# Patient Record
Sex: Female | Born: 1945
Health system: Southern US, Community
[De-identification: ages and names within clinical notes are randomized; demographics above are authoritative.]

## PROBLEM LIST (undated history)

## (undated) DIAGNOSIS — D099 Carcinoma in situ, unspecified: Secondary | ICD-10-CM

## (undated) DIAGNOSIS — R569 Unspecified convulsions: Secondary | ICD-10-CM

## (undated) DIAGNOSIS — D6800 Von Willebrand disease, unspecified: Secondary | ICD-10-CM

## (undated) DIAGNOSIS — R413 Other amnesia: Secondary | ICD-10-CM

## (undated) DIAGNOSIS — F419 Anxiety disorder, unspecified: Secondary | ICD-10-CM

## (undated) DIAGNOSIS — K219 Gastro-esophageal reflux disease without esophagitis: Secondary | ICD-10-CM

## (undated) DIAGNOSIS — M1990 Unspecified osteoarthritis, unspecified site: Secondary | ICD-10-CM

## (undated) DIAGNOSIS — M7541 Impingement syndrome of right shoulder: Secondary | ICD-10-CM

## (undated) DIAGNOSIS — E039 Hypothyroidism, unspecified: Secondary | ICD-10-CM

## (undated) DIAGNOSIS — E0590 Thyrotoxicosis, unspecified without thyrotoxic crisis or storm: Secondary | ICD-10-CM

## (undated) DIAGNOSIS — E785 Hyperlipidemia, unspecified: Secondary | ICD-10-CM

## (undated) DIAGNOSIS — R159 Full incontinence of feces: Secondary | ICD-10-CM

## (undated) DIAGNOSIS — M75101 Unspecified rotator cuff tear or rupture of right shoulder, not specified as traumatic: Secondary | ICD-10-CM

## (undated) DIAGNOSIS — F0390 Unspecified dementia without behavioral disturbance: Secondary | ICD-10-CM

## (undated) DIAGNOSIS — Z85828 Personal history of other malignant neoplasm of skin: Secondary | ICD-10-CM

## (undated) DIAGNOSIS — C4491 Basal cell carcinoma of skin, unspecified: Secondary | ICD-10-CM

## (undated) DIAGNOSIS — M75100 Unspecified rotator cuff tear or rupture of unspecified shoulder, not specified as traumatic: Secondary | ICD-10-CM

## (undated) DIAGNOSIS — I10 Essential (primary) hypertension: Secondary | ICD-10-CM

## (undated) DIAGNOSIS — R51 Headache: Secondary | ICD-10-CM

## (undated) DIAGNOSIS — Z98811 Dental restoration status: Secondary | ICD-10-CM

## (undated) DIAGNOSIS — R42 Dizziness and giddiness: Secondary | ICD-10-CM

## (undated) DIAGNOSIS — M549 Dorsalgia, unspecified: Secondary | ICD-10-CM

## (undated) DIAGNOSIS — M24119 Other articular cartilage disorders, unspecified shoulder: Secondary | ICD-10-CM

## (undated) HISTORY — DX: Hyperlipidemia, unspecified: E78.5

## (undated) HISTORY — DX: Essential (primary) hypertension: I10

## (undated) HISTORY — DX: Dorsalgia, unspecified: M54.9

## (undated) HISTORY — PX: ORIF WRIST FRACTURE: SHX2133

## (undated) HISTORY — PX: BREAST SURGERY: SHX581

## (undated) HISTORY — DX: Hypothyroidism, unspecified: E03.9

## (undated) HISTORY — DX: Carcinoma in situ, unspecified: D09.9

## (undated) HISTORY — DX: Basal cell carcinoma of skin, unspecified: C44.91

## (undated) HISTORY — PX: APPENDECTOMY: SHX54

## (undated) HISTORY — DX: Von Willebrand disease, unspecified: D68.00

## (undated) HISTORY — PX: COLONOSCOPY: SHX174

## (undated) HISTORY — DX: Dizziness and giddiness: R42

## (undated) HISTORY — DX: Unspecified dementia, unspecified severity, without behavioral disturbance, psychotic disturbance, mood disturbance, and anxiety: F03.90

## (undated) HISTORY — DX: Gastro-esophageal reflux disease without esophagitis: K21.9

## (undated) HISTORY — PX: TUBAL LIGATION: SHX77

## (undated) HISTORY — DX: Full incontinence of feces: R15.9

## (undated) HISTORY — DX: Unspecified osteoarthritis, unspecified site: M19.90

## (undated) HISTORY — DX: Unspecified convulsions: R56.9

## (undated) HISTORY — PX: FRACTURE SURGERY: SHX138

## (undated) HISTORY — DX: Von Willebrand's disease: D68.0

---

## 1997-12-14 ENCOUNTER — Emergency Department (HOSPITAL_COMMUNITY): Admission: EM | Admit: 1997-12-14 | Discharge: 1997-12-14 | Payer: Self-pay | Admitting: Emergency Medicine

## 1998-06-14 ENCOUNTER — Other Ambulatory Visit: Admission: RE | Admit: 1998-06-14 | Discharge: 1998-06-14 | Payer: Self-pay | Admitting: Obstetrics and Gynecology

## 1998-07-17 ENCOUNTER — Ambulatory Visit (HOSPITAL_BASED_OUTPATIENT_CLINIC_OR_DEPARTMENT_OTHER): Admission: RE | Admit: 1998-07-17 | Discharge: 1998-07-17 | Payer: Self-pay | Admitting: Surgery

## 1998-12-04 ENCOUNTER — Ambulatory Visit (HOSPITAL_COMMUNITY): Admission: RE | Admit: 1998-12-04 | Discharge: 1998-12-04 | Payer: Self-pay

## 1999-06-20 ENCOUNTER — Other Ambulatory Visit: Admission: RE | Admit: 1999-06-20 | Discharge: 1999-06-20 | Payer: Self-pay | Admitting: Obstetrics and Gynecology

## 1999-07-24 ENCOUNTER — Encounter: Admission: RE | Admit: 1999-07-24 | Discharge: 1999-07-24 | Payer: Self-pay | Admitting: Obstetrics and Gynecology

## 1999-07-24 ENCOUNTER — Encounter: Payer: Self-pay | Admitting: Obstetrics and Gynecology

## 2000-07-29 ENCOUNTER — Other Ambulatory Visit: Admission: RE | Admit: 2000-07-29 | Discharge: 2000-07-29 | Payer: Self-pay | Admitting: Obstetrics and Gynecology

## 2000-08-12 ENCOUNTER — Encounter: Payer: Self-pay | Admitting: Obstetrics and Gynecology

## 2000-08-12 ENCOUNTER — Encounter: Admission: RE | Admit: 2000-08-12 | Discharge: 2000-08-12 | Payer: Self-pay | Admitting: Obstetrics and Gynecology

## 2001-03-06 ENCOUNTER — Observation Stay (HOSPITAL_COMMUNITY): Admission: EM | Admit: 2001-03-06 | Discharge: 2001-03-06 | Payer: Self-pay

## 2001-03-06 ENCOUNTER — Encounter: Payer: Self-pay | Admitting: Cardiology

## 2001-03-06 HISTORY — PX: CARDIAC CATHETERIZATION: SHX172

## 2001-08-13 ENCOUNTER — Encounter: Payer: Self-pay | Admitting: Obstetrics and Gynecology

## 2001-08-13 ENCOUNTER — Encounter: Admission: RE | Admit: 2001-08-13 | Discharge: 2001-08-13 | Payer: Self-pay | Admitting: Obstetrics and Gynecology

## 2001-08-20 ENCOUNTER — Other Ambulatory Visit: Admission: RE | Admit: 2001-08-20 | Discharge: 2001-08-20 | Payer: Self-pay | Admitting: Obstetrics and Gynecology

## 2002-12-17 ENCOUNTER — Encounter: Payer: Self-pay | Admitting: Obstetrics and Gynecology

## 2002-12-17 ENCOUNTER — Encounter: Admission: RE | Admit: 2002-12-17 | Discharge: 2002-12-17 | Payer: Self-pay | Admitting: Obstetrics and Gynecology

## 2004-01-27 ENCOUNTER — Ambulatory Visit (HOSPITAL_COMMUNITY): Admission: RE | Admit: 2004-01-27 | Discharge: 2004-01-27 | Payer: Self-pay | Admitting: Obstetrics and Gynecology

## 2004-08-17 ENCOUNTER — Ambulatory Visit (HOSPITAL_COMMUNITY): Admission: RE | Admit: 2004-08-17 | Discharge: 2004-08-17 | Payer: Self-pay

## 2004-11-29 ENCOUNTER — Ambulatory Visit (HOSPITAL_COMMUNITY): Admission: RE | Admit: 2004-11-29 | Discharge: 2004-11-29 | Payer: Self-pay

## 2005-02-13 ENCOUNTER — Ambulatory Visit (HOSPITAL_COMMUNITY): Admission: RE | Admit: 2005-02-13 | Discharge: 2005-02-13 | Payer: Self-pay | Admitting: Obstetrics and Gynecology

## 2005-07-15 ENCOUNTER — Emergency Department (HOSPITAL_COMMUNITY): Admission: EM | Admit: 2005-07-15 | Discharge: 2005-07-15 | Payer: Self-pay | Admitting: Emergency Medicine

## 2005-12-20 ENCOUNTER — Ambulatory Visit: Payer: Self-pay | Admitting: Internal Medicine

## 2006-01-10 ENCOUNTER — Encounter (INDEPENDENT_AMBULATORY_CARE_PROVIDER_SITE_OTHER): Payer: Self-pay | Admitting: *Deleted

## 2006-01-10 ENCOUNTER — Ambulatory Visit: Payer: Self-pay | Admitting: Internal Medicine

## 2006-01-13 ENCOUNTER — Ambulatory Visit: Payer: Self-pay | Admitting: Internal Medicine

## 2006-04-15 ENCOUNTER — Ambulatory Visit (HOSPITAL_COMMUNITY): Admission: RE | Admit: 2006-04-15 | Discharge: 2006-04-15 | Payer: Self-pay | Admitting: Obstetrics and Gynecology

## 2006-12-30 ENCOUNTER — Ambulatory Visit (HOSPITAL_COMMUNITY): Admission: RE | Admit: 2006-12-30 | Discharge: 2006-12-30 | Payer: Self-pay

## 2007-05-06 ENCOUNTER — Ambulatory Visit (HOSPITAL_COMMUNITY): Admission: RE | Admit: 2007-05-06 | Discharge: 2007-05-06 | Payer: Self-pay | Admitting: Obstetrics and Gynecology

## 2007-06-10 ENCOUNTER — Encounter: Admission: RE | Admit: 2007-06-10 | Discharge: 2007-06-10 | Payer: Self-pay | Admitting: Family Medicine

## 2007-11-05 ENCOUNTER — Encounter: Admission: RE | Admit: 2007-11-05 | Discharge: 2007-11-05 | Payer: Self-pay | Admitting: Family Medicine

## 2008-04-29 ENCOUNTER — Ambulatory Visit (HOSPITAL_COMMUNITY): Admission: RE | Admit: 2008-04-29 | Discharge: 2008-04-29 | Payer: Self-pay | Admitting: Obstetrics and Gynecology

## 2008-07-29 ENCOUNTER — Encounter: Admission: RE | Admit: 2008-07-29 | Discharge: 2008-07-29 | Payer: Self-pay | Admitting: Sports Medicine

## 2009-01-10 ENCOUNTER — Encounter: Admission: RE | Admit: 2009-01-10 | Discharge: 2009-01-10 | Payer: Self-pay | Admitting: Sports Medicine

## 2009-01-12 ENCOUNTER — Encounter: Admission: RE | Admit: 2009-01-12 | Discharge: 2009-01-12 | Payer: Self-pay | Admitting: Sports Medicine

## 2009-01-25 ENCOUNTER — Encounter: Admission: RE | Admit: 2009-01-25 | Discharge: 2009-01-25 | Payer: Self-pay | Admitting: Neurological Surgery

## 2009-02-16 ENCOUNTER — Observation Stay (HOSPITAL_COMMUNITY): Admission: RE | Admit: 2009-02-16 | Discharge: 2009-02-16 | Payer: Self-pay | Admitting: Neurological Surgery

## 2009-02-22 ENCOUNTER — Ambulatory Visit: Payer: Self-pay | Admitting: Hematology & Oncology

## 2009-02-23 LAB — CBC WITH DIFFERENTIAL (CANCER CENTER ONLY)
BASO#: 0.1 10*3/uL (ref 0.0–0.2)
BASO%: 0.8 % (ref 0.0–2.0)
EOS%: 2.1 % (ref 0.0–7.0)
Eosinophils Absolute: 0.2 10*3/uL (ref 0.0–0.5)
HCT: 34.7 % — ABNORMAL LOW (ref 34.8–46.6)
HGB: 12.1 g/dL (ref 11.6–15.9)
LYMPH#: 2.5 10*3/uL (ref 0.9–3.3)
LYMPH%: 28.5 % (ref 14.0–48.0)
MCH: 28.1 pg (ref 26.0–34.0)
MCHC: 35 g/dL (ref 32.0–36.0)
MCV: 80 fL — ABNORMAL LOW (ref 81–101)
MONO#: 0.5 10*3/uL (ref 0.1–0.9)
MONO%: 5.3 % (ref 0.0–13.0)
NEUT#: 5.5 10*3/uL (ref 1.5–6.5)
NEUT%: 63.3 % (ref 39.6–80.0)
Platelets: 273 10*3/uL (ref 145–400)
RBC: 4.31 10*6/uL (ref 3.70–5.32)
RDW: 14.9 % — ABNORMAL HIGH (ref 10.5–14.6)
WBC: 8.7 10*3/uL (ref 3.9–10.0)

## 2009-02-23 LAB — IVY BLEEDING TIME - CHCC SATELLITE: Bleeding Time: 7 Minutes (ref 2.0–8.0)

## 2009-03-01 ENCOUNTER — Inpatient Hospital Stay (HOSPITAL_COMMUNITY): Admission: RE | Admit: 2009-03-01 | Discharge: 2009-03-02 | Payer: Self-pay | Admitting: Neurological Surgery

## 2009-03-01 HISTORY — PX: LUMBAR LAMINECTOMY/DECOMPRESSION MICRODISCECTOMY: SHX5026

## 2009-03-01 LAB — VON WILLEBRAND FACTOR MULTIMER
Factor-VIII Activity: 73 % (ref 50–180)
Ristocetin Co-Factor: 71 % (ref 42–200)
Von Willebrand Factor Ag: 105 % (ref 50–217)

## 2009-03-01 LAB — APTT: aPTT: 34 seconds (ref 24–37)

## 2009-04-25 ENCOUNTER — Encounter: Admission: RE | Admit: 2009-04-25 | Discharge: 2009-04-25 | Payer: Self-pay | Admitting: Neurological Surgery

## 2009-06-19 ENCOUNTER — Encounter: Admission: RE | Admit: 2009-06-19 | Discharge: 2009-06-19 | Payer: Self-pay | Admitting: Neurological Surgery

## 2009-10-02 ENCOUNTER — Encounter: Admission: RE | Admit: 2009-10-02 | Discharge: 2009-10-02 | Payer: Self-pay | Admitting: Neurological Surgery

## 2009-12-05 ENCOUNTER — Ambulatory Visit (HOSPITAL_COMMUNITY): Admission: RE | Admit: 2009-12-05 | Discharge: 2009-12-05 | Payer: Self-pay | Admitting: Obstetrics and Gynecology

## 2010-02-05 ENCOUNTER — Encounter: Admission: RE | Admit: 2010-02-05 | Discharge: 2010-02-05 | Payer: Self-pay | Admitting: Neurological Surgery

## 2010-03-19 ENCOUNTER — Encounter: Admission: RE | Admit: 2010-03-19 | Discharge: 2010-03-19 | Payer: Self-pay | Admitting: Neurological Surgery

## 2010-03-20 ENCOUNTER — Encounter: Admission: RE | Admit: 2010-03-20 | Discharge: 2010-03-20 | Payer: Self-pay | Admitting: Obstetrics and Gynecology

## 2010-06-17 ENCOUNTER — Encounter: Payer: Self-pay | Admitting: Obstetrics and Gynecology

## 2010-06-18 ENCOUNTER — Encounter: Payer: Self-pay | Admitting: Sports Medicine

## 2010-08-31 LAB — DIFFERENTIAL
Basophils Absolute: 0 10*3/uL (ref 0.0–0.1)
Basophils Relative: 0 % (ref 0–1)
Eosinophils Absolute: 0.1 10*3/uL (ref 0.0–0.7)
Eosinophils Relative: 2 % (ref 0–5)
Lymphocytes Relative: 31 % (ref 12–46)
Lymphs Abs: 2.5 10*3/uL (ref 0.7–4.0)
Monocytes Absolute: 0.5 10*3/uL (ref 0.1–1.0)
Monocytes Relative: 6 % (ref 3–12)
Neutro Abs: 5.1 10*3/uL (ref 1.7–7.7)
Neutrophils Relative %: 62 % (ref 43–77)

## 2010-08-31 LAB — GLUCOSE, CAPILLARY
Glucose-Capillary: 107 mg/dL — ABNORMAL HIGH (ref 70–99)
Glucose-Capillary: 114 mg/dL — ABNORMAL HIGH (ref 70–99)
Glucose-Capillary: 121 mg/dL — ABNORMAL HIGH (ref 70–99)
Glucose-Capillary: 151 mg/dL — ABNORMAL HIGH (ref 70–99)
Glucose-Capillary: 157 mg/dL — ABNORMAL HIGH (ref 70–99)

## 2010-08-31 LAB — BASIC METABOLIC PANEL
BUN: 11 mg/dL (ref 6–23)
BUN: 21 mg/dL (ref 6–23)
CO2: 28 mEq/L (ref 19–32)
CO2: 28 mEq/L (ref 19–32)
Calcium: 9.7 mg/dL (ref 8.4–10.5)
Calcium: 9.7 mg/dL (ref 8.4–10.5)
Chloride: 104 mEq/L (ref 96–112)
Chloride: 105 mEq/L (ref 96–112)
Creatinine, Ser: 0.64 mg/dL (ref 0.4–1.2)
Creatinine, Ser: 0.73 mg/dL (ref 0.4–1.2)
GFR calc Af Amer: >60 mL/min (ref >60)
GFR calc Af Amer: >60 mL/min (ref >60)
GFR calc non Af Amer: >60 mL/min (ref >60)
GFR calc non Af Amer: >60 mL/min (ref >60)
Glucose, Bld: 100 mg/dL — ABNORMAL HIGH (ref 70–99)
Glucose, Bld: 116 mg/dL — ABNORMAL HIGH (ref 70–99)
Potassium: 3.9 mEq/L (ref 3.5–5.1)
Potassium: 5 mEq/L (ref 3.5–5.1)
Sodium: 142 mEq/L (ref 135–145)
Sodium: 142 mEq/L (ref 135–145)

## 2010-08-31 LAB — CBC
HCT: 34.5 % — ABNORMAL LOW (ref 36.0–46.0)
HCT: 36.7 % (ref 36.0–46.0)
Hemoglobin: 11.8 g/dL — ABNORMAL LOW (ref 12.0–15.0)
Hemoglobin: 12.4 g/dL (ref 12.0–15.0)
MCHC: 33.8 g/dL (ref 30.0–36.0)
MCHC: 34.2 g/dL (ref 30.0–36.0)
MCV: 83.2 fL (ref 78.0–100.0)
MCV: 83.4 fL (ref 78.0–100.0)
Platelets: 222 10*3/uL (ref 150–400)
Platelets: 271 10*3/uL (ref 150–400)
RBC: 4.15 MIL/uL (ref 3.87–5.11)
RBC: 4.4 MIL/uL (ref 3.87–5.11)
RDW: 16.8 % — ABNORMAL HIGH (ref 11.5–15.5)
RDW: 16.9 % — ABNORMAL HIGH (ref 11.5–15.5)
WBC: 6.8 10*3/uL (ref 4.0–10.5)
WBC: 8.3 10*3/uL (ref 4.0–10.5)

## 2010-08-31 LAB — VON WILLEBRAND PANEL
Factor-VIII Activity: 65 % (ref 50–150)
Ristocetin-Cofactor: 66 % (ref 50–150)
Von Willebrand Ag: 98 % normal (ref 61–164)

## 2010-08-31 LAB — PLATELET FUNCTION ASSAY
Collagen / ADP: 187 seconds (ref 0–114)
Collagen / Epinephrine: 186 seconds (ref 0–184)

## 2010-08-31 LAB — CROSSMATCH
ABO/RH(D): A POS
Antibody Screen: NEGATIVE

## 2010-08-31 LAB — PROTIME-INR
INR: 0.9 (ref 0.00–1.49)
INR: 1.01 (ref 0.00–1.49)
Prothrombin Time: 12.3 seconds (ref 11.6–15.2)
Prothrombin Time: 13.2 seconds (ref 11.6–15.2)

## 2010-08-31 LAB — TYPE AND SCREEN
ABO/RH(D): A POS
Antibody Screen: NEGATIVE

## 2010-08-31 LAB — ABO/RH: ABO/RH(D): A POS

## 2010-08-31 LAB — APTT
aPTT: 30 seconds (ref 24–37)
aPTT: 32 seconds (ref 24–37)

## 2010-10-12 NOTE — Discharge Summary (Signed)
[place]. [facility]  Patient:    Stacy Davis, Stacy Davis Visit Number: [id number] MRN: [id number]          Service Type: MED Location: 1800 1828 01 Attending Physician:  Armanda Heritage Dictated by:   Abelino Derrick, P.A.C. Admit Date:  03/06/2001 Disc. Date: 03/06/01   CC:         Raliegh Ip, M.D., [address],  [place], [place] [address]  Madaline Savage, M.D.   Referring Physician Discharge [place]  DISCHARGE DIAGNOSES: 1. Chest pain, noncardiac in origin, with cardiac catheterization this    admission revealing normal coronaries and normal left ventricular function. 2. Gastroesophageal reflux. 3. Obesity and sleep apnea.  HISTORY OF PRESENT ILLNESS:  The patient is a 65 year old female followed by Raliegh Ip, M.D., who was actually scheduled to see Madaline Savage, M.D., as an outpatient for a stress Cardiolite study for chest pain. She presented to the emergency room on March 06, 2001, with chest pain and palpitations.  HOSPITAL COURSE:  She was seen by Madaline Savage, M.D., and it was decided to proceed with diagnostic cardiac catheterization.  This was done later on March 06, 2001, by Delrae Rend, M.D., and revealed normal coronaries, normal LV function, and normal aorta.  She was done under Perclose.  He feels that she can be discharged later on March 06, 2001, stable.  DISCHARGE MEDICATIONS:  Nexium as taken prior to admission.  Delrae Rend, M.D., suggests that she may want to hold off on the Celebrex as it may be causing some gastritis and reflux.  LABORATORY DATA:  White count 7.0, hemoglobin 12.2, hematocrit 35.2, platelet count 263.  CPK and troponin as noted.  Creatinine 0.5.  INR 1.0.  Sodium 141, potassium 3.2, BUN 8, creatinine 0.6.  Urinalysis unremarkable.  The EKG reveals sinus rhythm with nonspecific ST changes.  DISPOSITION:  The patient was admitted, started on heparin, and  underwent diagnostic catheterization, which showed normal coronaries and normal LV function.  She will be discharged later today as long as she has no problems. She can follow up with Raliegh Ip.  She has been instructed not to perform an strenuous activity for 48 hours because of her groin puncture site. Her right groin is without hematoma or ecchymosis at discharge. Dictated by:   Abelino Derrick, P.A.C. Attending Physician:  Armanda Heritage DD:  03/06/01 TD:  03/06/01 Job: [id number]

## 2010-10-12 NOTE — Procedures (Signed)
 HEALTHCARE                            ABDOMINAL ULTRASOUND REPORT   NAME:Stacy Davis, Stacy Davis                    MRN:          [id number]  DATE:01/14/2006                            DOB:          July 12, 1945    ACCESSION NUMBER:  [id number].   INDICATIONS FOR PROCEDURE:  Abdominal pain with nausea.   READING PHYSICIAN:  Hedwig Morton. Juanda Chance, MD.   PROCEDURE:  Multiplanar abdominal ultrasound imaging was performed in the  upright, supine, right and left lateral decubitus positions.   RESULTS:  Abdominal aorta 2.3 cm.  The IVC is patent.   The pancreas appears normal throughout the head, body and tail without  evidence of ductal dilatation, pancreatic masses, or peripancreatic  inflammation.   Gallbladder is well distended, gallbladder wall thickness 2.4 mm, with no  pericholecystic fluid or intraluminal echogenic foci to suggest gallstone  disease.   The common bile duct measures 2.7 mm in maximal diameter without evidence of  intraluminal foci.   The liver with severely increased echo density, no focal lesion no dilated  ducts, mild hepatomegaly.   Kidneys are normal in appearance.  Right kidney 7-8 mm, left 112 mm.   Spleen is normal in size, measuring 116 mm without parenchymal lesion.   IMPRESSION:  Mild hepatosplenomegaly with increased liver echo density  suggestive of liver disease.                                   Hedwig Morton. Juanda Chance, MD   DMB/MedQ  DD:  01/14/2006  DT:  01/15/2006  Job #:  [id number]

## 2010-10-12 NOTE — Discharge Summary (Signed)
[place]  Patient:    Stacy Davis, Stacy C. Visit Number: [id number] MRN: [id number]          Service Type: Attending:  Madaline Savage, M.D. Dictated by:   Abelino Derrick, P.A.C.                    Referring Physician Discharge [place]  ADDENDUM:  Ms. Yglesias has developed a significant headache post catheterization.  She had no localized neurologic findings.  She does have a history of migraines.  She had been put on heparin on admission and it was felt prudent to proceed with a head CT prior to discharge.  This revealed no evidence of hemorrhage or CVA.  She was discharged later that evening. Dictated by:   Abelino Derrick, P.A.C. Attending:  Madaline Savage, M.D. DD:  03/08/01 TD:  03/08/01 Job: [id number]

## 2010-10-12 NOTE — Assessment & Plan Note (Signed)
[facility]                           GASTROENTEROLOGY OFFICE NOTE   NAME:Casas, DALONDA SIMONI                    MRN:          [id number]  DATE:12/20/2005                            DOB:          March 13, 1946    GI CONSULTATION:  Ms. Ivanoff is a very nice 65 year old white female patient of Dr.  Vear Clock, also evaluated by Dr. Elsie Lincoln.  She is here today because of  persistent, recurrent chest pain, dizzy spells, nausea.  She has been to  emergency room on several occasions thinking she had a heart attack, but her  chest pain is not considered to be cardiac in origin although she does have  a history of ventricular tachycardia.  Associated with the chest pain is  sometimes nausea.  She has constant hiccups and she has postprandial  indigestion, a little belching.  Since she has made an appointment with me  her symptoms somewhat improved.  Upper GI series 10-15 years ago showed acid  reflux.  She has also bloating, epigastric fullness and weight gain, in the  last 3 years about 2 pounds since she started a sedentary job where she is  sitting at her desk.  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  She  cannot go without it.  There is a family history of inflammatory bowel  disease.  Her son has Crohn disease and her son has ulcerative colitis.  Her  father had liver disease.   MEDICATIONS:  1.  Protonix 40 mg p.o. daily.  2.  Toprol 50 mg p.o. daily.  3.  Celebrex 200 mg daily.  4.  Omacor 900 mg q.i.d.  5.  Folic acid.  6.  Magnesium.  7.  Calcium.  8.  Vitamin E.   PAST HISTORY:  1.  Heart rhythm problems in March 2007.  2.  Borderline diabetic.  3.  High cholesterol.  4.  Arthritic complaints 8-10 years.  5.  Sleep apnea.  6.  Chronic headaches.   OPERATIONS:  1.  Tubal ligation 1973.  2.  Breast surgery for benign disease in 2000.  3.  Appendectomy in  1973.   FAMILY HISTORY:  Heart disease in grandmother, prostate cancer in  grandfather.  Breast cancer, cousin and aunt.  Diabetes in cousin.  Alcoholism in aunt.   SOCIAL HISTORY:  The patient works as a Fish farm manager.  Has 12 years of  education.  She is married with 2 children.  Drinks alcohol very rarely but  does not smoke.   REVIEW OF SYSTEMS:  Weight gain of 20 pounds, frequent cough, frequent  urination, arthritic complaints, sleeping problems mostly due to cough,  excessive thirst, night sweats, vision changes, heart rhythm changes,  excessive urination and feeling of fatigue.   PHYSICAL EXAMINATION:  VITAL SIGNS:  Blood pressure 120/80, pulse 64 and  weight 198 pounds.  GENERAL:  The patient was clearly overweight, very pleasant, alert and  oriented and cooperative.  HEENT:  Sclerae are nonicteric.  Oral cavity was normal.  NECK:  Supple, no adenopathy.  LUNGS:  Clear to auscultation, no rales or wheezes.  CARDIAC:  Quiet precordium, normal S1 and normal S2.  ABDOMEN:  Soft and relaxed with minimal discomfort in epigastrium.  There  was also tenderness in the left lower and left middle quadrant but no  rebound and no fullness.  Bowel sounds were normoactive.  Right upper and  lower quadrants were unremarkable.  Liver edge was __________ at costal  margin.  RECTAL:  Normal rectal tone with Hemoccult-negative stool.   Laboratory data from Dr. Vear Clock' office showed TSH of 9.5 and normal  hemoglobin and hematocrit at 12.7 and 36.5.  Normal renal function.  Albumin  at 4.6 and normal liver function tests.   IMPRESSION:  51.  A 65 year old white female with recurrent chest pain and symptoms      pertaining to gastrointestinal tract, although hiccupping,      regurgitation, nausea suggest persistent gastroesophageal reflux, which      was demonstrated on remote upper GI series years ago.  She has gained      weight, and that may be contributing to worsening of her  reflux.  She      also has nocturnal cough, which suggests that she may have a      pharyngolaryngeal reflux as well.  This needs to be evaluated to rule      out the possibility of Barrett's esophagus or large hiatal hernia.  In      her symptoms there is nothing to suggest esophageal stricture.  2.  Symptoms of irritable bowel syndrome with bloating and left lower      quadrant discomfort.  This could be from irritable bowel syndrome versus      diverticulosis versus some of her medications, especially she is on some      supplements.  Also Celebrex can cause her GI symptoms such as diarrhea      or even gastritis.  There is no evidence of upper gastrointestinal blood      loss.  3.  Family history of inflammatory bowel disease in her son.  It probably is      genetically inherited from the patient's husband rather than from the      patient herself.   PLAN:  1.  Upper endoscopy.  2.  Increase Protonix to 40 mg p.o. b.i.d.  Samples given.  3.  Patient also is a good candidate for colonoscopy because of her age and      her GI symptoms.  4.  Weight loss.  5.  Depending on the results, she may also need an ultrasound of the      gallbladder to further evaluate her bloating.                                   Hedwig Morton. Juanda Chance, MD   DMB/MedQ  DD:  12/20/2005  DT:  12/20/2005  Job #:  [id number]   cc:   Loma Sender

## 2010-10-12 NOTE — Cardiovascular Report (Signed)
[place]. [facility]  Patient:    Stacy Davis, Stacy Davis Visit Number: [id number] MRN: [id number]          Service Type: MED Location: 1800 1828 01 Attending Physician:  Ophelia Shoulder Dictated by:   Delrae Rend, M.D. Proc. Date: 03/06/01 Admit Date:  03/06/2001 Discharge Date: 03/06/2001   CC:         Loma Sender, M.D., [place], [place]  [facility]  Madaline Savage, M.D.   Cardiac Catheterization  PRIMARY ATTENDING PHYSICIAN:  Loma Sender, M.D., [place], [place].  CARDIOLOGIST:  Delrae Rend, M.D.  PROCEDURES PERFORMED: 1. Selective left ventriculography. 2. Ascending aortogram. 3. Selective right and left coronary arteriography. 4. Right femoral arteriography. 5. Closure of right femoral artery access site using Perclose.  INDICATIONS:  The patient is a 65 year old female with past medical history of gastroesophageal reflux disease presents to the emergency room with symptoms very typical for angina. Given her age and her presentation to the emergency room, she was brought to the cardiac catheterization lab electively for coronary mapping.  HEMODYNAMIC DATA: 1. Left ventricular pressure of 115/12 with end diastolic  pressure of 18  mmHg. 2. The central aortic pressures were 114/72 with a mean of 92 mmHg. 3. There is no pressure gradient across the left ventricular aorta,    across the aortic valve.  LEFT VENTRICULOGRAPHY: 1. The left ventricular systolic function was normal. 2. The ejection fraction was calculated at 57%. 3. There is no mitral regurgitation noted.  CORONARY ARTERIOGRAPHY: 1. Right coronary artery:  The right coronary artery was a dominant    vessel. It bifurcates into the PLV and PDA branches. The PLV    branch is bigger than the two branches. It is a normal vessel. 2. Left main coronary artery:  The left main coronary artery is a large    caliber vessel and bifurcates  into left anterior descending coronary   artery and circumflex arteries. 3. Circumflex artery:  The circumflex artery is a nondominant vessel;    it is a large caliber vessel. The midsegment continues into a larger    first obtuse marginal branch and continues as a smaller circumflex.    The circumflex is normal. 4. Left anterior descending coronary artery:  The left anterior descending    coronary artery is a large caliber vessel and gives origin to a    moderate sized diagonal one and diagonal two vessels. It also gives    origin to several septal perforators. It wraps around the apex. It    is a normal vessel.  RIGHT FEMORAL ARTERIOGRAPHY:   They were good arterial accesses.  AORTIC ROOT ARTERIOGRAPHY: 1. The aortic root injection revealed no evidence of aortic dissection. 2. There is no evidence of significant atrial regurgitation. 3. The aortic valve had three aortic valve cusps and it appears to be    normal.  FINAL IMPRESSION: 1. Normal left ventricular systolic function. 2. Normal coronary arteries. 3. Normal aortic root.  RECOMMENDATIONS: 1. At this time the chest pain is probably of noncardiac etiology. Hence,    noncardiac causes of chest pain evaluation is indicated. 2. The patient will be discharged home.  PROCEDURE TECHNIQUE:  Under the usual sterile precautions using #6 French femoral artery access, #6 [place] multiple purposes PD catheters was advanced into the ascending aorta over a 0.035 mm guidewire. The catheter was gently advanced into the left ventricle and left ventricular pressures were monitored. Hand contrast injection of the left  ventricle was performed both in LAO and RAO projections. The catheter was flushed saline and pulled back into the ascending aorta and pressure gradient across the aortic valve was monitored. The right coronary artery was selectively engaged and arteriography was performed. In a similar fashion, the left main coronary artery  was selectively engaged and arteriography was performed. The catheter was pulled through the artery in the usual fashion. A right femoral arteriography was performed through the arterial access site. The arterial access was then closed with Perclose with excellent hemostasis obtained. The patient was transferred to short stay in a stable condition. The patient tolerated the procedure well. Dictated by:   Delrae Rend, M.D. Attending Physician:  Ophelia Shoulder DD:  03/06/01 TD:  03/07/01 Job: [phone] [id number]

## 2011-03-21 ENCOUNTER — Other Ambulatory Visit: Payer: Self-pay | Admitting: Obstetrics and Gynecology

## 2011-06-06 ENCOUNTER — Other Ambulatory Visit (HOSPITAL_COMMUNITY): Payer: Self-pay | Admitting: Sports Medicine

## 2011-06-06 DIAGNOSIS — M75101 Unspecified rotator cuff tear or rupture of right shoulder, not specified as traumatic: Secondary | ICD-10-CM

## 2011-06-10 ENCOUNTER — Other Ambulatory Visit (HOSPITAL_COMMUNITY): Payer: Self-pay

## 2011-06-17 ENCOUNTER — Other Ambulatory Visit (HOSPITAL_COMMUNITY): Payer: Self-pay

## 2011-07-02 ENCOUNTER — Other Ambulatory Visit (HOSPITAL_COMMUNITY): Payer: Self-pay

## 2011-07-16 ENCOUNTER — Encounter: Payer: Self-pay | Admitting: Family Medicine

## 2011-07-16 ENCOUNTER — Ambulatory Visit (INDEPENDENT_AMBULATORY_CARE_PROVIDER_SITE_OTHER): Payer: MEDICARE | Admitting: Family Medicine

## 2011-07-16 DIAGNOSIS — E119 Type 2 diabetes mellitus without complications: Secondary | ICD-10-CM

## 2011-07-16 DIAGNOSIS — D680 Von Willebrand's disease: Secondary | ICD-10-CM

## 2011-07-16 DIAGNOSIS — I471 Supraventricular tachycardia: Secondary | ICD-10-CM

## 2011-07-16 DIAGNOSIS — R413 Other amnesia: Secondary | ICD-10-CM

## 2011-07-16 DIAGNOSIS — I10 Essential (primary) hypertension: Secondary | ICD-10-CM

## 2011-07-16 DIAGNOSIS — E039 Hypothyroidism, unspecified: Secondary | ICD-10-CM

## 2011-07-16 DIAGNOSIS — E785 Hyperlipidemia, unspecified: Secondary | ICD-10-CM

## 2011-07-16 DIAGNOSIS — M549 Dorsalgia, unspecified: Secondary | ICD-10-CM

## 2011-07-16 DIAGNOSIS — K219 Gastro-esophageal reflux disease without esophagitis: Secondary | ICD-10-CM

## 2011-07-16 DIAGNOSIS — I498 Other specified cardiac arrhythmias: Secondary | ICD-10-CM

## 2011-07-16 NOTE — Patient Instructions (Signed)
Don't change your meds for now.   I'll check the old records and then notify you about labs and when to come back.   Let me know if your right ear doesn't get better.   Take care. Glad to see you.

## 2011-07-16 NOTE — Progress Notes (Signed)
New patient.    R ear pain, on the pinna, no trauma.   Hypothyroid, due for labs. Compliant with meds.  No neck pain, mass.   Elevated Cholesterol: H/o statin intolerance Off statin now.  Diet compliance:yes Exercise:some  Diabetes:  Using medications without difficulties:yes Hypoglycemic episodes:no Hyperglycemic episodes:no Feet problems:no  FH dementia and pt, husband noted memory changes.  Had trouble keeping meds straight, occ needs help with finances.    Back pain, longstanding, controlled on current meds w/o ADE.  Rare opiate use.  Known DDD from prev.  Pain in lower back, often sleeps in recliner.   PMH and SH reviewed  Meds, vitals, and allergies reviewed.   ROS: See HPI.  Otherwise negative.    GEN: nad, alert and oriented HEENT: mucous membranes moist, normal ear exam x2 NECK: supple w/o LA CV: rrr. PULM: ctab, no inc wob ABD: soft, +bs EXT: no edema SKIN: no acute rash

## 2011-07-18 ENCOUNTER — Telehealth: Payer: Self-pay | Admitting: Family Medicine

## 2011-07-18 ENCOUNTER — Encounter: Payer: Self-pay | Admitting: Family Medicine

## 2011-07-18 DIAGNOSIS — K219 Gastro-esophageal reflux disease without esophagitis: Secondary | ICD-10-CM | POA: Insufficient documentation

## 2011-07-18 DIAGNOSIS — M549 Dorsalgia, unspecified: Secondary | ICD-10-CM | POA: Insufficient documentation

## 2011-07-18 DIAGNOSIS — I471 Supraventricular tachycardia, unspecified: Secondary | ICD-10-CM | POA: Insufficient documentation

## 2011-07-18 DIAGNOSIS — R413 Other amnesia: Secondary | ICD-10-CM | POA: Insufficient documentation

## 2011-07-18 DIAGNOSIS — D680 Von Willebrand's disease: Secondary | ICD-10-CM | POA: Insufficient documentation

## 2011-07-18 DIAGNOSIS — E785 Hyperlipidemia, unspecified: Secondary | ICD-10-CM | POA: Insufficient documentation

## 2011-07-18 DIAGNOSIS — I10 Essential (primary) hypertension: Secondary | ICD-10-CM | POA: Insufficient documentation

## 2011-07-18 DIAGNOSIS — K760 Fatty (change of) liver, not elsewhere classified: Secondary | ICD-10-CM | POA: Insufficient documentation

## 2011-07-18 DIAGNOSIS — R809 Proteinuria, unspecified: Secondary | ICD-10-CM | POA: Insufficient documentation

## 2011-07-18 DIAGNOSIS — E039 Hypothyroidism, unspecified: Secondary | ICD-10-CM | POA: Insufficient documentation

## 2011-07-18 DIAGNOSIS — E1129 Type 2 diabetes mellitus with other diabetic kidney complication: Secondary | ICD-10-CM | POA: Insufficient documentation

## 2011-07-18 NOTE — Telephone Encounter (Signed)
Fasting

## 2011-07-18 NOTE — Assessment & Plan Note (Addendum)
Return for labs then OV to test MMSE.  >45 min spent with face to face with patient, >50% counseling and/or coordinating care in total

## 2011-07-18 NOTE — Assessment & Plan Note (Signed)
Cont current meds.

## 2011-07-18 NOTE — Telephone Encounter (Signed)
Call pt.  Needs to come in for labs, orders are in.  Set up OV a few days after that, so we can discuss and check her memory in detail. Thanks.  visit if possible.

## 2011-07-18 NOTE — Assessment & Plan Note (Signed)
Return for labs.  

## 2011-07-18 NOTE — Assessment & Plan Note (Signed)
Return for labs, with OV after labs.

## 2011-07-18 NOTE — Telephone Encounter (Signed)
LMOVM of patient.

## 2011-07-18 NOTE — Telephone Encounter (Signed)
Appts made.  Does she need to be fasting for the labs?

## 2011-07-18 NOTE — Assessment & Plan Note (Signed)
No change in meds.

## 2011-07-22 ENCOUNTER — Other Ambulatory Visit: Payer: Self-pay | Admitting: *Deleted

## 2011-07-22 MED ORDER — METFORMIN HCL 500 MG PO TABS: 500.0000 mg | ORAL_TABLET | Freq: Two times a day (BID) | ORAL | Status: DC

## 2011-07-23 ENCOUNTER — Other Ambulatory Visit (INDEPENDENT_AMBULATORY_CARE_PROVIDER_SITE_OTHER): Payer: MEDICARE

## 2011-07-23 ENCOUNTER — Other Ambulatory Visit: Payer: Self-pay | Admitting: *Deleted

## 2011-07-23 DIAGNOSIS — R413 Other amnesia: Secondary | ICD-10-CM

## 2011-07-23 DIAGNOSIS — E119 Type 2 diabetes mellitus without complications: Secondary | ICD-10-CM

## 2011-07-23 LAB — COMPREHENSIVE METABOLIC PANEL
ALT: 26 U/L (ref 0–35)
AST: 36 U/L (ref 0–37)
Albumin: 4.4 g/dL (ref 3.5–5.2)
Alkaline Phosphatase: 68 U/L (ref 39–117)
BUN: 16 mg/dL (ref 6–23)
CO2: 28 mEq/L (ref 19–32)
Calcium: 9.5 mg/dL (ref 8.4–10.5)
Chloride: 103 mEq/L (ref 96–112)
Creatinine, Ser: 0.8 mg/dL (ref 0.4–1.2)
GFR: 82.63 mL/min (ref >60.00)
Glucose, Bld: 91 mg/dL (ref 70–99)
Potassium: 4.3 mEq/L (ref 3.5–5.1)
Sodium: 138 mEq/L (ref 135–145)
Total Bilirubin: 0.3 mg/dL (ref 0.3–1.2)
Total Protein: 7 g/dL (ref 6.0–8.3)

## 2011-07-23 LAB — CBC WITH DIFFERENTIAL/PLATELET
Basophils Absolute: 0 10*3/uL (ref 0.0–0.1)
Basophils Relative: 0.5 % (ref 0.0–3.0)
Eosinophils Absolute: 0.1 10*3/uL (ref 0.0–0.7)
Eosinophils Relative: 2.4 % (ref 0.0–5.0)
HCT: 35.1 % — ABNORMAL LOW (ref 36.0–46.0)
Hemoglobin: 11.8 g/dL — ABNORMAL LOW (ref 12.0–15.0)
Lymphocytes Relative: 44.7 % (ref 12.0–46.0)
Lymphs Abs: 2.6 10*3/uL (ref 0.7–4.0)
MCHC: 33.5 g/dL (ref 30.0–36.0)
MCV: 83.8 fl (ref 78.0–100.0)
Monocytes Absolute: 0.4 10*3/uL (ref 0.1–1.0)
Monocytes Relative: 6.5 % (ref 3.0–12.0)
Neutro Abs: 2.6 10*3/uL (ref 1.4–7.7)
Neutrophils Relative %: 45.9 % (ref 43.0–77.0)
Platelets: 199 10*3/uL (ref 150.0–400.0)
RBC: 4.19 Mil/uL (ref 3.87–5.11)
RDW: 14.6 % (ref 11.5–14.6)
WBC: 5.7 10*3/uL (ref 4.5–10.5)

## 2011-07-23 LAB — LIPID PANEL
Cholesterol: 299 mg/dL — ABNORMAL HIGH (ref 0–200)
HDL: 41.9 mg/dL (ref >39.00)
Total CHOL/HDL Ratio: 7
Triglycerides: 903 mg/dL — ABNORMAL HIGH (ref 0.0–149.0)
VLDL: 180.6 mg/dL — ABNORMAL HIGH (ref 0.0–40.0)

## 2011-07-23 LAB — MICROALBUMIN / CREATININE URINE RATIO
Creatinine,U: 53.3 mg/dL
Microalb Creat Ratio: 0.9 mg/g (ref 0.0–30.0)
Microalb, Ur: 0.5 mg/dL (ref 0.0–1.9)

## 2011-07-23 LAB — VITAMIN B12: Vitamin B-12: 401 pg/mL (ref 211–911)

## 2011-07-23 LAB — LDL CHOLESTEROL, DIRECT: Direct LDL: 97.5 mg/dL

## 2011-07-23 LAB — TSH: TSH: 3.02 u[IU]/mL (ref 0.35–5.50)

## 2011-07-23 LAB — HEMOGLOBIN A1C: Hgb A1c MFr Bld: 6.2 % (ref 4.6–6.5)

## 2011-07-23 MED ORDER — METOPROLOL TARTRATE 25 MG PO TABS: 25.0000 mg | ORAL_TABLET | Freq: Two times a day (BID) | ORAL | Status: DC

## 2011-07-23 MED ORDER — MELOXICAM 7.5 MG PO TABS: 7.5000 mg | ORAL_TABLET | Freq: Two times a day (BID) | ORAL | Status: DC

## 2011-07-23 NOTE — Telephone Encounter (Signed)
Sent!

## 2011-07-24 LAB — FOLATE: Folate: >24.8 ng/mL (ref >5.9)

## 2011-07-25 ENCOUNTER — Ambulatory Visit (INDEPENDENT_AMBULATORY_CARE_PROVIDER_SITE_OTHER): Payer: MEDICARE | Admitting: Family Medicine

## 2011-07-25 ENCOUNTER — Encounter: Payer: Self-pay | Admitting: Family Medicine

## 2011-07-25 DIAGNOSIS — R413 Other amnesia: Secondary | ICD-10-CM

## 2011-07-25 NOTE — Progress Notes (Signed)
Memory changes since 2010, worse in the meantime.  More trouble with meds/doses, misplaced some items.  Husband noted short term changes, ie asking if she took her morning pills after she had already taken them.  No h/o CVA.  Some days are better than others.  She never had brain imaging.  Sugar has been controlled.  Sleep is disrupted due to pain.  This could be med related.   Recent labs d/w pt in detail.   Meds, vitals, and allergies reviewed.   ROS: See HPI.  Otherwise, noncontributory.  nad ncat Mmm rrr ctab  abd soft, not ttp CN 2-12 wnl B, S/S/DTR wnl x4 MMSE 28/30, -1 for day of the month and -1 for drawing figure

## 2011-07-25 NOTE — Patient Instructions (Signed)
Stop tramadol for 1 week, use tylenol instead (1-2 extra strength tylenol up to 3 times a day). Call me with an update in about 1 week or 10 days.   We'll set up the CT if you aren't better.

## 2011-07-26 ENCOUNTER — Telehealth: Payer: Self-pay | Admitting: *Deleted

## 2011-07-26 ENCOUNTER — Encounter: Payer: Self-pay | Admitting: Family Medicine

## 2011-07-26 NOTE — Assessment & Plan Note (Signed)
>  25 min spent with face to face with patient, >50% counseling and/or coordinating care This could be med related.   Unlikely to be pseudodementia from depression.  Stop tramadol for 1 week, use tylenol instead. Get head CT if not improved.   She agrees.   Unlikely to have profound cognitive dysfunction with MMSE 28/30.   No obvious source on labs noted.

## 2011-07-26 NOTE — Telephone Encounter (Signed)
Patient advised.

## 2011-07-26 NOTE — Telephone Encounter (Signed)
Stop the tramadol totally for now.  Thanks.

## 2011-07-26 NOTE — Telephone Encounter (Signed)
Patient called to let Dr. Para March know that she does not take Tramadol on a regular basis, she has only been taking it as needed which hasn't been very often.  She switched to Flexeril per your request to see if that would help with some of her symptoms.  Should she stop using the Tramadol altogether?  Please advise.

## 2011-08-06 ENCOUNTER — Telehealth: Payer: Self-pay | Admitting: *Deleted

## 2011-08-06 NOTE — Telephone Encounter (Signed)
Patient says to let you know that she did stop the Tramadol and her memory is some better.

## 2011-08-07 NOTE — Telephone Encounter (Signed)
If she is better, then no need to w/u further.  Continue as is for now, off the tramadol.  Listed as intolerance.  OV if pain increases so we can discuss options other than tramadol.  Thanks.

## 2011-08-07 NOTE — Telephone Encounter (Signed)
Left message on machine to call back  

## 2011-08-12 ENCOUNTER — Ambulatory Visit: Payer: MEDICARE | Admitting: Family Medicine

## 2011-08-12 NOTE — Telephone Encounter (Signed)
Patient says she thought her memory was some better when she stopped the Tramadol but now she's not so sure.  She is worried that it might be Alzheimer's.  I suggested that she make an appt to come in to discuss it.  She was transferred to scheduling to make that appt.

## 2011-08-13 ENCOUNTER — Encounter: Payer: Self-pay | Admitting: Family Medicine

## 2011-08-13 ENCOUNTER — Ambulatory Visit (INDEPENDENT_AMBULATORY_CARE_PROVIDER_SITE_OTHER): Payer: MEDICARE | Admitting: Family Medicine

## 2011-08-13 VITALS — BP 130/86 | HR 72 | Temp 98.0°F | Wt 166.0 lb

## 2011-08-13 DIAGNOSIS — G4700 Insomnia, unspecified: Secondary | ICD-10-CM

## 2011-08-13 DIAGNOSIS — R413 Other amnesia: Secondary | ICD-10-CM

## 2011-08-13 DIAGNOSIS — E119 Type 2 diabetes mellitus without complications: Secondary | ICD-10-CM

## 2011-08-13 MED ORDER — ZOLPIDEM TARTRATE 10 MG PO TABS: 5.0000 mg | ORAL_TABLET | Freq: Every evening | ORAL | Status: DC | PRN

## 2011-08-13 MED ORDER — METFORMIN HCL 500 MG PO TABS: 500.0000 mg | ORAL_TABLET | Freq: Every day | ORAL | Status: DC

## 2011-08-13 NOTE — Patient Instructions (Signed)
See Shirlee Limerick about your referral before you leave today. We'll contact you with your CT report. Cut back to 1 metformin and see if that helps the diarrhea.

## 2011-08-13 NOTE — Progress Notes (Signed)
She may still have had some troubles with memory.  She's off tramadol and that may have helped some initially, but then she still had some confusion related to some activities (ie picking shingle colors for their roof).  She's still reading a lot.  FH of dementia.    Her pain is controlled off the tramadol.  She does have some R ear pain.    She has occ diarrhea.  She didn't know if it was metformin related.  Nonbloody.  Not vomiting. No fevers.    Needs refill on ambien.  Not groggy with use, sleep is disrupted w/o it o/w.   PMH and SH reviewed  ROS: See HPI, otherwise noncontributory.  Meds, vitals, and allergies reviewed.   nad  ncat  Mmm  rrr  ctab  abd soft, not ttp  CN 2-12 wnl B, S/S/DTR wnl x4  MMSE 30/30

## 2011-08-14 ENCOUNTER — Ambulatory Visit (INDEPENDENT_AMBULATORY_CARE_PROVIDER_SITE_OTHER)
Admission: RE | Admit: 2011-08-14 | Discharge: 2011-08-14 | Disposition: A | Discharge status: Home / Self Care | Payer: Medicare Other | Source: Ambulatory Visit | Attending: Family Medicine | Admitting: Family Medicine

## 2011-08-14 DIAGNOSIS — R413 Other amnesia: Secondary | ICD-10-CM

## 2011-08-15 NOTE — Assessment & Plan Note (Signed)
MMSE 30/30 but still with sx at home per patient husband with FH of dementia.  Will check CT and then consider refer to neuro.  Can follow clinically.  This could be med related but I wouldn't change other meds given her other overlapping conditions.  She agrees with plan.  >25 min spent with face to face with patient, >50% counseling and/or coordinating care.

## 2011-08-15 NOTE — Assessment & Plan Note (Signed)
Decrease metformin and see if that helps diarrhea.  Recheck A1c later in 2013.

## 2011-08-16 ENCOUNTER — Other Ambulatory Visit: Payer: Self-pay | Admitting: Family Medicine

## 2011-08-16 DIAGNOSIS — E119 Type 2 diabetes mellitus without complications: Secondary | ICD-10-CM

## 2011-11-18 ENCOUNTER — Other Ambulatory Visit (INDEPENDENT_AMBULATORY_CARE_PROVIDER_SITE_OTHER): Payer: Medicare Other

## 2011-11-18 DIAGNOSIS — E119 Type 2 diabetes mellitus without complications: Secondary | ICD-10-CM

## 2011-11-18 LAB — HEMOGLOBIN A1C: Hgb A1c MFr Bld: 5.9 % (ref 4.6–6.5)

## 2011-11-20 ENCOUNTER — Encounter: Payer: Self-pay | Admitting: Family Medicine

## 2011-11-20 ENCOUNTER — Ambulatory Visit (INDEPENDENT_AMBULATORY_CARE_PROVIDER_SITE_OTHER): Payer: Medicare Other | Admitting: Family Medicine

## 2011-11-20 VITALS — BP 126/78 | HR 80 | Temp 98.6°F | Wt 166.0 lb

## 2011-11-20 DIAGNOSIS — E119 Type 2 diabetes mellitus without complications: Secondary | ICD-10-CM

## 2011-11-20 DIAGNOSIS — R413 Other amnesia: Secondary | ICD-10-CM

## 2011-11-20 NOTE — Assessment & Plan Note (Signed)
Controlled by A1c, continue current meds.   Recheck later in 2013.  ACE not started as patient is not hypertensive and I don't want to induce hypotension.

## 2011-11-20 NOTE — Patient Instructions (Addendum)
See Shirlee Limerick about your referral before you leave today. Don't change your meds.  Recheck labs in 3-4 months before a visit with Para March.

## 2011-11-20 NOTE — Progress Notes (Signed)
Memory changes.  "this is beyond what I would expect for my age."  More trouble with short term memory rather than longer term memory.  FH dementia, with her sister and mother.  Others have noted it.  She has to ask her husband about taking her pills, etc.  Prev with 30/30 on MMSE and no acute changes on CT brain.  Current pain meds taking episodically and no clear affect on memory noted.  She'll have a faint tremor in her hands occ, at rest and with activity.    Diabetes:  Using medications without difficulties: she concerned that she may be forgetting her meds occ.  See above.  Hypoglycemic episodes: only if prolonged fasting Hyperglycemic episodes:no Feet problems:no Blood Sugars averaging: 120-130s A1c 5.9.  D/w pt.   PMH and SH reviewed  Meds, vitals, and allergies reviewed.   ROS: See HPI.  Otherwise negative.    GEN: nad, alert and oriented HEENT: mucous membranes moist NECK: supple w/o LA CV: rrr. PULM: ctab, no inc wob ABD: soft, +bs EXT: no edema SKIN: no acute rash CN 2-12 wnl B, S/S/DTR wnl x4, faint tremor on finger to nose testing, L>R hand.   No pronator drift speech fluent   Diabetic foot exam: Normal inspection No skin breakdown No calluses  Normal DP pulses Normal sensation to light touch and monofilament Nails normal

## 2011-11-20 NOTE — Assessment & Plan Note (Signed)
It doesn't appear to be from BB or from pain meds.  +FH dementia.  I would like neuro input at this point.  We didn't start meds today.  I don't know if the tremor is related.  She doesn't have overt parkinsonian findings.  App neuro input. >25 min spent with face to face with patient, >50% counseling and/or coordinating care.

## 2011-12-04 ENCOUNTER — Encounter: Payer: Self-pay | Admitting: Family Medicine

## 2012-02-20 ENCOUNTER — Other Ambulatory Visit: Payer: Self-pay | Admitting: *Deleted

## 2012-02-20 NOTE — Telephone Encounter (Signed)
pt left v.m requesting refill on ambien to [facility]. Pt going out of town for 3-4 weeks.Please advise.

## 2012-02-21 MED ORDER — ZOLPIDEM TARTRATE 10 MG PO TABS: 5.0000 mg | ORAL_TABLET | Freq: Every evening | ORAL | Status: DC | PRN

## 2012-02-21 NOTE — Telephone Encounter (Signed)
Please call in

## 2012-02-21 NOTE — Telephone Encounter (Signed)
Medication phoned to pharmacy.  

## 2012-04-02 ENCOUNTER — Telehealth: Payer: Self-pay | Admitting: *Deleted

## 2012-04-02 NOTE — Telephone Encounter (Signed)
Received a fax from Sherri with [facility] requesting a pre-operative clearance for a "major orthopaedic procedure". Dr Myna Hidalgo hasn't seen the pt since 2010. Left a message with Sherri asking her to revise the form to include what major orthopaedic procedure she is having. This will make a difference in his recommendation.

## 2012-04-10 ENCOUNTER — Other Ambulatory Visit: Payer: Self-pay | Admitting: *Deleted

## 2012-04-10 MED ORDER — LEVOTHYROXINE SODIUM 88 MCG PO TABS: 88.0000 ug | ORAL_TABLET | Freq: Every day | ORAL | Status: DC

## 2012-04-26 DIAGNOSIS — M24119 Other articular cartilage disorders, unspecified shoulder: Secondary | ICD-10-CM

## 2012-04-26 DIAGNOSIS — M7541 Impingement syndrome of right shoulder: Secondary | ICD-10-CM

## 2012-04-26 DIAGNOSIS — M75100 Unspecified rotator cuff tear or rupture of unspecified shoulder, not specified as traumatic: Secondary | ICD-10-CM

## 2012-04-26 HISTORY — DX: Unspecified rotator cuff tear or rupture of unspecified shoulder, not specified as traumatic: M75.100

## 2012-04-26 HISTORY — DX: Other articular cartilage disorders, unspecified shoulder: M24.119

## 2012-04-26 HISTORY — DX: Impingement syndrome of right shoulder: M75.41

## 2012-05-01 ENCOUNTER — Encounter: Payer: Self-pay | Admitting: Family Medicine

## 2012-05-01 ENCOUNTER — Ambulatory Visit (INDEPENDENT_AMBULATORY_CARE_PROVIDER_SITE_OTHER): Payer: Medicare Other | Admitting: Family Medicine

## 2012-05-01 VITALS — BP 122/82 | HR 60 | Temp 98.4°F | Wt 162.0 lb

## 2012-05-01 DIAGNOSIS — M25519 Pain in unspecified shoulder: Secondary | ICD-10-CM

## 2012-05-01 DIAGNOSIS — E119 Type 2 diabetes mellitus without complications: Secondary | ICD-10-CM

## 2012-05-01 DIAGNOSIS — I10 Essential (primary) hypertension: Secondary | ICD-10-CM

## 2012-05-01 LAB — HEMOGLOBIN A1C
Hgb A1c MFr Bld: 5.9 % — ABNORMAL HIGH (ref <5.7)
Mean Plasma Glucose: 123 mg/dL — ABNORMAL HIGH (ref <117)

## 2012-05-01 NOTE — Progress Notes (Signed)
Planned for shoulder surgery, R shoulder,  Dr. Dion Saucier.  Here for pre op eval.    R shoulder pain continues. Affecting sleeping, daily activity.  Bothersome to patient.   Memory tends to fluctuate, some days are better than others.  Worse the day after a night w/o good sleep.  She has been forgetting details of household chores.  Husband and pt agree about the recent changes.  She isn't taking hydrocodone/ambien consistently.  Sleep is disrupted by R shoulder pain.    H/o controlled HTN, DM2 controlled.   No H/o CVA, CAD, stent, CABG.   No CP, SOB, BLE edema.   PMH and SH reviewed  ROS: See HPI, otherwise noncontributory.  Meds, vitals, and allergies reviewed.   GEN: nad, alert and oriented HEENT: mucous membranes moist NECK: supple w/o LA CV: rrr PULM: ctab, no inc wob ABD: soft, +bs EXT: no edema SKIN: no acute rash

## 2012-05-01 NOTE — Patient Instructions (Addendum)
We'll contact you with your lab report. I'll talk to ortho in the meantime.   Call neuro and tell them that your memory seems to be worse.

## 2012-05-03 ENCOUNTER — Encounter: Payer: Self-pay | Admitting: Family Medicine

## 2012-05-03 DIAGNOSIS — M25519 Pain in unspecified shoulder: Secondary | ICD-10-CM | POA: Insufficient documentation

## 2012-05-03 NOTE — Assessment & Plan Note (Addendum)
I'll talk with ortho.  Pt/husband know to call neuro about the worsening memory changes they report today.  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  She is concerned about memory changes after surgery/with anesthesia.   We talked about risk of surgery.  I cannot "clear" a patient but it appears that she would be appropriately low risk for surgery.  I'll d/w ortho- see the the following phone note when I can talk with ortho.  I'll ask about regional blocks.   Even if she wasn't having surgery planned, I want her to notify the neuro clinic.  She and husband understood.  >25 min spent with face to face with patient, >50% counseling and/or coordinating care  I called Dr. Shelba Flake office.  He is out of clinic today and tomorrow.  I left message asking for him to call me back on 05/06/12.

## 2012-05-05 ENCOUNTER — Telehealth: Payer: Self-pay | Admitting: Family Medicine

## 2012-05-05 NOTE — Telephone Encounter (Signed)
I talked with Dr. Dion Saucier.  He is aware of VWD.  I discussed the memory concerns.  He can perform the surgery with lighter anesthesia and a regional block.  This is reasonable.  It is still reasonable for pt to discuss with neuro, but at this point she is bothered enough by the shoulder pain that it is reasonable to proceed with surgery with regional block.  She appears to be appropriately low risk for surgery.

## 2012-05-06 ENCOUNTER — Other Ambulatory Visit: Payer: Self-pay | Admitting: *Deleted

## 2012-05-06 MED ORDER — PANTOPRAZOLE SODIUM 40 MG PO TBEC: 40.0000 mg | DELAYED_RELEASE_TABLET | Freq: Two times a day (BID) | ORAL | Status: DC

## 2012-05-11 ENCOUNTER — Telehealth: Payer: Self-pay

## 2012-05-11 MED ORDER — LORAZEPAM 0.5 MG PO TABS: 0.5000 mg | ORAL_TABLET | Freq: Three times a day (TID) | ORAL | Status: DC | PRN

## 2012-05-11 NOTE — Telephone Encounter (Signed)
Rx phoned to pharmacy. Patient advised.

## 2012-05-11 NOTE — Telephone Encounter (Signed)
Pt left v/m having shoulder surgery on 05/15/12; pt request med to help nervousness and anxiousness for 1 week until has surgery.Please advise.[employer].

## 2012-05-11 NOTE — Telephone Encounter (Signed)
Can take ativan prn.  Sedation caution, esp with her other meds.  Please call in.

## 2012-05-13 ENCOUNTER — Encounter (HOSPITAL_BASED_OUTPATIENT_CLINIC_OR_DEPARTMENT_OTHER)
Admission: RE | Admit: 2012-05-13 | Discharge: 2012-05-13 | Disposition: A | Discharge status: Home / Self Care | Payer: Medicare Other | Source: Ambulatory Visit | Attending: Orthopedic Surgery | Admitting: Orthopedic Surgery

## 2012-05-13 ENCOUNTER — Encounter (HOSPITAL_BASED_OUTPATIENT_CLINIC_OR_DEPARTMENT_OTHER): Payer: Self-pay | Admitting: *Deleted

## 2012-05-13 ENCOUNTER — Other Ambulatory Visit: Payer: Self-pay

## 2012-05-13 LAB — BASIC METABOLIC PANEL
BUN: 15 mg/dL (ref 6–23)
CO2: 22 mEq/L (ref 19–32)
Calcium: 9.8 mg/dL (ref 8.4–10.5)
Chloride: 101 mEq/L (ref 96–112)
Creatinine, Ser: 0.76 mg/dL (ref 0.50–1.10)
GFR calc Af Amer: >90 mL/min (ref >90)
GFR calc non Af Amer: 87 mL/min — ABNORMAL LOW (ref >90)
Glucose, Bld: 98 mg/dL (ref 70–99)
Potassium: 4.3 mEq/L (ref 3.5–5.1)
Sodium: 138 mEq/L (ref 135–145)

## 2012-05-13 MED ORDER — LORAZEPAM 0.5 MG PO TABS: 0.5000 mg | ORAL_TABLET | Freq: Three times a day (TID) | ORAL | Status: DC | PRN

## 2012-05-13 NOTE — Pre-Procedure Instructions (Signed)
To come for BMET 

## 2012-05-13 NOTE — Telephone Encounter (Signed)
Pharmacist notified as instructed by telephone. 

## 2012-05-13 NOTE — Telephone Encounter (Signed)
Pt left v/m; pt having surgery on 05/15/12. Pt cannot find Lorazepam. Pt has searched house; pt request if cannot call in full prescription can # 3 be called in until pt has surgery.[place] [facility].Please advise.

## 2012-05-13 NOTE — Telephone Encounter (Signed)
Phone this in and notify pharmacy re: the situation.  Thanks.

## 2012-05-14 ENCOUNTER — Other Ambulatory Visit: Payer: Self-pay | Admitting: Orthopedic Surgery

## 2012-05-14 MED ORDER — SODIUM CHLORIDE 0.9 % IV SOLN
20.0000 ug | Freq: Once | INTRAVENOUS | Status: DC
  Filled 2012-05-14: qty 5

## 2012-05-14 MED ORDER — DESMOPRESSIN ACETATE 4 MCG/ML IJ SOLN: 20.0000 ug | Freq: Once | INTRAMUSCULAR | Status: DC

## 2012-05-14 NOTE — Progress Notes (Signed)
05/13/12 1118  OBSTRUCTIVE SLEEP APNEA  Have you ever been diagnosed with sleep apnea through a sleep study? Yes (over 10 yrs. ago)  If yes, do you have and use a CPAP or BPAP machine every night? 0  Do you snore loudly (loud enough to be heard through closed doors)?  0  Do you often feel tired, fatigued, or sleepy during the daytime? 1  Has anyone observed you stop breathing during your sleep? 1  Do you have, or are you being treated for high blood pressure? 1  BMI more than 35 kg/m2? 0  Age over 64 years old? 1  Gender: 0  Obstructive Sleep Apnea Score 4

## 2012-05-14 NOTE — Progress Notes (Signed)
History and notes reviewed with Dr Sampson Goon.

## 2012-05-15 ENCOUNTER — Encounter (HOSPITAL_BASED_OUTPATIENT_CLINIC_OR_DEPARTMENT_OTHER): Payer: Self-pay | Admitting: Anesthesiology

## 2012-05-15 ENCOUNTER — Encounter (HOSPITAL_BASED_OUTPATIENT_CLINIC_OR_DEPARTMENT_OTHER): Payer: Self-pay | Admitting: *Deleted

## 2012-05-15 ENCOUNTER — Encounter (HOSPITAL_BASED_OUTPATIENT_CLINIC_OR_DEPARTMENT_OTHER)
Admission: RE | Disposition: A | Discharge status: Home / Self Care | Payer: Self-pay | Source: Ambulatory Visit | Attending: Orthopedic Surgery

## 2012-05-15 ENCOUNTER — Encounter (HOSPITAL_BASED_OUTPATIENT_CLINIC_OR_DEPARTMENT_OTHER): Payer: Self-pay | Admitting: Orthopedic Surgery

## 2012-05-15 ENCOUNTER — Ambulatory Visit (HOSPITAL_BASED_OUTPATIENT_CLINIC_OR_DEPARTMENT_OTHER)
Admission: RE | Admit: 2012-05-15 | Discharge: 2012-05-15 | Disposition: A | Discharge status: Home / Self Care | Payer: Medicare Other | Source: Ambulatory Visit | Attending: Orthopedic Surgery | Admitting: Orthopedic Surgery

## 2012-05-15 ENCOUNTER — Ambulatory Visit (HOSPITAL_BASED_OUTPATIENT_CLINIC_OR_DEPARTMENT_OTHER): Payer: Medicare Other | Admitting: Anesthesiology

## 2012-05-15 DIAGNOSIS — Z9089 Acquired absence of other organs: Secondary | ICD-10-CM | POA: Insufficient documentation

## 2012-05-15 DIAGNOSIS — Z85828 Personal history of other malignant neoplasm of skin: Secondary | ICD-10-CM | POA: Insufficient documentation

## 2012-05-15 DIAGNOSIS — Z836 Family history of other diseases of the respiratory system: Secondary | ICD-10-CM | POA: Insufficient documentation

## 2012-05-15 DIAGNOSIS — E039 Hypothyroidism, unspecified: Secondary | ICD-10-CM | POA: Insufficient documentation

## 2012-05-15 DIAGNOSIS — I10 Essential (primary) hypertension: Secondary | ICD-10-CM | POA: Insufficient documentation

## 2012-05-15 DIAGNOSIS — Z8249 Family history of ischemic heart disease and other diseases of the circulatory system: Secondary | ICD-10-CM | POA: Insufficient documentation

## 2012-05-15 DIAGNOSIS — M75101 Unspecified rotator cuff tear or rupture of right shoulder, not specified as traumatic: Secondary | ICD-10-CM

## 2012-05-15 DIAGNOSIS — Z809 Family history of malignant neoplasm, unspecified: Secondary | ICD-10-CM | POA: Insufficient documentation

## 2012-05-15 DIAGNOSIS — E119 Type 2 diabetes mellitus without complications: Secondary | ICD-10-CM | POA: Insufficient documentation

## 2012-05-15 DIAGNOSIS — IMO0002 Reserved for concepts with insufficient information to code with codable children: Secondary | ICD-10-CM | POA: Insufficient documentation

## 2012-05-15 DIAGNOSIS — D6800 Von Willebrand disease, unspecified: Secondary | ICD-10-CM | POA: Insufficient documentation

## 2012-05-15 DIAGNOSIS — M129 Arthropathy, unspecified: Secondary | ICD-10-CM | POA: Insufficient documentation

## 2012-05-15 DIAGNOSIS — M25819 Other specified joint disorders, unspecified shoulder: Secondary | ICD-10-CM | POA: Insufficient documentation

## 2012-05-15 DIAGNOSIS — M24819 Other specific joint derangements of unspecified shoulder, not elsewhere classified: Secondary | ICD-10-CM | POA: Insufficient documentation

## 2012-05-15 DIAGNOSIS — K219 Gastro-esophageal reflux disease without esophagitis: Secondary | ICD-10-CM | POA: Insufficient documentation

## 2012-05-15 DIAGNOSIS — M75120 Complete rotator cuff tear or rupture of unspecified shoulder, not specified as traumatic: Secondary | ICD-10-CM | POA: Insufficient documentation

## 2012-05-15 DIAGNOSIS — Z8349 Family history of other endocrine, nutritional and metabolic diseases: Secondary | ICD-10-CM | POA: Insufficient documentation

## 2012-05-15 HISTORY — PX: SHOULDER ARTHROSCOPY WITH ROTATOR CUFF REPAIR AND SUBACROMIAL DECOMPRESSION: SHX5686

## 2012-05-15 HISTORY — DX: Headache: R51

## 2012-05-15 HISTORY — DX: Other amnesia: R41.3

## 2012-05-15 HISTORY — DX: Dental restoration status: Z98.811

## 2012-05-15 HISTORY — DX: Unspecified rotator cuff tear or rupture of right shoulder, not specified as traumatic: M75.101

## 2012-05-15 HISTORY — DX: Impingement syndrome of right shoulder: M75.41

## 2012-05-15 HISTORY — DX: Unspecified rotator cuff tear or rupture of unspecified shoulder, not specified as traumatic: M75.100

## 2012-05-15 HISTORY — DX: Personal history of other malignant neoplasm of skin: Z85.828

## 2012-05-15 HISTORY — DX: Other articular cartilage disorders, unspecified shoulder: M24.119

## 2012-05-15 HISTORY — DX: Anxiety disorder, unspecified: F41.9

## 2012-05-15 LAB — GLUCOSE, CAPILLARY
Glucose-Capillary: 103 mg/dL — ABNORMAL HIGH (ref 70–99)
Glucose-Capillary: 133 mg/dL — ABNORMAL HIGH (ref 70–99)

## 2012-05-15 SURGERY — SHOULDER ARTHROSCOPY WITH ROTATOR CUFF REPAIR AND SUBACROMIAL DECOMPRESSION
Anesthesia: Regional | Site: Shoulder | Laterality: Right | Wound class: Clean

## 2012-05-15 MED ORDER — OXYCODONE-ACETAMINOPHEN 5-325 MG PO TABS: 1.0000 | ORAL_TABLET | ORAL | Status: DC | PRN

## 2012-05-15 MED ORDER — LORAZEPAM 0.5 MG PO TABS: 0.5000 mg | ORAL_TABLET | Freq: Three times a day (TID) | ORAL | Status: DC | PRN

## 2012-05-15 MED ORDER — FENTANYL CITRATE 0.05 MG/ML IJ SOLN
50.0000 ug | INTRAMUSCULAR | Status: DC | PRN
  Administered 2012-05-15: 100 ug via INTRAVENOUS

## 2012-05-15 MED ORDER — LEVOTHYROXINE SODIUM 88 MCG PO TABS: 88.0000 ug | ORAL_TABLET | Freq: Every day | ORAL | Status: DC

## 2012-05-15 MED ORDER — LACTATED RINGERS IV SOLN
INTRAVENOUS | Status: DC
  Administered 2012-05-15 (×2): via INTRAVENOUS

## 2012-05-15 MED ORDER — METOCLOPRAMIDE HCL 5 MG PO TABS: 5.0000 mg | ORAL_TABLET | Freq: Three times a day (TID) | ORAL | Status: DC | PRN

## 2012-05-15 MED ORDER — LIDOCAINE HCL (CARDIAC) 20 MG/ML IV SOLN
INTRAVENOUS | Status: DC | PRN
  Administered 2012-05-15: 80 mg via INTRAVENOUS

## 2012-05-15 MED ORDER — HYDROMORPHONE HCL PF 1 MG/ML IJ SOLN: 0.5000 mg | INTRAMUSCULAR | Status: DC | PRN

## 2012-05-15 MED ORDER — DEXAMETHASONE SODIUM PHOSPHATE 4 MG/ML IJ SOLN
INTRAMUSCULAR | Status: DC | PRN
  Administered 2012-05-15: 5 mg via INTRAVENOUS

## 2012-05-15 MED ORDER — ONDANSETRON HCL 4 MG/2ML IJ SOLN: 4.0000 mg | Freq: Four times a day (QID) | INTRAMUSCULAR | Status: DC | PRN

## 2012-05-15 MED ORDER — SODIUM CHLORIDE 0.9 % IV SOLN
20.0000 ug | Freq: Once | INTRAVENOUS | Status: AC
  Administered 2012-05-15: 20 ug via INTRAVENOUS

## 2012-05-15 MED ORDER — FENTANYL CITRATE 0.05 MG/ML IJ SOLN: 50.0000 ug | Freq: Once | INTRAMUSCULAR | Status: DC

## 2012-05-15 MED ORDER — SODIUM CHLORIDE 0.9 % IR SOLN
Status: DC | PRN
  Administered 2012-05-15: 24000 mL

## 2012-05-15 MED ORDER — OXYCODONE HCL 5 MG/5ML PO SOLN: 5.0000 mg | Freq: Once | ORAL | Status: DC | PRN

## 2012-05-15 MED ORDER — DEXAMETHASONE SODIUM PHOSPHATE 4 MG/ML IJ SOLN
INTRAMUSCULAR | Status: DC | PRN
  Administered 2012-05-15: 4 mg

## 2012-05-15 MED ORDER — METHOCARBAMOL 100 MG/ML IJ SOLN: 500.0000 mg | Freq: Four times a day (QID) | INTRAVENOUS | Status: DC | PRN

## 2012-05-15 MED ORDER — ONDANSETRON HCL 4 MG/2ML IJ SOLN
INTRAMUSCULAR | Status: DC | PRN
  Administered 2012-05-15: 4 mg via INTRAVENOUS

## 2012-05-15 MED ORDER — ZOLPIDEM TARTRATE 5 MG PO TABS: 5.0000 mg | ORAL_TABLET | Freq: Every evening | ORAL | Status: DC | PRN

## 2012-05-15 MED ORDER — PANTOPRAZOLE SODIUM 40 MG PO TBEC: 40.0000 mg | DELAYED_RELEASE_TABLET | Freq: Two times a day (BID) | ORAL | Status: DC

## 2012-05-15 MED ORDER — SENNA 8.6 MG PO TABS: 1.0000 | ORAL_TABLET | Freq: Two times a day (BID) | ORAL | Status: DC

## 2012-05-15 MED ORDER — MIDAZOLAM HCL 2 MG/2ML IJ SOLN
1.0000 mg | INTRAMUSCULAR | Status: DC | PRN
  Administered 2012-05-15: 1 mg via INTRAVENOUS

## 2012-05-15 MED ORDER — DOCUSATE SODIUM 100 MG PO CAPS: 100.0000 mg | ORAL_CAPSULE | Freq: Two times a day (BID) | ORAL | Status: DC

## 2012-05-15 MED ORDER — METFORMIN HCL 500 MG PO TABS: 500.0000 mg | ORAL_TABLET | Freq: Every day | ORAL | Status: DC

## 2012-05-15 MED ORDER — INSULIN ASPART 100 UNIT/ML ~~LOC~~ SOLN: 0.0000 [IU] | Freq: Three times a day (TID) | SUBCUTANEOUS | Status: DC

## 2012-05-15 MED ORDER — METOPROLOL TARTRATE 25 MG PO TABS: 25.0000 mg | ORAL_TABLET | Freq: Two times a day (BID) | ORAL | Status: DC

## 2012-05-15 MED ORDER — POVIDONE-IODINE 7.5 % EX SOLN: Freq: Once | CUTANEOUS | Status: DC

## 2012-05-15 MED ORDER — VITAMIN B-12 100 MCG PO TABS: 50.0000 ug | ORAL_TABLET | Freq: Every day | ORAL | Status: DC

## 2012-05-15 MED ORDER — ONDANSETRON HCL 4 MG PO TABS: 4.0000 mg | ORAL_TABLET | Freq: Four times a day (QID) | ORAL | Status: DC | PRN

## 2012-05-15 MED ORDER — SODIUM CHLORIDE 0.9 % IV SOLN: INTRAVENOUS | Status: DC

## 2012-05-15 MED ORDER — CYCLOBENZAPRINE HCL 10 MG PO TABS: 10.0000 mg | ORAL_TABLET | Freq: Three times a day (TID) | ORAL | Status: DC | PRN

## 2012-05-15 MED ORDER — MIDAZOLAM HCL 2 MG/2ML IJ SOLN: 1.0000 mg | INTRAMUSCULAR | Status: DC | PRN

## 2012-05-15 MED ORDER — SUCCINYLCHOLINE CHLORIDE 20 MG/ML IJ SOLN
INTRAMUSCULAR | Status: DC | PRN
Start: 2012-05-15 — End: 2012-05-15
  Administered 2012-05-15: 100 mg via INTRAVENOUS

## 2012-05-15 MED ORDER — METHOCARBAMOL 500 MG PO TABS: 500.0000 mg | ORAL_TABLET | Freq: Four times a day (QID) | ORAL | Status: DC | PRN

## 2012-05-15 MED ORDER — METOCLOPRAMIDE HCL 5 MG/ML IJ SOLN: 5.0000 mg | Freq: Three times a day (TID) | INTRAMUSCULAR | Status: DC | PRN

## 2012-05-15 MED ORDER — OXYCODONE HCL 5 MG PO TABS: 5.0000 mg | ORAL_TABLET | ORAL | Status: DC | PRN

## 2012-05-15 MED ORDER — VITAMIN E 180 MG (400 UNIT) PO CAPS: 400.0000 [IU] | ORAL_CAPSULE | Freq: Every day | ORAL | Status: DC

## 2012-05-15 MED ORDER — SENNA-DOCUSATE SODIUM 8.6-50 MG PO TABS: 1.0000 | ORAL_TABLET | Freq: Every day | ORAL | Status: DC

## 2012-05-15 MED ORDER — BUPIVACAINE-EPINEPHRINE PF 0.5-1:200000 % IJ SOLN
INTRAMUSCULAR | Status: DC | PRN
  Administered 2012-05-15: 25 mL

## 2012-05-15 MED ORDER — CEFAZOLIN SODIUM-DEXTROSE 2-3 GM-% IV SOLR
2.0000 g | INTRAVENOUS | Status: AC
  Administered 2012-05-15: 2 g via INTRAVENOUS

## 2012-05-15 MED ORDER — PROMETHAZINE HCL 25 MG PO TABS: 25.0000 mg | ORAL_TABLET | Freq: Four times a day (QID) | ORAL | Status: DC | PRN

## 2012-05-15 MED ORDER — PROPOFOL 10 MG/ML IV BOLUS
INTRAVENOUS | Status: DC | PRN
  Administered 2012-05-15: 150 mg via INTRAVENOUS
  Administered 2012-05-15: 50 mg via INTRAVENOUS

## 2012-05-15 MED ORDER — PROMETHAZINE HCL 25 MG/ML IJ SOLN: 6.2500 mg | INTRAMUSCULAR | Status: DC | PRN

## 2012-05-15 MED ORDER — LORATADINE 10 MG PO TABS: 10.0000 mg | ORAL_TABLET | Freq: Every day | ORAL | Status: DC

## 2012-05-15 MED ORDER — HYDROMORPHONE HCL PF 1 MG/ML IJ SOLN: 0.2500 mg | INTRAMUSCULAR | Status: DC | PRN

## 2012-05-15 MED ORDER — OXYCODONE HCL 5 MG PO TABS: 5.0000 mg | ORAL_TABLET | Freq: Once | ORAL | Status: DC | PRN

## 2012-05-15 MED ORDER — CEFAZOLIN SODIUM-DEXTROSE 2-3 GM-% IV SOLR: 2.0000 g | Freq: Four times a day (QID) | INTRAVENOUS | Status: DC

## 2012-05-15 MED ORDER — OXYCODONE-ACETAMINOPHEN 10-325 MG PO TABS: 1.0000 | ORAL_TABLET | Freq: Four times a day (QID) | ORAL | Status: DC | PRN

## 2012-05-15 SURGICAL SUPPLY — 68 items
ANCH SUT SWLK 19.1X4.75 (Anchor) ×2 IMPLANT
ANCHOR SUT BIO SW 4.75X19.1 (Anchor) ×2 IMPLANT
APL SKNCLS STERI-STRIP NONHPOA (GAUZE/BANDAGES/DRESSINGS) ×1
BENZOIN TINCTURE PRP APPL 2/3 (GAUZE/BANDAGES/DRESSINGS) ×2 IMPLANT
BLADE CUTTER GATOR 3.5 (BLADE) ×2 IMPLANT
BLADE GREAT WHITE 4.2 (BLADE) IMPLANT
BLADE SURG 15 STRL LF DISP TIS (BLADE) IMPLANT
BLADE SURG 15 STRL SS (BLADE)
BUR OVAL 4.0 (BURR) ×1 IMPLANT
BUR OVAL 6.0 (BURR) IMPLANT
CANISTER OMNI JUG 16 LITER (MISCELLANEOUS) ×2 IMPLANT
CANNULA 5.75X71 LONG (CANNULA) ×1 IMPLANT
CANNULA TWIST IN 8.25X7CM (CANNULA) ×1 IMPLANT
CLOTH BEACON ORANGE TIMEOUT ST (SAFETY) ×2 IMPLANT
DECANTER SPIKE VIAL GLASS SM (MISCELLANEOUS) IMPLANT
DRAPE INCISE IOBAN 66X45 STRL (DRAPES) ×2 IMPLANT
DRAPE SHOULDER BEACH CHAIR (DRAPES) ×2 IMPLANT
DRAPE U 20/CS (DRAPES) ×2 IMPLANT
DRAPE U-SHAPE 47X51 STRL (DRAPES) ×2 IMPLANT
DRSG PAD ABDOMINAL 8X10 ST (GAUZE/BANDAGES/DRESSINGS) ×2 IMPLANT
DURAPREP 26ML APPLICATOR (WOUND CARE) ×2 IMPLANT
ELECT REM PT RETURN 9FT ADLT (ELECTROSURGICAL)
ELECTRODE REM PT RTRN 9FT ADLT (ELECTROSURGICAL) ×1 IMPLANT
FIBERSTICK 2 (SUTURE) ×1 IMPLANT
GLOVE BIO SURGEON STRL SZ 6.5 (GLOVE) ×3 IMPLANT
GLOVE BIO SURGEON STRL SZ8 (GLOVE) ×2 IMPLANT
GLOVE BIOGEL PI IND STRL 7.0 (GLOVE) IMPLANT
GLOVE BIOGEL PI IND STRL 8 (GLOVE) ×2 IMPLANT
GLOVE BIOGEL PI INDICATOR 7.0 (GLOVE) ×2
GLOVE BIOGEL PI INDICATOR 8 (GLOVE) ×2
GLOVE ORTHO TXT STRL SZ7.5 (GLOVE) ×2 IMPLANT
GOWN BRE IMP PREV XXLGXLNG (GOWN DISPOSABLE) ×5 IMPLANT
GOWN PREVENTION PLUS XLARGE (GOWN DISPOSABLE) ×1 IMPLANT
IMMOBILIZER SHOULDER XLGE (ORTHOPEDIC SUPPLIES) IMPLANT
KIT SHOULDER TRACTION (DRAPES) ×2 IMPLANT
LASSO SUT 90 DEGREE (SUTURE) IMPLANT
NDL SCORPION MULTI FIRE (NEEDLE) IMPLANT
NEEDLE SCORPION MULTI FIRE (NEEDLE) ×4 IMPLANT
PACK ARTHROSCOPY DSU (CUSTOM PROCEDURE TRAY) ×2 IMPLANT
PACK BASIN DAY SURGERY FS (CUSTOM PROCEDURE TRAY) ×2 IMPLANT
SET ARTHROSCOPY TUBING (MISCELLANEOUS) ×2
SET ARTHROSCOPY TUBING LN (MISCELLANEOUS) ×1 IMPLANT
SHEET MEDIUM DRAPE 40X70 STRL (DRAPES) ×2 IMPLANT
SLEEVE SCD COMPRESS KNEE MED (MISCELLANEOUS) ×2 IMPLANT
SLING ARM FOAM STRAP LRG (SOFTGOODS) IMPLANT
SLING ARM FOAM STRAP MED (SOFTGOODS) IMPLANT
SLING ARM FOAM STRAP XLG (SOFTGOODS) IMPLANT
SLING ARM IMMOBILIZER LRG (SOFTGOODS) ×1 IMPLANT
SLING ARM IMMOBILIZER MED (SOFTGOODS) IMPLANT
SPONGE GAUZE 4X4 12PLY (GAUZE/BANDAGES/DRESSINGS) ×2 IMPLANT
STRIP CLOSURE SKIN 1/2X4 (GAUZE/BANDAGES/DRESSINGS) ×2 IMPLANT
SUT FIBERWIRE #2 38 T-5 BLUE (SUTURE)
SUT LASSO 45 DEGREE (SUTURE) ×1 IMPLANT
SUT LASSO 45 DEGREE LEFT (SUTURE) IMPLANT
SUT LASSO 45D RIGHT (SUTURE) IMPLANT
SUT MNCRL AB 4-0 PS2 18 (SUTURE) ×1 IMPLANT
SUT PDS AB 1 CT  36 (SUTURE)
SUT PDS AB 1 CT 36 (SUTURE) IMPLANT
SUT TIGER TAPE 7 IN WHITE (SUTURE) ×1 IMPLANT
SUT VIC AB 3-0 SH 27 (SUTURE)
SUT VIC AB 3-0 SH 27X BRD (SUTURE) IMPLANT
SUTURE FIBERWR #2 38 T-5 BLUE (SUTURE) IMPLANT
TAPE FIBER 2MM 7IN #2 BLUE (SUTURE) ×1 IMPLANT
TOWEL OR 17X24 6PK STRL BLUE (TOWEL DISPOSABLE) ×2 IMPLANT
TOWEL OR NON WOVEN STRL DISP B (DISPOSABLE) ×2 IMPLANT
TUBE CONNECTING 20X1/4 (TUBING) IMPLANT
WAND STAR VAC 90 (SURGICAL WAND) ×2 IMPLANT
WATER STERILE IRR 1000ML POUR (IV SOLUTION) ×2 IMPLANT

## 2012-05-15 NOTE — Anesthesia Preprocedure Evaluation (Signed)
Anesthesia Evaluation  Patient identified by MRN, date of birth, ID band Patient awake    Reviewed: Allergy & Precautions, H&P , NPO status , Patient's Chart, lab work & pertinent test results  Airway Mallampati: II TM Distance: >3 FB Neck ROM: Full    Dental   Pulmonary  breath sounds clear to auscultation        Cardiovascular hypertension, + dysrhythmias Supra Ventricular Tachycardia Rhythm:Regular Rate:Normal     Neuro/Psych  Headaches, Anxiety Early Alzheimers  Neuromuscular disease    GI/Hepatic GERD-  ,  Endo/Other  diabetesHypothyroidism   Renal/GU      Musculoskeletal   Abdominal   Peds  Hematology   Anesthesia Other Findings   Reproductive/Obstetrics                           Anesthesia Physical Anesthesia Plan  ASA: III  Anesthesia Plan: General   Post-op Pain Management:    Induction: Intravenous  Airway Management Planned: Oral ETT  Additional Equipment:   Intra-op Plan:   Post-operative Plan: Extubation in OR  Informed Consent: I have reviewed the patients History and Physical, chart, labs and discussed the procedure including the risks, benefits and alternatives for the proposed anesthesia with the patient or authorized representative who has indicated his/her understanding and acceptance.     Plan Discussed with: CRNA and Surgeon  Anesthesia Plan Comments:         Anesthesia Quick Evaluation

## 2012-05-15 NOTE — Anesthesia Postprocedure Evaluation (Signed)
  Anesthesia Post-op Note  Patient: Stacy Davis  Procedure(s) Performed: Procedure(s) (LRB) with comments: SHOULDER ARTHROSCOPY WITH ROTATOR CUFF REPAIR AND SUBACROMIAL DECOMPRESSION (Right) - RIGHT ARTHROSCOPY SHOULDER DEBRIDEMENT LIMITED, ARTHROSCOPY SHOULDER DECOMPRESSION SUBACROMIAL PARTIAL ACROMIOPLASTY WITH CORACOACROMIAL RELEASE, ARTHROSCOPY SHOULDER WITH ROTATOR CUFF REPAIR  Patient Location: PACU  Anesthesia Type:GA combined with regional for post-op pain  Level of Consciousness: awake  Airway and Oxygen Therapy: Patient Spontanous Breathing  Post-op Pain: none  Post-op Assessment: Post-op Vital signs reviewed, Patient's Cardiovascular Status Stable, Respiratory Function Stable, Patent Airway and No signs of Nausea or vomiting  Post-op Vital Signs: Reviewed and stable  Complications: No apparent anesthesia complications

## 2012-05-15 NOTE — Progress Notes (Signed)
Assisted Dr. Gypsy Balsam with right, ultrasound guided, interscalene block. Side rails up, monitors on throughout procedure. See vital signs in flow sheet. Tolerated Procedure well. Small bloody ooze noted when needle removed at insertion site. 2x2 gauze taped in place by Dr. Gypsy Balsam. Will call husband back to bedside to be with pt.

## 2012-05-15 NOTE — Anesthesia Procedure Notes (Addendum)
Anesthesia Regional Block:  Interscalene brachial plexus block  Pre-Anesthetic Checklist: ,, timeout performed, Correct Patient, Correct Site, Correct Laterality, Correct Procedure, Correct Position, site marked, Risks and benefits discussed,  Surgical consent,  Pre-op evaluation,  At surgeon's request and post-op pain management  Laterality: Right  Prep: chloraprep       Needles:  Injection technique: Single-shot  Needle Type: Echogenic Stimulator Needle     Needle Length: 5cm 5 cm Needle Gauge: 22 and 22 G    Additional Needles:  Procedures: ultrasound guided (picture in chart) and nerve stimulator Interscalene brachial plexus block  Nerve Stimulator or Paresthesia:  Response: 0.48 mA,   Additional Responses:   Narrative:  Start time: 05/15/2012 9:24 AM End time: 05/15/2012 9:40 AM Injection made incrementally with aspirations every 3 mL. Anesthesiologist: Dr Gypsy Balsam  Additional Notes: [id number] R ISB POP CHG prep, sterile tech #[id number] stim/echo needle with stim down to .48ma and good [place] visualization--PIX in chart Multiple neg asp Marc .5% w/epi 25cc+decadron 4mg  infil No compl Dr Gypsy Balsam   Procedure Name: Intubation Date/Time: 05/15/2012 10:22 AM Performed by: Burna Cash Pre-anesthesia Checklist: Patient identified, Emergency Drugs available, Suction available and Patient being monitored Patient Re-evaluated:Patient Re-evaluated prior to inductionOxygen Delivery Method: Circle System Utilized Preoxygenation: Pre-oxygenation with 100% oxygen Intubation Type: IV induction Ventilation: Mask ventilation without difficulty Laryngoscope Size: Miller and 2 Grade View: Grade II Tube type: Oral Number of attempts: 1 Airway Equipment and Method: stylet and oral airway Placement Confirmation: ETT inserted through vocal cords under direct vision,  positive ETCO2 and breath sounds checked- equal and bilateral Secured at: 21 cm Tube secured with: Tape Dental Injury:  Teeth and Oropharynx as per pre-operative assessment

## 2012-05-15 NOTE — H&P (Signed)
PREOPERATIVE H&P  Chief Complaint: RIGHT SHOULDER PAIN  HPI: Stacy Davis is a 66 y.o. female who presents for preoperative history and physical with a diagnosis of RIGHT DISORDER ARTICULAR CARTILAGE-SHOULDER, IMPINGEMENT SYNDROME-SHOULDER, COMPLETE RUPTURE OF ROTATOR CUFF. Symptoms are rated as moderate to severe, and have been worsening.  This is significantly impairing activities of daily living.  She has elected for surgical management. She has failed injections, exercises, as well as pain medications. This began after she was throwing a beanbag while playing cornhole, and felt a pop.  Past Medical History  Diagnosis Date  . Back pain     h/o DDD since 2010  . Arthritis   . GERD (gastroesophageal reflux disease)   . Hyperlipidemia     no current med.  . Hypothyroid   . Von Willebrand disease   . Headache     occasional  . Memory impairment   . History of skin cancer   . Articular cartilage disorder of shoulder 04/2012    right  . Rotator cuff rupture 04/2012    right  . Impingement syndrome of right shoulder 04/2012  . Hypertension     under control with med., has been on med. x 2 yr.  . Diabetes mellitus     NIDDM  . Anxiety   . Dental crowns present     also dental caps   Past Surgical History  Procedure Date  . Cesarean section     x 2  . Tubal ligation   . Breast surgery     hematoma evacuation due to trauma  . Cardiac catheterization 03/06/2001  . Colonoscopy 01/20/2006 per patient  . Appendectomy   . Orif wrist fracture     left  . Lumbar laminectomy/decompression microdiscectomy 03/01/2009    right L4-5; arthrodesis L4-5   History   Social History  . Marital Status: Married    Spouse Name: N/A    Number of Children: N/A  . Years of Education: N/A   Social History Main Topics  . Smoking status: Never Smoker   . Smokeless tobacco: Never Used  . Alcohol Use: No  . Drug Use: No  . Sexually Active: None   Other Topics Concern  . None    Social History Narrative   Married 1967, 2 kidsRetired from office work   Family History  Problem Relation Age of Onset  . Dementia Mother   . Cancer Father     unknown primary  . Heart disease Father   . Thyroid disease Sister   . Cancer Sister     brain tumor  . Dementia Sister   . COPD Brother   . Cancer Brother     bone cancer   Allergies  Allergen Reactions  . Tramadol Other (See Comments)    CHANGES IN MEMORY   Prior to Admission medications   Medication Sig Start Date End Date Taking? Authorizing Provider  cyclobenzaprine (FLEXERIL) 10 MG tablet Take 10 mg by mouth 3 (three) times daily as needed.    Yes Historical Provider, MD  HYDROcodone-acetaminophen (NORCO/VICODIN) 5-325 MG per tablet Take 1 tablet by mouth every 8 (eight) hours as needed.    Yes Historical Provider, MD  levothyroxine (SYNTHROID, LEVOTHROID) 88 MCG tablet Take 1 tablet (88 mcg total) by mouth daily. 04/10/12  Yes Joaquim Nam, MD  LORazepam (ATIVAN) 0.5 MG tablet Take 1 tablet (0.5 mg total) by mouth 3 (three) times daily as needed for anxiety. 05/13/12  Yes Joaquim Nam, MD  meloxicam (MOBIC) 7.5 MG tablet Take 1 tablet (7.5 mg total) by mouth 2 (two) times daily. 07/22/11  Yes Joaquim Nam, MD  metFORMIN (GLUCOPHAGE) 500 MG tablet Take 1 tablet (500 mg total) by mouth daily with breakfast. 08/13/11  Yes Joaquim Nam, MD  metoprolol tartrate (LOPRESSOR) 25 MG tablet Take 1 tablet (25 mg total) by mouth 2 (two) times daily. 07/23/11  Yes Joaquim Nam, MD  Multiple Vitamin (MULTIVITAMIN) tablet Take 1 tablet by mouth daily.   Yes Historical Provider, MD  pantoprazole (PROTONIX) 40 MG tablet Take 1 tablet (40 mg total) by mouth 2 (two) times daily. 05/06/12  Yes Joaquim Nam, MD  vitamin B-12 (CYANOCOBALAMIN) 100 MCG tablet Take 50 mcg by mouth daily.   Yes Historical Provider, MD  vitamin E 400 UNIT capsule Take 400 Units by mouth daily.   Yes Historical Provider, MD  zolpidem (AMBIEN)  10 MG tablet Take 5 mg by mouth at bedtime as needed. 02/20/12  Yes Joaquim Nam, MD  cetirizine (ZYRTEC) 10 MG tablet Take 10 mg by mouth daily.    Historical Provider, MD     Positive ROS: All other systems have been reviewed and were otherwise negative with the exception of those mentioned in the HPI and as above.  Physical Exam: General: Alert, no acute distress Cardiovascular: No pedal edema Respiratory: No cyanosis, no use of accessory musculature GI: No organomegaly, abdomen is soft and non-tender Skin: No lesions in the area of chief complaint Neurologic: Sensation intact distally Psychiatric: Patient is competent for consent with normal mood and affect Lymphatic: No axillary or cervical lymphadenopathy  MUSCULOSKELETAL: Right shoulder active forward flexion is 0-175, but she does have weakness with supraspinatus testing. She denies pain over the a.c. joint. Her infraspinatus is strong.  Assessment: RIGHT DISORDER ARTICULAR CARTILAGE-SHOULDER, IMPINGEMENT SYNDROME-SHOULDER, COMPLETE RUPTURE OF ROTATOR CUFF  Plan: Plan for Procedure(s): SHOULDER ARTHROSCOPY WITH ROTATOR CUFF REPAIR AND SUBACROMIAL DECOMPRESSION  The risks benefits and alternatives were discussed with the patient including but not limited to the risks of nonoperative treatment, versus surgical intervention including infection, bleeding, nerve injury,  blood clots, cardiopulmonary complications, morbidity, mortality, among others, and they were willing to proceed. We have also discussed the risks of recurrent tear, stiffness, loss of function, and inability to regain strength, among others  Tyquan Carmickle P, MD Cell [phone] Pager [phone]  05/15/2012 9:42 AM

## 2012-05-15 NOTE — Transfer of Care (Signed)
Immediate Anesthesia Transfer of Care Note  Patient: Stacy Davis  Procedure(s) Performed: Procedure(s) (LRB) with comments: SHOULDER ARTHROSCOPY WITH ROTATOR CUFF REPAIR AND SUBACROMIAL DECOMPRESSION (Right) - RIGHT ARTHROSCOPY SHOULDER DEBRIDEMENT LIMITED, ARTHROSCOPY SHOULDER DECOMPRESSION SUBACROMIAL PARTIAL ACROMIOPLASTY WITH CORACOACROMIAL RELEASE, ARTHROSCOPY SHOULDER WITH ROTATOR CUFF REPAIR  Patient Location: PACU  Anesthesia Type:GA combined with regional for post-op pain  Level of Consciousness: sedated  Airway & Oxygen Therapy: Patient Spontanous Breathing and Patient connected to face mask oxygen  Post-op Assessment: Report given to PACU RN and Post -op Vital signs reviewed and stable  Post vital signs: Reviewed and stable  Complications: No apparent anesthesia complications

## 2012-05-15 NOTE — Op Note (Signed)
05/15/2012  12:41 PM  PATIENT:  Stacy Davis    PRE-OPERATIVE DIAGNOSIS:  Right shoulder impingement syndrome, full-thickness rotator cuff tear, labral tear  POST-OPERATIVE DIAGNOSIS:  Right shoulder impingement syndrome, supraspinatus tear, subscapularis tear, type II labral tear  PROCEDURE:  Right shoulder arthroscopy with subscapularis and supraspinatus repair, acromioplasty, and debridement of labrum  SURGEON:  Eulas Post, MD  PHYSICIAN ASSISTANT: Janace Litten, OPA-C, present and scrubbed throughout the case, critical for completion in a timely fashion, and for retraction, instrumentation, and closure.  ANESTHESIA:   General  PREOPERATIVE INDICATIONS:  Stacy Davis is a  66 y.o. female with a diagnosis of RIGHT DISORDER ARTICULAR CARTILAGE-SHOULDER, IMPINGEMENT SYNDROME-SHOULDER, COMPLETE RUPTURE OF ROTATOR CUFF who failed conservative measures and elected for surgical management.    The risks benefits and alternatives were discussed with the patient preoperatively including but not limited to the risks of infection, bleeding, nerve injury, cardiopulmonary complications, the need for revision surgery, among others, and the patient was willing to proceed.  OPERATIVE IMPLANTS: Arthrex bio composite 4.75 mm swivel lock, one for the subscapularis, and 2 for the supraspinatus  OPERATIVE FINDINGS: Subscapularis tear, with retracted supraspinatus tear, with degenerative tissue, mediocre quality, type II acromion, type II labral tear. No glenohumeral articular cartilage tear or arthritis. The biceps tendon was intact, although it did have a little bit of fraying.  OPERATIVE PROCEDURE: The patient is brought to the operating room and placed in supine position. General anesthesia was administered. IV antibiotics were given. Regional block been administered. Time out was performed. The right upper extremity was examined and had full motion. It was then prepped and draped in usual  sterile fashion. Diagnostic arthroscopy was carried out the above-named findings. I used the arthroscopic shaver to debride the leading edge of the subscapularis, as well as the torn tissue on the labrum. I then placed 2 cannulas anteriorly, and repaired the subscapularis using inverted FiberWire suture. I used a 4.0 mm bur to prepare the bed for the reinsertion. Excellent repair was achieved.  I then turned my attention to the subacromial space. CA ligament was released. A mild acromioplasty performed. I also performed a light tubercleplasty. I examined the tear from multiple portals and was found to be a crescent-type tear that was fairly mobile. I passed a total of 2 fiber tape sutures, one posteriorly and one anteriorly in a horizontal mattress types configuration. I then anchored the posterior suture anchor first, and this brought the tendon down to the bone quite nicely. I barely had enough room for the second anchor, but I was able to fit this. It was fairly close to the first anchor, but I did have satisfactory hold. The tendon had been brought down to the bone quite nicely with no dog ear tags.  I confirmed appropriate acromioplasty from the lateral portal, and remove the arthroscopic instruments, and cut all of the FiberWire suture prior to removal of the instruments, and the portals were closed with Monocryl followed by Steri-Strips and sterile gauze. She was awakened and returned to PACU in stable and satisfactory condition. There were no complications.

## 2012-05-18 ENCOUNTER — Encounter (HOSPITAL_BASED_OUTPATIENT_CLINIC_OR_DEPARTMENT_OTHER): Payer: Self-pay | Admitting: Orthopedic Surgery

## 2012-05-18 LAB — POCT HEMOGLOBIN-HEMACUE: Hemoglobin: 11 g/dL — ABNORMAL LOW (ref 12.0–15.0)

## 2012-06-01 ENCOUNTER — Other Ambulatory Visit: Payer: Self-pay | Admitting: *Deleted

## 2012-06-01 NOTE — Telephone Encounter (Signed)
Faxed refill request. Please advise.  

## 2012-06-02 MED ORDER — LORAZEPAM 0.5 MG PO TABS: 0.5000 mg | ORAL_TABLET | Freq: Three times a day (TID) | ORAL | Status: DC | PRN

## 2012-06-02 NOTE — Telephone Encounter (Signed)
Rx phoned to pharmacy.  

## 2012-06-02 NOTE — Telephone Encounter (Signed)
Please call in.  If needed frequently, have pt contact office with update.  Thanks.  Sedation caution.

## 2012-06-09 ENCOUNTER — Other Ambulatory Visit: Payer: Self-pay

## 2012-06-09 MED ORDER — LORAZEPAM 0.5 MG PO TABS: 0.5000 mg | ORAL_TABLET | Freq: Three times a day (TID) | ORAL | Status: DC | PRN

## 2012-06-09 NOTE — Telephone Encounter (Signed)
Pt states having anxiety attacks; pt request refill on lorazepam  To [facility] pharmacy. Pt request larger quantity than # 15.Please advise.

## 2012-06-09 NOTE — Telephone Encounter (Signed)
Rx phoned to pharmacy.  Patient advised.  Appt with [facility] scheduled.

## 2012-06-09 NOTE — Telephone Encounter (Signed)
Please call in, sedation caution.  Schedule OV to discuss.  Thanks.

## 2012-06-10 ENCOUNTER — Ambulatory Visit (INDEPENDENT_AMBULATORY_CARE_PROVIDER_SITE_OTHER): Payer: Medicare Other | Admitting: Family Medicine

## 2012-06-10 ENCOUNTER — Encounter: Payer: Self-pay | Admitting: Family Medicine

## 2012-06-10 VITALS — BP 120/84 | HR 122 | Temp 98.4°F | Wt 157.0 lb

## 2012-06-10 DIAGNOSIS — F29 Unspecified psychosis not due to a substance or known physiological condition: Secondary | ICD-10-CM

## 2012-06-10 DIAGNOSIS — R410 Disorientation, unspecified: Secondary | ICD-10-CM | POA: Insufficient documentation

## 2012-06-10 LAB — POCT URINALYSIS DIPSTICK
Bilirubin, UA: NEGATIVE
Blood, UA: NEGATIVE
Glucose, UA: NEGATIVE
Ketones, UA: NEGATIVE
Leukocytes, UA: NEGATIVE
Nitrite, UA: NEGATIVE
Protein, UA: NEGATIVE
Spec Grav, UA: 1.01
Urobilinogen, UA: NEGATIVE
pH, UA: 5

## 2012-06-10 NOTE — Progress Notes (Signed)
She was anxious about the surgery before the operation and had used lorazepam a few days before surgery.  Surgery was completely for more than expected shoulder pathology, but successful none the less.  Since then, with increasing agitation, "fidgety" at night with occ confusion.  Unclear if she is taking her pain meds at night.  Pain increases at night.  She is more anxious and tearful.  She hasn't been able to get out and resume baseline activities due to shoulder pain.  She sling she was wearing was aggravating her so she stopped wearing it (I advised he to talk to ortho about that).  Meds, vitals, and allergies reviewed.   ROS: See HPI.  Otherwise, noncontributory.  nad but tearful Speech wnl 3/3 recall, alert and oriented Tm wnl x2 Nasal and op exam wnl Neck supple R shoulder w/o erythema; dec in ROM as expected ctab abd soft, not ttp Ext w/o edema Skin w/o rash

## 2012-06-10 NOTE — Assessment & Plan Note (Signed)
Ddx: delerium due to anesthesia/hospitalization, pain, meds, anxiety, depression.  No focal neuro changes.  All sx worse at night.   Would treat pain with oxycodone at night and use lorazepam only if anxious.  Will have them call back with update in about 2 days to let me know how she is. Urine dip wnl, cmet and cbc pending.  >25 min spent with face to face with patient, >50% counseling and/or coordinating care  Nontoxic, okay for outpatient f/u.  No focal motor change and I don't suspect CVA with fluctuating changes at night.

## 2012-06-10 NOTE — Patient Instructions (Signed)
Go to the lab on the way out.  We'll contact you with your lab report.  Go ahead and take the oxycodone once at night to control the pain.  If you get anxious, then take the lorazepam but not more than every 8 hours.  If you aren't anxious, then avoid taking it.   Call me with an update on Friday.

## 2012-06-11 LAB — CBC WITH DIFFERENTIAL/PLATELET
Basophils Absolute: 0 10*3/uL (ref 0.0–0.1)
Basophils Relative: 0.3 % (ref 0.0–3.0)
Eosinophils Absolute: 0.1 10*3/uL (ref 0.0–0.7)
Eosinophils Relative: 1.2 % (ref 0.0–5.0)
HCT: 34.7 % — ABNORMAL LOW (ref 36.0–46.0)
Hemoglobin: 11.6 g/dL — ABNORMAL LOW (ref 12.0–15.0)
Lymphocytes Relative: 25.3 % (ref 12.0–46.0)
Lymphs Abs: 1.3 10*3/uL (ref 0.7–4.0)
MCHC: 33.3 g/dL (ref 30.0–36.0)
MCV: 83.1 fl (ref 78.0–100.0)
Monocytes Absolute: 0.3 10*3/uL (ref 0.1–1.0)
Monocytes Relative: 5.3 % (ref 3.0–12.0)
Neutro Abs: 3.4 10*3/uL (ref 1.4–7.7)
Neutrophils Relative %: 67.9 % (ref 43.0–77.0)
Platelets: 245 10*3/uL (ref 150.0–400.0)
RBC: 4.18 Mil/uL (ref 3.87–5.11)
RDW: 14.8 % — ABNORMAL HIGH (ref 11.5–14.6)
WBC: 5 10*3/uL (ref 4.5–10.5)

## 2012-06-11 LAB — COMPREHENSIVE METABOLIC PANEL
ALT: 18 U/L (ref 0–35)
AST: 31 U/L (ref 0–37)
Albumin: 4.4 g/dL (ref 3.5–5.2)
Alkaline Phosphatase: 51 U/L (ref 39–117)
BUN: 11 mg/dL (ref 6–23)
CO2: 26 mEq/L (ref 19–32)
Calcium: 9.7 mg/dL (ref 8.4–10.5)
Chloride: 104 mEq/L (ref 96–112)
Creatinine, Ser: 1 mg/dL (ref 0.4–1.2)
GFR: 59.12 mL/min — ABNORMAL LOW (ref >60.00)
Glucose, Bld: 238 mg/dL — ABNORMAL HIGH (ref 70–99)
Potassium: 4.1 mEq/L (ref 3.5–5.1)
Sodium: 141 mEq/L (ref 135–145)
Total Bilirubin: 0.6 mg/dL (ref 0.3–1.2)
Total Protein: 7.6 g/dL (ref 6.0–8.3)

## 2012-06-12 ENCOUNTER — Telehealth: Payer: Self-pay | Admitting: Family Medicine

## 2012-06-12 NOTE — Telephone Encounter (Signed)
Dr. Para March:  Patient is calling to give you an update Her arm is still in a lot of pain every time she moves but she is doing "some better" with the crying, panic attacks, and anxiety.

## 2012-06-12 NOTE — Telephone Encounter (Signed)
Noted, please have them notify ortho about the pain and continue as is otherwise.  Thanks.  If the anxiety gets worse, then notify us.

## 2012-06-12 NOTE — Telephone Encounter (Signed)
Patient notified as instructed by telephone. 

## 2012-06-18 ENCOUNTER — Other Ambulatory Visit: Payer: Self-pay | Admitting: *Deleted

## 2012-06-18 MED ORDER — CYCLOBENZAPRINE HCL 10 MG PO TABS: 5.0000 mg | ORAL_TABLET | Freq: Three times a day (TID) | ORAL | Status: DC | PRN

## 2012-06-18 NOTE — Telephone Encounter (Signed)
Sent. Thanks.   

## 2012-06-18 NOTE — Telephone Encounter (Signed)
Faxed refill request. Please advise.  

## 2012-07-27 ENCOUNTER — Other Ambulatory Visit: Payer: Self-pay | Admitting: *Deleted

## 2012-07-27 MED ORDER — METOPROLOL TARTRATE 25 MG PO TABS: 25.0000 mg | ORAL_TABLET | Freq: Two times a day (BID) | ORAL | Status: DC

## 2012-08-11 ENCOUNTER — Ambulatory Visit (INDEPENDENT_AMBULATORY_CARE_PROVIDER_SITE_OTHER): Payer: Medicare Other | Admitting: Family Medicine

## 2012-08-11 ENCOUNTER — Encounter: Payer: Self-pay | Admitting: Family Medicine

## 2012-08-11 VITALS — BP 108/70 | HR 83 | Temp 98.0°F

## 2012-08-11 DIAGNOSIS — Z789 Other specified health status: Secondary | ICD-10-CM

## 2012-08-11 DIAGNOSIS — R413 Other amnesia: Secondary | ICD-10-CM

## 2012-08-11 NOTE — Patient Instructions (Addendum)
Please call about restarting therapy for your shoulder.  I'll talk to neuro in the meantime and we'll be in touch.   Don't change your meds for now.

## 2012-08-11 NOTE — Progress Notes (Signed)
She quit therapy on her R shoulder 2 weeks ago and I encouraged her to restart this.  She is still doing some exercises at home.  She is still having some pain.  It is worse at night, in the middle of the night.  Rare oxycodone and rare ambien.  Used lorazepam only twice for panic symptoms recently, with relief.    "How's your memory?"  "Bad."  She is having trouble with details her husband is having to help her, esp with keeping her meds straight.  'I'll take me meds in the morning and then forget that I've taken them."  She occ forgets how to use the TV remote.  Some days are worse than others.  First noted 2-3 years ago.  She had declined meds for AD prev, with last MD at prev clinic.  She has had an occ mild tremor in her hands, one or the other, not always consistent.  She has had occ handwriting changes from the tremor.    Her sleep has been disrupted, but not from pain.  She isn't snoring loudly or waking/gasping for air.    Mother and sister with alzheimer's dementia but no h/o parkinson's.    She's has had more trouble remembering recent events ie meals.  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   PMH and SH reviewed  ROS: See HPI, otherwise noncontributory.  Meds, vitals, and allergies reviewed.   GEN: nad, alert and oriented, tearful discussing loss of memory HEENT: mucous membranes moist NECK: supple w/o LA CV: rrr PULM: ctab, no inc wob ABD: soft, +bs EXT: no edema SKIN: no acute rash 29/30 on MMSE

## 2012-08-12 ENCOUNTER — Telehealth: Payer: Self-pay | Admitting: Family Medicine

## 2012-08-12 DIAGNOSIS — Z789 Other specified health status: Secondary | ICD-10-CM | POA: Insufficient documentation

## 2012-08-12 DIAGNOSIS — G479 Sleep disorder, unspecified: Secondary | ICD-10-CM

## 2012-08-12 NOTE — Telephone Encounter (Signed)
Call pt.   Neuro mentioned OSA as a possible cause of her sx.   Please clarify- did she have prev sleep study done, dx of OSA? If so, when did she stop CPAP? We may need to address this before doing anything else. If she had prev intolerance to CPAP, we may need to change her mask.  OSA could have an effect on all of her sx.

## 2012-08-12 NOTE — Telephone Encounter (Signed)
Also please ask about returning the original living wills- either by mail or having them pick them up.  Thanks.

## 2012-08-12 NOTE — Assessment & Plan Note (Addendum)
I called and d/w Dr. Marjory Lies.  She has memory changes by history that outpace her findings on MMSE here in clinic. This doesn't appear to be med related.  Prev labs and CT reviewed. Unclear if this is dementia or pseudodementia.  OSA could play a role.  I thought that the patient wasn't having sig OSA sx but per neuro she had prev dx of OSA and poor compliance due to poor mask fit/tolerance.  See following phone note.  If this is the case, we'll need OSA eval/tx.  If not, we can try to address possible dementia.  >25 min spent with face to face with patient, >50% counseling and/or coordinating care.

## 2012-08-13 NOTE — Telephone Encounter (Signed)
Patient advised.  Patient says that she does not think she can deal with that machine because it was a nightmare for her when she used it during her sleep study in the hospital.  I explained again that the MD feels that might be the cause of her symptoms but she still was very hesitant.  I asked her to think about it and talk with her husband about it and that hopefully she would have made a decision once [facility] phones her.

## 2012-08-13 NOTE — Telephone Encounter (Signed)
Then we need to get her set up with pulmonary to check for OSA.  She may need another sleep study.  I would do that first.  The referral is in.

## 2012-08-13 NOTE — Telephone Encounter (Signed)
Patient/husband will be coming by our office today to pick up the original copies of their living will.  Patient says she did have the sleep study but never had a CPAP machine set up in her home.

## 2012-08-14 ENCOUNTER — Telehealth: Payer: Self-pay | Admitting: Family Medicine

## 2012-08-14 NOTE — Telephone Encounter (Signed)
Patient and husband advised. 

## 2012-08-14 NOTE — Telephone Encounter (Signed)
Called patient to help make Sleep Apnea consult. She said she has had one before and is Not interested at all in going again. Please advise if you want me to cancel this referral in Epic.

## 2012-08-14 NOTE — Telephone Encounter (Signed)
Please cancel this and then have Lugene call the patient re: the plan.  We are basically in a holding pattern until the potential sleep concerns are addressed.  It wouldn't make sense to do anything else in the meantime.  She can talk with neuro about this if she prefers.  I'll defer to pt/husband.

## 2012-08-17 ENCOUNTER — Other Ambulatory Visit: Payer: Self-pay | Admitting: *Deleted

## 2012-08-17 MED ORDER — MELOXICAM 7.5 MG PO TABS: 7.5000 mg | ORAL_TABLET | Freq: Two times a day (BID) | ORAL | Status: DC | PRN

## 2012-08-17 NOTE — Telephone Encounter (Signed)
Faxed refill request.  Last filled 07/06/12 for 60 tabs.  Please advise.

## 2012-08-17 NOTE — Telephone Encounter (Signed)
Sent!

## 2012-08-18 ENCOUNTER — Other Ambulatory Visit: Payer: Self-pay | Admitting: *Deleted

## 2012-08-18 MED ORDER — METFORMIN HCL 500 MG PO TABS: 500.0000 mg | ORAL_TABLET | Freq: Two times a day (BID) | ORAL | Status: DC

## 2012-09-28 ENCOUNTER — Other Ambulatory Visit: Payer: Self-pay | Admitting: *Deleted

## 2012-09-28 MED ORDER — LEVOTHYROXINE SODIUM 88 MCG PO TABS: 88.0000 ug | ORAL_TABLET | Freq: Every day | ORAL | Status: DC

## 2012-09-29 ENCOUNTER — Other Ambulatory Visit: Payer: Self-pay | Admitting: *Deleted

## 2012-10-06 ENCOUNTER — Other Ambulatory Visit: Payer: Self-pay | Admitting: *Deleted

## 2012-10-06 MED ORDER — ZOLPIDEM TARTRATE 10 MG PO TABS: 5.0000 mg | ORAL_TABLET | Freq: Every evening | ORAL | Status: DC | PRN

## 2012-10-06 NOTE — Telephone Encounter (Signed)
Please call in

## 2012-10-06 NOTE — Telephone Encounter (Signed)
Faxed refill request.  Last filled 08/06/12.  Remaining refills out of date.

## 2012-10-07 NOTE — Telephone Encounter (Signed)
Prescription called to pharmacy as instructed. 

## 2012-11-25 ENCOUNTER — Other Ambulatory Visit: Payer: Self-pay | Admitting: Family Medicine

## 2012-11-30 ENCOUNTER — Other Ambulatory Visit: Payer: Self-pay

## 2012-11-30 NOTE — Telephone Encounter (Deleted)
Electronic refill request.  Please advise. 

## 2012-11-30 NOTE — Telephone Encounter (Signed)
Needs to continue to come through Dr. Dion Saucier.  Thanks.

## 2012-11-30 NOTE — Telephone Encounter (Signed)
Pt request rx oxycodone apap 10-325 mg; Dr Dion Saucier has been prescribing for back pain but pt does not have scheduled appt with Dr Dion Saucier and cannot remember last time saw Dr Jamesetta Geralds request Dr Para March to write prescription.Please advise.Pt request cb.

## 2012-12-01 ENCOUNTER — Other Ambulatory Visit: Payer: Self-pay | Admitting: Family Medicine

## 2012-12-03 ENCOUNTER — Other Ambulatory Visit: Payer: Self-pay

## 2012-12-16 ENCOUNTER — Encounter: Payer: Self-pay | Admitting: Family Medicine

## 2012-12-16 ENCOUNTER — Ambulatory Visit (INDEPENDENT_AMBULATORY_CARE_PROVIDER_SITE_OTHER): Payer: Medicare Other | Admitting: Family Medicine

## 2012-12-16 VITALS — BP 122/80 | HR 84 | Temp 98.5°F | Wt 161.0 lb

## 2012-12-16 DIAGNOSIS — R413 Other amnesia: Secondary | ICD-10-CM

## 2012-12-16 DIAGNOSIS — E119 Type 2 diabetes mellitus without complications: Secondary | ICD-10-CM

## 2012-12-16 DIAGNOSIS — M549 Dorsalgia, unspecified: Secondary | ICD-10-CM

## 2012-12-16 MED ORDER — OXYCODONE-ACETAMINOPHEN 5-325 MG PO TABS: 0.5000 | ORAL_TABLET | Freq: Three times a day (TID) | ORAL | Status: DC | PRN

## 2012-12-16 NOTE — Assessment & Plan Note (Signed)
Again encouraged OSA eval.  Discussed at length.  >25 min spent with face to face with patient, >50% counseling and/or coordinating care in total.

## 2012-12-16 NOTE — Assessment & Plan Note (Signed)
Continue prn oxycodone.  She is out of the habit of back exercising.  D/w pt about restarting. Opiate cautions given.  Understood. Report back as needed.

## 2012-12-16 NOTE — Assessment & Plan Note (Signed)
Return for f/u later in 2014

## 2012-12-16 NOTE — Progress Notes (Signed)
Lower back pain.  H/o back surgery.  L lower back pain, not as much in midline.  Pain with twisting to L and with back extension. No weakness and no BLE sx. Not sleeping well in bed, having to sleep in recliner. No trauma.  Out of oxycodone, had used some prev with relief. D/w pt about options.   She still has concerns for memory loss.  D/w pt about OSA eval.  She has known OSA and I reiterated that it was reasonable to check on this, with eval.  Discussed at length.  Meds, vitals, and allergies reviewed.   ROS: See HPI.  Otherwise, noncontributory.  nad ncat Mmm rrr ctab abd soft Back w/o midline pain but ttp in L lower back, pain with twisting to L and with back extension. Distally nv intact in the BLE.

## 2012-12-16 NOTE — Patient Instructions (Signed)
Use the pain medicine as needed for now.  Be careful about constipation.   Give me an update as needed.  I highly recommend you allow me to refer you for evaluation of sleep apnea.  I would schedule a routine diabetes check with labs ahead of time in the next 1-2 months.  Take care.

## 2012-12-28 ENCOUNTER — Other Ambulatory Visit: Payer: Self-pay | Admitting: Family Medicine

## 2013-01-13 ENCOUNTER — Other Ambulatory Visit: Payer: Self-pay | Admitting: Family Medicine

## 2013-02-22 ENCOUNTER — Other Ambulatory Visit: Payer: Self-pay | Admitting: Family Medicine

## 2013-02-22 NOTE — Telephone Encounter (Signed)
Electronic refill request.  Please advise. 

## 2013-02-22 NOTE — Telephone Encounter (Signed)
Sent!

## 2013-02-23 ENCOUNTER — Other Ambulatory Visit: Payer: Self-pay | Admitting: Family Medicine

## 2013-03-10 ENCOUNTER — Ambulatory Visit (INDEPENDENT_AMBULATORY_CARE_PROVIDER_SITE_OTHER): Payer: Medicare Other

## 2013-03-10 DIAGNOSIS — Z23 Encounter for immunization: Secondary | ICD-10-CM

## 2013-03-16 ENCOUNTER — Ambulatory Visit (INDEPENDENT_AMBULATORY_CARE_PROVIDER_SITE_OTHER): Payer: Medicare Other

## 2013-03-16 DIAGNOSIS — Z2911 Encounter for prophylactic immunotherapy for respiratory syncytial virus (RSV): Secondary | ICD-10-CM

## 2013-03-16 DIAGNOSIS — Z23 Encounter for immunization: Secondary | ICD-10-CM

## 2013-03-16 NOTE — Progress Notes (Signed)
  Subjective:    Patient ID: Stacy Davis, female    DOB: 01-03-1946, 67 y.o.   MRN: [id number]  HPI    Review of Systems     Objective:   Physical Exam        Assessment & Plan:  Pt had flu shot on 03/10/13 and should wait 30 days in between flu shot and pneumovax. Pt will reschedule for pneumovax.

## 2013-03-30 ENCOUNTER — Telehealth: Payer: Self-pay | Admitting: Family Medicine

## 2013-03-30 NOTE — Telephone Encounter (Signed)
She had seen Dr. Elease Etienne.  I would run this through him first (she shouldn't need a referral).  If she wants a second opinion, then we can refer if needed.  Let me know if she needs a referral.  Thanks.

## 2013-03-30 NOTE — Telephone Encounter (Signed)
Patient will call for an appt with Dr. Dion Saucier and will try to schedule with him at the [facility] office.  She wasn't sure if she needed to see him because she is hurting all over and thinks it is arthritis.  So is this the right track to follow?

## 2013-03-30 NOTE — Telephone Encounter (Signed)
I would start there.  He's a good doc and if he can't provide enough advice then he likely could offer a direction to go.

## 2013-03-30 NOTE — Telephone Encounter (Signed)
Pt says her arthritis is bothering her worse and would like to be referred out to a specialist. Can you place a referral or does pt need to make an appmt first? Pt would prefer to be sent to [facility] if possible. Thank you.

## 2013-04-15 ENCOUNTER — Ambulatory Visit (INDEPENDENT_AMBULATORY_CARE_PROVIDER_SITE_OTHER): Payer: Medicare Other | Admitting: Family Medicine

## 2013-04-15 ENCOUNTER — Encounter: Payer: Self-pay | Admitting: Family Medicine

## 2013-04-15 VITALS — BP 128/78 | HR 68 | Temp 97.4°F | Wt 163.2 lb

## 2013-04-15 DIAGNOSIS — M2550 Pain in unspecified joint: Secondary | ICD-10-CM

## 2013-04-15 DIAGNOSIS — R413 Other amnesia: Secondary | ICD-10-CM

## 2013-04-15 DIAGNOSIS — E119 Type 2 diabetes mellitus without complications: Secondary | ICD-10-CM

## 2013-04-15 DIAGNOSIS — E039 Hypothyroidism, unspecified: Secondary | ICD-10-CM

## 2013-04-15 NOTE — Patient Instructions (Signed)
Go to the lab on the way out.  We'll contact you with your lab report. Stacy Davis will call about your referral. Take the mobic twice a day with food in the meantime.   Take care.

## 2013-04-15 NOTE — Progress Notes (Signed)
Pre-visit discussion using our clinic review tool. No additional management support is needed unless otherwise documented below in the visit note.   Diffuse aches, hands and feet.  Not taking meloxicam every day.  Had not been remembering to take it consistently.  Trouble making a fist, esp in L hand.  She's had dorsal midfoot pain.  No joint erythema noted by patient but dorsum of feet had been puffy.  Aching pain, not a burning pain.  Noted to be worse with weather changes.  L thumb and L middle finger are most painful.  No injury to cause this.    She has some L hip pain, trouble crossing her L leg over her right.  This is more recent than the hand pain.    Didn't have OSA eval.  Discussed memory implications.  She still declined OSA eval.    Hypothyroid.  Compliant with meds.  No ADE.  Due for TSH.  See notes on labs.    DM2.  Sugar ~100 usually in AM.  She is overdue for labs, didn't come back until today.  Compliant with meds per patient.    PMH and SH reviewed  ROS: See HPI, otherwise noncontributory.  Meds, vitals, and allergies reviewed.   nad ncat Mmm No TMG rrr ctab abd soft, not ttp Ext w/o edema Chronic changes on L 3rd finger w/o acute erythema at the IP joints, pain with flexion.   Diabetic foot exam: Normal inspection No skin breakdown No calluses  Normal DP pulses Normal sensation to light touch and monofilament Nails normal

## 2013-04-15 NOTE — Assessment & Plan Note (Signed)
See notes on labs. 

## 2013-04-15 NOTE — Assessment & Plan Note (Signed)
rec mobic as directed with food.  Likely OA but refer for rheum input.

## 2013-04-15 NOTE — Assessment & Plan Note (Signed)
Declined OSA eval.

## 2013-04-16 ENCOUNTER — Encounter: Payer: Self-pay | Admitting: *Deleted

## 2013-04-16 LAB — MICROALBUMIN / CREATININE URINE RATIO
Creatinine,U: 237.3 mg/dL
Microalb Creat Ratio: 0.3 mg/g (ref 0.0–30.0)
Microalb, Ur: 0.8 mg/dL (ref 0.0–1.9)

## 2013-04-16 LAB — TSH: TSH: 0.39 u[IU]/mL (ref 0.35–5.50)

## 2013-04-16 LAB — HEMOGLOBIN A1C: Hgb A1c MFr Bld: 6 % (ref 4.6–6.5)

## 2013-04-20 ENCOUNTER — Ambulatory Visit (INDEPENDENT_AMBULATORY_CARE_PROVIDER_SITE_OTHER): Payer: Medicare Other

## 2013-04-20 DIAGNOSIS — Z23 Encounter for immunization: Secondary | ICD-10-CM

## 2013-04-27 ENCOUNTER — Other Ambulatory Visit: Payer: Self-pay | Admitting: Family Medicine

## 2013-04-27 NOTE — Telephone Encounter (Signed)
Printed

## 2013-04-27 NOTE — Telephone Encounter (Signed)
Pt left note requesting oxycodone apap rx. Call when ready for pick up.

## 2013-04-27 NOTE — Telephone Encounter (Signed)
Electronic refill request.  Please advise. 

## 2013-04-27 NOTE — Telephone Encounter (Signed)
Sent!

## 2013-04-28 NOTE — Telephone Encounter (Signed)
Rx called into pharmacy-[facility] 

## 2013-04-28 NOTE — Telephone Encounter (Signed)
Disregard previous message. Patient notified that script is up front and ready to be picked up. Pharmacist Tawanna Cooler) notified to disregard the call in script.

## 2013-04-30 ENCOUNTER — Encounter: Payer: Self-pay | Admitting: Family Medicine

## 2013-05-22 ENCOUNTER — Other Ambulatory Visit: Payer: Self-pay | Admitting: Family Medicine

## 2013-05-24 NOTE — Telephone Encounter (Signed)
Electronic refill request.  Please advise. 

## 2013-05-24 NOTE — Telephone Encounter (Signed)
Please call in

## 2013-05-24 NOTE — Telephone Encounter (Signed)
Medication phoned to pharmacy.  

## 2013-05-25 ENCOUNTER — Other Ambulatory Visit: Payer: Self-pay | Admitting: Family Medicine

## 2013-05-26 ENCOUNTER — Encounter: Payer: Self-pay | Admitting: Family Medicine

## 2013-06-14 ENCOUNTER — Encounter: Payer: Self-pay | Admitting: Family Medicine

## 2013-07-22 ENCOUNTER — Encounter: Payer: Self-pay | Admitting: Family Medicine

## 2013-07-30 ENCOUNTER — Other Ambulatory Visit: Payer: Self-pay | Admitting: Family Medicine

## 2013-08-16 ENCOUNTER — Other Ambulatory Visit: Payer: Self-pay | Admitting: Family Medicine

## 2013-09-22 ENCOUNTER — Other Ambulatory Visit: Payer: Self-pay | Admitting: Family Medicine

## 2013-10-08 ENCOUNTER — Other Ambulatory Visit: Payer: Self-pay | Admitting: Family Medicine

## 2013-10-08 NOTE — Telephone Encounter (Signed)
Electronic refill request. Last Filled:   30 tablet 0RF on 04/27/2013.  Please advise.

## 2013-10-09 NOTE — Telephone Encounter (Signed)
Printed, due for DM2 f/u.  Needs labs then OV.

## 2013-10-11 NOTE — Telephone Encounter (Signed)
Husband advised.  Rx left at front desk for pick up.  

## 2013-10-14 ENCOUNTER — Other Ambulatory Visit: Payer: Medicare Other

## 2013-10-21 ENCOUNTER — Ambulatory Visit (INDEPENDENT_AMBULATORY_CARE_PROVIDER_SITE_OTHER): Payer: Medicare Other | Admitting: Family Medicine

## 2013-10-21 ENCOUNTER — Encounter: Payer: Self-pay | Admitting: Family Medicine

## 2013-10-21 VITALS — BP 112/80 | HR 87 | Temp 98.0°F | Wt 171.0 lb

## 2013-10-21 DIAGNOSIS — R413 Other amnesia: Secondary | ICD-10-CM

## 2013-10-21 DIAGNOSIS — E119 Type 2 diabetes mellitus without complications: Secondary | ICD-10-CM

## 2013-10-21 NOTE — Progress Notes (Signed)
Pre visit review using our clinic review tool, if applicable. No additional management support is needed unless otherwise documented below in the visit note.  Diabetes:  Using medications without difficulties: yes Hypoglycemic episodes:no sx  Hyperglycemic episodes: no sx Feet problems: no Blood Sugars averaging: controlled per patient until she ran out of strips.   eye exam within last year: done last 11/2012  She still complains about memory troubles.  She still declined repeat OSA testing.  Discussed again, encouraged to consider.  She was asking her husband about the appointment mult times today.  I again told them she may have a treatable condition but my hands are tied in the meantime.  3/3 on recall today. Oriented to month, date.  Rarely using oxycodone, ativan, flexeril, and ambien, so the meds don't appear to be contributing.    PMH and SH reviewed  Meds, vitals, and allergies reviewed.   ROS: See HPI.  Otherwise negative.    GEN: nad, alert and oriented to day, month HEENT: mucous membranes moist NECK: supple w/o LA CV: rrr. PULM: ctab, no inc wob ABD: soft, +bs EXT: no edema SKIN: no acute rash  Diabetic foot exam: Normal inspection No skin breakdown No calluses  Normal DP pulses Normal sensation to light touch and monofilament Nails normal

## 2013-10-21 NOTE — Patient Instructions (Addendum)
Go to the lab on the way out.  We'll contact you with your lab report.  Call [facility] about getting your memory checked.  It doesn't appear that your meds are contributing.   I can't support your driving if your memory is getting worse.

## 2013-10-22 LAB — BASIC METABOLIC PANEL
BUN: 19 mg/dL (ref 6–23)
CO2: 25 mEq/L (ref 19–32)
Calcium: 9.2 mg/dL (ref 8.4–10.5)
Chloride: 106 mEq/L (ref 96–112)
Creatinine, Ser: 1.2 mg/dL (ref 0.4–1.2)
GFR: 49.61 mL/min — ABNORMAL LOW (ref >60.00)
Glucose, Bld: 131 mg/dL — ABNORMAL HIGH (ref 70–99)
Potassium: 4.1 mEq/L (ref 3.5–5.1)
Sodium: 140 mEq/L (ref 135–145)

## 2013-10-22 LAB — HEMOGLOBIN A1C: Hgb A1c MFr Bld: 6.2 % (ref 4.6–6.5)

## 2013-10-22 NOTE — Assessment & Plan Note (Signed)
See notes on labs. 

## 2013-10-22 NOTE — Assessment & Plan Note (Signed)
She still declined repeat OSA testing. Discussed again, encouraged to consider along with neuro f/u. She was asking her husband about the appointment mult times today. I again told them she may have a treatable condition but my hands are tied in the meantime since she doesn't want OSA eval or f/u with neuro. 3/3 on recall today. Oriented to month, date. Rarely using oxycodone, ativan, flexeril, and ambien, so the meds don't appear to be contributing.  She is reportedly worsening.  I would expect her to continue to worsen until she can no longer make her own choices- I told her that no avail. She does appear to be able to make informed consent at this point.  >25 minutes spent in face to face time with patient, >50% spent in counselling or coordination of care.  I hope she'll consider OSA testing/follow up with neuro.

## 2013-11-02 ENCOUNTER — Other Ambulatory Visit: Payer: Self-pay | Admitting: Family Medicine

## 2013-11-02 NOTE — Telephone Encounter (Signed)
Electronic refill request.  Last Filled:   40 tablet 2 RF on   04/27/2013.  Please advise.

## 2013-11-02 NOTE — Telephone Encounter (Signed)
Sent. Thanks.   

## 2013-11-04 DIAGNOSIS — D099 Carcinoma in situ, unspecified: Secondary | ICD-10-CM

## 2013-11-04 HISTORY — DX: Carcinoma in situ, unspecified: D09.9

## 2013-12-06 ENCOUNTER — Other Ambulatory Visit: Payer: Self-pay | Admitting: Family Medicine

## 2013-12-27 ENCOUNTER — Other Ambulatory Visit: Payer: Self-pay | Admitting: *Deleted

## 2013-12-27 MED ORDER — METOPROLOL TARTRATE 25 MG PO TABS: ORAL_TABLET | ORAL | Status: DC

## 2014-02-14 ENCOUNTER — Other Ambulatory Visit: Payer: Self-pay | Admitting: Family Medicine

## 2014-02-16 ENCOUNTER — Telehealth: Payer: Self-pay | Admitting: Family Medicine

## 2014-02-16 DIAGNOSIS — Z0100 Encounter for examination of eyes and vision without abnormal findings: Secondary | ICD-10-CM

## 2014-02-16 NOTE — Telephone Encounter (Signed)
Patient notified by telephone that the referral has been placed and the referral coordinator will be in touch with her to get this set up.

## 2014-02-16 NOTE — Telephone Encounter (Signed)
Ordered. Thanks

## 2014-02-16 NOTE — Telephone Encounter (Signed)
Pt called requesting referral to [facility]. She states it has been a while since her last eye exam and needs to get them checked.

## 2014-03-10 LAB — HM DIABETES EYE EXAM

## 2014-03-17 ENCOUNTER — Encounter: Payer: Self-pay | Admitting: Family Medicine

## 2014-03-23 ENCOUNTER — Ambulatory Visit (INDEPENDENT_AMBULATORY_CARE_PROVIDER_SITE_OTHER): Payer: Commercial Managed Care - HMO

## 2014-03-23 DIAGNOSIS — Z23 Encounter for immunization: Secondary | ICD-10-CM

## 2014-04-05 ENCOUNTER — Other Ambulatory Visit: Payer: Self-pay | Admitting: Family Medicine

## 2014-04-05 NOTE — Telephone Encounter (Signed)
Last Filled:   10/09/13.  Please advise.

## 2014-04-06 NOTE — Telephone Encounter (Signed)
Printed.  Thanks.  

## 2014-04-06 NOTE — Telephone Encounter (Signed)
Patient notified by telephone that script is up front ready for pickup. 

## 2014-04-27 ENCOUNTER — Other Ambulatory Visit: Payer: Self-pay | Admitting: Family Medicine

## 2014-04-27 NOTE — Telephone Encounter (Signed)
please call in.  Thanks.   

## 2014-04-27 NOTE — Telephone Encounter (Signed)
Received refill request electronically from pharmacy. Last refill 10/21/13 #30/2 refills. Last office visit 10/21/13. Is it okay to refill medication?

## 2014-04-28 NOTE — Telephone Encounter (Signed)
Called to [facility].

## 2014-05-10 ENCOUNTER — Telehealth: Payer: Self-pay | Admitting: Family Medicine

## 2014-05-10 DIAGNOSIS — R413 Other amnesia: Secondary | ICD-10-CM

## 2014-05-10 NOTE — Telephone Encounter (Signed)
Patient and her husband came in to ask me to do a referral to [facility] clinic at [facility]. I told them that their insurance wouldn't cover b/c they would be out of network as they will be changing to [place]. Ms Nase is very frustrated because she wants to do something about her memory problems and said she doesn't want to go thru a sleep study again. Mr Rettinger said that you wouldn't address the memory problem until she had a sleep study. They would like a referral to [facility] because Dr Verdene Rio there does memory and alzheimers problems and would be in the network for their new insurance. Would you please put referral in for Dr Verdene Rio [facility].

## 2014-05-10 NOTE — Telephone Encounter (Signed)
I'm glad to refer her out, but the fact remains that she could have ongoing OSA that could have an effect on a lot of her symptoms- memory, HTN, diabetes.  She had already seen neuro locally and they rec'd eval and possible tx for OSA.  She declined neuro f/u at the [facility] here on 10/22/13.  All that stated, I put in the referral.

## 2014-05-12 NOTE — Telephone Encounter (Signed)
Appt made with Dr Verdene Rio and patient aware.

## 2014-05-30 ENCOUNTER — Other Ambulatory Visit: Payer: Self-pay | Admitting: Family Medicine

## 2014-06-06 ENCOUNTER — Other Ambulatory Visit: Payer: Self-pay | Admitting: Family Medicine

## 2014-06-07 ENCOUNTER — Telehealth: Payer: Self-pay | Admitting: Family Medicine

## 2014-06-07 NOTE — Telephone Encounter (Signed)
Margarita Grizzle from the [facility] called to inform that patient did not show for her appointment 06/06/14 and the clinic will not be rescheduling her appointment.  Number for [facility] is [phone]

## 2014-06-07 NOTE — Telephone Encounter (Signed)
Noted, thanks!

## 2014-06-13 ENCOUNTER — Telehealth: Payer: Self-pay | Admitting: *Deleted

## 2014-06-13 NOTE — Telephone Encounter (Signed)
Letter received from patient's insurance, [employer], stating that they would not cover Pantoprazole for more than 30 tablets per 30 days.  Patient is currently prescribed one tablet twice daily.   Patient states she is doing fine with this problem and is not opposed to trying to take 1 tablet per day.  Please advise.

## 2014-06-14 MED ORDER — PANTOPRAZOLE SODIUM 40 MG PO TBEC: 40.0000 mg | DELAYED_RELEASE_TABLET | Freq: Every day | ORAL | Status: DC

## 2014-06-14 NOTE — Telephone Encounter (Signed)
If she is doing well with QD dosing, then I'm okay with that.  I sent a new rx with the new sig.  Thanks.

## 2014-07-04 ENCOUNTER — Other Ambulatory Visit: Payer: Self-pay | Admitting: Family Medicine

## 2014-07-04 NOTE — Telephone Encounter (Signed)
Patient last seen:  10/21/13.  Electronic refill request.  Cyclobenzaprine Last Filled:    40 tablet 1 RF on 11/02/2013  Oxycodone Last Filled:    30 tablet 0 RF on 04/06/2014  Please advise.

## 2014-07-05 NOTE — Telephone Encounter (Signed)
Printed.  She is due for f/u.  Please schedule 27min appointment, labs ahead of time.  Thanks.

## 2014-07-05 NOTE — Telephone Encounter (Signed)
Patient advised. Rx left at front desk for pick up.   Patient says she will schedule OV with labs prior when she picks up the Rx.

## 2014-07-08 ENCOUNTER — Ambulatory Visit (INDEPENDENT_AMBULATORY_CARE_PROVIDER_SITE_OTHER): Payer: PPO | Admitting: Family Medicine

## 2014-07-08 ENCOUNTER — Ambulatory Visit (INDEPENDENT_AMBULATORY_CARE_PROVIDER_SITE_OTHER)
Admission: RE | Admit: 2014-07-08 | Discharge: 2014-07-08 | Disposition: A | Discharge status: Home / Self Care | Payer: PPO | Source: Ambulatory Visit | Attending: Family Medicine | Admitting: Family Medicine

## 2014-07-08 ENCOUNTER — Encounter: Payer: Self-pay | Admitting: Family Medicine

## 2014-07-08 VITALS — BP 130/88 | HR 93 | Temp 98.0°F | Wt 178.2 lb

## 2014-07-08 DIAGNOSIS — R413 Other amnesia: Secondary | ICD-10-CM

## 2014-07-08 DIAGNOSIS — M545 Low back pain: Secondary | ICD-10-CM

## 2014-07-08 DIAGNOSIS — E119 Type 2 diabetes mellitus without complications: Secondary | ICD-10-CM

## 2014-07-08 LAB — COMPREHENSIVE METABOLIC PANEL
ALT: 16 U/L (ref 0–35)
AST: 25 U/L (ref 0–37)
Albumin: 4.4 g/dL (ref 3.5–5.2)
Alkaline Phosphatase: 70 U/L (ref 39–117)
BUN: 20 mg/dL (ref 6–23)
CO2: 29 mEq/L (ref 19–32)
Calcium: 10.2 mg/dL (ref 8.4–10.5)
Chloride: 104 mEq/L (ref 96–112)
Creatinine, Ser: 1.04 mg/dL (ref 0.40–1.20)
GFR: 56.15 mL/min — ABNORMAL LOW (ref >60.00)
Glucose, Bld: 101 mg/dL — ABNORMAL HIGH (ref 70–99)
Potassium: 4.7 mEq/L (ref 3.5–5.1)
Sodium: 140 mEq/L (ref 135–145)
Total Bilirubin: 0.4 mg/dL (ref 0.2–1.2)
Total Protein: 7.7 g/dL (ref 6.0–8.3)

## 2014-07-08 LAB — HEMOGLOBIN A1C: Hgb A1c MFr Bld: 6.3 % (ref 4.6–6.5)

## 2014-07-08 LAB — TSH: TSH: 16.31 u[IU]/mL — ABNORMAL HIGH (ref 0.35–4.50)

## 2014-07-08 LAB — LIPID PANEL
Cholesterol: 294 mg/dL — ABNORMAL HIGH (ref 0–200)
HDL: 45.6 mg/dL (ref >39.00)
Total CHOL/HDL Ratio: 6
Triglycerides: 480 mg/dL — ABNORMAL HIGH (ref 0.0–149.0)

## 2014-07-08 LAB — LDL CHOLESTEROL, DIRECT: Direct LDL: 164 mg/dL

## 2014-07-08 LAB — MICROALBUMIN / CREATININE URINE RATIO
Creatinine,U: 237.8 mg/dL
Microalb Creat Ratio: 0.9 mg/g (ref 0.0–30.0)
Microalb, Ur: 2.2 mg/dL — ABNORMAL HIGH (ref 0.0–1.9)

## 2014-07-08 NOTE — Progress Notes (Signed)
Pre visit review using our clinic review tool, if applicable. No additional management support is needed unless otherwise documented below in the visit note.  History per patient and husband, as she can't recall all pertinent details.  Inc in back pain recently, over the last 1-2 months.  Some days are better than others, occ needs help getting out of bed.  Had to use a cane some, no falls.  Taking prn oxycodone and flexeril with some relief, not used daily.  Mainly in the lower L spine, then radiates upward, occ with pain on the L side of lower back.  Usually doesn't have R sided sx.  Pain in midline of back is constant, L lower back pain is more intermittent.  No new leg sx, she has had R proximal leg pain at baseline.  No leg weakness.  No bowel or bladder sx.  H/o L spine surgery back in 2010.  No clear trigger for the worsening of sx recently, other than picking up some small items at home, 5lbs at max.  She hasn't been in PT recently.  She was walking more until the cold winter weather.  Taking PRN meloxicam, not daily.    DM2. Sugar has been "normal" recently.  Due for labs.   Memory loss.  She had asked for a specific neurology second opinion, then just didn't show up for the appointment.  When I asked what happened, she and her husband said, "We didn't go."  They didn't elaborate on the rationale for asking for a second opinion and then not going.  There is still the issue of potentially untreated OSA that could be affecting her in multiple ways.  She doesn't want eval or treatment for that.   Patient is now on prevagen aka apoaequorin.  I admitted that I hadn't heard of the supplement and didn't know what was in the pills.  We discussed that since it isn't regulated, she and her husband don't know what is in the pills, either.  She states that she is much improved, but she can't recall details about her illness as above and relies on her husband during the interview.    She has a recent R lower  eyelid lesion.  It is resolving now, not painful, no discharge.    Meds, vitals, and allergies reviewed.   ROS: See HPI.  Otherwise, noncontributory.  nad but has trouble with recall of recent events, esp re: back pain.  Ncat, small (~59mm) lesion on R lower eyelid, initially not seen, noted on closer inspection.  No FB, no local irritation, no drainage.  OP wnl Neck supple, no LA rrr ctab abd soft, not ttp Ext w/o edema Back w/o midline pain on palpation, neg SI testing but pain with twisting.  No pain with back flex/ext.  Distally NV intact in the BLE ext.

## 2014-07-08 NOTE — Patient Instructions (Addendum)
Go to the lab on the way out (for labs and xrays) and then schedule a physical before leaving. Start back taking meloxicam daily with food.  We'll be in touch.  PT will likely be the next step for your back pain.

## 2014-07-10 ENCOUNTER — Other Ambulatory Visit: Payer: Self-pay | Admitting: Family Medicine

## 2014-07-10 DIAGNOSIS — E039 Hypothyroidism, unspecified: Secondary | ICD-10-CM

## 2014-07-10 NOTE — Assessment & Plan Note (Signed)
She had asked for a specific neurology second opinion, then just didn't show up for the appointment. When I asked what happened, she and her husband said, "We didn't go." They didn't elaborate on the rationale for asking for a second opinion and then not going. There is still the issue of potentially untreated OSA that could be affecting her in multiple ways. She doesn't want eval or treatment for that. Patient is now on prevagen aka apoaequorin. I admitted that I hadn't heard of the supplement and didn't know what was in the pills. We discussed that since it isn't regulated, she and her husband don't know what is in the pills, either. She states that she is much improved, but she can't recall details about her illness as above and relies on her husband during the interview.   See notes on labs.  >25 minutes spent in face to face time with patient, >50% spent in counselling or coordination of care.

## 2014-07-10 NOTE — Assessment & Plan Note (Signed)
See notes on labs. 

## 2014-07-10 NOTE — Addendum Note (Signed)
Addended by: Tonia Ghent on: 07/10/2014 05:56 PM   Modules accepted: Orders

## 2014-07-10 NOTE — Assessment & Plan Note (Signed)
See notes on imaging.  Advised to restart mobic in the meantime.

## 2014-07-11 ENCOUNTER — Encounter: Payer: Self-pay | Admitting: *Deleted

## 2014-07-25 ENCOUNTER — Telehealth: Payer: Self-pay | Admitting: *Deleted

## 2014-07-25 NOTE — Telephone Encounter (Signed)
Received a letter last from pt's insurance that cyclobenzaprine requires prior auth. I completed prior auth questionnaire online using CoverMyMeds, and we're currently pending response from insurance company. PA request, and forms are at my desk, awaiting insurance determination.

## 2014-07-27 ENCOUNTER — Ambulatory Visit (INDEPENDENT_AMBULATORY_CARE_PROVIDER_SITE_OTHER): Payer: PPO | Admitting: Family Medicine

## 2014-07-27 ENCOUNTER — Encounter: Payer: Self-pay | Admitting: Family Medicine

## 2014-07-27 VITALS — BP 118/82 | HR 82 | Temp 98.2°F | Wt 179.8 lb

## 2014-07-27 DIAGNOSIS — E119 Type 2 diabetes mellitus without complications: Secondary | ICD-10-CM

## 2014-07-27 DIAGNOSIS — E039 Hypothyroidism, unspecified: Secondary | ICD-10-CM

## 2014-07-27 DIAGNOSIS — R809 Proteinuria, unspecified: Secondary | ICD-10-CM

## 2014-07-27 DIAGNOSIS — E1129 Type 2 diabetes mellitus with other diabetic kidney complication: Secondary | ICD-10-CM

## 2014-07-27 DIAGNOSIS — R413 Other amnesia: Secondary | ICD-10-CM

## 2014-07-27 MED ORDER — LISINOPRIL 2.5 MG PO TABS: 2.5000 mg | ORAL_TABLET | Freq: Every day | ORAL | Status: DC

## 2014-07-27 NOTE — Progress Notes (Signed)
Pre visit review using our clinic review tool, if applicable. No additional management support is needed unless otherwise documented below in the visit note.  DM2 f/u.  A1c at goal, MALB pos now.  D/w pt and husband re: path/phys and ACE use.  BP not elevated.    Memory loss.  MMSE 22/30, -5 for orientation, doesn't even know the year.  0/3 recall.   D/w pt's husband that she can't manage her meds. Offered neuro referral, declined.   Hypothyroidism.  TSH up, likely had missed doses.  D/w pt and husband re: labs.  No neck mass.   Meds, vitals, and allergies reviewed.   ROS: See HPI.  Otherwise, noncontributory.  nad ncat Mmm Neck supple, no TMG rrr ctab abd soft, not ttp Ext w/o edema

## 2014-07-27 NOTE — Patient Instructions (Signed)
Daily oversight of medicine.  Start lisinopril to protect your kidneys.  If you want to go back to neurology about your memory then let me know.  Recheck thyroid test in about 2 months.  Take care.  Glad to see you.

## 2014-07-28 NOTE — Telephone Encounter (Signed)
I called [employer] to check the status of the prior auth. I was told by [facility] a representative that the cyclobenzaprine has been approved from 07/22/14-05/27/15. PA forms sent to be scanned.

## 2014-07-28 NOTE — Telephone Encounter (Signed)
Thanks

## 2014-07-29 NOTE — Assessment & Plan Note (Signed)
A1c at goal, MALB pos now.  D/w pt and husband re: path/phys and ACE use.  BP not elevated.  rx sent. >25 minutes spent in face to face time with patient, >50% spent in counselling or coordination of care.

## 2014-07-29 NOTE — Assessment & Plan Note (Addendum)
MMSE 22/30, -5 for orientation, doesn't even know the year.  0/3 recall.   D/w pt's husband that she can't manage her meds. Offered neuro referral, declined.  It is up to them to consider accepting some form of treatment.  Will continue to monitor.  Husband will have to supervise patient.  She isn't driving.

## 2014-07-29 NOTE — Assessment & Plan Note (Signed)
TSH up, likely had missed doses.  D/w pt and husband re: labs.  No neck mass.  He'll have to monitor her med use and recheck TSH in about 2 months.

## 2014-08-16 ENCOUNTER — Telehealth: Payer: Self-pay | Admitting: *Deleted

## 2014-08-16 NOTE — Telephone Encounter (Signed)
Note from pharmacy indicates that patient's insurance no longer covers Protonix.  Pharmacy asking if medication can be switched to something else?  Request in In Box.

## 2014-08-17 MED ORDER — OMEPRAZOLE 40 MG PO CPDR: 40.0000 mg | DELAYED_RELEASE_CAPSULE | Freq: Every day | ORAL | Status: DC

## 2014-08-17 NOTE — Telephone Encounter (Signed)
Can change to prilosec, rx sent.  Thanks.

## 2014-08-26 ENCOUNTER — Other Ambulatory Visit: Payer: Self-pay | Admitting: Family Medicine

## 2014-08-26 NOTE — Telephone Encounter (Signed)
Electronic refill request. Last Filled:    40 tablet 0 RF on 07/05/2014  Please advise.

## 2014-08-28 NOTE — Telephone Encounter (Signed)
Sent. Thanks.   

## 2014-09-08 ENCOUNTER — Other Ambulatory Visit (INDEPENDENT_AMBULATORY_CARE_PROVIDER_SITE_OTHER): Payer: PPO

## 2014-09-08 DIAGNOSIS — E039 Hypothyroidism, unspecified: Secondary | ICD-10-CM

## 2014-09-08 LAB — TSH: TSH: 3.82 u[IU]/mL (ref 0.35–4.50)

## 2014-09-12 ENCOUNTER — Ambulatory Visit: Payer: PPO | Admitting: Family Medicine

## 2014-10-06 ENCOUNTER — Ambulatory Visit (INDEPENDENT_AMBULATORY_CARE_PROVIDER_SITE_OTHER): Payer: PPO | Admitting: Family Medicine

## 2014-10-06 ENCOUNTER — Encounter: Payer: Self-pay | Admitting: Family Medicine

## 2014-10-06 VITALS — BP 102/72 | HR 68 | Temp 98.6°F | Ht 62.0 in | Wt 177.8 lb

## 2014-10-06 DIAGNOSIS — R809 Proteinuria, unspecified: Secondary | ICD-10-CM

## 2014-10-06 DIAGNOSIS — Z7189 Other specified counseling: Secondary | ICD-10-CM | POA: Insufficient documentation

## 2014-10-06 DIAGNOSIS — E785 Hyperlipidemia, unspecified: Secondary | ICD-10-CM

## 2014-10-06 DIAGNOSIS — R413 Other amnesia: Secondary | ICD-10-CM | POA: Diagnosis not present

## 2014-10-06 DIAGNOSIS — Z0000 Encounter for general adult medical examination without abnormal findings: Secondary | ICD-10-CM

## 2014-10-06 DIAGNOSIS — E1129 Type 2 diabetes mellitus with other diabetic kidney complication: Secondary | ICD-10-CM

## 2014-10-06 DIAGNOSIS — Z23 Encounter for immunization: Secondary | ICD-10-CM | POA: Diagnosis not present

## 2014-10-06 DIAGNOSIS — E119 Type 2 diabetes mellitus without complications: Secondary | ICD-10-CM

## 2014-10-06 MED ORDER — MELOXICAM 15 MG PO TABS
15.0000 mg | ORAL_TABLET | Freq: Every day | ORAL | Status: DC
Start: 2014-10-06 — End: 2018-03-06

## 2014-10-06 NOTE — Assessment & Plan Note (Addendum)
Long standing. Not improved, worsened. She can't manage her meds, d/w pt and husband.  She doesn't know month or year.  We have tried to refer to neuro at the request of the patient/husband, but they didn't show up for the appointment and never gave a reason for not going, even when I asked about it at the [facility] today.  Now asking for neuro eval again  See AVS.  They'll have to call and re-est with [facility].  She still may have untreated sleep apnea and that may be contributing to this and her other medical problems.  All d/w pt/husband.  >25 minutes spent in face to face time with patient, >50% spent in counselling or coordination of care separate from the medicare wellness exam.   Prev MMSE noted, 22/30 on 06/2014.

## 2014-10-06 NOTE — Patient Instructions (Addendum)
Call about an eye exam.   Check with your insurance to see if they will cover the tetanus shot. Call about a mammogram.  Let me know if you want to get a bone density test.   Call over to [facility] and get an appointment.  If you need a referral, then let us know.  [phone].  Recheck labs before a visit in about 3 months.

## 2014-10-06 NOTE — Assessment & Plan Note (Signed)
D/w pt.  Prev elevated, known DM2.  At this point, I want her to reest with neuro before we add on any other meds.  D/w them that statin would be indicated.

## 2014-10-06 NOTE — Progress Notes (Signed)
Pre visit review using our clinic review tool, if applicable. No additional management support is needed unless otherwise documented below in the visit note.  I have personally reviewed the Medicare Annual Wellness questionnaire and have noted 1. The patient's medical and social history 2. Their use of alcohol, tobacco or illicit drugs 3. Their current medications and supplements 4. The patient's functional ability including ADL's, fall risks, home safety risks and hearing or visual             impairment. 5. Diet and physical activities 6. Evidence for depression or mood disorders  The patients weight, height, BMI have been recorded in the chart and visual acuity is per eye clinic.  I have made referrals, counseling and provided education to the patient based review of the above and I have provided the pt with a written personalized care plan for preventive services.  Provider list updated- see scanned forms.  Routine anticipatory guidance given to patient.  See health maintenance.  Flu 2015 Shingles 2014 PNA 2014 Tetanus dw pt.  See AVS.  Colonoscopy 2007 Breast cancer screening- she/husband can call about mammogram.  She'll consider DXA.  D/w pt and husband.  Advance directive - husband designated if patient were incapacitated.  Cognitive function addressed- see scanned forms- and if abnormal then additional documentation follows.   Memory loss.  Long standing. Not improved. She can't manage her meds, d/w pt and husband.  She doesn't know month or year.  We have tried to refer to neuro at the request of the patient/husband, but they didn't show up for the appointment and never gave a reason for not going, even when I asked about it at the [facility] today.  Now asking for neuro eval again  See AVS.  They'll have to call and re-est with [facility].  She still may have untreated sleep apnea and that may be contributing to this and her other medical problems.    HLD.  D/w pt.  Prev elevated,  known DM2.  At this point, I want her to reest with neuro before we add on any other meds.  D/w them that statin would be indicated.   PMH and SH reviewed  Meds, vitals, and allergies reviewed.   ROS: See HPI.  Otherwise negative.    GEN: nad, alert but not oriented to year.  HEENT: mucous membranes moist NECK: supple w/o LA CV: rrr. PULM: ctab, no inc wob ABD: soft, +bs EXT: no edema SKIN: no acute rash

## 2014-10-06 NOTE — Assessment & Plan Note (Signed)
Flu 2015 Shingles 2014 PNA 2014 Tetanus dw pt.  See AVS.  Colonoscopy 2007 Breast cancer screening- she/husband can call about mammogram.  She'll consider DXA.  D/w pt and husband.  Advance directive - husband designated if patient were incapacitated.  Cognitive function addressed- see scanned forms- and if abnormal then additional documentation follows.

## 2014-10-06 NOTE — Assessment & Plan Note (Signed)
D/w pt/husband about prev adding ACE, continue for now.  Recheck A1c fall 2016.

## 2014-10-11 ENCOUNTER — Other Ambulatory Visit: Payer: Self-pay | Admitting: Family Medicine

## 2014-10-12 ENCOUNTER — Telehealth: Payer: Self-pay | Admitting: Family Medicine

## 2014-10-12 DIAGNOSIS — R413 Other amnesia: Secondary | ICD-10-CM

## 2014-10-12 NOTE — Telephone Encounter (Signed)
Patient notified as instructed by telephone and verbalized understanding. Advised patient that the referral coordinator will be in touch with her to get this set up.

## 2014-10-12 NOTE — Telephone Encounter (Signed)
I'll refer but the last time we set up an appointment they didn't show.  If they do that again, then I won't refer a 3rd time.  Ordered.

## 2014-10-12 NOTE — Addendum Note (Signed)
Addended by: Tonia Ghent on: 10/12/2014 05:13 PM   Modules accepted: Orders

## 2014-10-12 NOTE — Telephone Encounter (Signed)
Patient tried to call [facility] to schedule appointment, but they told her she'd need a referral from [facility].

## 2014-10-13 ENCOUNTER — Other Ambulatory Visit: Payer: Self-pay | Admitting: Family Medicine

## 2014-10-13 NOTE — Telephone Encounter (Signed)
Printed.  Thanks.  

## 2014-10-13 NOTE — Telephone Encounter (Signed)
Electronic refill request. Last Filled:    30 tablet 0 RF on 07/05/2014  Please advise.

## 2014-10-14 ENCOUNTER — Telehealth: Payer: Self-pay

## 2014-10-14 NOTE — Telephone Encounter (Signed)
Rx left in front office for pick up and pt is aware  

## 2014-10-14 NOTE — Telephone Encounter (Signed)
Pt walked in; pt wants to know name of shot she received on 10/06/14. Gave pt copy of immunization record; pt received prevnar 13. That was all pt needed.

## 2014-10-25 ENCOUNTER — Other Ambulatory Visit: Payer: Self-pay | Admitting: Family Medicine

## 2014-10-25 NOTE — Telephone Encounter (Signed)
Received refill request electronically from pharmacy.  Last refill 08/28/14 #40, last office visit 10/06/14 Is it okay to refill medication?

## 2014-10-26 NOTE — Telephone Encounter (Signed)
Sent. Thanks.   

## 2014-10-27 ENCOUNTER — Encounter: Payer: Self-pay | Admitting: Neurology

## 2014-10-27 ENCOUNTER — Ambulatory Visit (INDEPENDENT_AMBULATORY_CARE_PROVIDER_SITE_OTHER): Payer: PPO | Admitting: Neurology

## 2014-10-27 VITALS — BP 144/78 | HR 104 | Ht 63.0 in | Wt 178.0 lb

## 2014-10-27 DIAGNOSIS — R413 Other amnesia: Secondary | ICD-10-CM

## 2014-10-27 NOTE — Progress Notes (Signed)
PATIENT: Stacy Davis DOB: 1945-06-21  Chief Complaint  Patient presents with  . Memory Loss    Rm 4  New Patient - Vision L 20/40 Both eyes with glasses -MMSE -27/30 - Aft -17    HISTORICAL  Stacy Davis is a 69 years old right-handed female, accompanied by her husband, referred by her primary care physician Dr. Damita Dunnings for evaluation of memory loss.  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  I have personally reviewed CAT scan in 2013, mild atrophy, next  Laboratory evaluation in 2016 elevated triglyceride 480, cholesterol 294.    REVIEW OF SYSTEMS: Full 14 system review of systems performed and notable only for memory loss, eye pain, easy bleeding ALLERGIES: Allergies  Allergen Reactions  . Tramadol Other (See Comments)    CHANGES IN MEMORY    HOME MEDICATIONS: Current Outpatient Prescriptions  Medication Sig Dispense Refill  . cyclobenzaprine (FLEXERIL) 10 MG tablet TAKE 1/2 TO 1 TABLET BY MOUTH 3 TIMES A DAY AS NEEDED 40 tablet 0  . hydroxychloroquine (PLAQUENIL) 200 MG tablet Take 200 mg by mouth daily.    Marland Kitchen levothyroxine (SYNTHROID, LEVOTHROID) 88 MCG tablet TAKE 1 TABLET BY MOUTH ONCE A DAY 30 tablet 3  . lisinopril (ZESTRIL) 2.5 MG tablet  Take 1 tablet (2.5 mg total) by mouth daily. 90 tablet 3  . LORazepam (ATIVAN) 0.5 MG tablet Take 1 tablet (0.5 mg total) by mouth 3 (three) times daily as needed for anxiety. 30 tablet 0  . meloxicam (MOBIC) 15 MG tablet Take 1 tablet (15 mg total) by mouth daily.    . metFORMIN (GLUCOPHAGE-XR) 500 MG 24 hr tablet TAKE 1 TABLET BY MOUTH TWICE A DAY WITH MEALS 60 tablet 11  . metoprolol tartrate (LOPRESSOR) 25 MG tablet TAKE 1 TABLET BY MOUTH TWICE A DAY 60 tablet 5  . oxyCODONE-acetaminophen (PERCOCET/ROXICET) 5-325 MG per tablet TAKE 1/2 TO 1 TABLET BY MOUTH EVERY 8 HOURS AS NEEDED FOR PAIN. LIMITUSE AS MUCH AS POSSIBLE 30 tablet 0  . pantoprazole (PROTONIX) 40 MG tablet Take 40 mg by mouth daily.    Marland Kitchen zolpidem (AMBIEN) 10 MG tablet TAKE 1 TABLET BY MOUTH EVERY NIGHT AT BEDTIME (Patient taking differently: TAKE 1 TABLET BY MOUTH EVERY NIGHT AT BEDTIME prn) 30 tablet 2   No current facility-administered medications for this visit.    PAST MEDICAL HISTORY: Past Medical History  Diagnosis Date  . Back pain     h/o DDD since 2010  . Arthritis     ?OA vs Rheum (established with Dr. Jefm Bryant pending blood work)  . GERD (gastroesophageal reflux disease)   . Hyperlipidemia     no current med.  . Hypothyroid   . Von Willebrand disease   . Headache(784.0)     occasional  .  Memory impairment   . History of skin cancer   . Articular cartilage disorder of shoulder 04/2012    right  . Rotator cuff rupture 04/2012    right  . Impingement syndrome of right shoulder 04/2012  . Diabetes mellitus     NIDDM  . Anxiety   . Dental crowns present     also dental caps  . Right rotator cuff tear 05/15/2012  . Hypertension     PAST SURGICAL HISTORY: Past Surgical History  Procedure Laterality Date  . Cesarean section      x 2  . Tubal ligation    . Breast surgery      hematoma evacuation due to trauma  . Cardiac catheterization  03/06/2001  . Colonoscopy  01/20/2006 per patient  .  Appendectomy    . Orif wrist fracture      left  . Lumbar laminectomy/decompression microdiscectomy  03/01/2009    right L4-5; arthrodesis L4-5  . Shoulder arthroscopy with rotator cuff repair and subacromial decompression  05/15/2012    Procedure: SHOULDER ARTHROSCOPY WITH ROTATOR CUFF REPAIR AND SUBACROMIAL DECOMPRESSION;  Surgeon: Johnny Bridge, MD;  Location: [facility];  Service: Orthopedics;  Laterality: Right;  RIGHT ARTHROSCOPY SHOULDER DEBRIDEMENT LIMITED, ARTHROSCOPY SHOULDER DECOMPRESSION SUBACROMIAL PARTIAL ACROMIOPLASTY WITH CORACOACROMIAL RELEASE, ARTHROSCOPY SHOULDER WITH ROTATOR CUFF REPAIR    FAMILY HISTORY: Family History  Problem Relation Age of Onset  . Dementia Mother   . Cancer Father     unknown primary  . Heart disease Father   . Thyroid disease Sister   . Cancer Sister     brain tumor  . Dementia Sister   . COPD Brother   . Cancer Brother     bone cancer  . Colon cancer Paternal Grandfather   . Breast cancer Neg Hx     SOCIAL HISTORY:  History   Social History  . Marital Status: Married    Spouse Name: Trilby Drummer  . Number of Children: 2  . Years of Education: 12   Occupational History  .      retired   Social History Main Topics  . Smoking status: Never Smoker   . Smokeless tobacco: Never Used  . Alcohol Use: No  . Drug Use: No  . Sexual Activity: Not on file   Other Topics Concern  . Not on file   Social History Narrative   Patient lives at home with her husband Trilby Drummer).   Patient is retired .   Education high school.   Right handed.   Caffeine sweet tea two cups.           PHYSICAL EXAM   Filed Vitals:   10/27/14 1304  BP: 144/78  Pulse: 104  Height: 5\' 3"  (1.6 m)  Weight: 178 lb (80.74 kg)    Not recorded      Body mass index is 31.54 kg/(m^2).  PHYSICAL EXAMNIATION:  Gen: NAD, conversant, well nourised, obese, well groomed                     Cardiovascular: Regular rate rhythm, no peripheral  edema, warm, nontender. Eyes: Conjunctivae clear without exudates or hemorrhage Neck: Supple, no carotid bruise. Pulmonary: Clear to auscultation bilaterally   NEUROLOGICAL EXAM:  MENTAL STATUS: Speech:    Speech is normal; fluent and spontaneous with normal comprehension.  Cognition:   Mini-Mental Status Examination 27 out of 30, she is not oriented to date, year, day, animal naming 17.  CRANIAL NERVES:  CN II: Visual fields are full to confrontation. Fundoscopic exam is normal with sharp discs and no vascular changes. Venous pulsations are present bilaterally. Pupils are 4 mm and briskly reactive to light. Visual acuity is 20/20 bilaterally. CN III, IV, VI: extraocular movement are normal. No ptosis. CN V: Facial sensation is intact to pinprick in all 3 divisions bilaterally. Corneal responses are intact.  CN VII: Face is symmetric with normal eye closure and smile. CN VIII: Hearing is normal to rubbing fingers CN IX, X: Palate elevates symmetrically. Phonation is normal. CN XI: Head turning and shoulder shrug are intact CN XII: Tongue is midline with normal movements and no atrophy.  MOTOR: There is no pronator drift of out-stretched arms. Muscle bulk and tone are normal. Muscle strength is normal.  REFLEXES: Reflexes are 2+ and symmetric at the biceps, triceps, knees, and ankles. Plantar responses are flexor.  SENSORY: Light touch, pinprick, position sense, and vibration sense are intact in fingers and toes.  COORDINATION: Rapid alternating movements and fine finger movements are intact. There is no dysmetria on finger-to-nose and heel-knee-shin. There are no abnormal or extraneous movements.   GAIT/STANCE: Posture is normal. Gait is steady with normal steps, base, arm swing, and turning. Heel and toe walking are normal. Tandem gait is normal.  Romberg is absent.   DIAGNOSTIC DATA (LABS, IMAGING, TESTING) - I reviewed patient records, labs, notes, testing and imaging  myself where available.  Lab Results  Component Value Date   WBC 5.0 06/10/2012   HGB 11.6* 06/10/2012   HCT 34.7* 06/10/2012   MCV 83.1 06/10/2012   PLT 245.0 06/10/2012      Component Value Date/Time   NA 140 07/08/2014 1025   K 4.7 07/08/2014 1025   CL 104 07/08/2014 1025   CO2 29 07/08/2014 1025   GLUCOSE 101* 07/08/2014 1025   BUN 20 07/08/2014 1025   CREATININE 1.04 07/08/2014 1025   CALCIUM 10.2 07/08/2014 1025   PROT 7.7 07/08/2014 1025   ALBUMIN 4.4 07/08/2014 1025   AST 25 07/08/2014 1025   ALT 16 07/08/2014 1025   ALKPHOS 70 07/08/2014 1025   BILITOT 0.4 07/08/2014 1025   GFRNONAA 87* 05/13/2012 1350   GFRAA >90 05/13/2012 1350   Lab Results  Component Value Date   CHOL 294* 07/08/2014   HDL 45.60 07/08/2014   LDLDIRECT 164.0 07/08/2014   TRIG * 07/08/2014    480.0 Triglyceride is over 400; calculations on Lipids are invalid.   CHOLHDL 6 07/08/2014   Lab Results  Component Value Date   HGBA1C 6.3 07/08/2014   Lab Results  Component Value Date   XTGGYIRS85 462 07/23/2011   Lab Results  Component Value Date   TSH 3.82 09/08/2014      ASSESSMENT AND PLAN  Stacy Davis is a 69 y.o. female  with gradual onset memory trouble, strong family history of Alzheimer's disease, today's Mini-Mental Status Examination is 27 out of 30, previous CAT scan of the brain without contrast showed mild frontal atrophy  1. Mild cognitive impairment, complete evaluation with MRI of the brain without contrast 2. Laboratory evaluations, B12, 3. Return to clinic in 1 month, may consider Aricept, Namenda treatment.     Marcial Pacas, M.D. Ph.D.  [facility] [address], [place], [place] [address] Ph: [phone] Fax: [phone]

## 2014-10-28 LAB — ANA W/REFLEX IF POSITIVE: Anti Nuclear Antibody(ANA): NEGATIVE

## 2014-10-28 LAB — FOLATE: Folate: 15.9 ng/mL (ref >3.0)

## 2014-10-28 LAB — SEDIMENTATION RATE: Sed Rate: 7 mm/hr (ref 0–40)

## 2014-10-28 LAB — RPR: RPR Ser Ql: NONREACTIVE

## 2014-10-28 LAB — VITAMIN B12: Vitamin B-12: 301 pg/mL (ref 211–946)

## 2014-10-28 LAB — C-REACTIVE PROTEIN: CRP: 1.8 mg/L (ref 0.0–4.9)

## 2014-11-01 ENCOUNTER — Telehealth: Payer: Self-pay | Admitting: *Deleted

## 2014-11-01 NOTE — Telephone Encounter (Signed)
-----   Message from Marcial Pacas, MD sent at 11/01/2014  7:41 AM EDT ----- Please call patient for normal laboratory result

## 2014-11-01 NOTE — Telephone Encounter (Signed)
Patient notified of results.

## 2014-11-02 ENCOUNTER — Other Ambulatory Visit: Payer: Self-pay | Admitting: Rheumatology

## 2014-11-02 DIAGNOSIS — M25561 Pain in right knee: Secondary | ICD-10-CM

## 2014-11-08 ENCOUNTER — Ambulatory Visit
Admission: RE | Admit: 2014-11-08 | Discharge: 2014-11-08 | Disposition: A | Discharge status: Home / Self Care | Payer: PPO | Source: Ambulatory Visit | Attending: Neurology | Admitting: Neurology

## 2014-11-08 DIAGNOSIS — R413 Other amnesia: Secondary | ICD-10-CM | POA: Diagnosis not present

## 2014-11-09 ENCOUNTER — Telehealth: Payer: Self-pay | Admitting: Neurology

## 2014-11-09 ENCOUNTER — Ambulatory Visit
Admission: RE | Admit: 2014-11-09 | Discharge: 2014-11-09 | Disposition: A | Discharge status: Home / Self Care | Payer: PPO | Source: Ambulatory Visit | Attending: Rheumatology | Admitting: Rheumatology

## 2014-11-09 DIAGNOSIS — M25561 Pain in right knee: Secondary | ICD-10-CM | POA: Insufficient documentation

## 2014-11-09 NOTE — Telephone Encounter (Signed)
Please call patient, MRI of the brain showed age related changes, no acute lesions, will go over detail at her next follow-up visit  Abnormal MRI brain (without) demonstrating: 1. Few scattered periventricular and subcortical foci of chronic small vessel ischemic disease. 2. No acute findings.

## 2014-11-09 NOTE — Telephone Encounter (Signed)
Spoke to [place] - she is aware of results and will keep follow up to further discuss.

## 2014-11-22 ENCOUNTER — Other Ambulatory Visit: Payer: Self-pay | Admitting: Family Medicine

## 2014-11-22 NOTE — Telephone Encounter (Signed)
Electronic refill request. Last Filled:    30 tablet 2 RF on 04/27/2014  Please advise.

## 2014-11-23 NOTE — Telephone Encounter (Signed)
Rx called to pharmacy

## 2014-11-23 NOTE — Telephone Encounter (Signed)
Please call in

## 2014-11-25 ENCOUNTER — Other Ambulatory Visit: Payer: PPO

## 2014-11-29 ENCOUNTER — Encounter: Payer: Self-pay | Admitting: *Deleted

## 2014-11-29 DIAGNOSIS — E119 Type 2 diabetes mellitus without complications: Secondary | ICD-10-CM | POA: Diagnosis not present

## 2014-11-29 DIAGNOSIS — M6751 Plica syndrome, right knee: Secondary | ICD-10-CM | POA: Diagnosis not present

## 2014-11-29 DIAGNOSIS — I10 Essential (primary) hypertension: Secondary | ICD-10-CM | POA: Diagnosis not present

## 2014-11-29 DIAGNOSIS — Z8261 Family history of arthritis: Secondary | ICD-10-CM | POA: Diagnosis not present

## 2014-11-29 DIAGNOSIS — M1990 Unspecified osteoarthritis, unspecified site: Secondary | ICD-10-CM | POA: Diagnosis not present

## 2014-11-29 DIAGNOSIS — D680 Von Willebrand's disease: Secondary | ICD-10-CM | POA: Diagnosis not present

## 2014-11-29 DIAGNOSIS — R251 Tremor, unspecified: Secondary | ICD-10-CM | POA: Diagnosis not present

## 2014-11-29 DIAGNOSIS — Z8249 Family history of ischemic heart disease and other diseases of the circulatory system: Secondary | ICD-10-CM | POA: Diagnosis not present

## 2014-11-29 DIAGNOSIS — M23221 Derangement of posterior horn of medial meniscus due to old tear or injury, right knee: Secondary | ICD-10-CM | POA: Diagnosis present

## 2014-11-29 DIAGNOSIS — G4733 Obstructive sleep apnea (adult) (pediatric): Secondary | ICD-10-CM | POA: Diagnosis not present

## 2014-11-29 DIAGNOSIS — R413 Other amnesia: Secondary | ICD-10-CM | POA: Diagnosis not present

## 2014-11-29 DIAGNOSIS — Z79899 Other long term (current) drug therapy: Secondary | ICD-10-CM | POA: Diagnosis not present

## 2014-11-29 DIAGNOSIS — M23231 Derangement of other medial meniscus due to old tear or injury, right knee: Secondary | ICD-10-CM | POA: Diagnosis not present

## 2014-11-29 DIAGNOSIS — E079 Disorder of thyroid, unspecified: Secondary | ICD-10-CM | POA: Diagnosis not present

## 2014-11-29 DIAGNOSIS — F419 Anxiety disorder, unspecified: Secondary | ICD-10-CM | POA: Diagnosis not present

## 2014-11-29 DIAGNOSIS — Z809 Family history of malignant neoplasm, unspecified: Secondary | ICD-10-CM | POA: Diagnosis not present

## 2014-11-29 NOTE — Patient Instructions (Signed)
  Your procedure is scheduled on: 12-01-14 Report to [facility] To find out your arrival time please call [phone] between 1PM - 3PM on 11-30-14 [facility])  Remember: Instructions that are not followed completely may result in serious medical risk, up to and including death, or upon the discretion of your surgeon and anesthesiologist your surgery may need to be rescheduled.    _X___ 1. Do not eat food or drink liquids after midnight. No gum chewing or hard candies.     _X___ 2. No Alcohol for 24 hours before or after surgery.   ____ 3. Bring all medications with you on the day of surgery if instructed.    _X___ 4. Notify your doctor if there is any change in your medical condition     (cold, fever, infections).     Do not wear jewelry, make-up, hairpins, clips or nail polish.  Do not wear lotions, powders, or perfumes. You may wear deodorant.  Do not shave 48 hours prior to surgery. Men may shave face and neck.  Do not bring valuables to the hospital.    [facility] is not responsible for any belongings or valuables.               Contacts, dentures or bridgework may not be worn into surgery.  Leave your suitcase in the car. After surgery it may be brought to your room.  For patients admitted to the hospital, discharge time is determined by your treatment team.   Patients discharged the day of surgery will not be allowed to drive home.   Please read over the following fact sheets that you were given:     _X___ Take these medicines the morning of surgery with A SIP OF WATER:    1. PANTOPRAZOLE  2. LEVOTHYROXINE  3. METOPROLOL  4. TAKE AN EXTRA DOSE OF PANTOPRAZOLE Wednesday NIGHT  5.  6.  ____ Fleet Enema (as directed)   _X___ Use CHG Soap as directed  ____ Use inhalers on the day of surgery  _X___ Stop metformin 2 days prior to surgery-NOW    ____ Take 1/2 of usual insulin dose the night before surgery and none on the morning of surgery.    ____ Stop Coumadin/Plavix/aspirin-N/A  _X___ Stop Anti-inflammatories-STOP MELOXICAM NOW-NO NSAIDS OR ASPIRIN PRODUCTS-ACETAMINOPHEN (TYLENOL) OK TO TAKE   ____ Stop supplements until after surgery.    ____ Bring C-Pap to the hospital.

## 2014-11-30 ENCOUNTER — Encounter
Admission: RE | Admit: 2014-11-30 | Discharge: 2014-11-30 | Disposition: A | Discharge status: Home / Self Care | Payer: PPO | Source: Ambulatory Visit | Attending: Orthopedic Surgery | Admitting: Orthopedic Surgery

## 2014-11-30 DIAGNOSIS — M23231 Derangement of other medial meniscus due to old tear or injury, right knee: Secondary | ICD-10-CM | POA: Diagnosis not present

## 2014-11-30 LAB — BASIC METABOLIC PANEL
Anion gap: 10 (ref 5–15)
BUN: 13 mg/dL (ref 6–20)
CO2: 25 mmol/L (ref 22–32)
Calcium: 9.4 mg/dL (ref 8.9–10.3)
Chloride: 104 mmol/L (ref 101–111)
Creatinine, Ser: 0.92 mg/dL (ref 0.44–1.00)
GFR calc Af Amer: >60 mL/min (ref >60)
GFR calc non Af Amer: >60 mL/min (ref >60)
Glucose, Bld: 92 mg/dL (ref 65–99)
Potassium: 3.5 mmol/L (ref 3.5–5.1)
Sodium: 139 mmol/L (ref 135–145)

## 2014-11-30 LAB — DIFFERENTIAL
Basophils Absolute: 0.2 10*3/uL — ABNORMAL HIGH (ref 0–0.1)
Basophils Relative: 3 %
Eosinophils Absolute: 0.1 10*3/uL (ref 0–0.7)
Eosinophils Relative: 2 %
Lymphocytes Relative: 18 %
Lymphs Abs: 1.2 10*3/uL (ref 1.0–3.6)
Monocytes Absolute: 0.5 10*3/uL (ref 0.2–0.9)
Monocytes Relative: 7 %
Neutro Abs: 4.7 10*3/uL (ref 1.4–6.5)
Neutrophils Relative %: 70 %

## 2014-11-30 LAB — CBC
HCT: 37.1 % (ref 35.0–47.0)
Hemoglobin: 12 g/dL (ref 12.0–16.0)
MCH: 26.8 pg (ref 26.0–34.0)
MCHC: 32.3 g/dL (ref 32.0–36.0)
MCV: 82.9 fL (ref 80.0–100.0)
Platelets: 204 10*3/uL (ref 150–440)
RBC: 4.47 MIL/uL (ref 3.80–5.20)
RDW: 15.6 % — ABNORMAL HIGH (ref 11.5–14.5)
WBC: 6.7 10*3/uL (ref 3.6–11.0)

## 2014-12-01 ENCOUNTER — Ambulatory Visit: Payer: PPO | Admitting: Anesthesiology

## 2014-12-01 ENCOUNTER — Encounter: Payer: Self-pay | Admitting: *Deleted

## 2014-12-01 ENCOUNTER — Encounter
Admission: RE | Disposition: A | Discharge status: Home / Self Care | Payer: Self-pay | Source: Ambulatory Visit | Attending: Orthopedic Surgery

## 2014-12-01 ENCOUNTER — Ambulatory Visit
Admission: RE | Admit: 2014-12-01 | Discharge: 2014-12-01 | Disposition: A | Discharge status: Home / Self Care | Payer: PPO | Source: Ambulatory Visit | Attending: Orthopedic Surgery | Admitting: Orthopedic Surgery

## 2014-12-01 DIAGNOSIS — M6751 Plica syndrome, right knee: Secondary | ICD-10-CM | POA: Insufficient documentation

## 2014-12-01 DIAGNOSIS — I10 Essential (primary) hypertension: Secondary | ICD-10-CM | POA: Insufficient documentation

## 2014-12-01 DIAGNOSIS — R251 Tremor, unspecified: Secondary | ICD-10-CM | POA: Insufficient documentation

## 2014-12-01 DIAGNOSIS — E079 Disorder of thyroid, unspecified: Secondary | ICD-10-CM | POA: Insufficient documentation

## 2014-12-01 DIAGNOSIS — M23231 Derangement of other medial meniscus due to old tear or injury, right knee: Secondary | ICD-10-CM | POA: Insufficient documentation

## 2014-12-01 DIAGNOSIS — Z809 Family history of malignant neoplasm, unspecified: Secondary | ICD-10-CM | POA: Insufficient documentation

## 2014-12-01 DIAGNOSIS — Z8249 Family history of ischemic heart disease and other diseases of the circulatory system: Secondary | ICD-10-CM | POA: Insufficient documentation

## 2014-12-01 DIAGNOSIS — Z79899 Other long term (current) drug therapy: Secondary | ICD-10-CM | POA: Insufficient documentation

## 2014-12-01 DIAGNOSIS — G4733 Obstructive sleep apnea (adult) (pediatric): Secondary | ICD-10-CM | POA: Insufficient documentation

## 2014-12-01 DIAGNOSIS — Z8261 Family history of arthritis: Secondary | ICD-10-CM | POA: Insufficient documentation

## 2014-12-01 DIAGNOSIS — M1990 Unspecified osteoarthritis, unspecified site: Secondary | ICD-10-CM | POA: Insufficient documentation

## 2014-12-01 DIAGNOSIS — R413 Other amnesia: Secondary | ICD-10-CM | POA: Insufficient documentation

## 2014-12-01 DIAGNOSIS — F419 Anxiety disorder, unspecified: Secondary | ICD-10-CM | POA: Insufficient documentation

## 2014-12-01 DIAGNOSIS — E119 Type 2 diabetes mellitus without complications: Secondary | ICD-10-CM | POA: Insufficient documentation

## 2014-12-01 DIAGNOSIS — D680 Von Willebrand's disease: Secondary | ICD-10-CM | POA: Insufficient documentation

## 2014-12-01 HISTORY — PX: KNEE ARTHROSCOPY: SHX127

## 2014-12-01 HISTORY — DX: Thyrotoxicosis, unspecified without thyrotoxic crisis or storm: E05.90

## 2014-12-01 LAB — GLUCOSE, CAPILLARY: Glucose-Capillary: 94 mg/dL (ref 65–99)

## 2014-12-01 SURGERY — ARTHROSCOPY, KNEE
Anesthesia: General | Site: Knee | Laterality: Right | Wound class: Clean

## 2014-12-01 MED ORDER — FENTANYL CITRATE (PF) 100 MCG/2ML IJ SOLN
25.0000 ug | INTRAMUSCULAR | Status: DC | PRN
  Administered 2014-12-01 (×4): 25 ug via INTRAVENOUS

## 2014-12-01 MED ORDER — SODIUM CHLORIDE 0.9 % IV SOLN
INTRAVENOUS | Status: DC
  Administered 2014-12-01 (×2): via INTRAVENOUS

## 2014-12-01 MED ORDER — ONDANSETRON HCL 4 MG PO TABS: 4.0000 mg | ORAL_TABLET | Freq: Four times a day (QID) | ORAL | Status: DC | PRN

## 2014-12-01 MED ORDER — ONDANSETRON HCL 4 MG/2ML IJ SOLN: 4.0000 mg | Freq: Once | INTRAMUSCULAR | Status: DC | PRN

## 2014-12-01 MED ORDER — PROPOFOL 10 MG/ML IV BOLUS
INTRAVENOUS | Status: DC | PRN
  Administered 2014-12-01: 150 mg via INTRAVENOUS

## 2014-12-01 MED ORDER — MIDAZOLAM HCL 2 MG/2ML IJ SOLN
INTRAMUSCULAR | Status: DC | PRN
  Administered 2014-12-01: 2 mg via INTRAVENOUS

## 2014-12-01 MED ORDER — HYDROCODONE-ACETAMINOPHEN 5-325 MG PO TABS: 1.0000 | ORAL_TABLET | ORAL | Status: DC | PRN

## 2014-12-01 MED ORDER — FENTANYL CITRATE (PF) 100 MCG/2ML IJ SOLN
INTRAMUSCULAR | Status: DC | PRN
  Administered 2014-12-01: 100 ug via INTRAVENOUS

## 2014-12-01 MED ORDER — SODIUM CHLORIDE 0.9 % IV SOLN: INTRAVENOUS | Status: DC

## 2014-12-01 MED ORDER — FENTANYL CITRATE (PF) 100 MCG/2ML IJ SOLN
INTRAMUSCULAR | Status: AC
  Administered 2014-12-01: 25 ug via INTRAVENOUS
  Filled 2014-12-01: qty 2

## 2014-12-01 MED ORDER — METOCLOPRAMIDE HCL 10 MG PO TABS: 5.0000 mg | ORAL_TABLET | Freq: Three times a day (TID) | ORAL | Status: DC | PRN

## 2014-12-01 MED ORDER — ONDANSETRON HCL 4 MG/2ML IJ SOLN: 4.0000 mg | Freq: Four times a day (QID) | INTRAMUSCULAR | Status: DC | PRN

## 2014-12-01 MED ORDER — BUPIVACAINE-EPINEPHRINE (PF) 0.5% -1:200000 IJ SOLN
INTRAMUSCULAR | Status: AC
  Filled 2014-12-01: qty 30

## 2014-12-01 MED ORDER — HYDROCODONE-ACETAMINOPHEN 5-325 MG PO TABS: 1.0000 | ORAL_TABLET | Freq: Four times a day (QID) | ORAL | Status: DC | PRN

## 2014-12-01 MED ORDER — METOCLOPRAMIDE HCL 5 MG/ML IJ SOLN: 5.0000 mg | Freq: Three times a day (TID) | INTRAMUSCULAR | Status: DC | PRN

## 2014-12-01 MED ORDER — BUPIVACAINE-EPINEPHRINE (PF) 0.5% -1:200000 IJ SOLN
INTRAMUSCULAR | Status: DC | PRN
  Administered 2014-12-01: 20 mL via PERINEURAL

## 2014-12-01 SURGICAL SUPPLY — 29 items
BANDAGE ELASTIC 4 CLIP NS LF (GAUZE/BANDAGES/DRESSINGS) ×3 IMPLANT
BANDAGE ELASTIC 4 CLIP ST LF (GAUZE/BANDAGES/DRESSINGS) ×3 IMPLANT
BLADE FULL RADIUS 3.5 (BLADE) ×1 IMPLANT
BLADE INCISOR PLUS 4.5 (BLADE) ×3 IMPLANT
BLADE SHAVER 4.5 DBL SERAT CV (CUTTER) ×1 IMPLANT
BLADE SHAVER 4.5X7 STR FR (MISCELLANEOUS) ×1 IMPLANT
CHLORAPREP W/TINT 26ML (MISCELLANEOUS) ×3 IMPLANT
CUTTER AGGRESSIVE+ 3.5 (CUTTER) ×1 IMPLANT
DECANTER SPIKE VIAL GLASS SM (MISCELLANEOUS) ×2 IMPLANT
GAUZE PETRO XEROFOAM 1X8 (MISCELLANEOUS) ×3 IMPLANT
GAUZE SPONGE 4X4 12PLY STRL (GAUZE/BANDAGES/DRESSINGS) ×3 IMPLANT
GLOVE BIOGEL PI IND STRL 9 (GLOVE) ×1 IMPLANT
GLOVE BIOGEL PI INDICATOR 9 (GLOVE) ×2
GLOVE SURG ORTHO 9.0 STRL STRW (GLOVE) ×3 IMPLANT
GOWN SPECIALTY ULTRA XL (MISCELLANEOUS) ×3 IMPLANT
GOWN STRL REUS W/ TWL LRG LVL3 (GOWN DISPOSABLE) ×1 IMPLANT
GOWN STRL REUS W/TWL LRG LVL3 (GOWN DISPOSABLE) ×3
IV LACTATED RINGER IRRG 3000ML (IV SOLUTION) ×6
IV LR IRRIG 3000ML ARTHROMATIC (IV SOLUTION) ×4 IMPLANT
KIT RM TURNOVER STRD PROC AR (KITS) ×3 IMPLANT
MANIFOLD NEPTUNE II (INSTRUMENTS) ×3 IMPLANT
PACK ARTHROSCOPY KNEE (MISCELLANEOUS) ×3 IMPLANT
SET TUBE SUCT SHAVER OUTFL 24K (TUBING) ×3 IMPLANT
SET TUBE TIP INTRA-ARTICULAR (MISCELLANEOUS) ×3 IMPLANT
SUT ETHILON 4-0 (SUTURE) ×3
SUT ETHILON 4-0 FS2 18XMFL BLK (SUTURE) ×1
SUTURE ETHLN 4-0 FS2 18XMF BLK (SUTURE) ×1 IMPLANT
TUBING ARTHRO INFLOW-ONLY STRL (TUBING) ×3 IMPLANT
WAND HAND CNTRL MULTIVAC 50 (MISCELLANEOUS) ×3 IMPLANT

## 2014-12-01 NOTE — Anesthesia Postprocedure Evaluation (Signed)
  Anesthesia Post-op Note  Patient: Stacy Davis  Procedure(s) Performed: Procedure(s): ARTHROSCOPY KNEE,partial medial menisectomy and plica excision (Right)  Anesthesia type:General  Patient location: PACU  Post pain: Pain level controlled  Post assessment: Post-op Vital signs reviewed, Patient's Cardiovascular Status Stable, Respiratory Function Stable, Patent Airway and No signs of Nausea or vomiting  Post vital signs: Reviewed and stable  Last Vitals:  Filed Vitals:   12/01/14 1522  BP: 133/66  Pulse: 61  Temp: 35.8 C  Resp: 16    Level of consciousness: awake, alert  and patient cooperative  Complications: No apparent anesthesia complications

## 2014-12-01 NOTE — Progress Notes (Signed)
Pt awakened, oral airway removed intact 

## 2014-12-01 NOTE — Progress Notes (Signed)
Ice pack to right knee, elevated on pillow.

## 2014-12-01 NOTE — H&P (Signed)
Reviewed paper H+P, will be scanned into chart. No changes noted.  

## 2014-12-01 NOTE — Transfer of Care (Signed)
Immediate Anesthesia Transfer of Care Note  Patient: Stacy Davis  Procedure(s) Performed: Procedure(s): ARTHROSCOPY KNEE,partial medial menisectomy and plica excision (Right)  Patient Location: PACU  Anesthesia Type:General  Level of Consciousness: sedated  Airway & Oxygen Therapy: Patient Spontanous Breathing and Patient connected to face mask oxygen  Post-op Assessment: Report given to RN and Post -op Vital signs reviewed and stable  Post vital signs: Reviewed and stable  Last Vitals:  Filed Vitals:   12/01/14 1133  BP: 140/79  Pulse: 65  Temp: 36.9 C  Resp: 16    Complications: No apparent anesthesia complications

## 2014-12-01 NOTE — Anesthesia Preprocedure Evaluation (Signed)
Anesthesia Evaluation  Patient identified by MRN, date of birth, ID band Patient awake    Reviewed: Allergy & Precautions, NPO status , Patient's Chart, lab work & pertinent test results, reviewed documented beta blocker date and time   Airway Mallampati: II  TM Distance: >3 FB     Dental  (+) Chipped   Pulmonary          Cardiovascular hypertension, Pt. on medications     Neuro/Psych  Headaches, PSYCHIATRIC DISORDERS Anxiety    GI/Hepatic GERD-  ,  Endo/Other  diabetesHypothyroidism   Renal/GU Renal InsufficiencyRenal disease     Musculoskeletal  (+) Arthritis -,   Abdominal   Peds  Hematology   Anesthesia Other Findings Von willebrands but pt has no bleeding problems. Has memory loss. Will give more propofol.  Reproductive/Obstetrics                             Anesthesia Physical Anesthesia Plan  ASA: III  Anesthesia Plan: General   Post-op Pain Management:    Induction: Intravenous  Airway Management Planned: LMA  Additional Equipment:   Intra-op Plan:   Post-operative Plan:   Informed Consent: I have reviewed the patients History and Physical, chart, labs and discussed the procedure including the risks, benefits and alternatives for the proposed anesthesia with the patient or authorized representative who has indicated his/her understanding and acceptance.     Plan Discussed with: CRNA  Anesthesia Plan Comments:         Anesthesia Quick Evaluation

## 2014-12-01 NOTE — Discharge Instructions (Signed)
Elevate leg on at least 2 pillows Use ice pack as much as possible Partial weight bearing only. Keep dressing clean and dry.    AMBULATORY SURGERY  DISCHARGE INSTRUCTIONS   1) The drugs that you were given will stay in your system until tomorrow so for the next 24 hours you should not:  A) Drive an automobile B) Make any legal decisions C) Drink any alcoholic beverage   2) You may resume regular meals tomorrow.  Today it is better to start with liquids and gradually work up to solid foods.  You may eat anything you prefer, but it is better to start with liquids, then soup and crackers, and gradually work up to solid foods.   3) Please notify your doctor immediately if you have any unusual bleeding, trouble breathing, redness and pain at the surgery site, drainage, fever, or pain not relieved by medication.  4) Your post-operative visit with Dr.                                     is: Date:                        Time:  [id number]  2) Additional Instructions

## 2014-12-01 NOTE — Op Note (Addendum)
12/01/2014  2:01 PM  PATIENT:  Stacy Davis  69 y.o. female  PRE-OPERATIVE DIAGNOSIS:  COMPLEX TEAR MEDIAL MENISCUS RIGHT  POST-OPERATIVE DIAGNOSIS:  Complex tear medial meniscus right and medial plica band  PROCEDURE:  Procedure(s): ARTHROSCOPY KNEE,partial medial menisectomy and plica excision (Right)  SURGEON: Laurene Footman, MD  ASSISTANTS: None  ANESTHESIA:   general  EBL:  Total I/O In: 500 [I.V.:500] Out: 10 [Blood:10]  BLOOD ADMINISTERED:none  DRAINS: none   LOCAL MEDICATIONS USED:  MARCAINE     SPECIMEN:  No Specimen  DISPOSITION OF SPECIMEN:  N/A  COUNTS:  YES  TOURNIQUET:  None   IMPLANTS:  None  DICTATION: .Dragon Dictation patient brought the operating room and after adequate general anesthesia was obtained, the right leg was prepped and draped in sterile fashion with a ligament or Leg holder with tourniquet applied but not required. After patient education and timeout procedure completed an inferior lateral portal was made and the scope was introduced inspection revealed a medial plica quite thick and appeared to impinge on the medial femoral condyle there is minimal to mild patellofemoral degenerative change with normal tracking. Corral medial compartment inferior medial portal was made and there was significant chondromalacia grade 3 changes over most the weightbearing surface of the femur. No exposed bone there was a tear of the posterior horn with a profoundly radial tear. This was debrided with use of a meniscal punch and shaver and ArthriCare wand. The anterior cruciate ligament was intact. Lateral compartment showed multiple loose fragments floating around there were small cartilage fragments really large enough to be called loose bodies but did appear to be large enough that they could be symptomatic. The shaver was inserted on the side to remove these loose fragments of articular cartilage. After addressing the medial meniscus tear and these loose  cartilage fragments, the shaver was used to debride in the medial plica after this had been excised the wound was irrigated until clear argentation was withdrawn. Wounds closed with simple interrupted 4-0 nylon skin closure and 20 cc of Sensorcaine with epinephrine was infiltrated in the area around the incision for postop analgesia. Wound was covered with Xeroform 4 x 4's web roll and Ace wrap      CARE: Discharge to home after PACU  PATIENT DISPOSITION:  PACU - hemodynamically stable.

## 2014-12-02 ENCOUNTER — Encounter: Payer: Self-pay | Admitting: Orthopedic Surgery

## 2014-12-05 ENCOUNTER — Encounter: Payer: Self-pay | Admitting: Neurology

## 2014-12-05 ENCOUNTER — Ambulatory Visit (INDEPENDENT_AMBULATORY_CARE_PROVIDER_SITE_OTHER): Payer: PPO | Admitting: Neurology

## 2014-12-05 VITALS — BP 132/80 | HR 76 | Ht 66.0 in | Wt 181.0 lb

## 2014-12-05 DIAGNOSIS — F0390 Unspecified dementia without behavioral disturbance: Secondary | ICD-10-CM

## 2014-12-05 MED ORDER — DONEPEZIL HCL 10 MG PO TABS: 10.0000 mg | ORAL_TABLET | Freq: Every day | ORAL | Status: DC

## 2014-12-05 NOTE — Progress Notes (Signed)
Chief Complaint  Patient presents with  . Memory Loss    MMSE - 13 animals.  Stacy Davis is here with her huband, Trilby Drummer, to discuss lab and MRI results.      PATIENT: Stacy Davis DOB: 03/26/1946  Chief Complaint  Patient presents with  . Memory Loss    MMSE - 13 animals.  Stacy Davis is here with her huband, Trilby Drummer, to discuss lab and MRI results.    HISTORICAL  Stacy Davis is a 69 years old right-handed female, accompanied by her husband, referred by her primary care physician Dr. Damita Dunnings for evaluation of memory loss.  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  I have personally reviewed CAT scan in 2013, mild atrophy  Laboratory evaluation in 2016 elevated triglyceride 480, cholesterol 294.   UPDATE December 05 2014: She is with her husband at today's clinical visit, continue complains of slow decline memory trouble, to the point of affecting her daily activity, she has difficulty operating remote control, forget a conversation, has difficulty finding her car in the parking lot, she is also very anxious about her symptoms with a strong family history of Alzheimer's disease.  We have reviewed MRI of the brain, mild atrophy, subcortical gliosis, today's Mini-Mental  Status Examination is 24 out of 30   I have reviewed laboratory in 2016, normal TSH, B12, negative RPR  REVIEW OF SYSTEMS: Full 14 system review of systems performed and notable only for memory loss  ALLERGIES: Allergies  Allergen Reactions  . Tramadol Other (See Comments)    CHANGES IN MEMORY    HOME MEDICATIONS: Current Outpatient Prescriptions  Medication Sig Dispense Refill  . cyclobenzaprine (FLEXERIL) 10 MG tablet TAKE 1/2 TO 1 TABLET BY MOUTH 3 TIMES A DAY AS NEEDED 40 tablet 0  . hydroxychloroquine (PLAQUENIL) 200 MG tablet Take 200 mg by mouth daily.    Marland Kitchen levothyroxine (SYNTHROID, LEVOTHROID) 88 MCG tablet TAKE 1 TABLET BY MOUTH ONCE A DAY 30 tablet 3  . lisinopril (ZESTRIL) 2.5 MG tablet Take 1 tablet (2.5 mg total) by mouth daily. 90 tablet 3  . LORazepam (ATIVAN) 0.5 MG tablet Take 1 tablet (0.5 mg total) by mouth 3 (three) times daily as needed for anxiety. 30 tablet 0  . meloxicam (MOBIC) 15 MG tablet Take 1 tablet (15 mg total) by mouth daily.    . metFORMIN (GLUCOPHAGE-XR) 500 MG 24 hr tablet TAKE 1 TABLET BY MOUTH TWICE A DAY WITH MEALS 60 tablet 11  . metoprolol tartrate (LOPRESSOR) 25 MG tablet TAKE 1 TABLET BY MOUTH TWICE A DAY 60 tablet 5  . oxyCODONE-acetaminophen (PERCOCET/ROXICET) 5-325 MG per tablet TAKE 1/2 TO 1 TABLET BY MOUTH EVERY 8 HOURS AS NEEDED FOR PAIN. LIMITUSE AS MUCH AS POSSIBLE 30 tablet 0  . pantoprazole (PROTONIX) 40 MG tablet Take  40 mg by mouth daily.    Marland Kitchen zolpidem (AMBIEN) 10 MG tablet TAKE 1 TABLET BY MOUTH EVERY NIGHT AT BEDTIME (Patient taking differently: TAKE 1 TABLET BY MOUTH EVERY NIGHT AT BEDTIME prn) 30 tablet 2   No current facility-administered medications for this visit.    PAST MEDICAL HISTORY: Past Medical History  Diagnosis Date  . Back pain     h/o DDD since 2010  . Arthritis     ?OA vs Rheum (established with Dr. Jefm Bryant pending blood work)  . GERD (gastroesophageal reflux disease)   . Hyperlipidemia     no current  med.  . Hypothyroid   . Von Willebrand disease   . Headache(784.0)     occasional  . Memory impairment   . History of skin cancer   . Articular cartilage disorder of shoulder 04/2012    right  . Rotator cuff rupture 04/2012    right  . Impingement syndrome of right shoulder 04/2012  . Diabetes mellitus     NIDDM  . Anxiety   . Dental crowns present     also dental caps  . Right rotator cuff tear 05/15/2012  . Hypertension   . Hyperthyroidism     PAST SURGICAL HISTORY: Past Surgical History  Procedure Laterality Date  . Cesarean section      x 2  . Tubal ligation    . Breast surgery      hematoma evacuation due to trauma  . Cardiac catheterization  03/06/2001  . Colonoscopy  01/20/2006 per patient  . Appendectomy    . Orif wrist fracture      left  . Lumbar laminectomy/decompression microdiscectomy  03/01/2009    right L4-5; arthrodesis L4-5  . Shoulder arthroscopy with rotator cuff repair and subacromial decompression  05/15/2012    Procedure: SHOULDER ARTHROSCOPY WITH ROTATOR CUFF REPAIR AND SUBACROMIAL DECOMPRESSION;  Surgeon: Johnny Bridge, MD;  Location: [place];  Service: Orthopedics;  Laterality: Right;  RIGHT ARTHROSCOPY SHOULDER DEBRIDEMENT LIMITED, ARTHROSCOPY SHOULDER DECOMPRESSION SUBACROMIAL PARTIAL ACROMIOPLASTY WITH CORACOACROMIAL RELEASE, ARTHROSCOPY SHOULDER WITH ROTATOR CUFF REPAIR  . Knee arthroscopy Right 12/01/2014    Procedure: ARTHROSCOPY KNEE,partial medial menisectomy and plica excision;  Surgeon: Hessie Knows, MD;  Location: [facility];  Service: Orthopedics;  Laterality: Right;    FAMILY HISTORY: Family History  Problem Relation Age of Onset  . Dementia Mother   . Cancer Father     unknown primary  . Heart disease Father   . Thyroid disease Sister   . Cancer Sister     brain tumor  . Dementia Sister   . COPD Brother   . Cancer Brother     bone cancer  . Colon cancer Paternal Grandfather   . Breast cancer Neg Hx      SOCIAL HISTORY:  History   Social History  . Marital Status: Married    Spouse Name: Trilby Drummer  . Number of Children: 2  . Years of Education: 12   Occupational History  .      retired   Social History Main Topics  . Smoking status: Never Smoker   . Smokeless tobacco: Never Used  . Alcohol Use: No  . Drug Use: No  . Sexual Activity: Not on file   Other Topics Concern  . Not on file   Social History Narrative   Patient lives at home with her husband Trilby Drummer).   Patient is retired .   Education high school.   Right handed.  Caffeine sweet tea two cups.           PHYSICAL EXAM   Filed Vitals:   12/05/14 1303  BP: 132/80  Pulse: 76  Height: 5\' 6"  (1.676 m)  Weight: 181 lb (82.101 kg)    Not recorded      Body mass index is 29.23 kg/(m^2).  PHYSICAL EXAMNIATION:  Gen: NAD, conversant, well nourised, obese, well groomed                     Cardiovascular: Regular rate rhythm, no peripheral edema, warm, nontender. Eyes: Conjunctivae clear without exudates or hemorrhage Neck: Supple, no carotid bruise. Pulmonary: Clear to auscultation bilaterally   NEUROLOGICAL EXAM:  MENTAL STATUS: Speech:    Speech is normal; fluent and spontaneous with normal comprehension.  Cognition:   Mini-Mental Status Exam 24out of 30,she missed 3 out of 3 recalls,  she is not oriented to date, year, day, animal nam18.  CRANIAL NERVES: CN II: Visual fields are full to confrontation. pupils were equal round reactive to light. CN III, IV, VI: extraocular movement are normal. No ptosis. CN V: Facial sensation is intact to pinprick in all 3 divisions bilaterally. Corneal responses are intact.  CN VII: Face is symmetric with normal eye closure and smile. CN VIII: Hearing is normal to rubbing fingers CN IX, X: Palate elevates symmetrically. Phonation is normal. CN XI: Head turning and shoulder shrug are intact CN XII: Tongue is midline with normal movements and no  atrophy.  MOTOR: There is no pronator drift of out-stretched arms. Muscle bulk and tone are normal. Muscle strength is normal.  REFLEXES: Reflexes are 2+ and symmetric at the biceps, triceps, knees, and ankles. Plantar responses are flexor.  SENSORY: Light touch, pinprick, position sense, and vibration sense are intact in fingers and toes.  COORDINATION: Rapid alternating movements and fine finger movements are intact. There is no dysmetria on finger-to-nose and heel-knee-shin. There are no abnormal or extraneous movements.   GAIT/STANCE:  right knee in wrap cause of recent right arthroscopic surgery, limp,   DIAGNOSTIC DATA (LABS, IMAGING, TESTING) - I reviewed patient records, labs, notes, testing and imaging myself where available.  Lab Results  Component Value Date   WBC 6.7 11/30/2014   HGB 12.0 11/30/2014   HCT 37.1 11/30/2014   MCV 82.9 11/30/2014   PLT 204 11/30/2014      Component Value Date/Time   NA 139 11/30/2014 1136   K 3.5 11/30/2014 1136   CL 104 11/30/2014 1136   CO2 25 11/30/2014 1136   GLUCOSE 92 11/30/2014 1136   BUN 13 11/30/2014 1136   CREATININE 0.92 11/30/2014 1136   CALCIUM 9.4 11/30/2014 1136   PROT 7.7 07/08/2014 1025   ALBUMIN 4.4 07/08/2014 1025   AST 25 07/08/2014 1025   ALT 16 07/08/2014 1025   ALKPHOS 70 07/08/2014 1025   BILITOT 0.4 07/08/2014 1025   GFRNONAA >60 11/30/2014 1136   GFRAA >60 11/30/2014 1136   Lab Results  Component Value Date   CHOL 294* 07/08/2014   HDL 45.60 07/08/2014   LDLDIRECT 164.0 07/08/2014   TRIG * 07/08/2014    480.0 Triglyceride is over 400; calculations on Lipids are invalid.   CHOLHDL 6 07/08/2014   Lab Results  Component Value Date   HGBA1C 6.3 07/08/2014   Lab Results  Component Value Date   VITAMINB12 301 10/27/2014   Lab Results  Component Value Date   TSH 3.82 09/08/2014  ASSESSMENT AND PLAN  Stacy Davis is a 69 y.o. 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 Ph.D.  [facility] [address], [place], [place] [address] Ph: [phone] Fax: [phone]

## 2014-12-09 ENCOUNTER — Telehealth: Payer: Self-pay

## 2014-12-09 NOTE — Telephone Encounter (Signed)
I spoke to the patient in re the CREAD study. The patient would like to be called back in a week.

## 2014-12-15 ENCOUNTER — Telehealth: Payer: Self-pay

## 2014-12-15 ENCOUNTER — Encounter: Payer: Self-pay | Admitting: Family Medicine

## 2014-12-15 NOTE — Telephone Encounter (Signed)
Called patient to notify her of being due for a mammogram. Patient declined any help scheduling one at the moment. 

## 2014-12-16 ENCOUNTER — Telehealth: Payer: Self-pay

## 2014-12-16 NOTE — Telephone Encounter (Signed)
I left a message for the patient to return my call.

## 2014-12-19 ENCOUNTER — Telehealth: Payer: Self-pay

## 2014-12-19 NOTE — Telephone Encounter (Signed)
I spoke to the patient in re the CREAD study. The patient requested to be called back tomorrow.

## 2014-12-21 ENCOUNTER — Other Ambulatory Visit: Payer: Self-pay | Admitting: Family Medicine

## 2014-12-21 NOTE — Telephone Encounter (Signed)
Received refill request electronically Last refill 06/09/12 #30 Last office visit 10/06/14 See PCP listed Is it okay to refill medication?

## 2014-12-22 NOTE — Telephone Encounter (Signed)
Medication phoned to pharmacy.  

## 2014-12-22 NOTE — Telephone Encounter (Addendum)
Please call in. Will have OV with the new PCP in near future.  I'll sign off otherwise. Thanks.  Routed to new PCP as FYI.

## 2014-12-22 NOTE — Telephone Encounter (Signed)
ok 

## 2014-12-28 ENCOUNTER — Encounter: Payer: Self-pay | Admitting: Family Medicine

## 2015-01-03 ENCOUNTER — Telehealth: Payer: Self-pay

## 2015-01-03 NOTE — Telephone Encounter (Signed)
I spoke to the patient in re the CREAD study. The patient stated that she is currently taking LORazepam (ATIVAN) 0.5 MG tablet at least once everyday. Thus, the patient does not qualify for the study.

## 2015-01-05 ENCOUNTER — Ambulatory Visit (INDEPENDENT_AMBULATORY_CARE_PROVIDER_SITE_OTHER): Payer: PPO | Admitting: Family Medicine

## 2015-01-05 ENCOUNTER — Encounter: Payer: Self-pay | Admitting: Family Medicine

## 2015-01-05 ENCOUNTER — Other Ambulatory Visit: Payer: PPO

## 2015-01-05 VITALS — BP 118/70 | HR 80 | Temp 98.5°F | Resp 16 | Ht 63.0 in | Wt 177.0 lb

## 2015-01-05 DIAGNOSIS — E119 Type 2 diabetes mellitus without complications: Secondary | ICD-10-CM

## 2015-01-05 DIAGNOSIS — Z1211 Encounter for screening for malignant neoplasm of colon: Secondary | ICD-10-CM | POA: Diagnosis not present

## 2015-01-05 DIAGNOSIS — Z7189 Other specified counseling: Secondary | ICD-10-CM

## 2015-01-05 DIAGNOSIS — F0390 Unspecified dementia without behavioral disturbance: Secondary | ICD-10-CM

## 2015-01-05 DIAGNOSIS — Z1239 Encounter for other screening for malignant neoplasm of breast: Secondary | ICD-10-CM

## 2015-01-05 DIAGNOSIS — Z7689 Persons encountering health services in other specified circumstances: Secondary | ICD-10-CM

## 2015-01-05 NOTE — Progress Notes (Signed)
Subjective:    Patient ID: Stacy Davis, female    DOB: [birth year], 69 y.o.   MRN: [id number]  HPI Patient is a very pleasant 69 year old white female with a history of diabetes mellitus type 2, hypertension, hyperlipidemia, hypothyroidism, von Willebrand's disease who has recently been seeing a neurologist due to memory loss. She is currently on Aricept 10 mg by mouth daily for dementia. She also takes oxycodone for her shoulder pain and back pain. She takes lorazepam as needed for anxiety. She also uses Ambien as needed to help her sleep. She is using Flexeril periodically to help with back pain. I had a discussion today with the patient regarding these medications and their possible contribution to confusion. I have recommended that she try to limit the use of these medications is much as possible. She is overdue for mammogram as well as a colonoscopy. She is also due for a CBC, CMP, fasting lipid panel, and a TSH. Past Medical History  Diagnosis Date  . Back pain     h/o DDD since 2010  . Arthritis     ?OA vs Rheum (established with Dr. Jefm Bryant pending blood work)  . GERD (gastroesophageal reflux disease)   . Hyperlipidemia     no current med.  . Hypothyroid   . Von Willebrand disease   . Headache(784.0)     occasional  . Memory impairment   . History of skin cancer   . Articular cartilage disorder of shoulder 04/2012    right  . Rotator cuff rupture 04/2012    right  . Impingement syndrome of right shoulder 04/2012  . Diabetes mellitus     NIDDM  . Anxiety   . Dental crowns present     also dental caps  . Right rotator cuff tear 05/15/2012  . Hypertension   . Hyperthyroidism    Past Surgical History  Procedure Laterality Date  . Tubal ligation    . Breast surgery      hematoma evacuation due to trauma  . Cardiac catheterization  03/06/2001  . Colonoscopy  01/20/2006 per patient  . Appendectomy    . Orif wrist fracture      left  . Lumbar  laminectomy/decompression microdiscectomy  03/01/2009    right L4-5; arthrodesis L4-5  . Shoulder arthroscopy with rotator cuff repair and subacromial decompression  05/15/2012    Procedure: SHOULDER ARTHROSCOPY WITH ROTATOR CUFF REPAIR AND SUBACROMIAL DECOMPRESSION;  Surgeon: Johnny Bridge, MD;  Location: [place];  Service: Orthopedics;  Laterality: Right;  RIGHT ARTHROSCOPY SHOULDER DEBRIDEMENT LIMITED, ARTHROSCOPY SHOULDER DECOMPRESSION SUBACROMIAL PARTIAL ACROMIOPLASTY WITH CORACOACROMIAL RELEASE, ARTHROSCOPY SHOULDER WITH ROTATOR CUFF REPAIR  . Knee arthroscopy Right 12/01/2014    Procedure: ARTHROSCOPY KNEE,partial medial menisectomy and plica excision;  Surgeon: Hessie Knows, MD;  Location: [facility];  Service: Orthopedics;  Laterality: Right;  . Fracture surgery    . Cesarean section      x 2   Current Outpatient Prescriptions on File Prior to Visit  Medication Sig Dispense Refill  . cyclobenzaprine (FLEXERIL) 10 MG tablet TAKE 1/2 TO 1 TABLET BY MOUTH 3 TIMES A DAY AS NEEDED 40 tablet 0  . donepezil (ARICEPT) 10 MG tablet Take 1 tablet (10 mg total) by mouth at bedtime. 30 tablet 11  . HYDROcodone-acetaminophen (NORCO) 5-325 MG per tablet Take 1-2 tablets by mouth every 6 (six) hours as needed for moderate pain. 40 tablet 0  . hydroxychloroquine (PLAQUENIL) 200 MG tablet Take 200 mg by mouth daily.    Marland Kitchen  levothyroxine (SYNTHROID, LEVOTHROID) 88 MCG tablet TAKE 1 TABLET BY MOUTH ONCE A DAY 30 tablet 3  . lisinopril (ZESTRIL) 2.5 MG tablet Take 1 tablet (2.5 mg total) by mouth daily. 90 tablet 3  . LORazepam (ATIVAN) 0.5 MG tablet TAKE 1 TABLET BY MOUTH 3 TIMES A DAY AS NEEDED FOR ANXIETY 30 tablet 0  . meloxicam (MOBIC) 15 MG tablet Take 1 tablet (15 mg total) by mouth daily. (Patient taking differently: Take 15 mg by mouth as needed. )    . metFORMIN (GLUCOPHAGE-XR) 500 MG 24 hr tablet TAKE 1 TABLET BY MOUTH TWICE A DAY WITH MEALS 60 tablet 11  . metoprolol tartrate  (LOPRESSOR) 25 MG tablet TAKE 1 TABLET BY MOUTH TWICE A DAY 60 tablet 5  . omeprazole (PRILOSEC) 40 MG capsule Take 40 mg by mouth daily.    Marland Kitchen oxyCODONE-acetaminophen (PERCOCET/ROXICET) 5-325 MG per tablet TAKE 1/2 TO 1 TABLET BY MOUTH EVERY 8 HOURS AS NEEDED FOR PAIN. LIMITUSE AS MUCH AS POSSIBLE 30 tablet 0  . pantoprazole (PROTONIX) 40 MG tablet Take 40 mg by mouth every morning.     . zolpidem (AMBIEN) 10 MG tablet TAKE 1 TABLET BY MOUTH EVERY NIGHT AT BEDTIME 30 tablet 2   No current facility-administered medications on file prior to visit.   Allergies  Allergen Reactions  . Tramadol Other (See Comments)    CHANGES IN MEMORY   Social History   Social History  . Marital Status: Married    Spouse Name: Trilby Drummer  . Number of Children: 2  . Years of Education: 12   Occupational History  .      retired   Social History Main Topics  . Smoking status: Never Smoker   . Smokeless tobacco: Never Used  . Alcohol Use: No  . Drug Use: No  . Sexual Activity: Not Currently    Birth Control/ Protection: Surgical   Other Topics Concern  . Not on file   Social History Narrative   Patient lives at home with her husband Trilby Drummer).   Patient is retired .   Education high school.   Right handed.   Caffeine sweet tea two cups.         Family History  Problem Relation Age of Onset  . Dementia Mother   . Arthritis Mother   . Cancer Father     unknown primary  . Heart disease Father   . Thyroid disease Sister   . Cancer Sister     brain tumor  . Dementia Sister   . COPD Brother   . Cancer Brother     bone cancer  . Colon cancer Paternal Grandfather   . Breast cancer Neg Hx       Review of Systems  All other systems reviewed and are negative.      Objective:   Physical Exam  Constitutional: She is oriented to person, place, and time. She appears well-developed and well-nourished.  Neck: Neck supple. No JVD present. No thyromegaly present.  Cardiovascular: Normal rate,  regular rhythm, normal heart sounds and intact distal pulses.  Exam reveals no gallop and no friction rub.   No murmur heard. Pulmonary/Chest: Effort normal and breath sounds normal. No respiratory distress. She has no wheezes. She has no rales. She exhibits no tenderness.  Abdominal: Soft. Bowel sounds are normal. She exhibits no distension and no mass. There is no tenderness. There is no rebound and no guarding.  Musculoskeletal: She exhibits no edema.  Lymphadenopathy:  She has no cervical adenopathy.  Neurological: She is alert and oriented to person, place, and time. She has normal reflexes.          Assessment & Plan:  Establishing care with new doctor, encounter for  Breast cancer screening  Colon cancer screening  Diabetes mellitus type II, controlled - Plan: COMPLETE METABOLIC PANEL WITH GFR, CBC with Differential/Platelet, Lipid panel, Hemoglobin A1c, Microalbumin, urine  Dementia, without behavioral disturbance - Plan: TSH  Patient's blood pressure today is well controlled. I will schedule her for a mammogram as well as a colonoscopy. Her immunizations are up-to-date. I would like her to return fasting for a CBC, CMP, fasting lipid panel, urine microalbumin, and a hemoglobin A1c. I will also check a TSH to monitor her hypothyroidism as well as to see if there is any contribution to that her memory loss.

## 2015-01-09 ENCOUNTER — Ambulatory Visit: Payer: PPO | Admitting: Family Medicine

## 2015-01-16 ENCOUNTER — Other Ambulatory Visit: Payer: Self-pay | Admitting: Family Medicine

## 2015-01-18 ENCOUNTER — Other Ambulatory Visit: Payer: Self-pay | Admitting: Family Medicine

## 2015-01-18 NOTE — Telephone Encounter (Signed)
?   OK to Refill  

## 2015-01-19 NOTE — Telephone Encounter (Signed)
Call placed to patient husband, Trilby Drummer. States that patient will come to office on 01/20/2015 fasting for labs.

## 2015-01-19 NOTE — Telephone Encounter (Signed)
Ok but she needs lab work (fasting).  Can we notify husband.  Wife has memory issues.

## 2015-01-20 ENCOUNTER — Other Ambulatory Visit: Payer: PPO

## 2015-01-20 LAB — CBC WITH DIFFERENTIAL/PLATELET
Basophils Absolute: 0.1 K/uL (ref 0.0–0.1)
Basophils Relative: 1 % (ref 0–1)
Eosinophils Absolute: 0.1 K/uL (ref 0.0–0.7)
Eosinophils Relative: 2 % (ref 0–5)
HCT: 35.9 % — ABNORMAL LOW (ref 36.0–46.0)
Hemoglobin: 11.6 g/dL — ABNORMAL LOW (ref 12.0–15.0)
Lymphocytes Relative: 35 % (ref 12–46)
Lymphs Abs: 2 K/uL (ref 0.7–4.0)
MCH: 26.2 pg (ref 26.0–34.0)
MCHC: 32.3 g/dL (ref 30.0–36.0)
MCV: 81 fL (ref 78.0–100.0)
MPV: 10.2 fL (ref 8.6–12.4)
Monocytes Absolute: 0.3 K/uL (ref 0.1–1.0)
Monocytes Relative: 6 % (ref 3–12)
Neutro Abs: 3.2 K/uL (ref 1.7–7.7)
Neutrophils Relative %: 56 % (ref 43–77)
Platelets: 242 K/uL (ref 150–400)
RBC: 4.43 MIL/uL (ref 3.87–5.11)
RDW: 15.1 % (ref 11.5–15.5)
WBC: 5.7 K/uL (ref 4.0–10.5)

## 2015-01-20 LAB — COMPLETE METABOLIC PANEL WITH GFR
ALT: 19 U/L (ref 6–29)
AST: 24 U/L (ref 10–35)
Albumin: 4.4 g/dL (ref 3.6–5.1)
Alkaline Phosphatase: 67 U/L (ref 33–130)
BUN: 20 mg/dL (ref 7–25)
CO2: 23 mmol/L (ref 20–31)
Calcium: 9.1 mg/dL (ref 8.6–10.4)
Chloride: 106 mmol/L (ref 98–110)
Creat: 1.01 mg/dL — ABNORMAL HIGH (ref 0.50–0.99)
GFR, Est African American: 67 mL/min (ref >=60)
GFR, Est Non African American: 58 mL/min — ABNORMAL LOW (ref >=60)
Glucose, Bld: 100 mg/dL — ABNORMAL HIGH (ref 70–99)
Potassium: 4.2 mmol/L (ref 3.5–5.3)
Sodium: 144 mmol/L (ref 135–146)
Total Bilirubin: 0.3 mg/dL (ref 0.2–1.2)
Total Protein: 6.7 g/dL (ref 6.1–8.1)

## 2015-01-20 LAB — HEMOGLOBIN A1C
Hgb A1c MFr Bld: 6.1 % — ABNORMAL HIGH (ref <5.7)
Mean Plasma Glucose: 128 mg/dL — ABNORMAL HIGH (ref <117)

## 2015-01-20 LAB — LIPID PANEL
Cholesterol: 285 mg/dL — ABNORMAL HIGH (ref 125–200)
HDL: 40 mg/dL — ABNORMAL LOW (ref >=46)
Total CHOL/HDL Ratio: 7.1 Ratio — ABNORMAL HIGH (ref <=5.0)
Triglycerides: 525 mg/dL — ABNORMAL HIGH (ref <150)

## 2015-01-20 LAB — TSH: TSH: 4.636 u[IU]/mL — ABNORMAL HIGH (ref 0.350–4.500)

## 2015-01-21 LAB — MICROALBUMIN, URINE: Microalb, Ur: 2.3 mg/dL — ABNORMAL HIGH (ref <2.0)

## 2015-01-25 ENCOUNTER — Other Ambulatory Visit: Payer: Self-pay | Admitting: Family Medicine

## 2015-01-25 MED ORDER — FENOFIBRATE 160 MG PO TABS: 160.0000 mg | ORAL_TABLET | Freq: Every day | ORAL | Status: DC

## 2015-02-03 ENCOUNTER — Ambulatory Visit: Payer: PPO

## 2015-02-07 ENCOUNTER — Other Ambulatory Visit: Payer: Self-pay | Admitting: Family Medicine

## 2015-02-21 ENCOUNTER — Other Ambulatory Visit: Payer: Self-pay | Admitting: Family Medicine

## 2015-02-21 NOTE — Telephone Encounter (Signed)
?   OK to Refill  

## 2015-02-21 NOTE — Telephone Encounter (Signed)
ok 

## 2015-02-21 NOTE — Telephone Encounter (Signed)
LRF 01/19/15 #40  LOV 01/05/15 new pt  OK refill?

## 2015-02-21 NOTE — Telephone Encounter (Signed)
Ok to refill 

## 2015-02-21 NOTE — Telephone Encounter (Signed)
Not seen here anymore.

## 2015-02-21 NOTE — Telephone Encounter (Signed)
Last refilled 10/13/14. Okay to refill this medication?

## 2015-02-23 NOTE — Telephone Encounter (Signed)
Prescription printed and patient made aware to come to office to pick up after 2pm on 02/23/2015.

## 2015-02-24 NOTE — Telephone Encounter (Signed)
Medication refilled per protocol. 

## 2015-03-07 ENCOUNTER — Ambulatory Visit (INDEPENDENT_AMBULATORY_CARE_PROVIDER_SITE_OTHER): Payer: PPO | Admitting: Neurology

## 2015-03-07 ENCOUNTER — Encounter: Payer: Self-pay | Admitting: Neurology

## 2015-03-07 VITALS — BP 113/61 | HR 73 | Ht 63.0 in | Wt 179.0 lb

## 2015-03-07 DIAGNOSIS — G4700 Insomnia, unspecified: Secondary | ICD-10-CM

## 2015-03-07 DIAGNOSIS — R413 Other amnesia: Secondary | ICD-10-CM | POA: Diagnosis not present

## 2015-03-07 NOTE — Progress Notes (Signed)
Chief Complaint  Patient presents with  . Memory Loss    MMSE 25/30 - 8 animals.  She is here with her husband, Trilby Drummer.  Feels her memory is unchanged from her last visit three months ago.   Chief Complaint  Patient presents with  . Memory Loss    MMSE 25/30 - 8 animals.  She is here with her husband, Trilby Drummer.  Feels her memory is unchanged from her last visit three months ago.      PATIENT: Stacy Davis DOB: Aug 18, 1945  Chief Complaint  Patient presents with  . Memory Loss    MMSE 25/30 - 8 animals.  She is here with her husband, Trilby Drummer.  Feels her memory is unchanged from her last visit three months ago.    HISTORICAL  Stacy Davis is a 69 years old right-handed female, accompanied by her husband, referred by her primary care physician Dr. Damita Dunnings for evaluation of memory loss.  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  I have personally reviewed CAT scan in 2013, mild atrophy  Laboratory evaluation in 2016 elevated triglyceride 480, cholesterol 294.   UPDATE December 05 2014: She is with her husband at today's clinical visit, continue complains of slow decline memory trouble, to the point of affecting her daily activity, she has difficulty operating remote  control, forget a conversation, has difficulty finding her car in the parking lot, she is also very anxious about her symptoms with a strong family history of Alzheimer's disease.  We have reviewed MRI of the brain, mild atrophy, subcortical gliosis, today's Mini-Mental Status Examination is 24 out of 30   I have reviewed laboratory in 2016, normal TSH, B12, negative RPR  UPDATE Mar 07 2015: She still pays bill, she is able to keep up with her yard work,house work, cooks, she still drives short distance. 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 Tramadol Other (See Comments)    CHANGES IN MEMORY    HOME MEDICATIONS: Current Outpatient Prescriptions  Medication Sig Dispense Refill  . cyclobenzaprine (FLEXERIL) 10 MG tablet TAKE 1/2 TO 1 TABLET BY MOUTH 3 TIMES A DAY AS NEEDED 40 tablet 0  . hydroxychloroquine (PLAQUENIL) 200 MG tablet Take 200 mg by mouth daily.    Marland Kitchen levothyroxine (SYNTHROID, LEVOTHROID) 88 MCG tablet TAKE 1 TABLET BY MOUTH ONCE A DAY 30 tablet 3  . lisinopril (ZESTRIL) 2.5 MG tablet Take 1 tablet (2.5 mg total) by mouth daily. 90 tablet 3  . LORazepam (ATIVAN) 0.5 MG tablet Take 1 tablet (0.5 mg total) by mouth  3 (three) times daily as needed for anxiety. 30 tablet 0  . meloxicam (MOBIC) 15 MG tablet Take 1 tablet (15 mg total) by mouth daily.    . metFORMIN (GLUCOPHAGE-XR) 500 MG 24 hr tablet TAKE 1 TABLET BY MOUTH TWICE A DAY WITH MEALS 60 tablet 11  . metoprolol tartrate (LOPRESSOR) 25 MG tablet TAKE 1 TABLET BY MOUTH TWICE A DAY 60 tablet 5  . oxyCODONE-acetaminophen (PERCOCET/ROXICET) 5-325 MG per tablet TAKE 1/2 TO 1 TABLET BY MOUTH EVERY 8 HOURS AS NEEDED FOR PAIN. LIMITUSE AS MUCH AS POSSIBLE 30 tablet 0  .  pantoprazole (PROTONIX) 40 MG tablet Take 40 mg by mouth daily.    Marland Kitchen zolpidem (AMBIEN) 10 MG tablet TAKE 1 TABLET BY MOUTH EVERY NIGHT AT BEDTIME (Patient taking differently: TAKE 1 TABLET BY MOUTH EVERY NIGHT AT BEDTIME prn) 30 tablet 2   No current facility-administered medications for this visit.    PAST MEDICAL HISTORY: Past Medical History  Diagnosis Date  . Back pain     h/o DDD since 2010  . Arthritis     ?OA vs Rheum (established with Dr. Jefm Bryant pending blood work)  . GERD (gastroesophageal reflux disease)   . Hyperlipidemia     no current med.  . Hypothyroid   . Von Willebrand disease ([place])   . Headache(784.0)     occasional  . Memory impairment   . History of skin cancer   . Articular cartilage disorder of shoulder 04/2012    right  . Rotator cuff rupture 04/2012    right  . Impingement syndrome of right shoulder 04/2012  . Diabetes mellitus     NIDDM  . Anxiety   . Dental crowns present     also dental caps  . Right rotator cuff tear 05/15/2012  . Hypertension   . Hyperthyroidism     PAST SURGICAL HISTORY: Past Surgical History  Procedure Laterality Date  . Tubal ligation    . Breast surgery      hematoma evacuation due to trauma  . Cardiac catheterization  03/06/2001  . Colonoscopy  01/20/2006 per patient  . Appendectomy    . Orif wrist fracture      left  . Lumbar laminectomy/decompression microdiscectomy  03/01/2009    right L4-5; arthrodesis L4-5  . Shoulder arthroscopy with rotator cuff repair and subacromial decompression  05/15/2012    Procedure: SHOULDER ARTHROSCOPY WITH ROTATOR CUFF REPAIR AND SUBACROMIAL DECOMPRESSION;  Surgeon: Johnny Bridge, MD;  Location: [facility];  Service: Orthopedics;  Laterality: Right;  RIGHT ARTHROSCOPY SHOULDER DEBRIDEMENT LIMITED, ARTHROSCOPY SHOULDER DECOMPRESSION SUBACROMIAL PARTIAL ACROMIOPLASTY WITH CORACOACROMIAL RELEASE, ARTHROSCOPY SHOULDER WITH ROTATOR CUFF REPAIR  . Knee arthroscopy Right  12/01/2014    Procedure: ARTHROSCOPY KNEE,partial medial menisectomy and plica excision;  Surgeon: Hessie Knows, MD;  Location: [facility];  Service: Orthopedics;  Laterality: Right;  . Fracture surgery    . Cesarean section      x 2    FAMILY HISTORY: Family History  Problem Relation Age of Onset  . Dementia Mother   . Arthritis Mother   . Cancer Father     unknown primary  . Heart disease Father   . Thyroid disease Sister   . Cancer Sister     brain tumor  . Dementia Sister   . COPD Brother   . Cancer Brother     bone cancer  . Colon cancer Paternal Grandfather   . Breast cancer Neg Hx     SOCIAL  HISTORY:  Social History   Social History  . Marital Status: Married    Spouse Name: Trilby Drummer  . Number of Children: 2  . Years of Education: 12   Occupational History  .      retired   Social History Main Topics  . Smoking status: Never Smoker   . Smokeless tobacco: Never Used  . Alcohol Use: No  . Drug Use: No  . Sexual Activity: Not Currently    Birth Control/ Protection: Surgical   Other Topics Concern  . Not on file   Social History Narrative   Patient lives at home with her husband Trilby Drummer).   Patient is retired .   Education high school.   Right handed.   Caffeine sweet tea two cups.           PHYSICAL EXAM   Filed Vitals:   03/07/15 1343  BP: 113/61  Pulse: 73  Height: 5\' 3"  (1.6 m)  Weight: 179 lb (81.194 kg)    Not recorded      Body mass index is 31.72 kg/(m^2).  PHYSICAL EXAMNIATION:  Gen: NAD, conversant, well nourised, obese, well groomed                     Cardiovascular: Regular rate rhythm, no peripheral edema, warm, nontender. Eyes: Conjunctivae clear without exudates or hemorrhage Neck: Supple, no carotid bruise. Pulmonary: Clear to auscultation bilaterally   NEUROLOGICAL EXAM: MENTAL STATUS: Speech:    Speech is normal; fluent and spontaneous with normal comprehension.  Cognition:Mini-Mental Status Examination is 25 out  of 30, animal naming is 8     Orientation to time, place and person: she is not oriented to year,Dr., season     Recent and remote memory: she missed 2 out of 3 recalls     Normal Attention span and concentration     Normal Language, naming, repeating,spontaneous speech     Fund of knowledge   CRANIAL NERVES: CN II: Visual fields are full to confrontation. pupils were equal round reactive to light. CN III, IV, VI: extraocular movement are normal. No ptosis. CN V: Facial sensation is intact to pinprick in all 3 divisions bilaterally. Corneal responses are intact.  CN VII: Face is symmetric with normal eye closure and smile. CN VIII: Hearing is normal to rubbing fingers CN IX, X: Palate elevates symmetrically. Phonation is normal. CN XI: Head turning and shoulder shrug are intact CN XII: Tongue is midline with normal movements and no atrophy.  MOTOR: There is no pronator drift of out-stretched arms. Muscle bulk and tone are normal. Muscle strength is normal.  REFLEXES: Reflexes are 2+ and symmetric at the biceps, triceps, knees, and ankles. Plantar responses are flexor.  SENSORY: Light touch, pinprick, position sense, and vibration sense are intact in fingers and toes.  COORDINATION: Rapid alternating movements and fine finger movements are intact. There is no dysmetria on finger-to-nose and heel-knee-shin. There are no abnormal or extraneous movements.   GAIT/STANCE:  right knee in wrap cause of recent right arthroscopic surgery, limp,   DIAGNOSTIC DATA (LABS, IMAGING, TESTING) - I reviewed patient records, labs, notes, testing and imaging myself where available.  Lab Results  Component Value Date   WBC 5.7 01/20/2015   HGB 11.6* 01/20/2015   HCT 35.9* 01/20/2015   MCV 81.0 01/20/2015   PLT 242 01/20/2015      Component Value Date/Time   NA 144 01/20/2015 0928   K 4.2 01/20/2015 0928   CL 106  01/20/2015 0928   CO2 23 01/20/2015 0928   GLUCOSE 100* 01/20/2015 0928    BUN 20 01/20/2015 0928   CREATININE 1.01* 01/20/2015 0928   CREATININE 0.92 11/30/2014 1136   CALCIUM 9.1 01/20/2015 0928   PROT 6.7 01/20/2015 0928   ALBUMIN 4.4 01/20/2015 0928   AST 24 01/20/2015 0928   ALT 19 01/20/2015 0928   ALKPHOS 67 01/20/2015 0928   BILITOT 0.3 01/20/2015 0928   GFRNONAA 58* 01/20/2015 0928   GFRNONAA >60 11/30/2014 1136   GFRAA 67 01/20/2015 0928   GFRAA >60 11/30/2014 1136   Lab Results  Component Value Date   CHOL 285* 01/20/2015   HDL 40* 01/20/2015   LDLCALC NOT CALC 01/20/2015   LDLDIRECT 164.0 07/08/2014   TRIG 525* 01/20/2015   CHOLHDL 7.1* 01/20/2015   Lab Results  Component Value Date   HGBA1C 6.1* 01/20/2015   Lab Results  Component Value Date   VITAMINB12 301 10/27/2014   Lab Results  Component Value Date   TSH 4.636* 01/20/2015      ASSESSMENT AND PLAN  Stacy Davis is a 69 y.o. 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 Ph.D.  [facility] [address] [place], [place] [address] Ph: [phone] Fax: [phone]

## 2015-03-07 NOTE — Patient Instructions (Signed)
Please OBSERVE her sleep pattern, may consider sleep study if she has frequent snoring, waking up episode during sleep, if she continue have excessive daytime sleepiness, and  Fatigue   Stopped taking lorazepam every night if she wants to be involved in dementia research trial

## 2015-03-09 ENCOUNTER — Other Ambulatory Visit: Payer: Self-pay | Admitting: Family Medicine

## 2015-03-09 NOTE — Telephone Encounter (Signed)
Refill appropriate and filled per protocol. 

## 2015-03-10 ENCOUNTER — Other Ambulatory Visit: Payer: Self-pay

## 2015-03-15 LAB — HM DIABETES EYE EXAM

## 2015-03-21 ENCOUNTER — Encounter: Payer: Self-pay | Admitting: Family Medicine

## 2015-03-27 ENCOUNTER — Other Ambulatory Visit: Payer: Self-pay | Admitting: Obstetrics and Gynecology

## 2015-03-29 LAB — CYTOLOGY - PAP

## 2015-04-01 ENCOUNTER — Other Ambulatory Visit: Payer: Self-pay | Admitting: Family Medicine

## 2015-04-03 NOTE — Telephone Encounter (Signed)
Medication refilled per protocol. 

## 2015-05-08 ENCOUNTER — Other Ambulatory Visit: Payer: Self-pay | Admitting: Family Medicine

## 2015-05-08 NOTE — Telephone Encounter (Signed)
Medication refilled per protocol. 

## 2015-05-08 NOTE — Telephone Encounter (Signed)
Ok wit refill but NTBS

## 2015-05-08 NOTE — Telephone Encounter (Signed)
LRF 02/24/15 #40  Has appt tomorrow.  OK refill?

## 2015-05-09 ENCOUNTER — Encounter: Payer: Self-pay | Admitting: Family Medicine

## 2015-05-09 ENCOUNTER — Ambulatory Visit (INDEPENDENT_AMBULATORY_CARE_PROVIDER_SITE_OTHER): Payer: PPO | Admitting: Family Medicine

## 2015-05-09 VITALS — BP 118/68 | HR 80 | Temp 98.2°F | Resp 18 | Ht 63.0 in | Wt 185.0 lb

## 2015-05-09 DIAGNOSIS — E039 Hypothyroidism, unspecified: Secondary | ICD-10-CM | POA: Diagnosis not present

## 2015-05-09 DIAGNOSIS — M545 Low back pain: Secondary | ICD-10-CM | POA: Diagnosis not present

## 2015-05-09 DIAGNOSIS — R413 Other amnesia: Secondary | ICD-10-CM

## 2015-05-09 DIAGNOSIS — M542 Cervicalgia: Secondary | ICD-10-CM

## 2015-05-09 DIAGNOSIS — E119 Type 2 diabetes mellitus without complications: Secondary | ICD-10-CM | POA: Diagnosis not present

## 2015-05-09 DIAGNOSIS — E785 Hyperlipidemia, unspecified: Secondary | ICD-10-CM | POA: Diagnosis not present

## 2015-05-09 MED ORDER — PREDNISONE 20 MG PO TABS: ORAL_TABLET | ORAL | Status: DC

## 2015-05-09 NOTE — Progress Notes (Signed)
Subjective:    Patient ID: Stacy Davis, female    DOB: 1945/11/13, 69 y.o.   MRN: [id number]  HPI 01/05/15 Patient is a very pleasant 69 year old white female with a history of diabetes mellitus type 2, hypertension, hyperlipidemia, hypothyroidism, von Willebrand's disease who has recently been seeing a neurologist due to memory loss. She is currently on Aricept 10 mg by mouth daily for dementia. She also takes oxycodone for her shoulder pain and back pain. She takes lorazepam as needed for anxiety. She also uses Ambien as needed to help her sleep. She is using Flexeril periodically to help with back pain. I had a discussion today with the patient regarding these medications and their possible contribution to confusion. I have recommended that she try to limit the use of these medications is much as possible. She is overdue for mammogram as well as a colonoscopy. She is also due for a CBC, CMP, fasting lipid panel, and a TSH.  At that time, my plan was: Patient's blood pressure today is well controlled. I will schedule her for a mammogram as well as a colonoscopy. Her immunizations are up-to-date. I would like her to return fasting for a CBC, CMP, fasting lipid panel, urine microalbumin, and a hemoglobin A1c. I will also check a TSH to monitor her hypothyroidism as well as to see if there is any contribution to that her memory loss.  Orders Only on 03/27/2015  Component Date Value Ref Range Status  . CYTOLOGY - PAP 03/27/2015 PAP RESULT   Final  Abstract on 03/21/2015  Component Date Value Ref Range Status  . HM Diabetic Eye Exam 03/15/2015 No Retinopathy  No Retinopathy Final   [facility]  Office Visit on 01/05/2015  Component Date Value Ref Range Status  . Sodium 01/20/2015 144  135 - 146 mmol/L Final  . Potassium 01/20/2015 4.2  3.5 - 5.3 mmol/L Final  . Chloride 01/20/2015 106  98 - 110 mmol/L Final  . CO2 01/20/2015 23  20 - 31 mmol/L Final  . Glucose, Bld 01/20/2015 100*  70 - 99 mg/dL Final  . BUN 01/20/2015 20  7 - 25 mg/dL Final  . Creat 01/20/2015 1.01* 0.50 - 0.99 mg/dL Final  . Total Bilirubin 01/20/2015 0.3  0.2 - 1.2 mg/dL Final  . Alkaline Phosphatase 01/20/2015 67  33 - 130 U/L Final  . AST 01/20/2015 24  10 - 35 U/L Final  . ALT 01/20/2015 19  6 - 29 U/L Final  . Total Protein 01/20/2015 6.7  6.1 - 8.1 g/dL Final  . Albumin 01/20/2015 4.4  3.6 - 5.1 g/dL Final  . Calcium 01/20/2015 9.1  8.6 - 10.4 mg/dL Final  . GFR, Est African American 01/20/2015 67  >=60 mL/min Final  . GFR, Est Non African American 01/20/2015 58* >=60 mL/min Final   Comment:   The estimated GFR is a calculation valid for adults (>=78 years old) that uses the CKD-EPI algorithm to adjust for age and sex. It is   not to be used for children, pregnant women, hospitalized patients,    patients on dialysis, or with rapidly changing kidney function. According to the NKDEP, eGFR >89 is normal, 60-89 shows mild impairment, 30-59 shows moderate impairment, 15-29 shows severe impairment and <15 is ESRD.     . WBC 01/20/2015 5.7  4.0 - 10.5 K/uL Final  . RBC 01/20/2015 4.43  3.87 - 5.11 MIL/uL Final  . Hemoglobin 01/20/2015 11.6* 12.0 - 15.0 g/dL Final  .  HCT 01/20/2015 35.9* 36.0 - 46.0 % Final  . MCV 01/20/2015 81.0  78.0 - 100.0 fL Final  . MCH 01/20/2015 26.2  26.0 - 34.0 pg Final  . MCHC 01/20/2015 32.3  30.0 - 36.0 g/dL Final  . RDW 01/20/2015 15.1  11.5 - 15.5 % Final  . Platelets 01/20/2015 242  150 - 400 K/uL Final  . MPV 01/20/2015 10.2  8.6 - 12.4 fL Final  . Neutrophils Relative % 01/20/2015 56  43 - 77 % Final  . Neutro Abs 01/20/2015 3.2  1.7 - 7.7 K/uL Final  . Lymphocytes Relative 01/20/2015 35  12 - 46 % Final  . Lymphs Abs 01/20/2015 2.0  0.7 - 4.0 K/uL Final  . Monocytes Relative 01/20/2015 6  3 - 12 % Final  . Monocytes Absolute 01/20/2015 0.3  0.1 - 1.0 K/uL Final  . Eosinophils Relative 01/20/2015 2  0 - 5 % Final  . Eosinophils Absolute 01/20/2015 0.1   0.0 - 0.7 K/uL Final  . Basophils Relative 01/20/2015 1  0 - 1 % Final  . Basophils Absolute 01/20/2015 0.1  0.0 - 0.1 K/uL Final  . Smear Review 01/20/2015 Criteria for review not met   Final  . Cholesterol 01/20/2015 285* 125 - 200 mg/dL Final  . Triglycerides 01/20/2015 525* <150 mg/dL Final  . HDL 01/20/2015 40* >=46 mg/dL Final  . Total CHOL/HDL Ratio 01/20/2015 7.1* <=5.0 Ratio Final  . VLDL 01/20/2015 NOT CALC  <30 mg/dL Final   Comment:   Not calculated due to Triglyceride >400. Suggest ordering Direct LDL (Unit Code: [phone]).   . LDL Cholesterol 01/20/2015 NOT CALC  <130 mg/dL Final   Comment:   Not calculated due to Triglyceride >400. Suggest ordering Direct LDL (Unit Code: [phone]).   Total Cholesterol/HDL Ratio:CHD Risk                        Coronary Heart Disease Risk Table                                        Men       Women          1/2 Average Risk              3.4        3.3              Average Risk              5.0        4.4           2X Average Risk              9.6        7.1           3X Average Risk             23.4       11.0 Use the calculated Patient Ratio above and the CHD Risk table  to determine the patient's CHD Risk.   . Hgb A1c MFr Bld 01/20/2015 6.1* <5.7 % Final   Comment:  According to the ADA Clinical Practice Recommendations for 2011, when HbA1c is used as a screening test:     >=6.5%   Diagnostic of Diabetes Mellitus            (if abnormal result is confirmed)   5.7-6.4%   Increased risk of developing Diabetes Mellitus   References:Diagnosis and Classification of Diabetes Mellitus,Diabetes JQZE,0923,30(QTMAU 1):S62-S69 and Standards of Medical Care in         Diabetes - 2011,Diabetes QJFH,5456,25 (Suppl 1):S11-S61.     . Mean Plasma Glucose 01/20/2015 128* <117 mg/dL Final   Comment:   Footnotes:  (1) ** Please note change in unit of measure and reference  range(s). **     . Microalb, Ur 01/20/2015 2.3* <2.0 mg/dL Final   Comment: The ADA (Diabetes Care 6389;37(DSKAJ 1):S14-S80) has defined abnormalities in albumin excretion as follows:            Category           Result                            (mg/g creatinine)                 Normal:    <30       Microalbuminuria:    30 - 299   Clinical albuminuria:    > or = 300    The ADA recommends that at least two of three specimens collected within a 3 - 6 month period be abnormal before considering a patient to be within a diagnostic category.   Marland Kitchen TSH 01/20/2015 4.636* 0.350 - 4.500 uIU/mL Final  05/09/15 Patient presents today with 2 week history of pain and stiffness in her cervical spine. At times the pain is severe. She reports decreasing range of motion. Pain is worse with forward flexion and lateral rotation of her head. She denies any injuries or car accidents. She denies any falls. She denies any numbness or tingling radiating down either arm. She denies any weakness in either arm. Pain follows the path of the trapezius muscle from the midthoracic spine up to the cervical spine and the base of the occiput and down to each shoulder bilaterally. She is also tender to palpation along the cervical paraspinal muscles. She is also overdue for fasting lipid panel to reassess her triglycerides on the fenofibrate as well as regular blood work to monitor her chronic medical conditions.  Past Medical History  Diagnosis Date  . Back pain     h/o DDD since 2010  . Arthritis     ?OA vs Rheum (established with Dr. Jefm Bryant pending blood work)  . GERD (gastroesophageal reflux disease)   . Hyperlipidemia     no current med.  . Hypothyroid   . Von Willebrand disease ([place])   . Headache(784.0)     occasional  . Memory impairment   . History of skin cancer   . Articular cartilage disorder of shoulder 04/2012    right  . Rotator cuff rupture 04/2012    right  . Impingement syndrome of right  shoulder 04/2012  . Diabetes mellitus     NIDDM  . Anxiety   . Dental crowns present     also dental caps  . Right rotator cuff tear 05/15/2012  . Hypertension   . Hyperthyroidism    Past Surgical History  Procedure Laterality Date  . Tubal ligation    . Breast surgery  hematoma evacuation due to trauma  . Cardiac catheterization  03/06/2001  . Colonoscopy  01/20/2006 per patient  . Appendectomy    . Orif wrist fracture      left  . Lumbar laminectomy/decompression microdiscectomy  03/01/2009    right L4-5; arthrodesis L4-5  . Shoulder arthroscopy with rotator cuff repair and subacromial decompression  05/15/2012    Procedure: SHOULDER ARTHROSCOPY WITH ROTATOR CUFF REPAIR AND SUBACROMIAL DECOMPRESSION;  Surgeon: Johnny Bridge, MD;  Location: [place];  Service: Orthopedics;  Laterality: Right;  RIGHT ARTHROSCOPY SHOULDER DEBRIDEMENT LIMITED, ARTHROSCOPY SHOULDER DECOMPRESSION SUBACROMIAL PARTIAL ACROMIOPLASTY WITH CORACOACROMIAL RELEASE, ARTHROSCOPY SHOULDER WITH ROTATOR CUFF REPAIR  . Knee arthroscopy Right 12/01/2014    Procedure: ARTHROSCOPY KNEE,partial medial menisectomy and plica excision;  Surgeon: Hessie Knows, MD;  Location: [facility];  Service: Orthopedics;  Laterality: Right;  . Fracture surgery    . Cesarean section      x 2   Current Outpatient Prescriptions on File Prior to Visit  Medication Sig Dispense Refill  . cyclobenzaprine (FLEXERIL) 10 MG tablet TAKE 1/2 TO 1 TABLET BY MOUTH 3 TIMES A DAY AS NEEDED 40 tablet 0  . donepezil (ARICEPT) 10 MG tablet Take 1 tablet (10 mg total) by mouth at bedtime. 30 tablet 11  . fenofibrate 160 MG tablet Take 1 tablet (160 mg total) by mouth daily. 30 tablet 3  . HYDROcodone-acetaminophen (NORCO) 5-325 MG per tablet Take 1-2 tablets by mouth every 6 (six) hours as needed for moderate pain. 40 tablet 0  . hydroxychloroquine (PLAQUENIL) 200 MG tablet Take 200 mg by mouth daily.    Marland Kitchen levothyroxine (SYNTHROID,  LEVOTHROID) 88 MCG tablet TAKE 1 TABLET BY MOUTH ONCE A DAY 30 tablet 2  . lisinopril (ZESTRIL) 2.5 MG tablet Take 1 tablet (2.5 mg total) by mouth daily. 90 tablet 3  . LORazepam (ATIVAN) 0.5 MG tablet TAKE 1 TABLET BY MOUTH 3 TIMES A DAY AS NEEDED FOR ANXIETY 30 tablet 0  . meloxicam (MOBIC) 15 MG tablet Take 1 tablet (15 mg total) by mouth daily. (Patient taking differently: Take 15 mg by mouth as needed. )    . metFORMIN (GLUCOPHAGE-XR) 500 MG 24 hr tablet TAKE 1 TABLET BY MOUTH TWICE A DAY WITH MEALS 60 tablet 11  . metoprolol tartrate (LOPRESSOR) 25 MG tablet TAKE 1 TABLET BY MOUTH TWICE A DAY 60 tablet 5  . omeprazole (PRILOSEC) 40 MG capsule Take 40 mg by mouth daily.    Marland Kitchen omeprazole (PRILOSEC) 40 MG capsule TAKE 1 CAPSULE BY MOUTH ONCE DAILY 30 capsule 11  . oxyCODONE-acetaminophen (PERCOCET/ROXICET) 5-325 MG tablet TAKE 1/2 TO 1 TABLET BY MOUTH EVERY 8 HOURS AS NEEDED FOR PAIN (LIMITUSE AS MUCH AS POSSIBLE) 30 tablet 0  . pantoprazole (PROTONIX) 40 MG tablet Take 40 mg by mouth every morning.     . zolpidem (AMBIEN) 10 MG tablet TAKE 1 TABLET BY MOUTH EVERY NIGHT AT BEDTIME 30 tablet 2   No current facility-administered medications on file prior to visit.   Allergies  Allergen Reactions  . Tramadol Other (See Comments)    CHANGES IN MEMORY   Social History   Social History  . Marital Status: Married    Spouse Name: Trilby Drummer  . Number of Children: 2  . Years of Education: 12   Occupational History  .      retired   Social History Main Topics  . Smoking status: Never Smoker   . Smokeless tobacco: Never Used  .  Alcohol Use: No  . Drug Use: No  . Sexual Activity: Not Currently    Birth Control/ Protection: Surgical   Other Topics Concern  . Not on file   Social History Narrative   Patient lives at home with her husband Trilby Drummer).   Patient is retired .   Education high school.   Right handed.   Caffeine sweet tea two cups.         Family History  Problem  Relation Age of Onset  . Dementia Mother   . Arthritis Mother   . Cancer Father     unknown primary  . Heart disease Father   . Thyroid disease Sister   . Cancer Sister     brain tumor  . Dementia Sister   . COPD Brother   . Cancer Brother     bone cancer  . Colon cancer Paternal Grandfather   . Breast cancer Neg Hx       Review of Systems  All other systems reviewed and are negative.      Objective:   Physical Exam  Constitutional: She is oriented to person, place, and time. She appears well-developed and well-nourished.  Neck: Neck supple. No JVD present. No thyromegaly present.  Cardiovascular: Normal rate, regular rhythm, normal heart sounds and intact distal pulses.  Exam reveals no gallop and no friction rub.   No murmur heard. Pulmonary/Chest: Effort normal and breath sounds normal. No respiratory distress. She has no wheezes. She has no rales. She exhibits no tenderness.  Abdominal: Soft. Bowel sounds are normal. She exhibits no distension and no mass. There is no tenderness. There is no rebound and no guarding.  Musculoskeletal: She exhibits no edema.       Cervical back: She exhibits decreased range of motion, tenderness, pain and spasm. She exhibits no swelling and no edema.  Lymphadenopathy:    She has no cervical adenopathy.  Neurological: She is alert and oriented to person, place, and time. She has normal reflexes.  Reflex Scores:      Tricep reflexes are 2+ on the right side and 2+ on the left side.      Bicep reflexes are 2+ on the right side and 2+ on the left side.      Brachioradialis reflexes are 2+ on the right side and 2+ on the left side.         Assessment & Plan:  Neck pain, acute - Plan: DG Cervical Spine Complete, predniSONE (DELTASONE) 20 MG tablet  Type 2 diabetes mellitus without complication, without long-term current use of insulin (HCC) - Plan: Hemoglobin A1c  Hypothyroidism, unspecified hypothyroidism type - Plan: TSH  Memory  change  Midline low back pain, with sciatica presence unspecified  HLD (hyperlipidemia) - Plan: CBC with Differential/Platelet, COMPLETE METABOLIC PANEL WITH GFR, Lipid panel  I believe the pain in her neck are likely muscle spasms in the trapezius muscle or a cervical muscle strain. I will obtain a cervical spine x-ray to evaluate for degenerative disc disease or severe arthritis. Meanwhile I will start the patient on a prednisone taper pack as she has failed outpatient therapy with meloxicam. She can also use Flexeril 5 mg every 8 hours as needed for muscle spasms. I did caution the patient that this will exacerbate confusion and memory issues but this is hopefully going to be short-term and assist with the pain. I would like her to return fasting at her earliest convenience to check a CMP, fasting lipid panel, and TSH.  Patient has moderate dementia. This is obvious today on the exam. She is unable to answer any question without asking her husband for help.  She is on Aricept. Once her back pain is better, I would recommend adding Namenda to try to  Delay any further memory loss. Again I would also caution the patient against long term use of pain medication and muscle relaxers and lorazepam and Ambien as all of these can potentially exacerbate her underlying problem.

## 2015-05-10 ENCOUNTER — Ambulatory Visit
Admission: RE | Admit: 2015-05-10 | Discharge: 2015-05-10 | Disposition: A | Discharge status: Home / Self Care | Payer: PPO | Source: Ambulatory Visit | Attending: Family Medicine | Admitting: Family Medicine

## 2015-05-10 DIAGNOSIS — M542 Cervicalgia: Secondary | ICD-10-CM

## 2015-05-12 ENCOUNTER — Other Ambulatory Visit: Payer: Self-pay | Admitting: Family Medicine

## 2015-05-12 NOTE — Telephone Encounter (Signed)
ok 

## 2015-05-12 NOTE — Telephone Encounter (Signed)
Ok to refill??  Last office visit 05/09/2015.  Last refill 02/23/2015.

## 2015-05-18 ENCOUNTER — Ambulatory Visit (INDEPENDENT_AMBULATORY_CARE_PROVIDER_SITE_OTHER): Payer: PPO | Admitting: Family Medicine

## 2015-05-18 ENCOUNTER — Other Ambulatory Visit: Payer: PPO

## 2015-05-18 DIAGNOSIS — Z23 Encounter for immunization: Secondary | ICD-10-CM

## 2015-05-18 LAB — CBC WITH DIFFERENTIAL/PLATELET
Basophils Absolute: 0 10*3/uL (ref 0.0–0.1)
Basophils Relative: 0 % (ref 0–1)
Eosinophils Absolute: 0.1 10*3/uL (ref 0.0–0.7)
Eosinophils Relative: 2 % (ref 0–5)
HCT: 33.8 % — ABNORMAL LOW (ref 36.0–46.0)
Hemoglobin: 11.2 g/dL — ABNORMAL LOW (ref 12.0–15.0)
Lymphocytes Relative: 36 % (ref 12–46)
Lymphs Abs: 2.3 10*3/uL (ref 0.7–4.0)
MCH: 26.8 pg (ref 26.0–34.0)
MCHC: 33.1 g/dL (ref 30.0–36.0)
MCV: 80.9 fL (ref 78.0–100.0)
MPV: 9.8 fL (ref 8.6–12.4)
Monocytes Absolute: 0.4 10*3/uL (ref 0.1–1.0)
Monocytes Relative: 6 % (ref 3–12)
Neutro Abs: 3.6 10*3/uL (ref 1.7–7.7)
Neutrophils Relative %: 56 % (ref 43–77)
Platelets: 251 10*3/uL (ref 150–400)
RBC: 4.18 MIL/uL (ref 3.87–5.11)
RDW: 15.1 % (ref 11.5–15.5)
WBC: 6.5 10*3/uL (ref 4.0–10.5)

## 2015-05-18 LAB — LIPID PANEL
Cholesterol: 234 mg/dL — ABNORMAL HIGH (ref 125–200)
HDL: 57 mg/dL (ref >=46)
LDL Cholesterol: 137 mg/dL — ABNORMAL HIGH (ref <130)
Total CHOL/HDL Ratio: 4.1 Ratio (ref <=5.0)
Triglycerides: 199 mg/dL — ABNORMAL HIGH (ref <150)
VLDL: 40 mg/dL — ABNORMAL HIGH (ref <30)

## 2015-05-18 LAB — COMPLETE METABOLIC PANEL WITH GFR
ALT: 14 U/L (ref 6–29)
AST: 21 U/L (ref 10–35)
Albumin: 4 g/dL (ref 3.6–5.1)
Alkaline Phosphatase: 33 U/L (ref 33–130)
BUN: 20 mg/dL (ref 7–25)
CO2: 25 mmol/L (ref 20–31)
Calcium: 9 mg/dL (ref 8.6–10.4)
Chloride: 103 mmol/L (ref 98–110)
Creat: 1.18 mg/dL — ABNORMAL HIGH (ref 0.50–0.99)
GFR, Est African American: 55 mL/min — ABNORMAL LOW (ref >=60)
GFR, Est Non African American: 48 mL/min — ABNORMAL LOW (ref >=60)
Glucose, Bld: 78 mg/dL (ref 70–99)
Potassium: 4.4 mmol/L (ref 3.5–5.3)
Sodium: 138 mmol/L (ref 135–146)
Total Bilirubin: 0.3 mg/dL (ref 0.2–1.2)
Total Protein: 6.3 g/dL (ref 6.1–8.1)

## 2015-05-18 LAB — HEMOGLOBIN A1C
Hgb A1c MFr Bld: 6.4 % — ABNORMAL HIGH (ref <5.7)
Mean Plasma Glucose: 137 mg/dL — ABNORMAL HIGH (ref <117)

## 2015-05-18 LAB — TSH: TSH: 7.032 u[IU]/mL — ABNORMAL HIGH (ref 0.350–4.500)

## 2015-05-19 ENCOUNTER — Other Ambulatory Visit: Payer: Self-pay | Admitting: Family Medicine

## 2015-05-19 DIAGNOSIS — E039 Hypothyroidism, unspecified: Secondary | ICD-10-CM

## 2015-05-19 MED ORDER — LEVOTHYROXINE SODIUM 100 MCG PO TABS: 100.0000 ug | ORAL_TABLET | Freq: Every day | ORAL | Status: DC

## 2015-07-04 ENCOUNTER — Other Ambulatory Visit: Payer: Self-pay | Admitting: Family Medicine

## 2015-07-04 NOTE — Telephone Encounter (Signed)
ok 

## 2015-07-04 NOTE — Telephone Encounter (Signed)
Ok to refill 

## 2015-07-28 ENCOUNTER — Encounter: Payer: Self-pay | Admitting: Neurology

## 2015-08-23 ENCOUNTER — Other Ambulatory Visit: Payer: Self-pay | Admitting: Family Medicine

## 2015-08-23 NOTE — Telephone Encounter (Signed)
Ok to refill??  Last office visit 05/09/2015.  Last refill 05/12/2015.

## 2015-08-24 NOTE — Telephone Encounter (Signed)
ok 

## 2015-08-24 NOTE — Telephone Encounter (Signed)
Prescription printed and patient made aware to come to office to pick up after 2pm on 08/24/2015.

## 2015-09-05 ENCOUNTER — Ambulatory Visit: Payer: PPO | Admitting: Neurology

## 2015-11-02 DIAGNOSIS — L309 Dermatitis, unspecified: Secondary | ICD-10-CM | POA: Diagnosis not present

## 2015-11-02 DIAGNOSIS — E65 Localized adiposity: Secondary | ICD-10-CM | POA: Diagnosis not present

## 2015-11-02 DIAGNOSIS — Z85828 Personal history of other malignant neoplasm of skin: Secondary | ICD-10-CM | POA: Diagnosis not present

## 2015-11-02 DIAGNOSIS — L578 Other skin changes due to chronic exposure to nonionizing radiation: Secondary | ICD-10-CM | POA: Diagnosis not present

## 2015-12-06 DIAGNOSIS — L309 Dermatitis, unspecified: Secondary | ICD-10-CM | POA: Diagnosis not present

## 2015-12-25 ENCOUNTER — Encounter: Payer: Self-pay | Admitting: Gastroenterology

## 2016-01-01 DIAGNOSIS — M1990 Unspecified osteoarthritis, unspecified site: Secondary | ICD-10-CM | POA: Diagnosis not present

## 2016-01-01 DIAGNOSIS — M150 Primary generalized (osteo)arthritis: Secondary | ICD-10-CM | POA: Diagnosis not present

## 2016-01-01 DIAGNOSIS — M25562 Pain in left knee: Secondary | ICD-10-CM | POA: Diagnosis not present

## 2016-01-03 ENCOUNTER — Other Ambulatory Visit: Payer: Self-pay | Admitting: Family Medicine

## 2016-01-03 MED ORDER — DONEPEZIL HCL 10 MG PO TABS: 10.0000 mg | ORAL_TABLET | Freq: Every day | ORAL | 11 refills | Status: DC

## 2016-01-03 NOTE — Telephone Encounter (Signed)
Medication called/sent to requested pharmacy  

## 2016-01-17 ENCOUNTER — Other Ambulatory Visit: Payer: Self-pay | Admitting: Family Medicine

## 2016-01-17 NOTE — Telephone Encounter (Signed)
Ok to refill 

## 2016-01-18 ENCOUNTER — Other Ambulatory Visit: Payer: Self-pay | Admitting: Family Medicine

## 2016-01-18 NOTE — Telephone Encounter (Signed)
ok 

## 2016-01-18 NOTE — Telephone Encounter (Signed)
Refill appropriate and filled per protocol. 

## 2016-01-18 NOTE — Telephone Encounter (Signed)
Prescription sent to pharmacy.

## 2016-01-22 ENCOUNTER — Other Ambulatory Visit: Payer: Self-pay | Admitting: Family Medicine

## 2016-02-20 DIAGNOSIS — M1990 Unspecified osteoarthritis, unspecified site: Secondary | ICD-10-CM | POA: Diagnosis not present

## 2016-02-20 DIAGNOSIS — M150 Primary generalized (osteo)arthritis: Secondary | ICD-10-CM | POA: Diagnosis not present

## 2016-03-21 ENCOUNTER — Other Ambulatory Visit: Payer: Self-pay | Admitting: Family Medicine

## 2016-04-13 ENCOUNTER — Other Ambulatory Visit: Payer: Self-pay | Admitting: Family Medicine

## 2016-05-03 ENCOUNTER — Telehealth: Payer: Self-pay | Admitting: Family Medicine

## 2016-05-03 NOTE — Telephone Encounter (Signed)
Requesting a refill on Ambien - Ok to refill??      LRF 11/23/2014

## 2016-05-06 MED ORDER — ZOLPIDEM TARTRATE 10 MG PO TABS
10.0000 mg | ORAL_TABLET | Freq: Every day | ORAL | 2 refills | Status: DC
Start: 2016-05-06 — End: 2017-01-13

## 2016-05-06 NOTE — Telephone Encounter (Signed)
ok 

## 2016-05-06 NOTE — Telephone Encounter (Signed)
Medication called/sent to requested pharmacy  

## 2016-08-29 ENCOUNTER — Ambulatory Visit: Payer: PPO | Admitting: Family Medicine

## 2016-09-03 ENCOUNTER — Encounter: Payer: Self-pay | Admitting: Family Medicine

## 2016-09-03 ENCOUNTER — Ambulatory Visit (INDEPENDENT_AMBULATORY_CARE_PROVIDER_SITE_OTHER): Payer: PPO | Admitting: Family Medicine

## 2016-09-03 VITALS — BP 142/92 | HR 95 | Temp 97.9°F | Resp 16 | Wt 194.0 lb

## 2016-09-03 DIAGNOSIS — R079 Chest pain, unspecified: Secondary | ICD-10-CM | POA: Diagnosis not present

## 2016-09-03 DIAGNOSIS — R0789 Other chest pain: Secondary | ICD-10-CM

## 2016-09-03 NOTE — Progress Notes (Signed)
Subjective:    Patient ID: Stacy Davis, female    DOB: June 29, 1945, 71 y.o.   MRN: [id number]  HPI   09/03/16 Patient has not been seen in more than a year.  She is long overdue for fasting lab work to monitor her diabetes hyperlipidemia, and hypothyroidism.  History is very difficult to obtain the patient's memory loss. However her husband states that a week ago she was having chest pain almost on a daily basis. It was substernal in location and would radiate into her shoulder blade. He also reports that she was planning shortness of breath. She is unable to expel him history at all. With every question, she looks to him to answer. She states that she feels fine right now. Her blood pressure slightly elevated. She has not been checking her blood sugars and therefore we do not know how well controlled her diabetes has been. She is unable to provide a time frame for the chest pain but her husband seems to indicate that the pain lasted several minutes it would subside gradually. He states that it was not brought on by any activity and will occur and the patient was lying down or even when she was sitting. He is questioning if it could be acid reflux which is certainly a possibility. I obtained an EKG today which revealed Q waves in the inferior leads however this is a chronic finding and dates back to her EKG from 2013. There are nonspecific ST changes in the lateral leads but this is consistent with her previous EKGs    Past Medical History:  Diagnosis Date  . Anxiety   . Arthritis    ?OA vs Rheum (established with Dr. Jefm Bryant pending blood work)  . Articular cartilage disorder of shoulder 04/2012   right  . Back pain    h/o DDD since 2010  . Dental crowns present    also dental caps  . Diabetes mellitus    NIDDM  . GERD (gastroesophageal reflux disease)   . Headache(784.0)    occasional  . History of skin cancer   . Hyperlipidemia    no current med.  . Hypertension   .  Hyperthyroidism   . Hypothyroid   . Impingement syndrome of right shoulder 04/2012  . Memory impairment   . Right rotator cuff tear 05/15/2012  . Rotator cuff rupture 04/2012   right  . Von Willebrand disease ([place])    Past Surgical History:  Procedure Laterality Date  . APPENDECTOMY    . BREAST SURGERY     hematoma evacuation due to trauma  . CARDIAC CATHETERIZATION  03/06/2001  . CESAREAN SECTION     x 2  . COLONOSCOPY  01/20/2006 per patient  . FRACTURE SURGERY    . KNEE ARTHROSCOPY Right 12/01/2014   Procedure: ARTHROSCOPY KNEE,partial medial menisectomy and plica excision;  Surgeon: Hessie Knows, MD;  Location: [facility];  Service: Orthopedics;  Laterality: Right;  . LUMBAR LAMINECTOMY/DECOMPRESSION MICRODISCECTOMY  03/01/2009   right L4-5; arthrodesis L4-5  . ORIF WRIST FRACTURE     left  . SHOULDER ARTHROSCOPY WITH ROTATOR CUFF REPAIR AND SUBACROMIAL DECOMPRESSION  05/15/2012   Procedure: SHOULDER ARTHROSCOPY WITH ROTATOR CUFF REPAIR AND SUBACROMIAL DECOMPRESSION;  Surgeon: Johnny Bridge, MD;  Location: [place];  Service: Orthopedics;  Laterality: Right;  RIGHT ARTHROSCOPY SHOULDER DEBRIDEMENT LIMITED, ARTHROSCOPY SHOULDER DECOMPRESSION SUBACROMIAL PARTIAL ACROMIOPLASTY WITH CORACOACROMIAL RELEASE, ARTHROSCOPY SHOULDER WITH ROTATOR CUFF REPAIR  . TUBAL LIGATION     Current  Outpatient Prescriptions on File Prior to Visit  Medication Sig Dispense Refill  . cyclobenzaprine (FLEXERIL) 10 MG tablet TAKE 1/2 TO 1 TABLET BY MOUTH 3 TIMES A DAY AS NEEDED 40 tablet 0  . fenofibrate 160 MG tablet Take 1 tablet (160 mg total) by mouth daily. 30 tablet 3  . HYDROcodone-acetaminophen (NORCO) 5-325 MG per tablet Take 1-2 tablets by mouth every 6 (six) hours as needed for moderate pain. 40 tablet 0  . hydroxychloroquine (PLAQUENIL) 200 MG tablet Take 200 mg by mouth daily.    Marland Kitchen levothyroxine (SYNTHROID, LEVOTHROID) 100 MCG tablet Take 1 tablet (100 mcg total) by mouth daily.  30 tablet 1  . lisinopril (PRINIVIL,ZESTRIL) 2.5 MG tablet TAKE 1 TABLET BY MOUTH ONCE A DAY 90 tablet 0  . lisinopril (PRINIVIL,ZESTRIL) 2.5 MG tablet TAKE 1 TABLET BY MOUTH ONCE A DAY 30 tablet 0  . lisinopril (PRINIVIL,ZESTRIL) 2.5 MG tablet TAKE 1 TABLET BY MOUTH ONCE A DAY 30 tablet 3  . LORazepam (ATIVAN) 0.5 MG tablet TAKE 1 TABLET BY MOUTH 3 TIMES A DAY AS NEEDED FOR ANXIETY 30 tablet 0  . meloxicam (MOBIC) 15 MG tablet Take 1 tablet (15 mg total) by mouth daily. (Patient taking differently: Take 15 mg by mouth as needed. )    . metFORMIN (GLUCOPHAGE-XR) 500 MG 24 hr tablet TAKE 1 TABLET BY MOUTH TWICE A DAY WITH MEALS 60 tablet 11  . metoprolol tartrate (LOPRESSOR) 25 MG tablet TAKE 1 TABLET BY MOUTH TWICE A DAY 60 tablet 5  . omeprazole (PRILOSEC) 40 MG capsule Take 40 mg by mouth daily.    Marland Kitchen oxyCODONE-acetaminophen (PERCOCET/ROXICET) 5-325 MG tablet TAKE 1/2 TO 1 TABLET BY MOUTH EVERY 8 HOURS AS NEEDED FOR PAIN (LIMITUSE AS MUCH AS POSSIBLE) 30 tablet 0  . pantoprazole (PROTONIX) 40 MG tablet Take 40 mg by mouth every morning.     . zolpidem (AMBIEN) 10 MG tablet Take 1 tablet (10 mg total) by mouth at bedtime. 30 tablet 2   No current facility-administered medications on file prior to visit.    Allergies  Allergen Reactions  . Tramadol Other (See Comments)    CHANGES IN MEMORY   Social History   Social History  . Marital status: Married    Spouse name: Trilby Drummer  . Number of children: 2  . Years of education: 12   Occupational History  .      retired   Social History Main Topics  . Smoking status: Never Smoker  . Smokeless tobacco: Never Used  . Alcohol use No  . Drug use: No  . Sexual activity: Not Currently    Birth control/ protection: Surgical   Other Topics Concern  . Not on file   Social History Narrative   Patient lives at home with her husband Trilby Drummer).   Patient is retired .   Education high school.   Right handed.   Caffeine sweet tea two cups.           Family History  Problem Relation Age of Onset  . Dementia Mother   . Arthritis Mother   . Cancer Father     unknown primary  . Heart disease Father   . Thyroid disease Sister   . Cancer Sister     brain tumor  . Dementia Sister   . COPD Brother   . Cancer Brother     bone cancer  . Colon cancer Paternal Grandfather   . Breast cancer Neg Hx  Review of Systems  Neurological: Positive for dizziness.  All other systems reviewed and are negative.      Objective:   Physical Exam  Constitutional: She is oriented to person, place, and time. She appears well-developed and well-nourished.  Neck: Neck supple. No JVD present. No thyromegaly present.  Cardiovascular: Normal rate, regular rhythm, normal heart sounds and intact distal pulses.  Exam reveals no gallop and no friction rub.   No murmur heard. Pulmonary/Chest: Effort normal and breath sounds normal. No respiratory distress. She has no wheezes. She has no rales. She exhibits no tenderness.  Abdominal: Soft. Bowel sounds are normal. She exhibits no distension and no mass. There is no tenderness. There is no rebound and no guarding.  Musculoskeletal: She exhibits no edema.  Lymphadenopathy:    She has no cervical adenopathy.  Neurological: She is alert and oriented to person, place, and time. She has normal reflexes.          Assessment & Plan:  Chest pain, unspecified type - Plan: EKG 12-Lead  Atypical chest pain - Plan: CBC with Differential/Platelet, COMPLETE METABOLIC PANEL WITH GFR, Hemoglobin A1c, TSH  Chest pain is atypical however it is extremely difficult history to obtain. Patient has too many risk factors for cardiovascular disease including hypertension, hyperlipidemia, and diabetes mellitus type 2 with inadequate control.  More than likely, the patient's chest pain was gastrointestinal in nature however I would recommend a cardiology referral for an outpatient stress test given her numerous risk  factors. I am not comfortable attributiing chest pain substernal location with radiation into the left shoulder blade and with shortness of breath as just simply reflux in a 71 year old with diabetes and hypertension and hyperlipidemia.  I feel she would benefit from more substantial workup. Compliance is an issue with this patient and therefore even though she's not fasting I will obtain lab work including a CBC, CMP, A1c, and direct LDL. I will also check a TSH to ensure adequate management of her hypothyroidism

## 2016-09-04 LAB — CBC WITH DIFFERENTIAL/PLATELET
Basophils Absolute: 0 cells/uL (ref 0–200)
Basophils Relative: 0 %
Eosinophils Absolute: 84 cells/uL (ref 15–500)
Eosinophils Relative: 1 %
HCT: 38.2 % (ref 35.0–45.0)
Hemoglobin: 12.7 g/dL (ref 12.0–15.0)
Lymphocytes Relative: 25 %
Lymphs Abs: 2100 cells/uL (ref 850–3900)
MCH: 26.8 pg — ABNORMAL LOW (ref 27.0–33.0)
MCHC: 33.2 g/dL (ref 32.0–36.0)
MCV: 80.6 fL (ref 80.0–100.0)
MPV: 10.3 fL (ref 7.5–12.5)
Monocytes Absolute: 588 cells/uL (ref 200–950)
Monocytes Relative: 7 %
Neutro Abs: 5628 cells/uL (ref 1500–7800)
Neutrophils Relative %: 67 %
Platelets: 242 10*3/uL (ref 140–400)
RBC: 4.74 MIL/uL (ref 3.80–5.10)
RDW: 15.9 % — ABNORMAL HIGH (ref 11.0–15.0)
WBC: 8.4 10*3/uL (ref 3.8–10.8)

## 2016-09-04 LAB — HEMOGLOBIN A1C
Hgb A1c MFr Bld: 6.2 % — ABNORMAL HIGH (ref <5.7)
Mean Plasma Glucose: 131 mg/dL

## 2016-09-04 LAB — COMPLETE METABOLIC PANEL WITH GFR
ALT: 14 U/L (ref 6–29)
AST: 22 U/L (ref 10–35)
Albumin: 4.4 g/dL (ref 3.6–5.1)
Alkaline Phosphatase: 67 U/L (ref 33–130)
BUN: 17 mg/dL (ref 7–25)
CO2: 24 mmol/L (ref 20–31)
Calcium: 9.4 mg/dL (ref 8.6–10.4)
Chloride: 103 mmol/L (ref 98–110)
Creat: 0.9 mg/dL (ref 0.50–0.99)
GFR, Est African American: 75 mL/min (ref >=60)
GFR, Est Non African American: 65 mL/min (ref >=60)
Glucose, Bld: 124 mg/dL — ABNORMAL HIGH (ref 70–99)
Potassium: 3.8 mmol/L (ref 3.5–5.3)
Sodium: 140 mmol/L (ref 135–146)
Total Bilirubin: 0.4 mg/dL (ref 0.2–1.2)
Total Protein: 6.8 g/dL (ref 6.1–8.1)

## 2016-09-04 LAB — TSH: TSH: 8.18 mIU/L — ABNORMAL HIGH

## 2016-09-06 ENCOUNTER — Other Ambulatory Visit: Payer: Self-pay | Admitting: Family Medicine

## 2016-09-06 DIAGNOSIS — E038 Other specified hypothyroidism: Secondary | ICD-10-CM

## 2016-11-08 ENCOUNTER — Other Ambulatory Visit: Payer: PPO

## 2016-11-08 DIAGNOSIS — E038 Other specified hypothyroidism: Secondary | ICD-10-CM | POA: Diagnosis not present

## 2016-11-08 LAB — TSH: TSH: 12.27 mIU/L — ABNORMAL HIGH

## 2017-01-13 ENCOUNTER — Encounter: Payer: Self-pay | Admitting: Family Medicine

## 2017-01-13 ENCOUNTER — Other Ambulatory Visit: Payer: Self-pay | Admitting: Family Medicine

## 2017-01-13 DIAGNOSIS — R413 Other amnesia: Secondary | ICD-10-CM | POA: Insufficient documentation

## 2017-01-15 ENCOUNTER — Other Ambulatory Visit: Payer: Self-pay | Admitting: Family Medicine

## 2017-08-27 ENCOUNTER — Ambulatory Visit (INDEPENDENT_AMBULATORY_CARE_PROVIDER_SITE_OTHER): Payer: PPO | Admitting: Physician Assistant

## 2017-08-27 ENCOUNTER — Encounter: Payer: Self-pay | Admitting: Physician Assistant

## 2017-08-27 VITALS — BP 132/88 | HR 95 | Temp 98.3°F | Resp 14 | Ht 65.0 in | Wt 166.2 lb

## 2017-08-27 DIAGNOSIS — F320 Major depressive disorder, single episode, mild: Secondary | ICD-10-CM | POA: Diagnosis not present

## 2017-08-27 DIAGNOSIS — B372 Candidiasis of skin and nail: Secondary | ICD-10-CM | POA: Diagnosis not present

## 2017-08-27 MED ORDER — NYSTATIN 100000 UNIT/GM EX POWD: Freq: Four times a day (QID) | CUTANEOUS | 0 refills | Status: DC

## 2017-08-27 MED ORDER — NYSTATIN 100000 UNIT/GM EX CREA: 1.0000 "application " | TOPICAL_CREAM | Freq: Two times a day (BID) | CUTANEOUS | 0 refills | Status: DC

## 2017-08-27 MED ORDER — SERTRALINE HCL 50 MG PO TABS: 50.0000 mg | ORAL_TABLET | Freq: Every day | ORAL | 2 refills | Status: DC

## 2017-08-27 NOTE — Progress Notes (Signed)
Patient ID: Stacy Davis MRN: [id number], DOB: 23-Mar-1946, 72 y.o. Date of Encounter: @DATE @  Chief Complaint:  Chief Complaint  Patient presents with  . Depression    HPI: 72 y.o. year old female  presents with above.   Husband accompanies her for visit. Reports that they both see Dr. Dennard Schaumann as their PCP and really like him but patient was feeling that she might be more comfortable with a female to discuss her current issues.  Therefore scheduled today's visit with me.  Stacy Davis does add a few comments but her husband provides most of the history and information.  He says that "she has a memory problem that she has had for about 10 years and that she has some Alzheimer's medicine but she quit the medicine."  Says that they saw a doctor at neurology that discussed putting her into a research study etc. and that they "just weren't into all of that "and that she quit the medicine and has not followed up regarding that.  Also states that she has been depressed and cries a lot and is scared a lot.  Says that all of this seems to be getting worse.   Asked if she had ever been on any medication for these kinds of symptoms in the past.   Says that she was on one medication but that was many years ago and she has no idea what medicine it was.  Husband says that he has just been concerned because this is all getting worse.  Husband also says that she has "raw areas on the underside of her" abdomen.  Says that this is also bothering her.  No other specific concerns to address today.  At the end of the visit I told them that I was reviewing her chart to see when the last labs were checked etc. and saw that thyroid medicine is on her list.  Reviewed that her last TSH the result note states that patient had not been taking the medicine daily and they wanted the husband to use a pillbox and to visually watch patient take the pill to know that she was getting this daily.   Today when I  discussed this, husband seems to know nothing about that issue and patient states that she is not taking the thyroid medication daily.  I discussed the importance of thyroid and listed off multiple areas of the body that thyroid specifically effects and explained that thyroid effects entire body and could be contributing to her current symptoms and that she definitely needs to make sure to take the thyroid medication every single day.   Past Medical History:  Diagnosis Date  . Anxiety   . Arthritis    ?OA vs Rheum (established with Dr. Jefm Bryant pending blood work)  . Articular cartilage disorder of shoulder 04/2012   right  . Back pain    h/o DDD since 2010  . Dental crowns present    also dental caps  . Diabetes mellitus    NIDDM  . GERD (gastroesophageal reflux disease)   . Headache(784.0)    occasional  . History of skin cancer   . Hyperlipidemia    no current med.  . Hypertension   . Hyperthyroidism   . Hypothyroid   . Impingement syndrome of right shoulder 04/2012  . Memory impairment    dementia- worsenign 12/2016 with medication non compliance and delusions.    . Right rotator cuff tear 05/15/2012  . Rotator cuff rupture 04/2012  right  . Von Willebrand disease ([place])      Home Meds: Outpatient Medications Prior to Visit  Medication Sig Dispense Refill  . fenofibrate 160 MG tablet Take 1 tablet (160 mg total) by mouth daily. 30 tablet 3  . hydroxychloroquine (PLAQUENIL) 200 MG tablet Take 200 mg by mouth daily.    Marland Kitchen levothyroxine (SYNTHROID, LEVOTHROID) 100 MCG tablet Take 1 tablet (100 mcg total) by mouth daily. 30 tablet 1  . levothyroxine (SYNTHROID, LEVOTHROID) 88 MCG tablet TAKE 1 TABLET BY MOUTH ONCE A DAY 30 tablet 3  . lisinopril (PRINIVIL,ZESTRIL) 2.5 MG tablet TAKE 1 TABLET BY MOUTH ONCE A DAY 90 tablet 0  . meloxicam (MOBIC) 15 MG tablet Take 1 tablet (15 mg total) by mouth daily. (Patient taking differently: Take 15 mg by mouth as needed. )    .  metFORMIN (GLUCOPHAGE-XR) 500 MG 24 hr tablet TAKE 1 TABLET BY MOUTH TWICE A DAY WITH MEALS 60 tablet 11  . metoprolol tartrate (LOPRESSOR) 25 MG tablet TAKE 1 TABLET BY MOUTH TWICE A DAY 60 tablet 5  . pantoprazole (PROTONIX) 40 MG tablet Take 40 mg by mouth every morning.     Marland Kitchen levothyroxine (SYNTHROID, LEVOTHROID) 88 MCG tablet TAKE 1 TABLET BY MOUTH ONCE A DAY 30 tablet 3  . lisinopril (PRINIVIL,ZESTRIL) 2.5 MG tablet TAKE 1 TABLET BY MOUTH ONCE A DAY 30 tablet 0  . lisinopril (PRINIVIL,ZESTRIL) 2.5 MG tablet TAKE 1 TABLET BY MOUTH ONCE A DAY 30 tablet 3   No facility-administered medications prior to visit.     Allergies:  Allergies  Allergen Reactions  . Tramadol Other (See Comments)    CHANGES IN MEMORY    Social History   Socioeconomic History  . Marital status: Married    Spouse name: Trilby Drummer  . Number of children: 2  . Years of education: 67  . Highest education level: Not on file  Occupational History    Comment: retired  Scientific laboratory technician  . Financial resource strain: Not on file  . Food insecurity:    Worry: Not on file    Inability: Not on file  . Transportation needs:    Medical: Not on file    Non-medical: Not on file  Tobacco Use  . Smoking status: Never Smoker  . Smokeless tobacco: Never Used  Substance and Sexual Activity  . Alcohol use: No    Alcohol/week: 0.0 oz  . Drug use: No  . Sexual activity: Not Currently    Birth control/protection: Surgical  Lifestyle  . Physical activity:    Days per week: Not on file    Minutes per session: Not on file  . Stress: Not on file  Relationships  . Social connections:    Talks on phone: Not on file    Gets together: Not on file    Attends religious service: Not on file    Active member of club or organization: Not on file    Attends meetings of clubs or organizations: Not on file    Relationship status: Not on file  . Intimate partner violence:    Fear of current or ex partner: Not on file    Emotionally  abused: Not on file    Physically abused: Not on file    Forced sexual activity: Not on file  Other Topics Concern  . Not on file  Social History Narrative   Patient lives at home with her husband Trilby Drummer).   Patient is retired .   Education high  school.   Right handed.   Caffeine sweet tea two cups.       Family History  Problem Relation Age of Onset  . Dementia Mother   . Arthritis Mother   . Cancer Father        unknown primary  . Heart disease Father   . Thyroid disease Sister   . Cancer Sister        brain tumor  . Dementia Sister   . COPD Brother   . Cancer Brother        bone cancer  . Colon cancer Paternal Grandfather   . Breast cancer Neg Hx      Review of Systems:  See HPI for pertinent ROS. All other ROS negative.    Physical Exam: Blood pressure 132/88, pulse 95, temperature 98.3 F (36.8 C), temperature source Oral, resp. rate 14, height 5\' 5"  (1.651 m), weight 75.4 kg (166 lb 3.2 oz), SpO2 98 %., Body mass index is 27.66 kg/m. General: WF. Appears in no acute distress. Neck: Supple. No thyromegaly. No lymphadenopathy. Lungs: Clear bilaterally to auscultation without wheezes, rales, or rhonchi. Breathing is unlabored. Heart: RRR with S1 S2. No murmurs, rubs, or gallops. Musculoskeletal:  Strength and tone normal for age. Extremities/Skin: No LE edema.  She has panniculus.  In this skin fold, she has diffuse erythema. Neuro: Alert and oriented X 3. Moves all extremities spontaneously. Gait is normal. CNII-XII grossly in tact. Psych:  Responds to questions appropriately with a normal affect.     ASSESSMENT AND PLAN:  72 y.o. year old female with  1. Current mild episode of major depressive disorder without prior episode ([place]) She is to restart thyroid medication and we will recheck TSH at her follow-up visit in 6 weeks. Upper expectations of this medication with patient and husband.  Explained that if it is causing adverse effects then call us  immediately.  Otherwise even if she does not see improvement in symptoms and is not seeing positive effects from medicine she is to simply continue taking the medication until she has her follow-up visit in 6 weeks.  Gust that takes this type of medication time to get in her system and start working. - sertraline (ZOLOFT) 50 MG tablet; Take 1 tablet (50 mg total) by mouth daily.  Dispense: 30 tablet; Refill: 2  2. Cutaneous candidiasis I explained that moisture is the cause of this.  Whenever she takes shower or get sweaty she needs to make sure to dry this area very well and may even want to use a hair dryer or fan after using a towel.  Also discussed that she can apply either a cream or a powder so I will give her both to try which one works best for her. - nystatin (MYCOSTATIN/NYSTOP) powder; Apply topically 4 (four) times daily.  Dispense: 15 g; Refill: 0 - nystatin cream (MYCOSTATIN); Apply 1 application topically 2 (two) times daily.  Dispense: 30 g; Refill: 0  Hypothyroidism The HPI for details.  Today I discussed that it is extremely important for her to take her thyroid medication every single day. She is to take medication daily and recheck TSH at f/u OV in 6 weeks.   Follow up office visit 6 weeks or sooner if needed.  Marin Olp [place], [place], [facility] 08/27/2017 4:07 PM

## 2017-09-08 ENCOUNTER — Other Ambulatory Visit: Payer: Self-pay | Admitting: Family Medicine

## 2017-10-08 ENCOUNTER — Encounter: Payer: Self-pay | Admitting: Physician Assistant

## 2017-10-08 ENCOUNTER — Ambulatory Visit (INDEPENDENT_AMBULATORY_CARE_PROVIDER_SITE_OTHER): Payer: PPO | Admitting: Physician Assistant

## 2017-10-08 VITALS — BP 130/90 | HR 74 | Temp 98.2°F | Resp 16 | Ht 65.0 in | Wt 160.8 lb

## 2017-10-08 DIAGNOSIS — E039 Hypothyroidism, unspecified: Secondary | ICD-10-CM

## 2017-10-08 DIAGNOSIS — F320 Major depressive disorder, single episode, mild: Secondary | ICD-10-CM

## 2017-10-08 DIAGNOSIS — R739 Hyperglycemia, unspecified: Secondary | ICD-10-CM | POA: Diagnosis not present

## 2017-10-08 DIAGNOSIS — F329 Major depressive disorder, single episode, unspecified: Secondary | ICD-10-CM | POA: Insufficient documentation

## 2017-10-08 DIAGNOSIS — F32A Depression, unspecified: Secondary | ICD-10-CM | POA: Insufficient documentation

## 2017-10-08 NOTE — Progress Notes (Signed)
Patient ID: Stacy Davis MRN: [id number], DOB: [birth year], 72 y.o. Date of Encounter: @DATE @  Chief Complaint:  Chief Complaint  Patient presents with  . follow up with depression    HPI: 72 y.o. year old female  presents with above.     08/27/2017:  Husband accompanies her for visit. Reports that they both see Dr. Dennard Schaumann as their PCP and really like him but patient was feeling that she might be more comfortable with a female to discuss her current issues.  Therefore scheduled today's visit with me.  Stacy Davis does add a few comments but her husband provides most of the history and information.  He says that "she has a memory problem that she has had for about 10 years and that she has some Alzheimer's medicine but she quit the medicine."  Says that they saw a doctor at neurology that discussed putting her into a research study etc. and that they "just weren't into all of that "and that she quit the medicine and has not followed up regarding that.  Also states that she has been depressed and cries a lot and is scared a lot.  Says that all of this seems to be getting worse.   Asked if she had ever been on any medication for these kinds of symptoms in the past.   Says that she was on one medication but that was many years ago and she has no idea what medicine it was.  Husband says that he has just been concerned because this is all getting worse.  Husband also says that she has "raw areas on the underside of her" abdomen.  Says that this is also bothering her.  No other specific concerns to address today.  At the end of the visit I told them that I was reviewing her chart to see when the last labs were checked etc. and saw that thyroid medicine is on her list.  Reviewed that her last TSH the result note states that patient had not been taking the medicine daily and they wanted the husband to use a pillbox and to visually watch patient take the pill to know that she was getting  this daily.   Today when I discussed this, husband seems to know nothing about that issue and patient states that she is not taking the thyroid medication daily.  I discussed the importance of thyroid and listed off multiple areas of the body that thyroid specifically effects and explained that thyroid effects entire body and could be contributing to her current symptoms and that she definitely needs to make sure to take the thyroid medication every single day.   AT THAT OV: Discussed the importance of restarting thyroid medication and making sure that she takes it daily.  Would plan to recheck TSH 6 weeks.  Also started Zoloft 50 mg daily and discussed expectations of medication and plan for follow-up in 6 weeks.    10/08/2017: Today when I enter the room she has a smile on her face and is in happy mood/affect.  Patient and husband both report that she is taking the thyroid medication every morning and also is taking the Zoloft daily.  Both report that she is feeling much better.  No longer crying.  No longer feeling sad and depressed.  Husband reports that he administers her medication daily and makes sure that she takes them.  They have no other concerns to address today.  I reviewed that in the past A1c  was checked and that she had been diagnosed with diabetes.  Also noted that she had been prescribed metformin in the past.  Husband reports that she is not taking the metformin.  Says that they had learned that if people take that medication-- especially for a long time-- that it can have bad effect on the kidney.  States that she is not currently taking that medication.  No other concerns to address today.     Past Medical History:  Diagnosis Date  . Anxiety   . Arthritis    ?OA vs Rheum (established with Dr. Jefm Bryant pending blood work)  . Articular cartilage disorder of shoulder 04/2012   right  . Back pain    h/o DDD since 2010  . Dental crowns present    also dental caps  .  Diabetes mellitus    NIDDM  . GERD (gastroesophageal reflux disease)   . Headache(784.0)    occasional  . History of skin cancer   . Hyperlipidemia    no current med.  . Hypertension   . Hyperthyroidism   . Hypothyroid   . Impingement syndrome of right shoulder 04/2012  . Memory impairment    dementia- worsenign 12/2016 with medication non compliance and delusions.    . Right rotator cuff tear 05/15/2012  . Rotator cuff rupture 04/2012   right  . Von Willebrand disease ([place])      Home Meds: Outpatient Medications Prior to Visit  Medication Sig Dispense Refill  . fenofibrate 160 MG tablet Take 1 tablet (160 mg total) by mouth daily. 30 tablet 3  . hydroxychloroquine (PLAQUENIL) 200 MG tablet Take 200 mg by mouth daily.    Marland Kitchen levothyroxine (SYNTHROID, LEVOTHROID) 88 MCG tablet TAKE 1 TABLET BY MOUTH ONCE A DAY 30 tablet 3  . lisinopril (PRINIVIL,ZESTRIL) 2.5 MG tablet TAKE 1 TABLET BY MOUTH ONCE A DAY 90 tablet 0  . meloxicam (MOBIC) 15 MG tablet Take 1 tablet (15 mg total) by mouth daily. (Patient taking differently: Take 15 mg by mouth as needed. )    . metFORMIN (GLUCOPHAGE-XR) 500 MG 24 hr tablet TAKE 1 TABLET BY MOUTH TWICE A DAY WITH MEALS 60 tablet 11  . metoprolol tartrate (LOPRESSOR) 25 MG tablet TAKE 1 TABLET BY MOUTH TWICE A DAY 60 tablet 5  . nystatin (MYCOSTATIN/NYSTOP) powder Apply topically 4 (four) times daily. 15 g 0  . nystatin cream (MYCOSTATIN) Apply 1 application topically 2 (two) times daily. 30 g 0  . omeprazole (PRILOSEC) 40 MG capsule TAKE 1 CAPSULE BY MOUTH ONCE DAILY 90 capsule 3  . pantoprazole (PROTONIX) 40 MG tablet Take 40 mg by mouth every morning.     . sertraline (ZOLOFT) 50 MG tablet Take 1 tablet (50 mg total) by mouth daily. 30 tablet 2  . levothyroxine (SYNTHROID, LEVOTHROID) 100 MCG tablet Take 1 tablet (100 mcg total) by mouth daily. 30 tablet 1   No facility-administered medications prior to visit.     Allergies:  Allergies  Allergen  Reactions  . Tramadol Other (See Comments)    CHANGES IN MEMORY    Social History   Socioeconomic History  . Marital status: Married    Spouse name: Trilby Drummer  . Number of children: 2  . Years of education: 94  . Highest education level: Not on file  Occupational History    Comment: retired  Scientific laboratory technician  . Financial resource strain: Not on file  . Food insecurity:    Worry: Not on file  Inability: Not on file  . Transportation needs:    Medical: Not on file    Non-medical: Not on file  Tobacco Use  . Smoking status: Never Smoker  . Smokeless tobacco: Never Used  Substance and Sexual Activity  . Alcohol use: No    Alcohol/week: 0.0 oz  . Drug use: No  . Sexual activity: Not Currently    Birth control/protection: Surgical  Lifestyle  . Physical activity:    Days per week: Not on file    Minutes per session: Not on file  . Stress: Not on file  Relationships  . Social connections:    Talks on phone: Not on file    Gets together: Not on file    Attends religious service: Not on file    Active member of club or organization: Not on file    Attends meetings of clubs or organizations: Not on file    Relationship status: Not on file  . Intimate partner violence:    Fear of current or ex partner: Not on file    Emotionally abused: Not on file    Physically abused: Not on file    Forced sexual activity: Not on file  Other Topics Concern  . Not on file  Social History Narrative   Patient lives at home with her husband Trilby Drummer).   Patient is retired .   Education high school.   Right handed.   Caffeine sweet tea two cups.       Family History  Problem Relation Age of Onset  . Dementia Mother   . Arthritis Mother   . Cancer Father        unknown primary  . Heart disease Father   . Thyroid disease Sister   . Cancer Sister        brain tumor  . Dementia Sister   . COPD Brother   . Cancer Brother        bone cancer  . Colon cancer Paternal Grandfather   .  Breast cancer Neg Hx      Review of Systems:  See HPI for pertinent ROS. All other ROS negative.     Physical Exam: Blood pressure 130/90, pulse 74, temperature 98.2 F (36.8 C), temperature source Oral, resp. rate 16, height 5\' 5"  (1.651 m), weight 72.9 kg (160 lb 12.8 oz), SpO2 98 %., Body mass index is 26.76 kg/m. General:  WNWD WF. Appears in no acute distress.  Neck: Supple. No thyromegaly. No lymphadenopathy. Lungs: Clear bilaterally to auscultation without wheezes, rales, or rhonchi. Breathing is unlabored. Heart: RRR with S1 S2. No murmurs, rubs, or gallops. Abdomen: Soft, non-tender, non-distended with normoactive bowel sounds. No hepatomegaly. No rebound/guarding. No obvious abdominal masses. Musculoskeletal:  Strength and tone normal for age. Extremities/Skin: Warm and dry.  Neuro: Alert and oriented X 3. Moves all extremities spontaneously. Gait is normal. CNII-XII grossly in tact. Psych:  Responds to questions appropriately with a normal affect. She has smile on her face, appears happy, positive mood, affect.      ASSESSMENT AND PLAN:   72 y.o. year old female with    1. Hypothyroidism, unspecified type 10/08/2017: She is now taking thyroid medication daily.  Husband is administering medication and making sure she is compliant.  Check TSH. - TSH  2. Current mild episode of major depressive disorder, unspecified whether recurrent ([place]) 10/08/2017: She is now taking the Zoloft.  Mood is much improved and she is no longer feeling depressed.  Medication causing no  adverse effects.  Continue Zoloft 50 mg daily.  3. Hyperglycemia 10/08/2017: In the past she was prescribed metformin.  She is not taking this medication at this time.  See HPI.  Recheck A1c to monitor. - BASIC METABOLIC PANEL WITH GFR - Hemoglobin A1c  Will plan for routine follow-up visit in 3 to 6 months.  Follow-up sooner if needed.  As well, if her A1c comes back elevated, will discuss a follow-up plan  at that point.   Marin Olp [place], [place], [facility] 10/08/2017 2:38 PM

## 2017-10-09 LAB — BASIC METABOLIC PANEL WITH GFR
BUN: 16 mg/dL (ref 7–25)
CO2: 25 mmol/L (ref 20–32)
Calcium: 9.4 mg/dL (ref 8.6–10.4)
Chloride: 107 mmol/L (ref 98–110)
Creat: 0.85 mg/dL (ref 0.60–0.93)
GFR, Est African American: 80 mL/min/{1.73_m2} (ref >=60)
GFR, Est Non African American: 69 mL/min/{1.73_m2} (ref >=60)
Glucose, Bld: 106 mg/dL — ABNORMAL HIGH (ref 65–99)
Potassium: 3.9 mmol/L (ref 3.5–5.3)
Sodium: 144 mmol/L (ref 135–146)

## 2017-10-09 LAB — HEMOGLOBIN A1C
Hgb A1c MFr Bld: 6 % of total Hgb — ABNORMAL HIGH (ref <5.7)
Mean Plasma Glucose: 126 (calc)
eAG (mmol/L): 7 (calc)

## 2017-10-09 LAB — TSH: TSH: 2.34 mIU/L (ref 0.40–4.50)

## 2017-11-28 ENCOUNTER — Other Ambulatory Visit: Payer: Self-pay | Admitting: Physician Assistant

## 2017-11-28 ENCOUNTER — Other Ambulatory Visit: Payer: Self-pay | Admitting: Family Medicine

## 2017-11-28 DIAGNOSIS — F320 Major depressive disorder, single episode, mild: Secondary | ICD-10-CM

## 2018-01-08 ENCOUNTER — Other Ambulatory Visit: Payer: Self-pay

## 2018-01-08 ENCOUNTER — Ambulatory Visit (INDEPENDENT_AMBULATORY_CARE_PROVIDER_SITE_OTHER): Payer: PPO | Admitting: Physician Assistant

## 2018-01-08 ENCOUNTER — Encounter: Payer: Self-pay | Admitting: Physician Assistant

## 2018-01-08 VITALS — BP 126/82 | HR 62 | Temp 97.7°F | Resp 14 | Ht 65.0 in | Wt 161.8 lb

## 2018-01-08 DIAGNOSIS — E039 Hypothyroidism, unspecified: Secondary | ICD-10-CM

## 2018-01-08 DIAGNOSIS — R413 Other amnesia: Secondary | ICD-10-CM

## 2018-01-08 DIAGNOSIS — R159 Full incontinence of feces: Secondary | ICD-10-CM | POA: Insufficient documentation

## 2018-01-08 DIAGNOSIS — I10 Essential (primary) hypertension: Secondary | ICD-10-CM | POA: Diagnosis not present

## 2018-01-08 DIAGNOSIS — F320 Major depressive disorder, single episode, mild: Secondary | ICD-10-CM | POA: Diagnosis not present

## 2018-01-09 NOTE — Progress Notes (Signed)
Patient ID: KATJA BLUE MRN: [id number], DOB: 10-06-1945, 72 y.o. Date of Encounter: @DATE @  Chief Complaint:  Chief Complaint  Patient presents with  . Hypothyroidism  . Diabetes  . 3 month follow up    HPI: 72 y.o. year old female  presents with above.     08/27/2017:  Husband accompanies her for visit. Reports that they both see Dr. Dennard Schaumann as their PCP and really like him but patient was feeling that she might be more comfortable with a female to discuss her current issues.  Therefore scheduled today's visit with me.  [name] does add a few comments but her husband provides most of the history and information.  He says that "she has a memory problem that she has had for about 10 years and that she has some Alzheimer's medicine but she quit the medicine."  Says that they saw a doctor at neurology that discussed putting her into a research study etc. and that they "just weren't into all of that "and that she quit the medicine and has not followed up regarding that.  Also states that she has been depressed and cries a lot and is scared a lot.  Says that all of this seems to be getting worse.   Asked if she had ever been on any medication for these kinds of symptoms in the past.   Says that she was on one medication but that was many years ago and she has no idea what medicine it was.  Husband says that he has just been concerned because this is all getting worse.  Husband also says that she has "raw areas on the underside of her" abdomen.  Says that this is also bothering her.  No other specific concerns to address today.  At the end of the visit I told them that I was reviewing her chart to see when the last labs were checked etc. and saw that thyroid medicine is on her list.  Reviewed that her last TSH the result note states that patient had not been taking the medicine daily and they wanted the husband to use a pillbox and to visually watch patient take the pill to  know that she was getting this daily.   Today when I discussed this, husband seems to know nothing about that issue and patient states that she is not taking the thyroid medication daily.  I discussed the importance of thyroid and listed off multiple areas of the body that thyroid specifically effects and explained that thyroid effects entire body and could be contributing to her current symptoms and that she definitely needs to make sure to take the thyroid medication every single day.   AT THAT OV: Discussed the importance of restarting thyroid medication and making sure that she takes it daily.  Would plan to recheck TSH 6 weeks.  Also started Zoloft 50 mg daily and discussed expectations of medication and plan for follow-up in 6 weeks.    10/08/2017: Today when I enter the room she has a smile on her face and is in happy mood/affect.  Patient and husband both report that she is taking the thyroid medication every morning and also is taking the Zoloft daily.  Both report that she is feeling much better.  No longer crying.  No longer feeling sad and depressed.  Husband reports that he administers her medication daily and makes sure that she takes them.  They have no other concerns to address today.  I  reviewed that in the past A1c was checked and that she had been diagnosed with diabetes.  Also noted that she had been prescribed metformin in the past.  Husband reports that she is not taking the metformin.  Says that they had learned that if people take that medication-- especially for a long time-- that it can have bad effect on the kidney.  States that she is not currently taking that medication.  No other concerns to address today.    01/08/2018: Today husband reports that pt "has been having one problem he wanted to talk to me about today" They report that she has recently been having episodes of fecal incontinence.  Patient says that when it happens, she has no warning.  Says it isn't an  issue of urgency and cant get to bathroom fast enough---instead, says she may be walking across floor and all the sudden large amount of stool pours out of her.  Husband reports has had 3 -4 episodes over past 2 -3 months.  Husband reports he has seen Dr. Hilarie Fredrickson and they both like Dr. Hilarie Fredrickson and requests to see him as her GI doctor.   Otherwise, things have been stable.  Zoloft continues to work well. Husband says " she's happy, so I'm happy" !! He notices that she isnt irritable toward him. Taking thyroid medication daily.       Past Medical History:  Diagnosis Date  . Anxiety   . Arthritis    ?OA vs Rheum (established with Dr. Jefm Bryant pending blood work)  . Articular cartilage disorder of shoulder 04/2012   right  . Back pain    h/o DDD since 2010  . Dental crowns present    also dental caps  . Diabetes mellitus    NIDDM  . GERD (gastroesophageal reflux disease)   . Headache(784.0)    occasional  . History of skin cancer   . Hyperlipidemia    no current med.  . Hypertension   . Hyperthyroidism   . Hypothyroid   . Impingement syndrome of right shoulder 04/2012  . Memory impairment    dementia- worsenign 12/2016 with medication non compliance and delusions.    . Right rotator cuff tear 05/15/2012  . Rotator cuff rupture 04/2012   right  . Von Willebrand disease ([place])      Home Meds: Outpatient Medications Prior to Visit  Medication Sig Dispense Refill  . fenofibrate 160 MG tablet Take 1 tablet (160 mg total) by mouth daily. 30 tablet 3  . hydroxychloroquine (PLAQUENIL) 200 MG tablet Take 200 mg by mouth daily.    Marland Kitchen levothyroxine (SYNTHROID, LEVOTHROID) 88 MCG tablet TAKE 1 TABLET BY MOUTH ONCE A DAY 30 tablet 3  . lisinopril (PRINIVIL,ZESTRIL) 2.5 MG tablet TAKE 1 TABLET BY MOUTH ONCE A DAY 90 tablet 0  . meloxicam (MOBIC) 15 MG tablet Take 1 tablet (15 mg total) by mouth daily. (Patient taking differently: Take 15 mg by mouth as needed. )    . metoprolol tartrate  (LOPRESSOR) 25 MG tablet TAKE 1 TABLET BY MOUTH TWICE A DAY 60 tablet 5  . nystatin (MYCOSTATIN/NYSTOP) powder Apply topically 4 (four) times daily. 15 g 0  . nystatin cream (MYCOSTATIN) Apply 1 application topically 2 (two) times daily. 30 g 0  . omeprazole (PRILOSEC) 40 MG capsule TAKE 1 CAPSULE BY MOUTH ONCE DAILY 90 capsule 3  . pantoprazole (PROTONIX) 40 MG tablet Take 40 mg by mouth every morning.     . sertraline (ZOLOFT) 50 MG  tablet TAKE 1 TABLET BY MOUTH ONCE A DAY 30 tablet 2  . metFORMIN (GLUCOPHAGE-XR) 500 MG 24 hr tablet TAKE 1 TABLET BY MOUTH TWICE A DAY WITH MEALS 60 tablet 11   No facility-administered medications prior to visit.     Allergies:  Allergies  Allergen Reactions  . Tramadol Other (See Comments)    CHANGES IN MEMORY    Social History   Socioeconomic History  . Marital status: Married    Spouse name: Trilby Drummer  . Number of children: 2  . Years of education: 84  . Highest education level: Not on file  Occupational History    Comment: retired  Scientific laboratory technician  . Financial resource strain: Not on file  . Food insecurity:    Worry: Not on file    Inability: Not on file  . Transportation needs:    Medical: Not on file    Non-medical: Not on file  Tobacco Use  . Smoking status: Never Smoker  . Smokeless tobacco: Never Used  Substance and Sexual Activity  . Alcohol use: No    Alcohol/week: 0.0 standard drinks  . Drug use: No  . Sexual activity: Not Currently    Birth control/protection: Surgical  Lifestyle  . Physical activity:    Days per week: Not on file    Minutes per session: Not on file  . Stress: Not on file  Relationships  . Social connections:    Talks on phone: Not on file    Gets together: Not on file    Attends religious service: Not on file    Active member of club or organization: Not on file    Attends meetings of clubs or organizations: Not on file    Relationship status: Not on file  . Intimate partner violence:    Fear of  current or ex partner: Not on file    Emotionally abused: Not on file    Physically abused: Not on file    Forced sexual activity: Not on file  Other Topics Concern  . Not on file  Social History Narrative   Patient lives at home with her husband Trilby Drummer).   Patient is retired .   Education high school.   Right handed.   Caffeine sweet tea two cups.       Family History  Problem Relation Age of Onset  . Dementia Mother   . Arthritis Mother   . Cancer Father        unknown primary  . Heart disease Father   . Thyroid disease Sister   . Cancer Sister        brain tumor  . Dementia Sister   . COPD Brother   . Cancer Brother        bone cancer  . Colon cancer Paternal Grandfather   . Breast cancer Neg Hx      Review of Systems:  See HPI for pertinent ROS. All other ROS negative.     Physical Exam: Blood pressure 126/82, pulse 62, temperature 97.7 F (36.5 C), temperature source Oral, resp. rate 14, height 5\' 5"  (1.651 m), weight 73.4 kg, SpO2 97 %., Body mass index is 26.92 kg/m. General: WNWD WF. Appears in no acute distress. Neck: Supple. No thyromegaly. No lymphadenopathy. No carotid bruits. Lungs: Clear bilaterally to auscultation without wheezes, rales, or rhonchi. Breathing is unlabored. Heart: RRR with S1 S2. No murmurs, rubs, or gallops. Abdomen: Soft, non-tender, non-distended with normoactive bowel sounds. No hepatomegaly. No rebound/guarding. No obvious abdominal  masses. Musculoskeletal:  Strength and tone normal for age. Extremities/Skin: Warm and dry.  No edema.  Neuro: Alert and oriented X 3. Moves all extremities spontaneously. Gait is normal. CNII-XII grossly in tact. Psych:  Responds to questions appropriately with a normal affect.She has smile on her face through visit!!       ASSESSMENT AND PLAN:   71 y.o. year old female with    Incontinence of feces, unspecified fecal incontinence type 01/08/2018: They report that she has recently been  having episodes of fecal incontinence.  Patient says that when it happens, she has no warning.  Says it isn't an issue of urgency and cant get to bathroom fast enough---instead, says she may be walking across floor and all the sudden large amount of stool pours out of her.  Husband reports has had 3 -4 episodes over past 2 -3 months.  Husband reports he has seen Dr. Hilarie Fredrickson and they both like Dr. Hilarie Fredrickson and requests to see him as her GI doctor.  - Ambulatory referral to Gastroenterology  Essential hypertension 01/08/2018: BP at goal, controlled. Cont current meds same  Memory loss 01/08/2018: Pt, husband do not want to treat with medications. This is stable.   Hypothyroidism, unspecified type 10/08/2017: She is now taking thyroid medication daily.  Husband is administering medication and making sure she is compliant.  Check TSH. - TSH 01/08/2018: TSH 10/08/2017 normal--Cont current dose thyroid.   Current mild episode of major depressive disorder, unspecified whether recurrent ([place]) 10/08/2017: She is now taking the Zoloft.  Mood is much improved and she is no longer feeling depressed.  Medication causing no adverse effects.  Continue Zoloft 50 mg daily. 01/08/2018: controlled, cont current dose zoloft.   Hyperglycemia 10/08/2017: In the past she was prescribed metformin.  She is not taking this medication at this time.  See HPI.  Recheck A1c to monitor. - BASIC METABOLIC PANEL WITH GFR - Hemoglobin A1c 01/08/2018: On lab 10/08/2017 A1C was 6.0. Informed that she can stay off metformin. Requires no Rx. Treat with low carb diet.    Will plan for routine follow-up visit in 3  months.  Follow-up sooner if needed.     Signed, [address] [place], [place], BSFM 01/09/2018 7:30 AM

## 2018-01-12 DIAGNOSIS — Z85828 Personal history of other malignant neoplasm of skin: Secondary | ICD-10-CM | POA: Diagnosis not present

## 2018-01-12 DIAGNOSIS — L578 Other skin changes due to chronic exposure to nonionizing radiation: Secondary | ICD-10-CM | POA: Diagnosis not present

## 2018-01-12 DIAGNOSIS — E119 Type 2 diabetes mellitus without complications: Secondary | ICD-10-CM | POA: Diagnosis not present

## 2018-01-12 DIAGNOSIS — L570 Actinic keratosis: Secondary | ICD-10-CM | POA: Diagnosis not present

## 2018-01-16 ENCOUNTER — Encounter: Payer: Self-pay | Admitting: Internal Medicine

## 2018-01-31 ENCOUNTER — Emergency Department (HOSPITAL_COMMUNITY): Payer: PPO

## 2018-01-31 ENCOUNTER — Encounter (HOSPITAL_COMMUNITY): Payer: Self-pay

## 2018-01-31 ENCOUNTER — Other Ambulatory Visit: Payer: Self-pay

## 2018-01-31 ENCOUNTER — Emergency Department (HOSPITAL_COMMUNITY)
Admission: EM | Admit: 2018-01-31 | Discharge: 2018-01-31 | Disposition: A | Discharge status: Home / Self Care | Payer: PPO | Attending: Emergency Medicine | Admitting: Emergency Medicine

## 2018-01-31 DIAGNOSIS — M25512 Pain in left shoulder: Secondary | ICD-10-CM | POA: Diagnosis not present

## 2018-01-31 DIAGNOSIS — S8992XA Unspecified injury of left lower leg, initial encounter: Secondary | ICD-10-CM | POA: Diagnosis not present

## 2018-01-31 DIAGNOSIS — Z79899 Other long term (current) drug therapy: Secondary | ICD-10-CM | POA: Diagnosis not present

## 2018-01-31 DIAGNOSIS — M25562 Pain in left knee: Secondary | ICD-10-CM | POA: Diagnosis not present

## 2018-01-31 DIAGNOSIS — S0291XA Unspecified fracture of skull, initial encounter for closed fracture: Secondary | ICD-10-CM | POA: Diagnosis not present

## 2018-01-31 DIAGNOSIS — S99921A Unspecified injury of right foot, initial encounter: Secondary | ICD-10-CM | POA: Diagnosis not present

## 2018-01-31 DIAGNOSIS — E119 Type 2 diabetes mellitus without complications: Secondary | ICD-10-CM | POA: Diagnosis not present

## 2018-01-31 DIAGNOSIS — Y999 Unspecified external cause status: Secondary | ICD-10-CM | POA: Diagnosis not present

## 2018-01-31 DIAGNOSIS — I10 Essential (primary) hypertension: Secondary | ICD-10-CM | POA: Insufficient documentation

## 2018-01-31 DIAGNOSIS — Y9389 Activity, other specified: Secondary | ICD-10-CM | POA: Insufficient documentation

## 2018-01-31 DIAGNOSIS — Y92013 Bedroom of single-family (private) house as the place of occurrence of the external cause: Secondary | ICD-10-CM | POA: Insufficient documentation

## 2018-01-31 DIAGNOSIS — S9031XA Contusion of right foot, initial encounter: Secondary | ICD-10-CM | POA: Diagnosis not present

## 2018-01-31 DIAGNOSIS — W19XXXA Unspecified fall, initial encounter: Secondary | ICD-10-CM

## 2018-01-31 DIAGNOSIS — S0990XA Unspecified injury of head, initial encounter: Secondary | ICD-10-CM | POA: Diagnosis not present

## 2018-01-31 DIAGNOSIS — W010XXA Fall on same level from slipping, tripping and stumbling without subsequent striking against object, initial encounter: Secondary | ICD-10-CM | POA: Insufficient documentation

## 2018-01-31 DIAGNOSIS — S199XXA Unspecified injury of neck, initial encounter: Secondary | ICD-10-CM | POA: Diagnosis not present

## 2018-01-31 DIAGNOSIS — R404 Transient alteration of awareness: Secondary | ICD-10-CM | POA: Diagnosis not present

## 2018-01-31 DIAGNOSIS — S299XXA Unspecified injury of thorax, initial encounter: Secondary | ICD-10-CM | POA: Diagnosis not present

## 2018-01-31 DIAGNOSIS — M25552 Pain in left hip: Secondary | ICD-10-CM | POA: Diagnosis not present

## 2018-01-31 DIAGNOSIS — M79671 Pain in right foot: Secondary | ICD-10-CM | POA: Diagnosis not present

## 2018-01-31 DIAGNOSIS — E039 Hypothyroidism, unspecified: Secondary | ICD-10-CM | POA: Diagnosis not present

## 2018-01-31 DIAGNOSIS — R569 Unspecified convulsions: Secondary | ICD-10-CM | POA: Diagnosis not present

## 2018-01-31 DIAGNOSIS — S4992XA Unspecified injury of left shoulder and upper arm, initial encounter: Secondary | ICD-10-CM | POA: Diagnosis not present

## 2018-01-31 DIAGNOSIS — S79912A Unspecified injury of left hip, initial encounter: Secondary | ICD-10-CM | POA: Diagnosis not present

## 2018-01-31 LAB — CBC
HCT: 40.3 % (ref 36.0–46.0)
Hemoglobin: 12.8 g/dL (ref 12.0–15.0)
MCH: 26.8 pg (ref 26.0–34.0)
MCHC: 31.8 g/dL (ref 30.0–36.0)
MCV: 84.5 fL (ref 78.0–100.0)
Platelets: 192 10*3/uL (ref 150–400)
RBC: 4.77 MIL/uL (ref 3.87–5.11)
RDW: 14.8 % (ref 11.5–15.5)
WBC: 7.3 10*3/uL (ref 4.0–10.5)

## 2018-01-31 LAB — URINALYSIS, ROUTINE W REFLEX MICROSCOPIC
Bilirubin Urine: NEGATIVE
Glucose, UA: NEGATIVE mg/dL
Hgb urine dipstick: NEGATIVE
Ketones, ur: NEGATIVE mg/dL
Leukocytes, UA: NEGATIVE
Nitrite: NEGATIVE
Protein, ur: NEGATIVE mg/dL
Specific Gravity, Urine: 1.019 (ref 1.005–1.030)
pH: 8 (ref 5.0–8.0)

## 2018-01-31 LAB — COMPREHENSIVE METABOLIC PANEL
ALT: 16 U/L (ref 0–44)
AST: 27 U/L (ref 15–41)
Albumin: 4.1 g/dL (ref 3.5–5.0)
Alkaline Phosphatase: 80 U/L (ref 38–126)
Anion gap: 14 (ref 5–15)
BUN: 15 mg/dL (ref 8–23)
CO2: 21 mmol/L — ABNORMAL LOW (ref 22–32)
Calcium: 9.6 mg/dL (ref 8.9–10.3)
Chloride: 108 mmol/L (ref 98–111)
Creatinine, Ser: 1.04 mg/dL — ABNORMAL HIGH (ref 0.44–1.00)
GFR calc Af Amer: >60 mL/min (ref >60)
GFR calc non Af Amer: 53 mL/min — ABNORMAL LOW (ref >60)
Glucose, Bld: 148 mg/dL — ABNORMAL HIGH (ref 70–99)
Potassium: 3.4 mmol/L — ABNORMAL LOW (ref 3.5–5.1)
Sodium: 143 mmol/L (ref 135–145)
Total Bilirubin: 0.6 mg/dL (ref 0.3–1.2)
Total Protein: 6.9 g/dL (ref 6.5–8.1)

## 2018-01-31 LAB — I-STAT TROPONIN, ED: Troponin i, poc: 0 ng/mL (ref 0.00–0.08)

## 2018-01-31 NOTE — ED Notes (Signed)
Patient transported to X-ray 

## 2018-01-31 NOTE — Discharge Instructions (Signed)
Please return to the emergency department if you develop a high fever, or have any repeat of shaking episodes. Please keep right foot elevated and use cold therapy and Tylenol as needed for pain. Please recheck with your doctor next week

## 2018-01-31 NOTE — ED Notes (Signed)
Patient was able to ambulate with assistance. Patient did tear up when walking so post-op shoe was applied for comfort.

## 2018-01-31 NOTE — ED Notes (Signed)
Patient given cup of water.

## 2018-01-31 NOTE — ED Provider Notes (Signed)
[facility] EMERGENCY DEPARTMENT Provider Note   CSN: [id number] Arrival date & time: 01/31/18  1431     History   Chief Complaint Chief Complaint  Patient presents with  . Fall  . Seizures    HPI Stacy Davis is a 72 y.o. female.  HPI  72 year old female with dementia presents today via EMS.  Level 5 caveat due to patient unable to give history.  Her husband is at bedside and gives me the history.  He states that they were making a bed.  She was going around to place a pillow and lost her balance landing on her head between the bed and the wall.  It appeared to him that her left knee gave out.  After the fall she began having a shaking episode and was not responsive.  This lasted 15 to 20 minutes.  It resolved on its own.  She was confused for some period of time afterwards.  She is currently confused but according to him, back to baseline.  Past Medical History:  Diagnosis Date  . Anxiety   . Arthritis    ?OA vs Rheum (established with Dr. Jefm Bryant pending blood work)  . Articular cartilage disorder of shoulder 04/2012   right  . Back pain    h/o DDD since 2010  . Dental crowns present    also dental caps  . Diabetes mellitus    NIDDM  . GERD (gastroesophageal reflux disease)   . Headache(784.0)    occasional  . History of skin cancer   . Hyperlipidemia    no current med.  . Hypertension   . Hyperthyroidism   . Hypothyroid   . Impingement syndrome of right shoulder 04/2012  . Memory impairment    dementia- worsenign 12/2016 with medication non compliance and delusions.    . Right rotator cuff tear 05/15/2012  . Rotator cuff rupture 04/2012   right  . Von Willebrand disease [facility])     Patient Active Problem List   Diagnosis Date Noted  . Incontinence of feces 01/08/2018  . Depression 10/08/2017  . Memory impairment   . Advance care planning 10/06/2014  . Joint pain 04/15/2013  . Advance directive on file 08/12/2012  . Confusion  06/10/2012  . Right rotator cuff tear 05/15/2012  . Shoulder pain 05/03/2012  . Insomnia 08/13/2011  . 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 HTN (hypertension) 07/18/2011  . HLD (hyperlipidemia) 07/18/2011  . Memory loss 07/18/2011  . Hypothyroid 07/18/2011  . SVT (supraventricular tachycardia) ([place]) 07/18/2011  . Von Willebrand disease ([place]) 07/18/2011  . GERD (gastroesophageal reflux disease) 07/18/2011  . Back pain 07/18/2011  . Fatty liver 07/18/2011    Past Surgical History:  Procedure Laterality Date  . APPENDECTOMY    . BREAST SURGERY     hematoma evacuation due to trauma  . CARDIAC CATHETERIZATION  03/06/2001  . CESAREAN SECTION     x 2  . COLONOSCOPY  01/20/2006 per patient  . FRACTURE SURGERY    . KNEE ARTHROSCOPY Right 12/01/2014   Procedure: ARTHROSCOPY KNEE,partial medial menisectomy and plica excision;  Surgeon: Hessie Knows, MD;  Location: [facility] ORS;  Service: Orthopedics;  Laterality: Right;  . LUMBAR LAMINECTOMY/DECOMPRESSION MICRODISCECTOMY  03/01/2009   right L4-5; arthrodesis L4-5  . ORIF WRIST FRACTURE     left  . SHOULDER ARTHROSCOPY WITH ROTATOR CUFF REPAIR AND SUBACROMIAL DECOMPRESSION  05/15/2012   Procedure: SHOULDER ARTHROSCOPY WITH ROTATOR CUFF REPAIR AND SUBACROMIAL DECOMPRESSION;  Surgeon: Johnny Bridge, MD;  Location: [facility];  Service: Orthopedics;  Laterality: Right;  RIGHT ARTHROSCOPY SHOULDER DEBRIDEMENT LIMITED, ARTHROSCOPY SHOULDER DECOMPRESSION SUBACROMIAL PARTIAL ACROMIOPLASTY WITH CORACOACROMIAL RELEASE, ARTHROSCOPY SHOULDER WITH ROTATOR CUFF REPAIR  . TUBAL LIGATION       OB History   None      Home Medications    Prior to Admission medications   Medication Sig Start Date End Date Taking? Authorizing Provider  fenofibrate 160 MG tablet Take 1 tablet (160 mg total) by mouth daily. 01/25/15   Susy Frizzle, MD  hydroxychloroquine (PLAQUENIL) 200 MG tablet Take 200 mg by  mouth daily.    [provider]  levothyroxine (SYNTHROID, LEVOTHROID) 88 MCG tablet TAKE 1 TABLET BY MOUTH ONCE A DAY 11/28/17   Susy Frizzle, MD  lisinopril (PRINIVIL,ZESTRIL) 2.5 MG tablet TAKE 1 TABLET BY MOUTH ONCE A DAY 01/18/16   Susy Frizzle, MD  meloxicam (MOBIC) 15 MG tablet Take 1 tablet (15 mg total) by mouth daily. Patient taking differently: Take 15 mg by mouth as needed.  10/06/14   Tonia Ghent, MD  metoprolol tartrate (LOPRESSOR) 25 MG tablet TAKE 1 TABLET BY MOUTH TWICE A DAY 02/07/15   Susy Frizzle, MD  nystatin (MYCOSTATIN/NYSTOP) powder Apply topically 4 (four) times daily. 08/27/17   Orlena Sheldon, PA-C  nystatin cream (MYCOSTATIN) Apply 1 application topically 2 (two) times daily. 08/27/17   Dena Billet B, PA-C  omeprazole (PRILOSEC) 40 MG capsule TAKE 1 CAPSULE BY MOUTH ONCE DAILY 09/08/17   Susy Frizzle, MD  pantoprazole (PROTONIX) 40 MG tablet Take 40 mg by mouth every morning.     [provider]  sertraline (ZOLOFT) 50 MG tablet TAKE 1 TABLET BY MOUTH ONCE A DAY 11/28/17   Orlena Sheldon, PA-C    Family History Family History  Problem Relation Age of Onset  . Dementia Mother   . Arthritis Mother   . Cancer Father        unknown primary  . Heart disease Father   . Thyroid disease Sister   . Cancer Sister        brain tumor  . Dementia Sister   . COPD Brother   . Cancer Brother        bone cancer  . Colon cancer Paternal Grandfather   . Breast cancer Neg Hx     Social History Social History   Tobacco Use  . Smoking status: Never Smoker  . Smokeless tobacco: Never Used  Substance Use Topics  . Alcohol use: No    Alcohol/week: 0.0 standard drinks  . Drug use: No     Allergies   Tramadol   Review of Systems Review of Systems  Unable to perform ROS: Dementia     Physical Exam Updated Vital Signs Temp 97.7 F (36.5 C) (Oral)   Ht 1.626 m (5\' 4" )   Wt 65.8 kg   BMI 24.89 kg/m   Physical Exam    Constitutional: She appears well-developed and well-nourished.  Tearful  HENT:  Head: Normocephalic.  Right Ear: External ear normal.  Left Ear: External ear normal.  Nose: Nose normal.  Mouth/Throat: Oropharynx is clear and moist.  Swelling right side of head  Eyes: Pupils are equal, round, and reactive to light. Conjunctivae and EOM are normal.  Neck:  Towel wrap for cervical precautions  Cardiovascular: Normal rate, regular rhythm, normal heart sounds and intact distal pulses.  Pulmonary/Chest: Effort normal and breath  sounds normal.  Abdominal: Soft.  Musculoskeletal: Normal range of motion.  Neurological: She is alert.  Skin: Skin is warm and dry. Capillary refill takes less than 2 seconds.  Nursing note and vitals reviewed.    ED Treatments / Results  Labs (all labs ordered are listed, but only abnormal results are displayed) Labs Reviewed  CBC WITH DIFFERENTIAL/PLATELET  COMPREHENSIVE METABOLIC PANEL  URINALYSIS, ROUTINE W REFLEX MICROSCOPIC  CBC  COMPREHENSIVE METABOLIC PANEL  URINALYSIS, ROUTINE W REFLEX MICROSCOPIC  I-STAT TROPONIN, ED  CBG MONITORING, ED    EKG EKG Interpretation  Date/Time:  Saturday January 31 2018 14:44:52 EDT Ventricular Rate:  91 PR Interval:    QRS Duration: 78 QT Interval:  376 QTC Calculation: 463 R Axis:   46 Text Interpretation:  Normal sinus rhythm Poor R wave progression Non-specific ST-t changes Confirmed by Pattricia Boss [phone]) on 01/31/2018 5:14:42 PM   Radiology Ct Head Wo Contrast  Result Date: 01/31/2018 CLINICAL DATA:  Status post fall and generalized seizure. EXAM: CT HEAD WITHOUT CONTRAST CT CERVICAL SPINE WITHOUT CONTRAST TECHNIQUE: Multidetector CT imaging of the head and cervical spine was performed following the standard protocol without intravenous contrast. Multiplanar CT image reconstructions of the cervical spine were also generated. COMPARISON:  Brain MRI 11/08/2014, head CT 08/14/2011 FINDINGS: CT HEAD  FINDINGS Brain: No evidence of acute infarction, hemorrhage, hydrocephalus, extra-axial collection or mass lesion/mass effect. Mild brain parenchymal volume loss. Vascular: Calcific atherosclerotic disease of the intra cavernous carotid arteries. Skull: Normal. Negative for fracture or focal lesion. Sinuses/Orbits: No acute finding. Other: None. CT CERVICAL SPINE FINDINGS Alignment: Straightening of cervical lordosis. Skull base and vertebrae: No acute fracture. No primary bone lesion or focal pathologic process. Soft tissues and spinal canal: No prevertebral fluid or swelling. No visible canal hematoma. Disc levels:  Multilevel osteoarthritic changes, moderate to severe. Upper chest: Negative. Other: None. IMPRESSION: No acute intracranial abnormality. Mild brain parenchymal atrophy. No evidence of acute traumatic injury to the cervical spine. Multilevel osteoarthritic changes of the cervical spine. Electronically Signed   By: Fidela Salisbury M.D.   On: 01/31/2018 17:07   Ct Cervical Spine Wo Contrast  Result Date: 01/31/2018 CLINICAL DATA:  Status post fall and generalized seizure. EXAM: CT HEAD WITHOUT CONTRAST CT CERVICAL SPINE WITHOUT CONTRAST TECHNIQUE: Multidetector CT imaging of the head and cervical spine was performed following the standard protocol without intravenous contrast. Multiplanar CT image reconstructions of the cervical spine were also generated. COMPARISON:  Brain MRI 11/08/2014, head CT 08/14/2011 FINDINGS: CT HEAD FINDINGS Brain: No evidence of acute infarction, hemorrhage, hydrocephalus, extra-axial collection or mass lesion/mass effect. Mild brain parenchymal volume loss. Vascular: Calcific atherosclerotic disease of the intra cavernous carotid arteries. Skull: Normal. Negative for fracture or focal lesion. Sinuses/Orbits: No acute finding. Other: None. CT CERVICAL SPINE FINDINGS Alignment: Straightening of cervical lordosis. Skull base and vertebrae: No acute fracture. No primary  bone lesion or focal pathologic process. Soft tissues and spinal canal: No prevertebral fluid or swelling. No visible canal hematoma. Disc levels:  Multilevel osteoarthritic changes, moderate to severe. Upper chest: Negative. Other: None. IMPRESSION: No acute intracranial abnormality. Mild brain parenchymal atrophy. No evidence of acute traumatic injury to the cervical spine. Multilevel osteoarthritic changes of the cervical spine. Electronically Signed   By: Fidela Salisbury M.D.   On: 01/31/2018 17:07    Procedures Procedures (including critical care time)  Medications Ordered in ED Medications - No data to display   Initial Impression / Assessment and Plan / ED Course  I have reviewed the triage vital signs and the nursing notes.  Pertinent labs & imaging results that were available during my care of the patient were reviewed by me and considered in my medical decision making (see chart for details).    5:20 PM Patient co right toe pain.  Additional x Zyan Mirkin ordered Low-grade fever here.  No obvious source of infection noted.  Chest x-Carleta Woodrow clear and urine clear.  Awaiting results of x-rays Patient remains hemodynamically stable here in the ED with the exception of a one-time blood pressure was 82/63 without intervention the remainder of her labs and blood pressures have been normal.  I suspect this was an errant blood pressure.  Had low-grade rectal temp 100.3 but has no other signs of infection, urine is clear, and chest x-Kaori Jumper is clear.  She has a bruise over her distal right foot but there does not appear to be fracture on her x-rays.  Although there was a question of some shaking at the beginning is unclear whether or not the patient had a true seizure.  She has had a head CT here that does not show any acute intracranial abnormality has not been observed for several hours that any repeat seizure activity and would not treat at this time.  However they are cautioned regarding need for return  if there are any more seizures, or seizure type activity.  She has been ambulatory without difficulty.  I discussed return precautions with the patient and her husband and they voiced understanding. Vitals:   01/31/18 1933 01/31/18 2005  BP: 123/65 140/78  Pulse: 69 84  Resp: (!) 26 (!) 24  Temp:    SpO2: 99% 100%    Final Clinical Impressions(s) / ED Diagnoses   Final diagnoses:  Fall, initial encounter  Contusion of right foot, initial encounter    ED Discharge Orders    None       Pattricia Boss, MD 01/31/18 2057

## 2018-01-31 NOTE — ED Provider Notes (Signed)
MSE Patient with dementia coming from home. She had a fall with generalized seizure activity that lasted several minutes. This was witnessed by has been. On EMS arrival she was postictal with a GCS of nine. EMS notes her mental status has improved throughout transport. No history of prior seizure activity. EMS did note the patient complained of left sided back pain in transit.  Patient awake and alert, denies any pain. Confused appearing. Will initiate evaluation with imaging and labs.   Quintella Reichert, MD 01/31/18 1504

## 2018-01-31 NOTE — ED Triage Notes (Signed)
Per family patient fell, hit her head and then had a seizure. Patient has dementia normally and is AAO x 2 at this time.

## 2018-02-23 ENCOUNTER — Encounter: Payer: Self-pay | Admitting: *Deleted

## 2018-02-23 ENCOUNTER — Ambulatory Visit (INDEPENDENT_AMBULATORY_CARE_PROVIDER_SITE_OTHER): Payer: PPO | Admitting: Family Medicine

## 2018-02-23 DIAGNOSIS — Z23 Encounter for immunization: Secondary | ICD-10-CM | POA: Diagnosis not present

## 2018-03-05 DIAGNOSIS — L570 Actinic keratosis: Secondary | ICD-10-CM | POA: Diagnosis not present

## 2018-03-05 DIAGNOSIS — L578 Other skin changes due to chronic exposure to nonionizing radiation: Secondary | ICD-10-CM | POA: Diagnosis not present

## 2018-03-05 DIAGNOSIS — Z85828 Personal history of other malignant neoplasm of skin: Secondary | ICD-10-CM | POA: Diagnosis not present

## 2018-03-06 ENCOUNTER — Ambulatory Visit (INDEPENDENT_AMBULATORY_CARE_PROVIDER_SITE_OTHER): Payer: PPO | Admitting: Internal Medicine

## 2018-03-06 ENCOUNTER — Encounter: Payer: Self-pay | Admitting: Internal Medicine

## 2018-03-06 VITALS — BP 128/76 | HR 68 | Ht 63.0 in | Wt 158.4 lb

## 2018-03-06 DIAGNOSIS — R195 Other fecal abnormalities: Secondary | ICD-10-CM

## 2018-03-06 DIAGNOSIS — Z1211 Encounter for screening for malignant neoplasm of colon: Secondary | ICD-10-CM

## 2018-03-06 DIAGNOSIS — R152 Fecal urgency: Secondary | ICD-10-CM

## 2018-03-06 DIAGNOSIS — R159 Full incontinence of feces: Secondary | ICD-10-CM | POA: Diagnosis not present

## 2018-03-06 MED ORDER — SUPREP BOWEL PREP KIT 17.5-3.13-1.6 GM/177ML PO SOLN: 1.0000 | ORAL | 0 refills | Status: DC

## 2018-03-06 NOTE — Progress Notes (Signed)
Patient ID: Stacy Davis, female   DOB: 05/22/1946, 72 y.o.   MRN: [id number] HPI: Stacy Davis is a 72 year old female with a past medical history of GERD, hypertension, hyperlipidemia, hypothyroidism anxiety/depressed mood who is seen to evaluate episodes of fecal incontinence.  She is here today with her husband.  She reports that over the last 3 months perhaps longer she has had episodes of watery urgent stools.  These can be associated with lower abdominal crampy type pain which resolves after bowel movement.  She has had episodes where the stools are so urgent that she does not make it to the restroom.  These episodes are distressing for her for that reason.  She estimates these can occur as frequently as once per week.  Between bowel movements tend to be more normal and occur once sometimes 2 times a day.  She reports previously she was much more constipated than she has been recently.  She has not seen blood in her stool or melena.  She denies trouble with abdominal gas and bloating.  Her appetite has been good.  No nausea or vomiting.  No dysphagia or odynophagia.  She takes omeprazole 40 mg daily which is a stable dose for her.  She denies heartburn.  Within the last couple of months she has been started on Zoloft for anxiety and depressed mood.  She takes 50 mg daily and they found this to be very helpful.  She also been off of her levothyroxine and this is also been restarted within the last 2 months by primary care.  She had a colonoscopy 12 years ago with Dr. Olevia Davis performed on 01/10/2006.  This was normal at that time without polyps.  She does not recall a family history of colon cancer though the chart lists that her paternal grandfather had colon cancer.  Past Medical History:  Diagnosis Date  . Anxiety   . Arthritis    ?OA vs Rheum (established with Stacy Davis pending blood work)  . Articular cartilage disorder of shoulder 04/2012   right  . Back pain    h/o DDD since  2010  . Dental crowns present    also dental caps  . Diabetes mellitus    NIDDM  . Fecal incontinence   . GERD (gastroesophageal reflux disease)   . Headache(784.0)    occasional  . History of skin cancer   . Hyperlipidemia    no current med.  . Hypertension   . Hyperthyroidism   . Hypothyroid   . Impingement syndrome of right shoulder 04/2012  . Memory impairment    dementia- worsenign 12/2016 with medication non compliance and delusions.    . Right rotator cuff tear 05/15/2012  . Rotator cuff rupture 04/2012   right  . Seizure ([place])   . Von Willebrand disease ([place])     Past Surgical History:  Procedure Laterality Date  . APPENDECTOMY    . BREAST SURGERY     hematoma evacuation due to trauma  . CARDIAC CATHETERIZATION  03/06/2001  . CESAREAN SECTION     x 2  . COLONOSCOPY  01/20/2006 per patient  . FRACTURE SURGERY    . KNEE ARTHROSCOPY Right 12/01/2014   Procedure: ARTHROSCOPY KNEE,partial medial menisectomy and plica excision;  Surgeon: Stacy Knows, MD;  Location: [facility];  Service: Orthopedics;  Laterality: Right;  . LUMBAR LAMINECTOMY/DECOMPRESSION MICRODISCECTOMY  03/01/2009   right L4-5; arthrodesis L4-5  . ORIF WRIST FRACTURE     left  . SHOULDER ARTHROSCOPY WITH ROTATOR  CUFF REPAIR AND SUBACROMIAL DECOMPRESSION  05/15/2012   Procedure: SHOULDER ARTHROSCOPY WITH ROTATOR CUFF REPAIR AND SUBACROMIAL DECOMPRESSION;  Surgeon: Stacy Bridge, MD;  Location: [facility];  Service: Orthopedics;  Laterality: Right;  RIGHT ARTHROSCOPY SHOULDER DEBRIDEMENT LIMITED, ARTHROSCOPY SHOULDER DECOMPRESSION SUBACROMIAL PARTIAL ACROMIOPLASTY WITH CORACOACROMIAL RELEASE, ARTHROSCOPY SHOULDER WITH ROTATOR CUFF REPAIR  . TUBAL LIGATION      Outpatient Medications Prior to Visit  Medication Sig Dispense Refill  . Choline Dihydrogen Citrate (CHOLINE CITRATE PO) Take 1 tablet by mouth daily.    Marland Kitchen levothyroxine (SYNTHROID, LEVOTHROID) 88 MCG tablet TAKE 1 TABLET BY MOUTH  ONCE A DAY 30 tablet 3  . omeprazole (PRILOSEC) 40 MG capsule TAKE 1 CAPSULE BY MOUTH ONCE DAILY 90 capsule 3  . sertraline (ZOLOFT) 50 MG tablet TAKE 1 TABLET BY MOUTH ONCE A DAY 30 tablet 2  . TURMERIC PO Take 1 capsule by mouth daily.    . fenofibrate 160 MG tablet Take 1 tablet (160 mg total) by mouth daily. 30 tablet 3  . Liniments (SALONPAS PAIN RELIEF PATCH EX) Apply 1 patch topically as needed (back pain).    Marland Kitchen lisinopril (PRINIVIL,ZESTRIL) 2.5 MG tablet TAKE 1 TABLET BY MOUTH ONCE A DAY 90 tablet 0  . meloxicam (MOBIC) 15 MG tablet Take 1 tablet (15 mg total) by mouth daily. (Patient taking differently: Take 15 mg by mouth as needed. )    . metoprolol tartrate (LOPRESSOR) 25 MG tablet TAKE 1 TABLET BY MOUTH TWICE A DAY 60 tablet 5  . nystatin (MYCOSTATIN/NYSTOP) powder Apply topically 4 (four) times daily. 15 g 0  . nystatin cream (MYCOSTATIN) Apply 1 application topically 2 (two) times daily. 30 g 0  . pantoprazole (PROTONIX) 40 MG tablet Take 40 mg by mouth every morning.      No facility-administered medications prior to visit.     Allergies  Allergen Reactions  . Tramadol Other (See Comments)    CHANGES IN MEMORY    Family History  Problem Relation Age of Onset  . Dementia Mother   . Arthritis Mother   . Cancer Father        unknown primary  . Heart disease Father   . Thyroid disease Sister   . Cancer Sister        brain tumor  . Dementia Sister   . COPD Brother   . Cancer Brother        bone cancer  . Colon cancer Paternal Grandfather   . Breast cancer Neg Hx     Social History   Tobacco Use  . Smoking status: Never Smoker  . Smokeless tobacco: Never Used  Substance Use Topics  . Alcohol use: No    Alcohol/week: 0.0 standard drinks  . Drug use: No    ROS: As per history of present illness, otherwise negative  BP 128/76   Pulse 68   Ht 5\' 3"  (1.6 m)   Wt 158 lb 6 oz (71.8 kg)   BMI 28.05 kg/m  Constitutional: Well-developed and well-nourished.  No distress. HEENT: Normocephalic and atraumatic. Marland Kitchen Conjunctivae are normal.  No scleral icterus. Neck: Neck supple. Trachea midline. Cardiovascular: Normal rate, regular rhythm and intact distal pulses.  Pulmonary/chest: Effort normal and breath sounds normal. No wheezing, rales or rhonchi. Abdominal: Soft, nontender, nondistended. Bowel sounds active throughout.  Extremities: no clubbing, cyanosis, or edema Neurological: Alert and oriented to person place and time. Skin: Skin is warm and dry.  Psychiatric: Normal mood and affect. Behavior is  normal.  RELEVANT LABS AND IMAGING: CBC    Component Value Date/Time   WBC 7.3 01/31/2018 1530   RBC 4.77 01/31/2018 1530   HGB 12.8 01/31/2018 1530   HGB 12.1 02/23/2009 1156   HCT 40.3 01/31/2018 1530   HCT 34.7 (L) 02/23/2009 1156   PLT 192 01/31/2018 1530   PLT 273 02/23/2009 1156   MCV 84.5 01/31/2018 1530   MCV 80 (L) 02/23/2009 1156   MCH 26.8 01/31/2018 1530   MCHC 31.8 01/31/2018 1530   RDW 14.8 01/31/2018 1530   RDW 14.9 (H) 02/23/2009 1156   LYMPHSABS 2,100 09/03/2016 1645   LYMPHSABS 2.5 02/23/2009 1156   MONOABS 588 09/03/2016 1645   EOSABS 84 09/03/2016 1645   EOSABS 0.2 02/23/2009 1156   BASOSABS 0 09/03/2016 1645   BASOSABS 0.1 02/23/2009 1156    CMP     Component Value Date/Time   NA 143 01/31/2018 1530   K 3.4 (L) 01/31/2018 1530   CL 108 01/31/2018 1530   CO2 21 (L) 01/31/2018 1530   GLUCOSE 148 (H) 01/31/2018 1530   BUN 15 01/31/2018 1530   CREATININE 1.04 (H) 01/31/2018 1530   CREATININE 0.85 10/08/2017 1428   CALCIUM 9.6 01/31/2018 1530   PROT 6.9 01/31/2018 1530   ALBUMIN 4.1 01/31/2018 1530   AST 27 01/31/2018 1530   ALT 16 01/31/2018 1530   ALKPHOS 80 01/31/2018 1530   BILITOT 0.6 01/31/2018 1530   GFRNONAA 53 (L) 01/31/2018 1530   GFRNONAA 69 10/08/2017 1428   GFRAA >60 01/31/2018 1530   GFRAA 80 10/08/2017 1428    ASSESSMENT/PLAN: 72 year old female with a past medical history of GERD,  hypertension, hyperlipidemia, hypothyroidism anxiety/depressed mood who is seen to evaluate episodes of fecal incontinence.    1. CRC screening/fecal incontinence with loose stools --I recommended that we perform a screening colonoscopy to exclude structural abnormality to explain her loose stool and fecal incontinence episodes.  She is due for screening at this time.  We discussed the risk, benefits and alternatives and she is agreeable and wishes to proceed.  Also need to consider whether she could be having overflow diarrhea, should these episodes be triggered by constipation?.  I asked that she keep a log of her bowel movements on a daily basis for a month or 2 so that we can try to determine a pattern.  Further treatment recommendations based on findings of colonoscopy.    OA:CZYSA, Lonie Peak, Pa-c [address] [place], [place] [address]

## 2018-03-06 NOTE — Patient Instructions (Signed)
You have been scheduled for a colonoscopy. Please follow written instructions given to you at your visit today.  Please pick up your prep supplies at the pharmacy within the next 1-3 days. If you use inhalers (even only as needed), please bring them with you on the day of your procedure. Your physician has requested that you go to www.startemmi.com and enter the access code given to you at your visit today. This web site gives a general overview about your procedure. However, you should still follow specific instructions given to you by our office regarding your preparation for the procedure.  If you are age 44 or older, your body mass index should be between 23-30. Your Body mass index is 28.05 kg/m. If this is out of the aforementioned range listed, please consider follow up with your Primary Care Provider.  If you are age 13 or younger, your body mass index should be between 19-25. Your Body mass index is 28.05 kg/m. If this is out of the aformentioned range listed, please consider follow up with your Primary Care Provider.

## 2018-03-19 ENCOUNTER — Other Ambulatory Visit: Payer: Self-pay | Admitting: Physician Assistant

## 2018-03-19 DIAGNOSIS — F320 Major depressive disorder, single episode, mild: Secondary | ICD-10-CM

## 2018-03-23 ENCOUNTER — Encounter: Payer: Self-pay | Admitting: Internal Medicine

## 2018-04-06 ENCOUNTER — Encounter: Payer: Self-pay | Admitting: Internal Medicine

## 2018-04-06 ENCOUNTER — Ambulatory Visit (AMBULATORY_SURGERY_CENTER): Payer: PPO | Admitting: Internal Medicine

## 2018-04-06 VITALS — BP 125/71 | HR 66 | Temp 98.0°F | Resp 16 | Ht 63.0 in | Wt 158.0 lb

## 2018-04-06 DIAGNOSIS — D122 Benign neoplasm of ascending colon: Secondary | ICD-10-CM

## 2018-04-06 DIAGNOSIS — D124 Benign neoplasm of descending colon: Secondary | ICD-10-CM

## 2018-04-06 DIAGNOSIS — Z1211 Encounter for screening for malignant neoplasm of colon: Secondary | ICD-10-CM | POA: Diagnosis not present

## 2018-04-06 MED ORDER — CLOTRIMAZOLE 1 % EX CREA: 1.0000 "application " | TOPICAL_CREAM | Freq: Two times a day (BID) | CUTANEOUS | 0 refills | Status: DC

## 2018-04-06 NOTE — Progress Notes (Signed)
Called to room to assist during endoscopic procedure.  Patient ID and intended procedure confirmed with present staff. Received instructions for my participation in the procedure from the performing physician.  

## 2018-04-06 NOTE — Progress Notes (Signed)
Patient ID: Stacy Davis, female   DOB: [birth year], 72 y.o.   MRN: [id number]  History provided by husband, Trilby Drummer.  Patient has dementia and unable to accurately provide history.

## 2018-04-06 NOTE — Op Note (Addendum)
[facility] Patient Name: Stacy Davis Procedure Date: 04/06/2018 3:50 PM MRN: [id number] Endoscopist: Jerene Bears , MD Age: 72 Referring MD:  Date of Birth: January 28, 1946 Gender: Female Account #: [id number] Procedure:                Colonoscopy Indications:              Screening for colorectal malignant neoplasm,                            incidental fecal incontinence Medicines:                Monitored Anesthesia Care Procedure:                Pre-Anesthesia Assessment:                           - Prior to the procedure, a History and Physical                            was performed, and patient medications and                            allergies were reviewed. The patient's tolerance of                            previous anesthesia was also reviewed. The risks                            and benefits of the procedure and the sedation                            options and risks were discussed with the patient.                            All questions were answered, and informed consent                            was obtained. Prior Anticoagulants: The patient has                            taken no previous anticoagulant or antiplatelet                            agents. ASA Grade Assessment: III - A patient with                            severe systemic disease. After reviewing the risks                            and benefits, the patient was deemed in                            satisfactory condition to undergo the procedure.  After obtaining informed consent, the colonoscope                            was passed under direct vision. Throughout the                            procedure, the patient's blood pressure, pulse, and                            oxygen saturations were monitored continuously. The                            Colonoscope was introduced through the anus and                            advanced to the cecum, identified  by appendiceal                            orifice and ileocecal valve. The colonoscopy was                            performed without difficulty. The patient tolerated                            the procedure well. The quality of the bowel                            preparation was good. The ileocecal valve,                            appendiceal orifice, and rectum were photographed. Scope In: 4:08:48 PM Scope Out: 4:24:05 PM Scope Withdrawal Time: 0 hours 11 minutes 58 seconds  Total Procedure Duration: 0 hours 15 minutes 17 seconds  Findings:                 The digital rectal exam was normal. Tinea cruris                            bilateral inguinal canal.                           Two sessile polyps were found in the ascending                            colon. The polyps were 5 to 6 mm in size. These                            polyps were removed with a cold snare. Resection                            and retrieval were complete.                           A 6 mm polyp was found in the descending  colon. The                            polyp was sessile. The polyp was removed with a                            cold snare. Resection and retrieval were complete.                           The exam was otherwise without abnormality on                            direct and retroflexion views. Complications:            No immediate complications. Estimated Blood Loss:     Estimated blood loss was minimal. Impression:               - Two 5 to 6 mm polyps in the ascending colon,                            removed with a cold snare. Resected and retrieved.                           - One 6 mm polyp in the descending colon, removed                            with a cold snare. Resected and retrieved.                           - The examination was otherwise normal on direct                            and retroflexion views. Recommendation:           - Patient has a contact number available for                             emergencies. The signs and symptoms of potential                            delayed complications were discussed with the                            patient. Return to normal activities tomorrow.                            Written discharge instructions were provided to the                            patient.                           - Resume previous diet.                           - Continue present medications.                           -  Begin clotrimazole twice daily to the skin in the                            inguinal canal.                           - Begin Benefiber 1 tablespoon twice daily.                           - Await pathology results.                           - Repeat colonoscopy for surveillance based on                            pathology results. Jerene Bears, MD 04/06/2018 4:30:06 PM This report has been signed electronically.

## 2018-04-06 NOTE — Progress Notes (Signed)
A/ox3 pleased with MAC, report to RN 

## 2018-04-06 NOTE — Patient Instructions (Addendum)
Continue present medications. Begin Clotrimazole twice daily to the skin in the inguinal canal. Please read handouts on polyps.     YOU HAD AN ENDOSCOPIC PROCEDURE TODAY AT [facility]:   Refer to the procedure report that was given to you for any specific questions about what was found during the examination.  If the procedure report does not answer your questions, please call your gastroenterologist to clarify.  If you requested that your care partner not be given the details of your procedure findings, then the procedure report has been included in a sealed envelope for you to review at your convenience later.  YOU SHOULD EXPECT: Some feelings of bloating in the abdomen. Passage of more gas than usual.  Walking can help get rid of the air that was put into your GI tract during the procedure and reduce the bloating. If you had a lower endoscopy (such as a colonoscopy or flexible sigmoidoscopy) you may notice spotting of blood in your stool or on the toilet paper. If you underwent a bowel prep for your procedure, you may not have a normal bowel movement for a few days.  Please Note:  You might notice some irritation and congestion in your nose or some drainage.  This is from the oxygen used during your procedure.  There is no need for concern and it should clear up in a day or so.  SYMPTOMS TO REPORT IMMEDIATELY:   Following lower endoscopy (colonoscopy or flexible sigmoidoscopy):  Excessive amounts of blood in the stool  Significant tenderness or worsening of abdominal pains  Swelling of the abdomen that is new, acute  Fever of 100F or higher   For urgent or emergent issues, a gastroenterologist can be reached at any hour by calling [phone].   DIET:  We do recommend a small meal at first, but then you may proceed to your regular diet.  Drink plenty of fluids but you should avoid alcoholic beverages for 24 hours.  ACTIVITY:  You should plan to take it easy for  the rest of today and you should NOT DRIVE or use heavy machinery until tomorrow (because of the sedation medicines used during the test).    FOLLOW UP: Our staff will call the number listed on your records the next business day following your procedure to check on you and address any questions or concerns that you may have regarding the information given to you following your procedure. If we do not reach you, we will leave a message.  However, if you are feeling well and you are not experiencing any problems, there is no need to return our call.  We will assume that you have returned to your regular daily activities without incident.  If any biopsies were taken you will be contacted by phone or by letter within the next 1-3 weeks.  Please call us at [phone] if you have not heard about the biopsies in 3 weeks.    SIGNATURES/CONFIDENTIALITY: You and/or your care partner have signed paperwork which will be entered into your electronic medical record.  These signatures attest to the fact that that the information above on your After Visit Summary has been reviewed and is understood.  Full responsibility of the confidentiality of this discharge information lies with you and/or your care-partner.

## 2018-04-07 ENCOUNTER — Telehealth: Payer: Self-pay

## 2018-04-07 NOTE — Telephone Encounter (Signed)
  Follow up Call-  Call back number 04/06/2018  Post procedure Call Back phone  # [phone]  Speak with Stacy Davis  Permission to leave phone message Yes  Some recent data might be hidden     Patient questions:  Do you have a fever, pain , or abdominal swelling? No. Pain Score  0 *  Have you tolerated food without any problems? Yes.    Have you been able to return to your normal activities? Yes.    Do you have any questions about your discharge instructions: Diet   No. Medications  No. Follow up visit  No.  Do you have questions or concerns about your Care? No.  Actions: * If pain score is 4 or above: No action needed, pain <4.

## 2018-04-11 ENCOUNTER — Emergency Department (HOSPITAL_COMMUNITY): Payer: PPO

## 2018-04-11 ENCOUNTER — Encounter (HOSPITAL_COMMUNITY): Payer: Self-pay | Admitting: Emergency Medicine

## 2018-04-11 ENCOUNTER — Other Ambulatory Visit: Payer: Self-pay

## 2018-04-11 ENCOUNTER — Observation Stay (HOSPITAL_COMMUNITY)
Admission: EM | Admit: 2018-04-11 | Discharge: 2018-04-13 | Disposition: A | Discharge status: Home / Self Care | Payer: PPO | Attending: Internal Medicine | Admitting: Internal Medicine

## 2018-04-11 DIAGNOSIS — D6800 Von Willebrand disease, unspecified: Secondary | ICD-10-CM | POA: Diagnosis present

## 2018-04-11 DIAGNOSIS — I471 Supraventricular tachycardia: Secondary | ICD-10-CM | POA: Insufficient documentation

## 2018-04-11 DIAGNOSIS — W1830XA Fall on same level, unspecified, initial encounter: Secondary | ICD-10-CM | POA: Insufficient documentation

## 2018-04-11 DIAGNOSIS — K219 Gastro-esophageal reflux disease without esophagitis: Secondary | ICD-10-CM | POA: Diagnosis not present

## 2018-04-11 DIAGNOSIS — R0602 Shortness of breath: Secondary | ICD-10-CM | POA: Diagnosis not present

## 2018-04-11 DIAGNOSIS — R55 Syncope and collapse: Secondary | ICD-10-CM

## 2018-04-11 DIAGNOSIS — F419 Anxiety disorder, unspecified: Secondary | ICD-10-CM | POA: Diagnosis not present

## 2018-04-11 DIAGNOSIS — R404 Transient alteration of awareness: Secondary | ICD-10-CM | POA: Diagnosis not present

## 2018-04-11 DIAGNOSIS — E860 Dehydration: Secondary | ICD-10-CM | POA: Insufficient documentation

## 2018-04-11 DIAGNOSIS — R413 Other amnesia: Secondary | ICD-10-CM | POA: Diagnosis present

## 2018-04-11 DIAGNOSIS — S066X0A Traumatic subarachnoid hemorrhage without loss of consciousness, initial encounter: Secondary | ICD-10-CM | POA: Diagnosis not present

## 2018-04-11 DIAGNOSIS — R410 Disorientation, unspecified: Secondary | ICD-10-CM | POA: Diagnosis not present

## 2018-04-11 DIAGNOSIS — Y92090 Kitchen in other non-institutional residence as the place of occurrence of the external cause: Secondary | ICD-10-CM | POA: Insufficient documentation

## 2018-04-11 DIAGNOSIS — E039 Hypothyroidism, unspecified: Secondary | ICD-10-CM | POA: Insufficient documentation

## 2018-04-11 DIAGNOSIS — I609 Nontraumatic subarachnoid hemorrhage, unspecified: Secondary | ICD-10-CM

## 2018-04-11 DIAGNOSIS — E119 Type 2 diabetes mellitus without complications: Secondary | ICD-10-CM | POA: Insufficient documentation

## 2018-04-11 DIAGNOSIS — Z79899 Other long term (current) drug therapy: Secondary | ICD-10-CM | POA: Diagnosis not present

## 2018-04-11 DIAGNOSIS — F0390 Unspecified dementia without behavioral disturbance: Secondary | ICD-10-CM | POA: Diagnosis not present

## 2018-04-11 DIAGNOSIS — J849 Interstitial pulmonary disease, unspecified: Secondary | ICD-10-CM | POA: Insufficient documentation

## 2018-04-11 DIAGNOSIS — Z66 Do not resuscitate: Secondary | ICD-10-CM | POA: Diagnosis not present

## 2018-04-11 DIAGNOSIS — E785 Hyperlipidemia, unspecified: Secondary | ICD-10-CM | POA: Insufficient documentation

## 2018-04-11 DIAGNOSIS — Z888 Allergy status to other drugs, medicaments and biological substances status: Secondary | ICD-10-CM | POA: Diagnosis not present

## 2018-04-11 DIAGNOSIS — Z85828 Personal history of other malignant neoplasm of skin: Secondary | ICD-10-CM | POA: Insufficient documentation

## 2018-04-11 DIAGNOSIS — R262 Difficulty in walking, not elsewhere classified: Secondary | ICD-10-CM | POA: Insufficient documentation

## 2018-04-11 DIAGNOSIS — F329 Major depressive disorder, single episode, unspecified: Secondary | ICD-10-CM | POA: Insufficient documentation

## 2018-04-11 DIAGNOSIS — K760 Fatty (change of) liver, not elsewhere classified: Secondary | ICD-10-CM | POA: Insufficient documentation

## 2018-04-11 DIAGNOSIS — I10 Essential (primary) hypertension: Secondary | ICD-10-CM | POA: Diagnosis not present

## 2018-04-11 DIAGNOSIS — F32A Depression, unspecified: Secondary | ICD-10-CM | POA: Diagnosis present

## 2018-04-11 DIAGNOSIS — R4020 Unspecified coma: Secondary | ICD-10-CM | POA: Diagnosis not present

## 2018-04-11 LAB — COMPREHENSIVE METABOLIC PANEL
ALT: UNDETERMINED U/L (ref 0–44)
AST: 43 U/L — ABNORMAL HIGH (ref 15–41)
Albumin: 4.3 g/dL (ref 3.5–5.0)
Alkaline Phosphatase: 85 U/L (ref 38–126)
Anion gap: 18 — ABNORMAL HIGH (ref 5–15)
BUN: 15 mg/dL (ref 8–23)
CO2: 13 mmol/L — ABNORMAL LOW (ref 22–32)
Calcium: 9.4 mg/dL (ref 8.9–10.3)
Chloride: 108 mmol/L (ref 98–111)
Creatinine, Ser: 0.82 mg/dL (ref 0.44–1.00)
GFR calc Af Amer: >60 mL/min (ref >60)
GFR calc non Af Amer: >60 mL/min (ref >60)
Glucose, Bld: 112 mg/dL — ABNORMAL HIGH (ref 70–99)
Potassium: 5.6 mmol/L — ABNORMAL HIGH (ref 3.5–5.1)
Sodium: 139 mmol/L (ref 135–145)
Total Bilirubin: UNDETERMINED mg/dL (ref 0.3–1.2)
Total Protein: 7.4 g/dL (ref 6.5–8.1)

## 2018-04-11 LAB — CBC WITH DIFFERENTIAL/PLATELET
Abs Immature Granulocytes: 0.08 10*3/uL — ABNORMAL HIGH (ref 0.00–0.07)
Basophils Absolute: 0 10*3/uL (ref 0.0–0.1)
Basophils Relative: 0 %
Eosinophils Absolute: 0.1 10*3/uL (ref 0.0–0.5)
Eosinophils Relative: 1 %
HCT: 43.2 % (ref 36.0–46.0)
Hemoglobin: 12.8 g/dL (ref 12.0–15.0)
Immature Granulocytes: 1 %
Lymphocytes Relative: 15 %
Lymphs Abs: 1.5 10*3/uL (ref 0.7–4.0)
MCH: 25.5 pg — ABNORMAL LOW (ref 26.0–34.0)
MCHC: 29.6 g/dL — ABNORMAL LOW (ref 30.0–36.0)
MCV: 86.2 fL (ref 80.0–100.0)
Monocytes Absolute: 0.5 10*3/uL (ref 0.1–1.0)
Monocytes Relative: 5 %
Neutro Abs: 7.7 10*3/uL (ref 1.7–7.7)
Neutrophils Relative %: 78 %
Platelets: 270 10*3/uL (ref 150–400)
RBC: 5.01 MIL/uL (ref 3.87–5.11)
RDW: 14 % (ref 11.5–15.5)
WBC: 9.8 10*3/uL (ref 4.0–10.5)
nRBC: 0 % (ref 0.0–0.2)

## 2018-04-11 LAB — I-STAT CHEM 8, ED
BUN: 19 mg/dL (ref 8–23)
Calcium, Ion: 1.02 mmol/L — ABNORMAL LOW (ref 1.15–1.40)
Chloride: 111 mmol/L (ref 98–111)
Creatinine, Ser: 0.8 mg/dL (ref 0.44–1.00)
Glucose, Bld: 127 mg/dL — ABNORMAL HIGH (ref 70–99)
HCT: 38 % (ref 36.0–46.0)
Hemoglobin: 12.9 g/dL (ref 12.0–15.0)
Potassium: 5.1 mmol/L (ref 3.5–5.1)
Sodium: 138 mmol/L (ref 135–145)
TCO2: 22 mmol/L (ref 22–32)

## 2018-04-11 LAB — I-STAT TROPONIN, ED: Troponin i, poc: 0 ng/mL (ref 0.00–0.08)

## 2018-04-11 LAB — APTT: aPTT: 32 seconds (ref 24–36)

## 2018-04-11 LAB — PROTIME-INR
INR: 1.02
Prothrombin Time: 13.3 seconds (ref 11.4–15.2)

## 2018-04-11 LAB — SALICYLATE LEVEL: Salicylate Lvl: <7 mg/dL (ref 2.8–30.0)

## 2018-04-11 LAB — ACETAMINOPHEN LEVEL: Acetaminophen (Tylenol), Serum: <10 ug/mL — ABNORMAL LOW (ref 10–30)

## 2018-04-11 LAB — POTASSIUM: Potassium: 3.6 mmol/L (ref 3.5–5.1)

## 2018-04-11 MED ORDER — PANTOPRAZOLE SODIUM 40 MG PO TBEC
40.0000 mg | DELAYED_RELEASE_TABLET | Freq: Every day | ORAL | Status: DC
  Administered 2018-04-12 – 2018-04-13 (×2): 40 mg via ORAL
  Filled 2018-04-11 (×2): qty 1

## 2018-04-11 MED ORDER — CHOLINE CITRATE 650 MG PO TABS: ORAL_TABLET | Freq: Every day | ORAL | Status: DC

## 2018-04-11 MED ORDER — CLOTRIMAZOLE 1 % EX CREA
1.0000 "application " | TOPICAL_CREAM | Freq: Two times a day (BID) | CUTANEOUS | Status: DC
  Administered 2018-04-12 – 2018-04-13 (×3): 1 via TOPICAL
  Filled 2018-04-11: qty 15

## 2018-04-11 MED ORDER — SERTRALINE HCL 50 MG PO TABS
50.0000 mg | ORAL_TABLET | Freq: Every day | ORAL | Status: DC
  Administered 2018-04-12: 50 mg via ORAL
  Filled 2018-04-11: qty 1

## 2018-04-11 MED ORDER — CALCIUM GLUCONATE-NACL 1-0.675 GM/50ML-% IV SOLN: 1.0000 g | Freq: Once | INTRAVENOUS | Status: DC

## 2018-04-11 MED ORDER — NYSTATIN 100000 UNIT/GM EX POWD: Freq: Every day | CUTANEOUS | Status: DC | PRN

## 2018-04-11 MED ORDER — SODIUM CHLORIDE 0.9 % IV SOLN
INTRAVENOUS | Status: DC
  Administered 2018-04-12 (×2): via INTRAVENOUS

## 2018-04-11 MED ORDER — LEVOTHYROXINE SODIUM 88 MCG PO TABS
88.0000 ug | ORAL_TABLET | Freq: Every day | ORAL | Status: DC
  Administered 2018-04-12: 88 ug via ORAL
  Filled 2018-04-11: qty 1

## 2018-04-11 MED ORDER — SODIUM BICARBONATE 8.4 % IV SOLN: 50.0000 meq | Freq: Once | INTRAVENOUS | Status: DC

## 2018-04-11 MED ORDER — ACETAMINOPHEN 325 MG PO TABS
650.0000 mg | ORAL_TABLET | Freq: Four times a day (QID) | ORAL | Status: DC | PRN
  Administered 2018-04-13: 650 mg via ORAL
  Filled 2018-04-11: qty 2

## 2018-04-11 MED ORDER — OXYCODONE-ACETAMINOPHEN 5-325 MG PO TABS
1.0000 | ORAL_TABLET | ORAL | Status: DC | PRN
  Administered 2018-04-11: 1 via ORAL
  Filled 2018-04-11: qty 1

## 2018-04-11 NOTE — ED Provider Notes (Signed)
[facility] EMERGENCY DEPARTMENT Provider Note   CSN: [id number] Arrival date & time: 04/11/18  1711     History   Chief Complaint Chief Complaint  Patient presents with  . Loss of Consciousness    HPI Stacy Davis is a 72 y.o. female with a past medical history of dementia, von Willebrand's disease, diabetes, anxiety, hypertension, hyperlipidemia, who presents today for evaluation of a syncopal event.  She lives at home with her husband.  Husband heard a loud noise and found patient laying on the floor the kitchen supine.  She was unconscious and had vomited.  From EMS call placed until arrival was approximately 10 minutes and patient was still unconscious with snoring respirations.  No witnessed seizure like activity per EMS Patient gradually came around.  She says her head and everything hurts.  She has been crying the entire right according to EMS.  According to EMS blood sugar was normal.    Patient is unable to provide further history.   HPI  Past Medical History:  Diagnosis Date  . Anxiety   . Arthritis    ?OA vs Rheum (established with Dr. Jefm Bryant pending blood work)  . Articular cartilage disorder of shoulder 04/2012   right  . Back pain    h/o DDD since 2010  . Dementia ([place])   . Dental crowns present    also dental caps  . Diabetes mellitus    NIDDM  . Fecal incontinence   . GERD (gastroesophageal reflux disease)   . Headache(784.0)    occasional  . History of skin cancer   . Hyperlipidemia    no current med.  . Hypertension   . Hyperthyroidism   . Hypothyroid   . Impingement syndrome of right shoulder 04/2012  . Memory impairment    dementia- worsenign 12/2016 with medication non compliance and delusions.    . Right rotator cuff tear 05/15/2012  . Rotator cuff rupture 04/2012   right  . Von Willebrand disease [facility])     Patient Active Problem List   Diagnosis Date Noted  . Syncope 04/11/2018  . Incontinence of feces  01/08/2018  . Depression 10/08/2017  . Memory impairment   . Advance care planning 10/06/2014  . Joint pain 04/15/2013  . Advance directive on file 08/12/2012  . Confusion 06/10/2012  . Right rotator cuff tear 05/15/2012  . Shoulder pain 05/03/2012  . Insomnia 08/13/2011  . 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 HTN (hypertension) 07/18/2011  . HLD (hyperlipidemia) 07/18/2011  . Memory loss 07/18/2011  . Hypothyroid 07/18/2011  . SVT (supraventricular tachycardia) ([place]) 07/18/2011  . Von Willebrand disease ([place]) 07/18/2011  . GERD (gastroesophageal reflux disease) 07/18/2011  . Back pain 07/18/2011  . Fatty liver 07/18/2011    Past Surgical History:  Procedure Laterality Date  . APPENDECTOMY    . BREAST SURGERY     hematoma evacuation due to trauma  . CARDIAC CATHETERIZATION  03/06/2001  . CESAREAN SECTION     x 2  . COLONOSCOPY  01/20/2006 per patient  . FRACTURE SURGERY    . KNEE ARTHROSCOPY Right 12/01/2014   Procedure: ARTHROSCOPY KNEE,partial medial menisectomy and plica excision;  Surgeon: Hessie Knows, MD;  Location: [facility] ORS;  Service: Orthopedics;  Laterality: Right;  . LUMBAR LAMINECTOMY/DECOMPRESSION MICRODISCECTOMY  03/01/2009   right L4-5; arthrodesis L4-5  . ORIF WRIST FRACTURE     left  . SHOULDER ARTHROSCOPY WITH ROTATOR CUFF REPAIR AND SUBACROMIAL DECOMPRESSION  05/15/2012  Procedure: SHOULDER ARTHROSCOPY WITH ROTATOR CUFF REPAIR AND SUBACROMIAL DECOMPRESSION;  Surgeon: Johnny Bridge, MD;  Location: [facility];  Service: Orthopedics;  Laterality: Right;  RIGHT ARTHROSCOPY SHOULDER DEBRIDEMENT LIMITED, ARTHROSCOPY SHOULDER DECOMPRESSION SUBACROMIAL PARTIAL ACROMIOPLASTY WITH CORACOACROMIAL RELEASE, ARTHROSCOPY SHOULDER WITH ROTATOR CUFF REPAIR  . TUBAL LIGATION       OB History   None      Home Medications    Prior to Admission medications   Medication Sig Start Date End Date Taking?  Authorizing Provider  Choline Dihydrogen Citrate (CHOLINE CITRATE PO) Take 1 tablet by mouth daily after supper.    Yes [provider]  levothyroxine (SYNTHROID, LEVOTHROID) 88 MCG tablet TAKE 1 TABLET BY MOUTH ONCE A DAY Patient taking differently: Take 88 mcg by mouth daily after supper.  11/28/17  Yes Susy Frizzle, MD  nystatin (NYSTATIN) powder Apply topically daily as needed (under belly rash).   Yes [provider]  nystatin cream (MYCOSTATIN) Apply 1 application topically daily as needed (under belly rash).   Yes [provider]  omeprazole (PRILOSEC) 40 MG capsule TAKE 1 CAPSULE BY MOUTH ONCE DAILY Patient taking differently: Take 40 mg by mouth daily after supper.  09/08/17  Yes Susy Frizzle, MD  OVER THE COUNTER MEDICATION Take 2 tablets by mouth 2 (two) times daily as needed (pain). "Back and Body" over the counter   Yes [provider]  sertraline (ZOLOFT) 50 MG tablet TAKE 1 TABLET BY MOUTH ONCE A DAY Patient taking differently: Take 50 mg by mouth daily after supper.  03/19/18  Yes Susy Frizzle, MD  TURMERIC PO Take 1 capsule by mouth daily.   Yes [provider]  clotrimazole (CLOTRIMAZOLE GRX) 1 % cream Apply 1 application topically 2 (two) times daily. 04/06/18   Pyrtle, Lajuan Lines, MD    Family History Family History  Problem Relation Age of Onset  . Dementia Mother   . Arthritis Mother   . Cancer Father        unknown primary  . Heart disease Father   . Thyroid disease Sister   . Cancer Sister        brain tumor  . Dementia Sister   . COPD Brother   . Cancer Brother        bone cancer  . Colon cancer Paternal Grandfather   . Breast cancer Neg Hx     Social History Social History   Tobacco Use  . Smoking status: Never Smoker  . Smokeless tobacco: Never Used  Substance Use Topics  . Alcohol use: No    Alcohol/week: 0.0 standard drinks  . Drug use: No     Allergies   Tramadol   Review of  Systems Review of Systems  Unable to perform ROS: Mental status change     Physical Exam Updated Vital Signs BP 120/75 (BP Location: Right Arm)   Pulse 84   Temp (!) 97.5 F (36.4 C) (Oral)   Resp 20   Ht 5\' 3"  (1.6 m)   Wt 71 kg   SpO2 100%   BMI 27.73 kg/m   Physical Exam  Constitutional: She appears well-developed and well-nourished. No distress.  HENT:  Head: Normocephalic and atraumatic.  Eyes: Conjunctivae are normal. Right eye exhibits no discharge. Left eye exhibits no discharge. No scleral icterus.  Patient unable to cooperate in ROM testing.  Neck:  ROM not tested  Cardiovascular: Normal rate, regular rhythm, normal heart sounds, intact distal pulses and  normal pulses.  Pulses:      Radial pulses are 2+ on the right side, and 2+ on the left side.       Dorsalis pedis pulses are 2+ on the right side, and 2+ on the left side.       Posterior tibial pulses are 2+ on the right side, and 2+ on the left side.  Pulmonary/Chest: Effort normal and breath sounds normal. No stridor. No respiratory distress. She has no wheezes.  Abdominal: Soft. Normal appearance. She exhibits no distension. There is no tenderness.  Musculoskeletal: She exhibits no edema or deformity.  Bilateral arms and legs palpated without obvious deformities or crepitus.  All compartments are soft and easily compressible.  Neurological: She is alert. She exhibits normal muscle tone.  Oriented to person.  Tearful.  Moves all 4 extremities spontaneously. When asked questions more than what is her name she starts crying harder.  She is looking around, looks at me when I speak.   Skin: Skin is warm and dry. She is not diaphoretic.  Psychiatric: She has a normal mood and affect. Her behavior is normal.  Nursing note and vitals reviewed.    ED Treatments / Results  Labs (all labs ordered are listed, but only abnormal results are displayed) Labs Reviewed  CBC WITH DIFFERENTIAL/PLATELET - Abnormal; Notable  for the following components:      Result Value   MCH 25.5 (*)    MCHC 29.6 (*)    Abs Immature Granulocytes 0.08 (*)    All other components within normal limits  COMPREHENSIVE METABOLIC PANEL - Abnormal; Notable for the following components:   Potassium 5.6 (*)    CO2 13 (*)    Glucose, Bld 112 (*)    AST 43 (*)    Anion gap 18 (*)    All other components within normal limits  ACETAMINOPHEN LEVEL - Abnormal; Notable for the following components:   Acetaminophen (Tylenol), Serum <10 (*)    All other components within normal limits  I-STAT CHEM 8, ED - Abnormal; Notable for the following components:   Glucose, Bld 127 (*)    Calcium, Ion 1.02 (*)    All other components within normal limits  APTT  PROTIME-INR  SALICYLATE LEVEL  POTASSIUM  URINALYSIS, ROUTINE W REFLEX MICROSCOPIC  I-STAT TROPONIN, ED    EKG None  Radiology Dg Chest 2 View  Result Date: 04/11/2018 CLINICAL DATA:  Chest pain and shortness of breath. Severe anxiety. Loss of consciousness for about 10 minutes today, found on the kitchen floor supine and snoring. EXAM: CHEST - 2 VIEW COMPARISON:  01/31/2018. FINDINGS: Enlarged cardiac silhouette with improvement. Decreased prominence of the pulmonary vasculature. Stable mildly prominent interstitial markings with diffuse peribronchial thickening. Mild thoracic spine degenerative changes. No fracture pneumothorax seen. IMPRESSION: 1. No acute abnormality. 2. Improved cardiomegaly and pulmonary vascular congestion. 3. Stable mild chronic bronchitic changes and mild chronic interstitial lung disease. Electronically Signed   By: Claudie Revering M.D.   On: 04/11/2018 18:16   Ct Head Wo Contrast  Result Date: 04/11/2018 CLINICAL DATA:  Loss of consciousness.  Fall. EXAM: CT HEAD WITHOUT CONTRAST CT CERVICAL SPINE WITHOUT CONTRAST TECHNIQUE: Multidetector CT imaging of the head and cervical spine was performed following the standard protocol without intravenous contrast.  Multiplanar CT image reconstructions of the cervical spine were also generated. COMPARISON:  01/31/2018. FINDINGS: CT HEAD FINDINGS Brain: Multiple subtle foci of subarachnoid hemorrhage are identified. Tiny focus identified in the high right parietal lobe  near the vertex (image 26/series 3 and sagittal image 29/series 6). There is also a trace amount of subarachnoid hemorrhage in the left frontal lobe (see image 18 of axial series 3 and sagittal image 40 of series 6. No abnormal extra-axial fluid collection. Patchy low attenuation in the deep hemispheric and periventricular white matter is nonspecific, but likely reflects chronic microvascular ischemic demyelination. Vascular: No hyperdense vessel or unexpected calcification. Skull: No evidence for fracture. No worrisome lytic or sclerotic lesion. Sinuses/Orbits: The visualized paranasal sinuses and mastoid air cells are clear. Visualized portions of the globes and intraorbital fat are unremarkable. Other: None. CT CERVICAL SPINE FINDINGS Alignment: Straightening of normal cervical lordosis evident. Trace anterolisthesis of C3 on 4 is stable since prior study. Skull base and vertebrae: No evidence for an acute fracture. Soft tissues and spinal canal: No prevertebral fluid or swelling. No visible canal hematoma. Disc levels: Marked loss of disc height with endplate degeneration evident at C4-5, C5-6, C6-7, and C7-T1. The left C4-5 facets are fused in there is diffuse bilateral facet osteoarthritis. Upper chest: Unremarkable. Other: None. IMPRESSION: 1. Trace subarachnoid hemorrhage noted in the high right parietal lobe and anterior left frontal lobe. 2. Chronic small vessel white matter ischemic disease. 3. Advanced degenerative changes in the cervical spine without fracture. Critical Value/emergent results were called by telephone at the time of interpretation on 04/11/2018 at 6:13 pm to [facility] , who verbally acknowledged these results.  Electronically Signed   By: Misty Stanley M.D.   On: 04/11/2018 18:13   Ct Cervical Spine Wo Contrast  Result Date: 04/11/2018 CLINICAL DATA:  Loss of consciousness.  Fall. EXAM: CT HEAD WITHOUT CONTRAST CT CERVICAL SPINE WITHOUT CONTRAST TECHNIQUE: Multidetector CT imaging of the head and cervical spine was performed following the standard protocol without intravenous contrast. Multiplanar CT image reconstructions of the cervical spine were also generated. COMPARISON:  01/31/2018. FINDINGS: CT HEAD FINDINGS Brain: Multiple subtle foci of subarachnoid hemorrhage are identified. Tiny focus identified in the high right parietal lobe near the vertex (image 26/series 3 and sagittal image 29/series 6). There is also a trace amount of subarachnoid hemorrhage in the left frontal lobe (see image 18 of axial series 3 and sagittal image 40 of series 6. No abnormal extra-axial fluid collection. Patchy low attenuation in the deep hemispheric and periventricular white matter is nonspecific, but likely reflects chronic microvascular ischemic demyelination. Vascular: No hyperdense vessel or unexpected calcification. Skull: No evidence for fracture. No worrisome lytic or sclerotic lesion. Sinuses/Orbits: The visualized paranasal sinuses and mastoid air cells are clear. Visualized portions of the globes and intraorbital fat are unremarkable. Other: None. CT CERVICAL SPINE FINDINGS Alignment: Straightening of normal cervical lordosis evident. Trace anterolisthesis of C3 on 4 is stable since prior study. Skull base and vertebrae: No evidence for an acute fracture. Soft tissues and spinal canal: No prevertebral fluid or swelling. No visible canal hematoma. Disc levels: Marked loss of disc height with endplate degeneration evident at C4-5, C5-6, C6-7, and C7-T1. The left C4-5 facets are fused in there is diffuse bilateral facet osteoarthritis. Upper chest: Unremarkable. Other: None. IMPRESSION: 1. Trace subarachnoid hemorrhage  noted in the high right parietal lobe and anterior left frontal lobe. 2. Chronic small vessel white matter ischemic disease. 3. Advanced degenerative changes in the cervical spine without fracture. Critical Value/emergent results were called by telephone at the time of interpretation on 04/11/2018 at 6:13 pm to [facility] , who verbally acknowledged these results. Electronically Signed  By: Misty Stanley M.D.   On: 04/11/2018 18:13    Procedures Procedures (including critical care time) CRITICAL CARE Performed by: Wyn Quaker Total critical care time: 40 minutes Critical care time was exclusive of separately billable procedures and treating other patients. Critical care was necessary to treat or prevent imminent or life-threatening deterioration. Critical care was time spent personally by me on the following activities: development of treatment plan with patient and/or surrogate as well as nursing, discussions with consultants, evaluation of patient's response to treatment, examination of patient, obtaining history from patient or surrogate, ordering and performing treatments and interventions, ordering and review of laboratory studies, ordering and review of radiographic studies, pulse oximetry and re-evaluation of patient's condition.  Subarachnoid hemorrhage   Medications Ordered in ED Medications  acetaminophen (TYLENOL) tablet 650 mg (has no administration in time range)  oxyCODONE-acetaminophen (PERCOCET/ROXICET) 5-325 MG per tablet 1 tablet (has no administration in time range)     Initial Impression / Assessment and Plan / ED Course  I have reviewed the triage vital signs and the nursing notes.  Pertinent labs & imaging results that were available during my care of the patient were reviewed by me and considered in my medical decision making (see chart for details).  Clinical Course as of Apr 11 2318  Sat Apr 11, 2018  1739 Patient's husband arrived and provides  additional history.  Patient has not been sick recently, she has not had any complaints or been coughing.  She had a colonoscopy over a week ago.  She had a similar event a few years ago and a cause was never found.     [EH]  1812 High right parietal and left frontal Subarachnoid hemorrhage    [EH]  1817 Bilateral subarachnoid, hemorrhage, neurology paged   [EH]  [place] Updated patient and husband on results.    [EH]  6301 Spoke with Neurosurgery, who will consult.  Keep blood pressure un   [EH]  1842 RN is exporting EKG.    [EH]  1937 Spoke with Rn, will call to check on labs, not sure if clotted.  Has tried to cath patient but patient did not tolerate, unable to get urine.    [EH]  2116 Called lab about CMP.  They are getting ready to release lab now.    [EH]  2138 Spoke with family medicine teaching service, [facility] is not family medicine.  Will re-page as unassigned.    [EH]  2218 Spoke with Dr. Blaine Hamper who will see patient.    [EH]    Clinical Course User Index [EH] Lorin Glass, PA-C   Patient presents today for evaluation of loss of consciousness.  According to EMS and patient's husband patient was unresponsive for approximately 10 minutes with snoring respirations.  Upon arrival patient was oriented to self only, she was very tearful.  She did not have any evidence of trauma or deformities to extremities.  Based on unwitnessed fall, and age CT head and neck were obtained.  CT neck was normal, however CT head showed trace subarachnoid hemorrhage in the high right parietal lobe and left anterior frontal lobe.  I spoke with neurosurgery Dr. Zada Finders who recommended blood pressure control, and will see the patient in consult.  EKG was obtained and reviewed without evidence of acute abnormalities.  Patient is not anemic.  No Leukocytosis.  Her anion gap was slightly elevated, ASA level not high.  She does not have evidence of infection on CXR, patient  unable to  provide urine.  Patient is afebrile, not tachycardic or tachypnic, do not suspect infection.    This patient was seen as a shared visit with Dr. Laverta Baltimore.    I spoke with Dr. Blaine Hamper who agreed to admit patient.    Patient and her husband were updated multiple times during the ED visit.   Final Clinical Impressions(s) / ED Diagnoses   Final diagnoses:  Subarachnoid hemorrhage [facility])  Syncope and collapse    ED Discharge Orders    None       Ollen Gross 04/11/18 2335    Margette Fast, MD 04/12/18 [phone]

## 2018-04-11 NOTE — ED Notes (Signed)
Pt attempting to use bedpan without success x2

## 2018-04-11 NOTE — H&P (Signed)
History and Physical    Stacy Davis MBW:[id number] DOB: December 31, 1945 DOA: 04/11/2018  Referring MD/NP/PA:   PCP: Susy Frizzle, MD   Patient coming from:  The patient is coming from home.  At baseline, pt is independent for most of ADL.        Chief Complaint: syncope and fall  HPI: Stacy Davis is a 72 y.o. female with medical history significant of hypertension, hyperlipidemia, diet-controlled diabetes, GERD, hypothyroidism, depression, anxiety, dementia, vWF disease, who presents with syncope.  Per patient's husband, he heard loud sound at about 5:00 PM, and found pt in the floor of kitchen supine with snoring. Pt lost consciousness for about 10 min. Pt has nausea and vomited once.  She then complains of headache.  No unilateral weakness, numbness or tingling in extremities. No facial droop or slurred speech.  No chest pain, shortness breath, palpitation.  No cough, fever or chills.  Denies symptoms of UTI. Her husband states that similar symptom happened before no diagnoses.   ED Course: pt was found to have WBC 9.8, INR 1.2, PTT 32, Tylenol level less than 10, salicylate level less than 7, negative urinalysis, renal function normal, temperature 97.5, no tachycardia, oxygen saturation 92-100% on room air.  Chest x-ray negative.  CT head showed subarachnoid hemorrhage.  CT of C-spine is negative for acute bony fracture.  Patient is placed on telemetry bed of observation.  Neurosurgeon was consulted.  Review of Systems:    General: no fevers, chills, no body weight gain, has fatigue HEENT: no blurry vision, hearing changes or sore throat.  Has HA Respiratory: no dyspnea, coughing, wheezing CV: no chest pain, no palpitations GI: has nausea, vomiting, no abdominal pain, diarrhea, constipation GU: no dysuria, burning on urination, increased urinary frequency, hematuria  Ext: no leg edema Neuro: no unilateral weakness, numbness, or tingling, no vision change or hearing loss.  Had fall and syncope. Skin: no rash, no skin tear. MSK: No muscle spasm, no deformity, no limitation of range of movement in spin Heme: No easy bruising.  Travel history: No recent long distant travel.  Allergy:  Allergies  Allergen Reactions  . Tramadol Other (See Comments)    CHANGES IN MEMORY    Past Medical History:  Diagnosis Date  . Anxiety   . Arthritis    ?OA vs Rheum (established with Dr. Jefm Bryant pending blood work)  . Articular cartilage disorder of shoulder 04/2012   right  . Back pain    h/o DDD since 2010  . Dementia ([place])   . Dental crowns present    also dental caps  . Diabetes mellitus    NIDDM  . Fecal incontinence   . GERD (gastroesophageal reflux disease)   . Headache(784.0)    occasional  . History of skin cancer   . Hyperlipidemia    no current med.  . Hypertension   . Hyperthyroidism   . Hypothyroid   . Impingement syndrome of right shoulder 04/2012  . Memory impairment    dementia- worsenign 12/2016 with medication non compliance and delusions.    . Right rotator cuff tear 05/15/2012  . Rotator cuff rupture 04/2012   right  . Von Willebrand disease ([place])     Past Surgical History:  Procedure Laterality Date  . APPENDECTOMY    . BREAST SURGERY     hematoma evacuation due to trauma  . CARDIAC CATHETERIZATION  03/06/2001  . CESAREAN SECTION     x 2  . COLONOSCOPY  01/20/2006 per  patient  . FRACTURE SURGERY    . KNEE ARTHROSCOPY Right 12/01/2014   Procedure: ARTHROSCOPY KNEE,partial medial menisectomy and plica excision;  Surgeon: Hessie Knows, MD;  Location: [facility];  Service: Orthopedics;  Laterality: Right;  . LUMBAR LAMINECTOMY/DECOMPRESSION MICRODISCECTOMY  03/01/2009   right L4-5; arthrodesis L4-5  . ORIF WRIST FRACTURE     left  . SHOULDER ARTHROSCOPY WITH ROTATOR CUFF REPAIR AND SUBACROMIAL DECOMPRESSION  05/15/2012   Procedure: SHOULDER ARTHROSCOPY WITH ROTATOR CUFF REPAIR AND SUBACROMIAL DECOMPRESSION;  Surgeon: Johnny Bridge,  MD;  Location: [place];  Service: Orthopedics;  Laterality: Right;  RIGHT ARTHROSCOPY SHOULDER DEBRIDEMENT LIMITED, ARTHROSCOPY SHOULDER DECOMPRESSION SUBACROMIAL PARTIAL ACROMIOPLASTY WITH CORACOACROMIAL RELEASE, ARTHROSCOPY SHOULDER WITH ROTATOR CUFF REPAIR  . TUBAL LIGATION      Social History:  reports that she has never smoked. She has never used smokeless tobacco. She reports that she does not drink alcohol or use drugs.  Family History:  Family History  Problem Relation Age of Onset  . Dementia Mother   . Arthritis Mother   . Cancer Father        unknown primary  . Heart disease Father   . Thyroid disease Sister   . Cancer Sister        brain tumor  . Dementia Sister   . COPD Brother   . Cancer Brother        bone cancer  . Colon cancer Paternal Grandfather   . Breast cancer Neg Hx      Prior to Admission medications   Medication Sig Start Date End Date Taking? Authorizing Provider  Choline Dihydrogen Citrate (CHOLINE CITRATE PO) Take 1 tablet by mouth daily after supper.    Yes [provider]  levothyroxine (SYNTHROID, LEVOTHROID) 88 MCG tablet TAKE 1 TABLET BY MOUTH ONCE A DAY Patient taking differently: Take 88 mcg by mouth daily after supper.  11/28/17  Yes Susy Frizzle, MD  nystatin (NYSTATIN) powder Apply topically daily as needed (under belly rash).   Yes [provider]  nystatin cream (MYCOSTATIN) Apply 1 application topically daily as needed (under belly rash).   Yes [provider]  omeprazole (PRILOSEC) 40 MG capsule TAKE 1 CAPSULE BY MOUTH ONCE DAILY Patient taking differently: Take 40 mg by mouth daily after supper.  09/08/17  Yes Susy Frizzle, MD  OVER THE COUNTER MEDICATION Take 2 tablets by mouth 2 (two) times daily as needed (pain). "Back and Body" over the counter   Yes [provider]  sertraline (ZOLOFT) 50 MG tablet TAKE 1 TABLET BY MOUTH ONCE A DAY Patient taking differently: Take 50 mg by  mouth daily after supper.  03/19/18  Yes Susy Frizzle, MD  TURMERIC PO Take 1 capsule by mouth daily.   Yes [provider]  clotrimazole (CLOTRIMAZOLE GRX) 1 % cream Apply 1 application topically 2 (two) times daily. 04/06/18   Jerene Bears, MD    Physical Exam: Vitals:   04/11/18 2300 04/12/18 0215 04/12/18 0245 04/12/18 0400  BP:  129/80 (!) 95/59 102/60  Pulse: 93 80  64  Resp: (!) 24 16 18 14   Temp:      TempSrc:      SpO2: 100% 99%  96%  Weight:      Height:       General: Not in acute distress HEENT:       Eyes: PERRL, EOMI, no scleral icterus.       ENT: No discharge from the  ears and nose, no pharynx injection, no tonsillar enlargement.        Neck: No JVD, no bruit, no mass felt. Heme: No neck lymph node enlargement. Cardiac: S1/S2, RRR, No murmurs, No gallops or rubs. Respiratory: No rales, wheezing, rhonchi or rubs. GI: Soft, nondistended, nontender, no rebound pain, no organomegaly, BS present. GU: No hematuria Ext: No pitting leg edema bilaterally. 2+DP/PT pulse bilaterally. Musculoskeletal: No joint deformities, No joint redness or warmth, no limitation of ROM in spin. Skin: No rashes.  Neuro: Alert, oriented X3, cranial nerves II-XII grossly intact, moves all extremities normally. Muscle strength 5/5 in all extremities, sensation to light touch intact. Brachial reflex 2+ bilaterally. Negative Babinski's sign. Psych: Patient is not psychotic, no suicidal or hemocidal ideation.  Labs on Admission: I have personally reviewed following labs and imaging studies  CBC: Recent Labs  Lab 04/11/18 1757 04/11/18 1930  WBC  --  9.8  NEUTROABS  --  7.7  HGB 12.9 12.8  HCT 38.0 43.2  MCV  --  86.2  PLT  --  222   Basic Metabolic Panel: Recent Labs  Lab 04/11/18 1757 04/11/18 1930 04/11/18 2225  NA 138 139  --   K 5.1 5.6* 3.6  CL 111 108  --   CO2  --  13*  --   GLUCOSE 127* 112*  --   BUN 19 15  --   CREATININE 0.80 0.82  --   CALCIUM  --   9.4  --    GFR: Estimated Creatinine Clearance: 60.3 mL/min (by C-G formula based on SCr of 0.82 mg/dL). Liver Function Tests: Recent Labs  Lab 04/11/18 1930  AST 43*  ALT QUANTITY NOT SUFFICIENT, UNABLE TO PERFORM TEST  ALKPHOS 85  BILITOT QUANTITY NOT SUFFICIENT, UNABLE TO PERFORM TEST  PROT 7.4  ALBUMIN 4.3   No results for input(s): LIPASE, AMYLASE in the last 168 hours. No results for input(s): AMMONIA in the last 168 hours. Coagulation Profile: Recent Labs  Lab 04/11/18 1930  INR 1.02   Cardiac Enzymes: No results for input(s): CKTOTAL, CKMB, CKMBINDEX, TROPONINI in the last 168 hours. BNP (last 3 results) No results for input(s): PROBNP in the last 8760 hours. HbA1C: No results for input(s): HGBA1C in the last 72 hours. CBG: Recent Labs  Lab 04/12/18 0247  GLUCAP 140*   Lipid Profile: No results for input(s): CHOL, HDL, LDLCALC, TRIG, CHOLHDL, LDLDIRECT in the last 72 hours. Thyroid Function Tests: No results for input(s): TSH, T4TOTAL, FREET4, T3FREE, THYROIDAB in the last 72 hours. Anemia Panel: No results for input(s): VITAMINB12, FOLATE, FERRITIN, TIBC, IRON, RETICCTPCT in the last 72 hours. Urine analysis:    Component Value Date/Time   COLORURINE STRAW (A) 04/12/2018 0000   APPEARANCEUR CLEAR 04/12/2018 0000   LABSPEC 1.012 04/12/2018 0000   PHURINE 8.0 04/12/2018 0000   GLUCOSEU NEGATIVE 04/12/2018 0000   HGBUR NEGATIVE 04/12/2018 0000   BILIRUBINUR NEGATIVE 04/12/2018 0000   BILIRUBINUR negative 06/10/2012 1631   KETONESUR NEGATIVE 04/12/2018 0000   PROTEINUR NEGATIVE 04/12/2018 0000   UROBILINOGEN negative 06/10/2012 1631   NITRITE NEGATIVE 04/12/2018 0000   LEUKOCYTESUR NEGATIVE 04/12/2018 0000   Sepsis Labs: @LABRCNTIP (procalcitonin:4,lacticidven:4) )No results found for this or any previous visit (from the past 240 hour(s)).   Radiological Exams on Admission: Dg Chest 2 View  Result Date: 04/11/2018 CLINICAL DATA:  Chest pain and  shortness of breath. Severe anxiety. Loss of consciousness for about 10 minutes today, found on the kitchen floor supine and  snoring. EXAM: CHEST - 2 VIEW COMPARISON:  01/31/2018. FINDINGS: Enlarged cardiac silhouette with improvement. Decreased prominence of the pulmonary vasculature. Stable mildly prominent interstitial markings with diffuse peribronchial thickening. Mild thoracic spine degenerative changes. No fracture pneumothorax seen. IMPRESSION: 1. No acute abnormality. 2. Improved cardiomegaly and pulmonary vascular congestion. 3. Stable mild chronic bronchitic changes and mild chronic interstitial lung disease. Electronically Signed   By: Claudie Revering M.D.   On: 04/11/2018 18:16   Ct Head Wo Contrast  Result Date: 04/11/2018 CLINICAL DATA:  Loss of consciousness.  Fall. EXAM: CT HEAD WITHOUT CONTRAST CT CERVICAL SPINE WITHOUT CONTRAST TECHNIQUE: Multidetector CT imaging of the head and cervical spine was performed following the standard protocol without intravenous contrast. Multiplanar CT image reconstructions of the cervical spine were also generated. COMPARISON:  01/31/2018. FINDINGS: CT HEAD FINDINGS Brain: Multiple subtle foci of subarachnoid hemorrhage are identified. Tiny focus identified in the high right parietal lobe near the vertex (image 26/series 3 and sagittal image 29/series 6). There is also a trace amount of subarachnoid hemorrhage in the left frontal lobe (see image 18 of axial series 3 and sagittal image 40 of series 6. No abnormal extra-axial fluid collection. Patchy low attenuation in the deep hemispheric and periventricular white matter is nonspecific, but likely reflects chronic microvascular ischemic demyelination. Vascular: No hyperdense vessel or unexpected calcification. Skull: No evidence for fracture. No worrisome lytic or sclerotic lesion. Sinuses/Orbits: The visualized paranasal sinuses and mastoid air cells are clear. Visualized portions of the globes and intraorbital  fat are unremarkable. Other: None. CT CERVICAL SPINE FINDINGS Alignment: Straightening of normal cervical lordosis evident. Trace anterolisthesis of C3 on 4 is stable since prior study. Skull base and vertebrae: No evidence for an acute fracture. Soft tissues and spinal canal: No prevertebral fluid or swelling. No visible canal hematoma. Disc levels: Marked loss of disc height with endplate degeneration evident at C4-5, C5-6, C6-7, and C7-T1. The left C4-5 facets are fused in there is diffuse bilateral facet osteoarthritis. Upper chest: Unremarkable. Other: None. IMPRESSION: 1. Trace subarachnoid hemorrhage noted in the high right parietal lobe and anterior left frontal lobe. 2. Chronic small vessel white matter ischemic disease. 3. Advanced degenerative changes in the cervical spine without fracture. Critical Value/emergent results were called by telephone at the time of interpretation on 04/11/2018 at 6:13 pm to [facility] , who verbally acknowledged these results. Electronically Signed   By: Misty Stanley M.D.   On: 04/11/2018 18:13   Ct Cervical Spine Wo Contrast  Result Date: 04/11/2018 CLINICAL DATA:  Loss of consciousness.  Fall. EXAM: CT HEAD WITHOUT CONTRAST CT CERVICAL SPINE WITHOUT CONTRAST TECHNIQUE: Multidetector CT imaging of the head and cervical spine was performed following the standard protocol without intravenous contrast. Multiplanar CT image reconstructions of the cervical spine were also generated. COMPARISON:  01/31/2018. FINDINGS: CT HEAD FINDINGS Brain: Multiple subtle foci of subarachnoid hemorrhage are identified. Tiny focus identified in the high right parietal lobe near the vertex (image 26/series 3 and sagittal image 29/series 6). There is also a trace amount of subarachnoid hemorrhage in the left frontal lobe (see image 18 of axial series 3 and sagittal image 40 of series 6. No abnormal extra-axial fluid collection. Patchy low attenuation in the deep hemispheric and  periventricular white matter is nonspecific, but likely reflects chronic microvascular ischemic demyelination. Vascular: No hyperdense vessel or unexpected calcification. Skull: No evidence for fracture. No worrisome lytic or sclerotic lesion. Sinuses/Orbits: The visualized paranasal sinuses and mastoid air cells are  clear. Visualized portions of the globes and intraorbital fat are unremarkable. Other: None. CT CERVICAL SPINE FINDINGS Alignment: Straightening of normal cervical lordosis evident. Trace anterolisthesis of C3 on 4 is stable since prior study. Skull base and vertebrae: No evidence for an acute fracture. Soft tissues and spinal canal: No prevertebral fluid or swelling. No visible canal hematoma. Disc levels: Marked loss of disc height with endplate degeneration evident at C4-5, C5-6, C6-7, and C7-T1. The left C4-5 facets are fused in there is diffuse bilateral facet osteoarthritis. Upper chest: Unremarkable. Other: None. IMPRESSION: 1. Trace subarachnoid hemorrhage noted in the high right parietal lobe and anterior left frontal lobe. 2. Chronic small vessel white matter ischemic disease. 3. Advanced degenerative changes in the cervical spine without fracture. Critical Value/emergent results were called by telephone at the time of interpretation on 04/11/2018 at 6:13 pm to [facility] , who verbally acknowledged these results. Electronically Signed   By: Misty Stanley M.D.   On: 04/11/2018 18:13   Mr Brain Wo Contrast  Result Date: 04/12/2018 CLINICAL DATA:  Altered mental status and syncope EXAM: MRI HEAD WITHOUT CONTRAST TECHNIQUE: Multiplanar, multiecho pulse sequences of the brain and surrounding structures were obtained without intravenous contrast. COMPARISON:  Head CT 04/11/2018 FINDINGS: The examination had to be discontinued prior to completion due to patient altered mental status. Axial and coronal diffusion-weighted imaging, sagittal T1-weighted imaging, axial T2-weighted imaging  and axial T2 FLAIR sequences were acquired. BRAIN: No acute ischemia. There is redemonstration of small volume subarachnoid hemorrhage along the anterior left frontal lobe and superior right frontal lobe. The midline structures are normal. No midline shift or other mass effect. There are no old infarcts. The white matter signal is normal for the patient's age. The cerebral and cerebellar volume are age-appropriate. VASCULAR: Major intracranial arterial and venous sinus flow voids are normal. SKULL AND UPPER CERVICAL SPINE: Small right parietal scalp hematoma. SINUSES/ORBITS: No fluid levels or advanced mucosal thickening. No mastoid or middle ear effusion. The orbits are normal. IMPRESSION: Small right parietal scalp hematoma and small volume traumatic subarachnoid hemorrhage overlying the superior right frontal and anterior left frontal lobes. Electronically Signed   By: Ulyses Jarred M.D.   On: 04/12/2018 02:16     EKG: Independently reviewed.  Sinus rhythm, QTC 453, low voltage, poor R wave progression, LAD.  Assessment/Plan Principal Problem:   Syncope Active Problems:   Hypothyroid   Von Willebrand disease ([place])   GERD (gastroesophageal reflux disease)   Memory impairment   Depression   Subarachnoid hemorrhage ([facility])   Syncope and fall: Etiology is not clear. Patient had this symptoms in the past. The differential diagnosis is broad, including vasovagal syncope, seizure, TIA, ACS (less likely, given no chest pain), orthostatic status.  - Place on tele bed for obs - Orthostatic vital signs  - MRI-brain - EEG - 2d echo - Neuro checks  - IVF: NS 100 cc/h - PT/OT eval and treat  Subarachnoid hemorrhage: CT showed trace subarachnoid hemorrhage noted in the high right parietal lobe and anterior left frontal lobe.  Neurosurgeon, Dr. Venetia Constable was consulted. -Repeat CT head in morning - PRN Percocet and Tylenol for headache. -control BP  Essential hypertension: not taking meds at home.  Bp 110/93 -IV Hydralazine prn for SBP>160 -Continue home medications:   GERD: -Protonix  Hypothyroidism: Last TSH was 2.34 on 10/08/17 -Continue home Synthroid  Depression: Stable, no suicidal or homicidal ideations. -Continue home medications: zoloft  Von Willebrand disease: pt carries this problem on  her medical problem list, but she is not aware of this issue. -Avoid using heparin or Lovenox   DVT ppx: SCD Code Status: DNR (discussed with patient in the presence of her husband who is power of attorney, explained the meaning of CODE STATUS, both patient and power of attorney wants patient to be DNR. Family Communication:  Yes, patient's husband at bed side Disposition Plan:  Anticipate discharge back to previous home environment Consults called: Neurosurgeon was consulted Admission status: Obs / tele    Date of Service 04/12/2018    Ivor Costa [facility] Pager [phone]  If 7PM-7AM, please contact night-coverage [email or web address] Password [facility] 04/12/2018, 5:34 AM

## 2018-04-11 NOTE — ED Notes (Signed)
Patient transported to CT 

## 2018-04-11 NOTE — ED Notes (Signed)
Pt requesting to use bedside commode again before cath insertion; able to urinate 422ml, 142 ml remains in bladder per bladder scanner. Pt reports feeling better

## 2018-04-11 NOTE — ED Triage Notes (Signed)
Pt lives at home with husband. He heard loud noise and found pt in the floor of kitchen supine with snoring by husband and was unconscious and had vomited. For 10 min she was LOC. Pt states her head hurts and has been crying the whole ride. States something similar happened before per husband with no diagnoses. Pt has hx of dementia. 120/44, 78 HR, on room air.

## 2018-04-11 NOTE — ED Notes (Signed)
Pt c/o urinary retention. Bladder scan showing 647ml; Dr.Niu made aware. Foley cath to be ordered

## 2018-04-11 NOTE — ED Notes (Signed)
Attempted in and out cath for urine sample. Pt very tense and uncomfortable. Tearful during procedure. Unable to obtain urine at this time

## 2018-04-11 NOTE — ED Notes (Signed)
Pt trying to remove monitoring wires. Appears anxious and uncomfortable. Pt assisted to bedside commode to attempt to urinate. C/o headache. Dr.Niu text paged for medication

## 2018-04-11 NOTE — ED Notes (Signed)
Spouse states pt has no short term memory at baseline

## 2018-04-12 ENCOUNTER — Observation Stay (HOSPITAL_BASED_OUTPATIENT_CLINIC_OR_DEPARTMENT_OTHER): Payer: PPO

## 2018-04-12 ENCOUNTER — Other Ambulatory Visit: Payer: Self-pay

## 2018-04-12 ENCOUNTER — Observation Stay (HOSPITAL_COMMUNITY): Payer: PPO

## 2018-04-12 ENCOUNTER — Encounter (HOSPITAL_COMMUNITY): Payer: Self-pay | Admitting: General Practice

## 2018-04-12 DIAGNOSIS — I609 Nontraumatic subarachnoid hemorrhage, unspecified: Secondary | ICD-10-CM

## 2018-04-12 DIAGNOSIS — K219 Gastro-esophageal reflux disease without esophagitis: Secondary | ICD-10-CM

## 2018-04-12 DIAGNOSIS — E039 Hypothyroidism, unspecified: Secondary | ICD-10-CM

## 2018-04-12 DIAGNOSIS — R413 Other amnesia: Secondary | ICD-10-CM

## 2018-04-12 DIAGNOSIS — S066X0A Traumatic subarachnoid hemorrhage without loss of consciousness, initial encounter: Secondary | ICD-10-CM | POA: Diagnosis not present

## 2018-04-12 DIAGNOSIS — D680 Von Willebrand's disease: Secondary | ICD-10-CM | POA: Diagnosis not present

## 2018-04-12 DIAGNOSIS — R55 Syncope and collapse: Secondary | ICD-10-CM | POA: Diagnosis not present

## 2018-04-12 DIAGNOSIS — F320 Major depressive disorder, single episode, mild: Secondary | ICD-10-CM | POA: Diagnosis not present

## 2018-04-12 DIAGNOSIS — S066X1A Traumatic subarachnoid hemorrhage with loss of consciousness of 30 minutes or less, initial encounter: Secondary | ICD-10-CM | POA: Diagnosis not present

## 2018-04-12 DIAGNOSIS — S0003XA Contusion of scalp, initial encounter: Secondary | ICD-10-CM | POA: Diagnosis not present

## 2018-04-12 DIAGNOSIS — I361 Nonrheumatic tricuspid (valve) insufficiency: Secondary | ICD-10-CM

## 2018-04-12 LAB — URINALYSIS, ROUTINE W REFLEX MICROSCOPIC
Bilirubin Urine: NEGATIVE
Glucose, UA: NEGATIVE mg/dL
Hgb urine dipstick: NEGATIVE
Ketones, ur: NEGATIVE mg/dL
Leukocytes, UA: NEGATIVE
Nitrite: NEGATIVE
Protein, ur: NEGATIVE mg/dL
Specific Gravity, Urine: 1.012 (ref 1.005–1.030)
pH: 8 (ref 5.0–8.0)

## 2018-04-12 LAB — BASIC METABOLIC PANEL
Anion gap: 11 (ref 5–15)
BUN: 13 mg/dL (ref 8–23)
CO2: 24 mmol/L (ref 22–32)
Calcium: 9.2 mg/dL (ref 8.9–10.3)
Chloride: 106 mmol/L (ref 98–111)
Creatinine, Ser: 1.07 mg/dL — ABNORMAL HIGH (ref 0.44–1.00)
GFR calc Af Amer: 60 mL/min — ABNORMAL LOW (ref >60)
GFR calc non Af Amer: 51 mL/min — ABNORMAL LOW (ref >60)
Glucose, Bld: 147 mg/dL — ABNORMAL HIGH (ref 70–99)
Potassium: 3.9 mmol/L (ref 3.5–5.1)
Sodium: 141 mmol/L (ref 135–145)

## 2018-04-12 LAB — HIV ANTIBODY (ROUTINE TESTING W REFLEX): HIV Screen 4th Generation wRfx: NONREACTIVE

## 2018-04-12 LAB — ECHOCARDIOGRAM COMPLETE
Height: 63 in
Weight: 2504.43 oz

## 2018-04-12 LAB — CBG MONITORING, ED: Glucose-Capillary: 140 mg/dL — ABNORMAL HIGH (ref 70–99)

## 2018-04-12 MED ORDER — ONDANSETRON HCL 4 MG/2ML IJ SOLN
4.0000 mg | Freq: Four times a day (QID) | INTRAMUSCULAR | Status: DC | PRN
  Administered 2018-04-12: 4 mg via INTRAVENOUS
  Filled 2018-04-12: qty 2

## 2018-04-12 MED ORDER — ONDANSETRON HCL 4 MG PO TABS: 4.0000 mg | ORAL_TABLET | Freq: Four times a day (QID) | ORAL | Status: DC | PRN

## 2018-04-12 MED ORDER — ZOLPIDEM TARTRATE 5 MG PO TABS
5.0000 mg | ORAL_TABLET | Freq: Every evening | ORAL | Status: DC | PRN
  Administered 2018-04-12: 2.5 mg via ORAL
  Filled 2018-04-12: qty 1

## 2018-04-12 MED ORDER — HYDRALAZINE HCL 20 MG/ML IJ SOLN: 5.0000 mg | INTRAMUSCULAR | Status: DC | PRN

## 2018-04-12 MED ORDER — SENNOSIDES-DOCUSATE SODIUM 8.6-50 MG PO TABS: 1.0000 | ORAL_TABLET | Freq: Every evening | ORAL | Status: DC | PRN

## 2018-04-12 MED ORDER — SODIUM CHLORIDE 0.9% FLUSH
3.0000 mL | Freq: Two times a day (BID) | INTRAVENOUS | Status: DC
  Administered 2018-04-12: 3 mL via INTRAVENOUS

## 2018-04-12 NOTE — ED Notes (Signed)
Husband called out from room; pt noted to be pale, clammy; CBG 140. Pt appears dizzy when sitting on side of bed, assisted pt back to bed; cool cloth placed on head

## 2018-04-12 NOTE — Progress Notes (Signed)
  Echocardiogram 2D Echocardiogram has been performed.  Jennette Dubin 04/12/2018, 9:56 AM

## 2018-04-12 NOTE — ED Notes (Signed)
Pt was in MRI; able to get most of the imaging completed then panic and refused to go back in scanner. Pt enroute to room now

## 2018-04-12 NOTE — Consult Note (Signed)
Neurosurgery Consultation  Reason for Consult: Traumatic subarachnoid hemorrhage Referring Physician: Reesa Chew  CC: fall  HPI: This is a 72 y.o. woman that presents after an unwitnessed fall. Per her husband, he heard her call out from the other room then found her on the floor with 40min LOC. She does not recall the event, but he witnessed her passing out in front of him 2 weeks ago. At that time it was a sudden LOC without obvious sign of premonition and fell to the ground with what he thought was a complete loss of motor tone. No clonic movement at that time, pt denies aura-like sensations since.  ROS: A 14 point ROS was performed and is negative except as noted in the HPI.   PMHx:  Past Medical History:  Diagnosis Date  . Anxiety   . Arthritis    ?OA vs Rheum (established with Dr. Jefm Bryant pending blood work)  . Articular cartilage disorder of shoulder 04/2012   right  . Back pain    h/o DDD since 2010  . Dementia ([place])   . Dental crowns present    also dental caps  . Diabetes mellitus    NIDDM  . Fecal incontinence   . GERD (gastroesophageal reflux disease)   . Headache(784.0)    occasional  . History of skin cancer   . Hyperlipidemia    no current med.  . Hypertension   . Hyperthyroidism   . Hypothyroid   . Impingement syndrome of right shoulder 04/2012  . Memory impairment    dementia- worsenign 12/2016 with medication non compliance and delusions.    . Right rotator cuff tear 05/15/2012  . Rotator cuff rupture 04/2012   right  . Von Willebrand disease ([place])    FamHx:  Family History  Problem Relation Age of Onset  . Dementia Mother   . Arthritis Mother   . Cancer Father        unknown primary  . Heart disease Father   . Thyroid disease Sister   . Cancer Sister        brain tumor  . Dementia Sister   . COPD Brother   . Cancer Brother        bone cancer  . Colon cancer Paternal Grandfather   . Breast cancer Neg Hx    SocHx:  reports that she has never  smoked. She has never used smokeless tobacco. She reports that she does not drink alcohol or use drugs.  Exam: Vital signs in last 24 hours: Temp:  [97.5 F (36.4 C)-98.8 F (37.1 C)] 98.8 F (37.1 C) (11/17 0907) Pulse Rate:  [64-93] 80 (11/17 0907) Resp:  [8-24] 18 (11/17 0907) BP: (95-149)/(43-131) 101/80 (11/17 0907) SpO2:  [92 %-100 %] 100 % (11/17 0907) Weight:  [71 kg] 71 kg (11/16 1716) General: Awake, alert, cooperative, lying in bed in NAD Head: normocephalic, L scalp hematoma HEENT: neck supple Pulmonary: breathing room air comfortably, no evidence of increased work of breathing Cardiac: RRR Abdomen: S NT ND Extremities: warm and well perfused x4 Neuro: AOx2, PERRL, EOMI, FS Strength 5/5 x4, SILTx4, +b/l drift but pt to cooperate well with drift testing   Assessment and Plan: 72 y.o. woman s/p fall likely related to syncope. CTH/MRI personally reviewed, which shows left frontal scalp hematoma, left frontal tSAH and R parietal tSAH. Pattern is very consistent with a classic coup-contrecoup pattern that would be a result of the fall, not a cause of the fall. Denies severe headaches prior to this  event, most consistent pathology is that this is traumatic SAH. -no acute neurosurgical intervention indicated -no scheduled neurosurgical follow up needed -defer further workup of inciting event / syncopal workup to primary team -okay with DVT chemoPPx 48h after trauma, if needed  Judith Part, MD 04/12/18 10:49 AM [facility]

## 2018-04-12 NOTE — Progress Notes (Signed)
PROGRESS NOTE    Stacy Davis  KMM:[id number] DOB: [birth year] DOA: 04/11/2018 PCP: Susy Frizzle, MD   Brief Narrative:  72 year old with history of essential hypertension, hyperlipidemia, von Willebrand disease, diet-controlled diabetes mellitus type 2, GERD, hypothyroidism, anxiety, dementia came to the hospital for evaluation of fall at home with loss of consciousness for about 10 minutes.  In the ER her vital signs were stable.  Her labs are unremarkable but CT of the head showed subarachnoid hemorrhage.  CT of the C-spine was negative for any acute pathology.  Neurosurgery was consulted who suggested this was likely secondary to Coup-contrecoup pattern injury from fall.   Assessment & Plan:   Principal Problem:   Syncope Active Problems:   Hypothyroid   Von Willebrand disease ([place])   GERD (gastroesophageal reflux disease)   Memory impairment   Depression   Subarachnoid hemorrhage (HCC)   Syncope and altered mental status, improved Acute small subarachnoid hemorrhage - Unknown exact etiology at this time.  CT of the head and MRI of the brain is suggestive of small right parietal scalp hematoma with small volume traumatic subarachnoid hemorrhage overlying superior right frontal lobe and anterior left frontal lobes. -Echocardiogram shows ejection fraction 55 to 60% with normal wall motion -EEG-pending -PT/OT is pending  Mild to moderate dehydration -Patient has some clinical signs of dehydration therefore we will give her IV fluids.  Encourage oral diet as well.  Generalized weakness -PT/OT evaluation pending.  Essential hypertension -Not taking any home medications.  IV hydralazine as needed  GERD -PPI  Hypothyroidism -Continue Synthroid  Depression -Continue Zoloft  History of von Willebrand disease -Holding off on heparin or Lovenox due to subarachnoid bleed.   DVT prophylaxis: SCDs Code Status: DNR Family Communication: None at  bedside Disposition Plan: We will hydrate her overnight, await EEG to be done.  PT/OT pending.  Once above work-up has been completed, we will arrange for safe disposition planning.  Consultants:   Neurosurgery  Procedures:   None  Antimicrobials:   None   Subjective: Slightly confused about the events from yesterday.  No complaints at this time but feels very tired.  When nurse try to get her out she appeared somewhat weak and unsteady as well.  Review of Systems Otherwise negative except as per HPI, including: General: Denies fever, chills, night sweats or unintended weight loss. Resp: Denies cough, wheezing, shortness of breath. Cardiac: Denies chest pain, palpitations, orthopnea, paroxysmal nocturnal dyspnea. GI: Denies abdominal pain, nausea, vomiting, diarrhea or constipation GU: Denies dysuria, frequency, hesitancy or incontinence MS: Denies muscle aches, joint pain or swelling Neuro: Denies headache, neurologic deficits (focal weakness, numbness, tingling), abnormal gait Psych: Denies anxiety, depression, SI/HI/AVH Skin: Denies new rashes or lesions ID: Denies sick contacts, exotic exposures, travel  Objective: Vitals:   04/12/18 0245 04/12/18 0400 04/12/18 0553 04/12/18 0907  BP: (!) 95/59 102/60 (!) 104/43 101/80  Pulse:  64 64 80  Resp: 18 14 (!) 8 18  Temp:   98.3 F (36.8 C) 98.8 F (37.1 C)  TempSrc:   Oral Oral  SpO2:  96% 100% 100%  Weight:      Height:        Intake/Output Summary (Last 24 hours) at 04/12/2018 1421 Last data filed at 04/12/2018 1300 Gross per 24 hour  Intake 631.67 ml  Output 0 ml  Net 631.67 ml   Filed Weights   04/11/18 1716  Weight: 71 kg    Examination:  General exam: Appears calm and comfortable,  dry mouth Respiratory system: Clear to auscultation. Respiratory effort normal. Cardiovascular system: S1 & S2 heard, RRR. No JVD, murmurs, rubs, gallops or clicks. No pedal edema. Gastrointestinal system: Abdomen is  nondistended, soft and nontender. No organomegaly or masses felt. Normal bowel sounds heard. Central nervous system: Alert and oriented x2. No focal neurological deficits. Extremities: Symmetric 4 x 5 power.  Overall very unsteady Skin: No rashes, lesions or ulcers Psychiatry: Judgement and insight appear normal. Mood & affect appropriate.     Data Reviewed:   CBC: Recent Labs  Lab 04/11/18 1757 04/11/18 1930  WBC  --  9.8  NEUTROABS  --  7.7  HGB 12.9 12.8  HCT 38.0 43.2  MCV  --  86.2  PLT  --  272   Basic Metabolic Panel: Recent Labs  Lab 04/11/18 1757 04/11/18 1930 04/11/18 2225 04/12/18 0424  NA 138 139  --  141  K 5.1 5.6* 3.6 3.9  CL 111 108  --  106  CO2  --  13*  --  24  GLUCOSE 127* 112*  --  147*  BUN 19 15  --  13  CREATININE 0.80 0.82  --  1.07*  CALCIUM  --  9.4  --  9.2   GFR: Estimated Creatinine Clearance: 46.2 mL/min (A) (by C-G formula based on SCr of 1.07 mg/dL (H)). Liver Function Tests: Recent Labs  Lab 04/11/18 1930  AST 43*  ALT QUANTITY NOT SUFFICIENT, UNABLE TO PERFORM TEST  ALKPHOS 85  BILITOT QUANTITY NOT SUFFICIENT, UNABLE TO PERFORM TEST  PROT 7.4  ALBUMIN 4.3   No results for input(s): LIPASE, AMYLASE in the last 168 hours. No results for input(s): AMMONIA in the last 168 hours. Coagulation Profile: Recent Labs  Lab 04/11/18 1930  INR 1.02   Cardiac Enzymes: No results for input(s): CKTOTAL, CKMB, CKMBINDEX, TROPONINI in the last 168 hours. BNP (last 3 results) No results for input(s): PROBNP in the last 8760 hours. HbA1C: No results for input(s): HGBA1C in the last 72 hours. CBG: Recent Labs  Lab 04/12/18 0247  GLUCAP 140*   Lipid Profile: No results for input(s): CHOL, HDL, LDLCALC, TRIG, CHOLHDL, LDLDIRECT in the last 72 hours. Thyroid Function Tests: No results for input(s): TSH, T4TOTAL, FREET4, T3FREE, THYROIDAB in the last 72 hours. Anemia Panel: No results for input(s): VITAMINB12, FOLATE, FERRITIN,  TIBC, IRON, RETICCTPCT in the last 72 hours. Sepsis Labs: No results for input(s): PROCALCITON, LATICACIDVEN in the last 168 hours.  No results found for this or any previous visit (from the past 240 hour(s)).       Radiology Studies: Dg Chest 2 View  Result Date: 04/11/2018 CLINICAL DATA:  Chest pain and shortness of breath. Severe anxiety. Loss of consciousness for about 10 minutes today, found on the kitchen floor supine and snoring. EXAM: CHEST - 2 VIEW COMPARISON:  01/31/2018. FINDINGS: Enlarged cardiac silhouette with improvement. Decreased prominence of the pulmonary vasculature. Stable mildly prominent interstitial markings with diffuse peribronchial thickening. Mild thoracic spine degenerative changes. No fracture pneumothorax seen. IMPRESSION: 1. No acute abnormality. 2. Improved cardiomegaly and pulmonary vascular congestion. 3. Stable mild chronic bronchitic changes and mild chronic interstitial lung disease. Electronically Signed   By: Claudie Revering M.D.   On: 04/11/2018 18:16   Ct Head Wo Contrast  Result Date: 04/12/2018 CLINICAL DATA:  Follow-up subarachnoid hemorrhage. EXAM: CT HEAD WITHOUT CONTRAST TECHNIQUE: Contiguous axial images were obtained from the base of the skull through the vertex without intravenous contrast. COMPARISON:  Head CT 04/11/2018 and MRI 04/12/2018 FINDINGS: Brain: Trace high posterior right frontal and anterior left frontal subarachnoid hemorrhage is unchanged. No new intracranial hemorrhage, acute infarct, mass, midline shift, or extra-axial fluid collection is identified. There is mild cerebral atrophy and mild chronic small vessel ischemic disease in the cerebral white matter. Vascular: No hyperdense vessel. Skull: No fracture or focal osseous lesion. Sinuses/Orbits: Paranasal sinuses and mastoid air cells are clear. Unremarkable orbits. Other: Small right parietal scalp hematoma, unchanged. IMPRESSION: Unchanged trace subarachnoid hemorrhage and small  right parietal scalp hematoma. Electronically Signed   By: Logan Bores M.D.   On: 04/12/2018 10:36   Ct Head Wo Contrast  Result Date: 04/11/2018 CLINICAL DATA:  Loss of consciousness.  Fall. EXAM: CT HEAD WITHOUT CONTRAST CT CERVICAL SPINE WITHOUT CONTRAST TECHNIQUE: Multidetector CT imaging of the head and cervical spine was performed following the standard protocol without intravenous contrast. Multiplanar CT image reconstructions of the cervical spine were also generated. COMPARISON:  01/31/2018. FINDINGS: CT HEAD FINDINGS Brain: Multiple subtle foci of subarachnoid hemorrhage are identified. Tiny focus identified in the high right parietal lobe near the vertex (image 26/series 3 and sagittal image 29/series 6). There is also a trace amount of subarachnoid hemorrhage in the left frontal lobe (see image 18 of axial series 3 and sagittal image 40 of series 6. No abnormal extra-axial fluid collection. Patchy low attenuation in the deep hemispheric and periventricular white matter is nonspecific, but likely reflects chronic microvascular ischemic demyelination. Vascular: No hyperdense vessel or unexpected calcification. Skull: No evidence for fracture. No worrisome lytic or sclerotic lesion. Sinuses/Orbits: The visualized paranasal sinuses and mastoid air cells are clear. Visualized portions of the globes and intraorbital fat are unremarkable. Other: None. CT CERVICAL SPINE FINDINGS Alignment: Straightening of normal cervical lordosis evident. Trace anterolisthesis of C3 on 4 is stable since prior study. Skull base and vertebrae: No evidence for an acute fracture. Soft tissues and spinal canal: No prevertebral fluid or swelling. No visible canal hematoma. Disc levels: Marked loss of disc height with endplate degeneration evident at C4-5, C5-6, C6-7, and C7-T1. The left C4-5 facets are fused in there is diffuse bilateral facet osteoarthritis. Upper chest: Unremarkable. Other: None. IMPRESSION: 1. Trace  subarachnoid hemorrhage noted in the high right parietal lobe and anterior left frontal lobe. 2. Chronic small vessel white matter ischemic disease. 3. Advanced degenerative changes in the cervical spine without fracture. Critical Value/emergent results were called by telephone at the time of interpretation on 04/11/2018 at 6:13 pm to [facility] , who verbally acknowledged these results. Electronically Signed   By: Misty Stanley M.D.   On: 04/11/2018 18:13   Ct Cervical Spine Wo Contrast  Result Date: 04/11/2018 CLINICAL DATA:  Loss of consciousness.  Fall. EXAM: CT HEAD WITHOUT CONTRAST CT CERVICAL SPINE WITHOUT CONTRAST TECHNIQUE: Multidetector CT imaging of the head and cervical spine was performed following the standard protocol without intravenous contrast. Multiplanar CT image reconstructions of the cervical spine were also generated. COMPARISON:  01/31/2018. FINDINGS: CT HEAD FINDINGS Brain: Multiple subtle foci of subarachnoid hemorrhage are identified. Tiny focus identified in the high right parietal lobe near the vertex (image 26/series 3 and sagittal image 29/series 6). There is also a trace amount of subarachnoid hemorrhage in the left frontal lobe (see image 18 of axial series 3 and sagittal image 40 of series 6. No abnormal extra-axial fluid collection. Patchy low attenuation in the deep hemispheric and periventricular white matter is nonspecific, but likely reflects chronic microvascular  ischemic demyelination. Vascular: No hyperdense vessel or unexpected calcification. Skull: No evidence for fracture. No worrisome lytic or sclerotic lesion. Sinuses/Orbits: The visualized paranasal sinuses and mastoid air cells are clear. Visualized portions of the globes and intraorbital fat are unremarkable. Other: None. CT CERVICAL SPINE FINDINGS Alignment: Straightening of normal cervical lordosis evident. Trace anterolisthesis of C3 on 4 is stable since prior study. Skull base and vertebrae: No  evidence for an acute fracture. Soft tissues and spinal canal: No prevertebral fluid or swelling. No visible canal hematoma. Disc levels: Marked loss of disc height with endplate degeneration evident at C4-5, C5-6, C6-7, and C7-T1. The left C4-5 facets are fused in there is diffuse bilateral facet osteoarthritis. Upper chest: Unremarkable. Other: None. IMPRESSION: 1. Trace subarachnoid hemorrhage noted in the high right parietal lobe and anterior left frontal lobe. 2. Chronic small vessel white matter ischemic disease. 3. Advanced degenerative changes in the cervical spine without fracture. Critical Value/emergent results were called by telephone at the time of interpretation on 04/11/2018 at 6:13 pm to [facility] , who verbally acknowledged these results. Electronically Signed   By: Misty Stanley M.D.   On: 04/11/2018 18:13   Mr Brain Wo Contrast  Result Date: 04/12/2018 CLINICAL DATA:  Altered mental status and syncope EXAM: MRI HEAD WITHOUT CONTRAST TECHNIQUE: Multiplanar, multiecho pulse sequences of the brain and surrounding structures were obtained without intravenous contrast. COMPARISON:  Head CT 04/11/2018 FINDINGS: The examination had to be discontinued prior to completion due to patient altered mental status. Axial and coronal diffusion-weighted imaging, sagittal T1-weighted imaging, axial T2-weighted imaging and axial T2 FLAIR sequences were acquired. BRAIN: No acute ischemia. There is redemonstration of small volume subarachnoid hemorrhage along the anterior left frontal lobe and superior right frontal lobe. The midline structures are normal. No midline shift or other mass effect. There are no old infarcts. The white matter signal is normal for the patient's age. The cerebral and cerebellar volume are age-appropriate. VASCULAR: Major intracranial arterial and venous sinus flow voids are normal. SKULL AND UPPER CERVICAL SPINE: Small right parietal scalp hematoma. SINUSES/ORBITS: No fluid  levels or advanced mucosal thickening. No mastoid or middle ear effusion. The orbits are normal. IMPRESSION: Small right parietal scalp hematoma and small volume traumatic subarachnoid hemorrhage overlying the superior right frontal and anterior left frontal lobes. Electronically Signed   By: Ulyses Jarred M.D.   On: 04/12/2018 02:16        Scheduled Meds: . clotrimazole  1 application Topical BID  . levothyroxine  88 mcg Oral QPC supper  . pantoprazole  40 mg Oral Daily  . sertraline  50 mg Oral QPC supper  . sodium chloride flush  3 mL Intravenous Q12H   Continuous Infusions: . sodium chloride 100 mL/hr at 04/12/18 0305     LOS: 0 days   Time spent= 22 mins    Ankit Arsenio Loader, MD [facility] Pager [phone]   If 7PM-7AM, please contact night-coverage [email or web address] Password [email or web address] 04/12/2018, 2:21 PM

## 2018-04-12 NOTE — Evaluation (Signed)
Physical Therapy Evaluation Patient Details Name: Stacy Davis MRN: [id number] DOB: 1946-01-19 Today's Date: 04/12/2018   History of Present Illness  72 y.o. female admitted on 04/11/18 for fall with resultant SAHleft frontal scalp hematoma, left frontal SAH and R parietal SAH. Pattern is very consistent with a classic coup-contrecoup pattern that would be a result of the fall, not a cause of the fall.  Pt with significant PMH of Von Willebrand Disease, R RTC rupture s/p repair (2013), memory impairment/dementia, HTN, DM, R knee arthroscopy, and ORIF L wrist fx.    Clinical Impression  Pt was able to get up and move around the hallway with light min assist for balance and safety.  She did not do well with the RW (maybe this is a new device for her) as it acted more as an obstacle than an aid.  Pt would benefit from some follow up PT for balance and gait training.   PT to follow acutely for deficits listed below.      Follow Up Recommendations Home health PT;Supervision/Assistance - 24 hour    Equipment Recommendations  None recommended by PT    Recommendations for Other Services   NA    Precautions / Restrictions Precautions Precautions: Fall      Mobility  Bed Mobility Overal bed mobility: Needs Assistance Bed Mobility: Supine to Sit;Sit to Supine     Supine to sit: Min guard Sit to supine: Min guard   General bed mobility comments: Min guard assist for safety during transitions.   Transfers Overall transfer level: Needs assistance Equipment used: Rolling walker (2 wheeled) Transfers: Sit to/from Stand Sit to Stand: Min guard         General transfer comment: Min guard assist for balance when she initially stood.  Cues to hold to RW.   Ambulation/Gait Ambulation/Gait assistance: Min assist Gait Distance (Feet): 75 Feet Assistive device: Rolling walker (2 wheeled) Gait Pattern/deviations: Step-through pattern;Staggering right;Staggering left     General Gait  Details: Pt with staggering gait pattern.  RW served more as an obstacle than an aide (she likely does not use one at home), even without RW she requires min assist for balance.          Balance Overall balance assessment: Needs assistance Sitting-balance support: Feet supported;No upper extremity supported Sitting balance-Leahy Scale: Good Sitting balance - Comments: supervision EOB.    Standing balance support: Bilateral upper extremity supported;Single extremity supported Standing balance-Leahy Scale: Fair Standing balance comment: static standing with close supervision dynamic tasks require assist for safety.                              Pertinent Vitals/Pain Pain Assessment: No/denies pain    Home Living Family/patient expects to be discharged to:: Private residence Living Arrangements: Spouse/significant other Available Help at Discharge: Family             Additional Comments: Pt is an unreliable historian, no family present to confirm any home set up information    Prior Function                    Extremity/Trunk Assessment   Upper Extremity Assessment Upper Extremity Assessment: Overall WFL for tasks assessed    Lower Extremity Assessment Lower Extremity Assessment: Overall WFL for tasks assessed    Cervical / Trunk Assessment Cervical / Trunk Assessment: Kyphotic  Communication   Communication: No difficulties  Cognition Arousal/Alertness: Awake/alert Behavior During  Therapy: WFL for tasks assessed/performed Overall Cognitive Status: History of cognitive impairments - at baseline                                 General Comments: No family present to report baseline. Dementia and memory impairment listed in chart.  She is completely disoriented, but pleasant.      General Comments General comments (skin integrity, edema, etc.): No signs of lightheadedness, dizziness or pre-syncope.  Pt's orthostic BPs (taken earlier)  were negative.          Assessment/Plan    PT Assessment Patient needs continued PT services  PT Problem List Decreased balance;Decreased mobility;Decreased cognition;Decreased knowledge of use of DME;Decreased safety awareness;Decreased knowledge of precautions       PT Treatment Interventions DME instruction;Gait training;Stair training;Functional mobility training;Therapeutic activities;Therapeutic exercise;Balance training;Patient/family education;Cognitive remediation;Neuromuscular re-education    PT Goals (Current goals can be found in the Care Plan section)  Acute Rehab PT Goals Patient Stated Goal: none stated, wanted to know where her husband went PT Goal Formulation: Patient unable to participate in goal setting Time For Goal Achievement: 04/26/18 Potential to Achieve Goals: Good    Frequency Min 3X/week           AM-PAC PT "6 Clicks" Daily Activity  Outcome Measure Difficulty turning over in bed (including adjusting bedclothes, sheets and blankets)?: Unable Difficulty moving from lying on back to sitting on the side of the bed? : Unable Difficulty sitting down on and standing up from a chair with arms (e.g., wheelchair, bedside commode, etc,.)?: Unable Help needed moving to and from a bed to chair (including a wheelchair)?: A Little Help needed walking in hospital room?: A Little Help needed climbing 3-5 steps with a railing? : A Little 6 Click Score: 12    End of Session Equipment Utilized During Treatment: Gait belt Activity Tolerance: Patient tolerated treatment well Patient left: in bed;with call bell/phone within reach;with bed alarm set Nurse Communication: Mobility status PT Visit Diagnosis: History of falling (Z91.81);Difficulty in walking, not elsewhere classified (R26.2)    Time: 5809-9833 PT Time Calculation (min) (ACUTE ONLY): 22 min   Charges:       Lurena Joiner B. Rafeef Lau, PT, DPT  Acute Rehabilitation #[phone] pager #[phone]  [phone] office   PT Evaluation $PT Eval Moderate Complexity: 1 Mod          04/12/2018, 3:31 PM

## 2018-04-12 NOTE — ED Notes (Signed)
Pt taken to MRI  

## 2018-04-13 ENCOUNTER — Encounter: Payer: Self-pay | Admitting: Internal Medicine

## 2018-04-13 DIAGNOSIS — R413 Other amnesia: Secondary | ICD-10-CM | POA: Diagnosis not present

## 2018-04-13 DIAGNOSIS — R55 Syncope and collapse: Secondary | ICD-10-CM | POA: Diagnosis not present

## 2018-04-13 DIAGNOSIS — F320 Major depressive disorder, single episode, mild: Secondary | ICD-10-CM | POA: Diagnosis not present

## 2018-04-13 DIAGNOSIS — E039 Hypothyroidism, unspecified: Secondary | ICD-10-CM | POA: Diagnosis not present

## 2018-04-13 DIAGNOSIS — I609 Nontraumatic subarachnoid hemorrhage, unspecified: Secondary | ICD-10-CM | POA: Diagnosis not present

## 2018-04-13 DIAGNOSIS — D680 Von Willebrand's disease: Secondary | ICD-10-CM | POA: Diagnosis not present

## 2018-04-13 DIAGNOSIS — K219 Gastro-esophageal reflux disease without esophagitis: Secondary | ICD-10-CM | POA: Diagnosis not present

## 2018-04-13 NOTE — Progress Notes (Signed)
Patient discharged to home. After visit summary reviewed with patient spouse. He capable of re verbalizing medications and follow up appointments. Pt remains stable. No signs and symptoms of distress. Educated to return to ER in the event of SOB, dizziness, chest pain, or fainting. Mady Gemma, RN

## 2018-04-13 NOTE — Care Management Note (Addendum)
Case Management Note  Patient Details  Name: Stacy Davis MRN: [id number] Date of Birth: July 22, 1945  Subjective/Objective:    Pt admitted with LOC                Action/Plan:   PTA independent from home with husband.  Pt has PCP and denied barriers with paying for medications.  Pts husband will provide 24 hour supervision - husband retired.  Pt in agreement with HHPT - husband chose [facility] -agency contacted and referral accepted.     Expected Discharge Date:  04/13/18               Expected Discharge Plan:  Home/Self Care  In-House Referral:     Discharge planning Services  CM Consult  Post Acute Care Choice:    Choice offered to:  Patient  DME Arranged:    DME Agency:     HH Arranged:  PT, OT HH Agency:  [place]  Status of Service:  Completed, signed off  If discussed at [place] of Stay Meetings, dates discussed:    Additional Comments: 04/13/2018 CM informed by [facility] that agency can not provide both OT and PT.  Pt/husband are in alignment with [facility] - agency contacted and referral accepted Stacy Labrador, RN 04/13/2018, 12:15 PM

## 2018-04-13 NOTE — Discharge Summary (Signed)
Physician Discharge Summary  Stacy Davis MOQ:[id number] DOB: 07-30-1945 DOA: 04/11/2018  PCP: Susy Frizzle, MD  Admit date: 04/11/2018 Discharge date: 04/13/2018  Admitted From: Home Disposition: Home  Recommendations for Outpatient Follow-up:  1. Follow up with PCP in 1-2 weeks 2. Please obtain BMP/CBC in one week your next doctors visit.    Home Health: PT/OT Equipment/Devices: Per home health Discharge Condition: Stable CODE STATUS: DNR Diet recommendation: Regular  Brief/Interim Summary: 72 year old with history of essential hypertension, hyperlipidemia, von Willebrand disease, diet-controlled diabetes mellitus type 2, GERD, hypothyroidism, anxiety, dementia came to the hospital for evaluation of fall at home with loss of consciousness for about 10 minutes.  In the ER her vital signs were stable.  Her labs are unremarkable but CT of the head showed subarachnoid hemorrhage.  CT of the C-spine was negative for any acute pathology.  Neurosurgery was consulted who suggested this was likely secondary to Coup-contrecoup pattern injury from fall.  No further neurosurgery follow-up was necessary. Patient was seen by PT/OT who recommended home health therefore arrangements were made.  Patient has reached maximum benefit from hospital stay and stable for discharge.  Husband is at the bedside as well during my evaluation on the discharge day and he has been updated regarding her condition as well.   Discharge Diagnoses:  Principal Problem:   Syncope Active Problems:   Hypothyroid   Von Willebrand disease ([place])   GERD (gastroesophageal reflux disease)   Memory impairment   Depression   Subarachnoid hemorrhage (HCC)  Syncope and altered mental status, resolved Acute small subarachnoid hemorrhage - Unknown exact etiology at this time.  CT of the head and MRI of the brain is suggestive of small right parietal scalp hematoma with small volume traumatic subarachnoid hemorrhage  overlying superior right frontal lobe and anterior left frontal lobes. -Echocardiogram shows ejection fraction 55 to 60% with normal wall motion -PT/OT - home PT/OT -Seen by neurosurgery, cleared to be discharged.  No follow-up necessary  Mild to moderate dehydration -Resolved  Generalized weakness -PT/OT - home PT/OT  Essential hypertension -Not taking any home medications.  IV hydralazine as needed  GERD -PPI  Hypothyroidism -Continue Synthroid  Depression -Continue Zoloft  History of von Willebrand disease -Holding off on heparin or Lovenox due to subarachnoid bleed.   On SCDs while patient was here She is DNR Husband at bedside Discharge today  Discharge Instructions   Allergies as of 04/13/2018      Reactions   Tramadol Other (See Comments)   CHANGES IN MEMORY      Medication List    TAKE these medications   CHOLINE CITRATE PO Take 1 tablet by mouth daily after supper.   clotrimazole 1 % cream Commonly known as:  LOTRIMIN Apply 1 application topically 2 (two) times daily.   levothyroxine 88 MCG tablet Commonly known as:  SYNTHROID, LEVOTHROID TAKE 1 TABLET BY MOUTH ONCE A DAY What changed:  when to take this   nystatin powder Generic drug:  nystatin Apply topically daily as needed (under belly rash).   nystatin cream Commonly known as:  MYCOSTATIN Apply 1 application topically daily as needed (under belly rash).   omeprazole 40 MG capsule Commonly known as:  PRILOSEC TAKE 1 CAPSULE BY MOUTH ONCE DAILY What changed:  when to take this   OVER THE COUNTER MEDICATION Take 2 tablets by mouth 2 (two) times daily as needed (pain). "Back and Body" over the counter   sertraline 50 MG tablet Commonly known as:  ZOLOFT TAKE 1 TABLET BY MOUTH ONCE A DAY What changed:  when to take this   TURMERIC PO Take 1 capsule by mouth daily.      Follow-up Information    Susy Frizzle, MD. Schedule an appointment as soon as possible for a  visit in 1 week(s).   Specialty:  Family Medicine Contact information: [address] [place] [address] [phone]          Allergies  Allergen Reactions  . Tramadol Other (See Comments)    CHANGES IN MEMORY    You were cared for by a hospitalist during your hospital stay. If you have any questions about your discharge medications or the care you received while you were in the hospital after you are discharged, you can call the unit and asked to speak with the hospitalist on call if the hospitalist that took care of you is not available. Once you are discharged, your primary care physician will handle any further medical issues. Please note that no refills for any discharge medications will be authorized once you are discharged, as it is imperative that you return to your primary care physician (or establish a relationship with a primary care physician if you do not have one) for your aftercare needs so that they can reassess your need for medications and monitor your lab values.  Consultations:  Neurosurgery   Procedures/Studies: Dg Chest 2 View  Result Date: 04/11/2018 CLINICAL DATA:  Chest pain and shortness of breath. Severe anxiety. Loss of consciousness for about 10 minutes today, found on the kitchen floor supine and snoring. EXAM: CHEST - 2 VIEW COMPARISON:  01/31/2018. FINDINGS: Enlarged cardiac silhouette with improvement. Decreased prominence of the pulmonary vasculature. Stable mildly prominent interstitial markings with diffuse peribronchial thickening. Mild thoracic spine degenerative changes. No fracture pneumothorax seen. IMPRESSION: 1. No acute abnormality. 2. Improved cardiomegaly and pulmonary vascular congestion. 3. Stable mild chronic bronchitic changes and mild chronic interstitial lung disease. Electronically Signed   By: Claudie Revering M.D.   On: 04/11/2018 18:16   Ct Head Wo Contrast  Result Date: 04/12/2018 CLINICAL DATA:  Follow-up subarachnoid  hemorrhage. EXAM: CT HEAD WITHOUT CONTRAST TECHNIQUE: Contiguous axial images were obtained from the base of the skull through the vertex without intravenous contrast. COMPARISON:  Head CT 04/11/2018 and MRI 04/12/2018 FINDINGS: Brain: Trace high posterior right frontal and anterior left frontal subarachnoid hemorrhage is unchanged. No new intracranial hemorrhage, acute infarct, mass, midline shift, or extra-axial fluid collection is identified. There is mild cerebral atrophy and mild chronic small vessel ischemic disease in the cerebral white matter. Vascular: No hyperdense vessel. Skull: No fracture or focal osseous lesion. Sinuses/Orbits: Paranasal sinuses and mastoid air cells are clear. Unremarkable orbits. Other: Small right parietal scalp hematoma, unchanged. IMPRESSION: Unchanged trace subarachnoid hemorrhage and small right parietal scalp hematoma. Electronically Signed   By: Logan Bores M.D.   On: 04/12/2018 10:36   Ct Head Wo Contrast  Result Date: 04/11/2018 CLINICAL DATA:  Loss of consciousness.  Fall. EXAM: CT HEAD WITHOUT CONTRAST CT CERVICAL SPINE WITHOUT CONTRAST TECHNIQUE: Multidetector CT imaging of the head and cervical spine was performed following the standard protocol without intravenous contrast. Multiplanar CT image reconstructions of the cervical spine were also generated. COMPARISON:  01/31/2018. FINDINGS: CT HEAD FINDINGS Brain: Multiple subtle foci of subarachnoid hemorrhage are identified. Tiny focus identified in the high right parietal lobe near the vertex (image 26/series 3 and sagittal image 29/series 6). There is also a  trace amount of subarachnoid hemorrhage in the left frontal lobe (see image 18 of axial series 3 and sagittal image 40 of series 6. No abnormal extra-axial fluid collection. Patchy low attenuation in the deep hemispheric and periventricular white matter is nonspecific, but likely reflects chronic microvascular ischemic demyelination. Vascular: No hyperdense  vessel or unexpected calcification. Skull: No evidence for fracture. No worrisome lytic or sclerotic lesion. Sinuses/Orbits: The visualized paranasal sinuses and mastoid air cells are clear. Visualized portions of the globes and intraorbital fat are unremarkable. Other: None. CT CERVICAL SPINE FINDINGS Alignment: Straightening of normal cervical lordosis evident. Trace anterolisthesis of C3 on 4 is stable since prior study. Skull base and vertebrae: No evidence for an acute fracture. Soft tissues and spinal canal: No prevertebral fluid or swelling. No visible canal hematoma. Disc levels: Marked loss of disc height with endplate degeneration evident at C4-5, C5-6, C6-7, and C7-T1. The left C4-5 facets are fused in there is diffuse bilateral facet osteoarthritis. Upper chest: Unremarkable. Other: None. IMPRESSION: 1. Trace subarachnoid hemorrhage noted in the high right parietal lobe and anterior left frontal lobe. 2. Chronic small vessel white matter ischemic disease. 3. Advanced degenerative changes in the cervical spine without fracture. Critical Value/emergent results were called by telephone at the time of interpretation on 04/11/2018 at 6:13 pm to [facility] , who verbally acknowledged these results. Electronically Signed   By: Misty Stanley M.D.   On: 04/11/2018 18:13   Ct Cervical Spine Wo Contrast  Result Date: 04/11/2018 CLINICAL DATA:  Loss of consciousness.  Fall. EXAM: CT HEAD WITHOUT CONTRAST CT CERVICAL SPINE WITHOUT CONTRAST TECHNIQUE: Multidetector CT imaging of the head and cervical spine was performed following the standard protocol without intravenous contrast. Multiplanar CT image reconstructions of the cervical spine were also generated. COMPARISON:  01/31/2018. FINDINGS: CT HEAD FINDINGS Brain: Multiple subtle foci of subarachnoid hemorrhage are identified. Tiny focus identified in the high right parietal lobe near the vertex (image 26/series 3 and sagittal image 29/series 6).  There is also a trace amount of subarachnoid hemorrhage in the left frontal lobe (see image 18 of axial series 3 and sagittal image 40 of series 6. No abnormal extra-axial fluid collection. Patchy low attenuation in the deep hemispheric and periventricular white matter is nonspecific, but likely reflects chronic microvascular ischemic demyelination. Vascular: No hyperdense vessel or unexpected calcification. Skull: No evidence for fracture. No worrisome lytic or sclerotic lesion. Sinuses/Orbits: The visualized paranasal sinuses and mastoid air cells are clear. Visualized portions of the globes and intraorbital fat are unremarkable. Other: None. CT CERVICAL SPINE FINDINGS Alignment: Straightening of normal cervical lordosis evident. Trace anterolisthesis of C3 on 4 is stable since prior study. Skull base and vertebrae: No evidence for an acute fracture. Soft tissues and spinal canal: No prevertebral fluid or swelling. No visible canal hematoma. Disc levels: Marked loss of disc height with endplate degeneration evident at C4-5, C5-6, C6-7, and C7-T1. The left C4-5 facets are fused in there is diffuse bilateral facet osteoarthritis. Upper chest: Unremarkable. Other: None. IMPRESSION: 1. Trace subarachnoid hemorrhage noted in the high right parietal lobe and anterior left frontal lobe. 2. Chronic small vessel white matter ischemic disease. 3. Advanced degenerative changes in the cervical spine without fracture. Critical Value/emergent results were called by telephone at the time of interpretation on 04/11/2018 at 6:13 pm to [facility] , who verbally acknowledged these results. Electronically Signed   By: Misty Stanley M.D.   On: 04/11/2018 18:13   Mr Brain Lottie Dawson  Contrast  Result Date: 04/12/2018 CLINICAL DATA:  Altered mental status and syncope EXAM: MRI HEAD WITHOUT CONTRAST TECHNIQUE: Multiplanar, multiecho pulse sequences of the brain and surrounding structures were obtained without intravenous contrast.  COMPARISON:  Head CT 04/11/2018 FINDINGS: The examination had to be discontinued prior to completion due to patient altered mental status. Axial and coronal diffusion-weighted imaging, sagittal T1-weighted imaging, axial T2-weighted imaging and axial T2 FLAIR sequences were acquired. BRAIN: No acute ischemia. There is redemonstration of small volume subarachnoid hemorrhage along the anterior left frontal lobe and superior right frontal lobe. The midline structures are normal. No midline shift or other mass effect. There are no old infarcts. The white matter signal is normal for the patient's age. The cerebral and cerebellar volume are age-appropriate. VASCULAR: Major intracranial arterial and venous sinus flow voids are normal. SKULL AND UPPER CERVICAL SPINE: Small right parietal scalp hematoma. SINUSES/ORBITS: No fluid levels or advanced mucosal thickening. No mastoid or middle ear effusion. The orbits are normal. IMPRESSION: Small right parietal scalp hematoma and small volume traumatic subarachnoid hemorrhage overlying the superior right frontal and anterior left frontal lobes. Electronically Signed   By: Ulyses Jarred M.D.   On: 04/12/2018 02:16      Subjective: No complaints, feeling better.  Husband at bedside.  General = no fevers, chills, dizziness, malaise, fatigue HEENT/EYES = negative for pain, redness, loss of vision, double vision, blurred vision, loss of hearing, sore throat, hoarseness, dysphagia Cardiovascular= negative for chest pain, palpitation, murmurs, lower extremity swelling Respiratory/lungs= negative for shortness of breath, cough, hemoptysis, wheezing, mucus production Gastrointestinal= negative for nausea, vomiting,, abdominal pain, melena, hematemesis Genitourinary= negative for Dysuria, Hematuria, Change in Urinary Frequency MSK = Negative for arthralgia, myalgias, Back Pain, Joint swelling  Neurology= Negative for headache, seizures, numbness, tingling  Psychiatry=  Negative for anxiety, depression, suicidal and homocidal ideation Allergy/Immunology= Medication/Food allergy as listed  Skin= Negative for Rash, lesions, ulcers, itching    Discharge Exam: Vitals:   04/13/18 0424 04/13/18 0847  BP: (!) 141/72 (!) 142/116  Pulse: 61 (!) 57  Resp: 18 18  Temp: 98 F (36.7 C) 97.7 F (36.5 C)  SpO2: 96% 99%   Vitals:   04/12/18 1700 04/12/18 2040 04/13/18 0424 04/13/18 0847  BP: 102/74 122/75 (!) 141/72 (!) 142/116  Pulse: 80 77 61 (!) 57  Resp: 18 18 18 18   Temp: 98.6 F (37 C) 97.6 F (36.4 C) 98 F (36.7 C) 97.7 F (36.5 C)  TempSrc:    Oral  SpO2: 100% 99% 96% 99%  Weight:      Height:        General: Pt is alert, awake, not in acute distress Cardiovascular: RRR, S1/S2 +, no rubs, no gallops Respiratory: CTA bilaterally, no wheezing, no rhonchi Abdominal: Soft, NT, ND, bowel sounds + Extremities: no edema, no cyanosis    The results of significant diagnostics from this hospitalization (including imaging, microbiology, ancillary and laboratory) are listed below for reference.     Microbiology: No results found for this or any previous visit (from the past 240 hour(s)).   Labs: BNP (last 3 results) No results for input(s): BNP in the last 8760 hours. Basic Metabolic Panel: Recent Labs  Lab 04/11/18 1757 04/11/18 1930 04/11/18 2225 04/12/18 0424  NA 138 139  --  141  K 5.1 5.6* 3.6 3.9  CL 111 108  --  106  CO2  --  13*  --  24  GLUCOSE 127* 112*  --  147*  BUN 19 15  --  13  CREATININE 0.80 0.82  --  1.07*  CALCIUM  --  9.4  --  9.2   Liver Function Tests: Recent Labs  Lab 04/11/18 1930  AST 43*  ALT QUANTITY NOT SUFFICIENT, UNABLE TO PERFORM TEST  ALKPHOS 85  BILITOT QUANTITY NOT SUFFICIENT, UNABLE TO PERFORM TEST  PROT 7.4  ALBUMIN 4.3   No results for input(s): LIPASE, AMYLASE in the last 168 hours. No results for input(s): AMMONIA in the last 168 hours. CBC: Recent Labs  Lab 04/11/18 1757  04/11/18 1930  WBC  --  9.8  NEUTROABS  --  7.7  HGB 12.9 12.8  HCT 38.0 43.2  MCV  --  86.2  PLT  --  270   Cardiac Enzymes: No results for input(s): CKTOTAL, CKMB, CKMBINDEX, TROPONINI in the last 168 hours. BNP: Invalid input(s): POCBNP CBG: Recent Labs  Lab 04/12/18 0247  GLUCAP 140*   D-Dimer No results for input(s): DDIMER in the last 72 hours. Hgb A1c No results for input(s): HGBA1C in the last 72 hours. Lipid Profile No results for input(s): CHOL, HDL, LDLCALC, TRIG, CHOLHDL, LDLDIRECT in the last 72 hours. Thyroid function studies No results for input(s): TSH, T4TOTAL, T3FREE, THYROIDAB in the last 72 hours.  Invalid input(s): FREET3 Anemia work up No results for input(s): VITAMINB12, FOLATE, FERRITIN, TIBC, IRON, RETICCTPCT in the last 72 hours. Urinalysis    Component Value Date/Time   COLORURINE STRAW (A) 04/12/2018 0000   APPEARANCEUR CLEAR 04/12/2018 0000   LABSPEC 1.012 04/12/2018 0000   PHURINE 8.0 04/12/2018 0000   GLUCOSEU NEGATIVE 04/12/2018 0000   HGBUR NEGATIVE 04/12/2018 0000   BILIRUBINUR NEGATIVE 04/12/2018 0000   BILIRUBINUR negative 06/10/2012 1631   KETONESUR NEGATIVE 04/12/2018 0000   PROTEINUR NEGATIVE 04/12/2018 0000   UROBILINOGEN negative 06/10/2012 1631   NITRITE NEGATIVE 04/12/2018 0000   LEUKOCYTESUR NEGATIVE 04/12/2018 0000   Sepsis Labs Invalid input(s): PROCALCITONIN,  WBC,  LACTICIDVEN Microbiology No results found for this or any previous visit (from the past 240 hour(s)).   Time coordinating discharge:  I have spent 35 minutes face to face with the patient and on the ward discussing the patients care, assessment, plan and disposition with other care givers. >50% of the time was devoted counseling the patient about the risks and benefits of treatment/Discharge disposition and coordinating care.   SIGNED:   Damita Lack, MD  [facility] 04/13/2018, 11:50 AM Pager   If 7PM-7AM, please contact  night-coverage [email or web address] Password [email or web address]

## 2018-04-13 NOTE — Evaluation (Signed)
Occupational Therapy Evaluation Patient Details Name: Stacy Davis MRN: [id number] DOB: 1945-10-14 Today's Date: 04/13/2018    History of Present Illness 72 y.o. female admitted on 04/11/18 for fall with resultant SAHleft frontal scalp hematoma, left frontal SAH and R parietal SAH. Pattern is very consistent with a classic coup-contrecoup pattern that would be a result of the fall, not a cause of the fall.  Pt with significant PMH of Von Willebrand Disease, R RTC rupture s/p repair (2013), memory impairment/dementia, HTN, DM, R knee arthroscopy, and ORIF L wrist fx.     Clinical Impression   This 72 yo female admitted with above presents to acute OT with PLOF of being able to do most of her basic ADLs with occasional A and h/o falls. Now pt is min A when up on her feet for any basic ADL and needs cues for tasks as well. She will benefit from acute OT with follow up HHOT and 24 hour S/prn A.    Follow Up Recommendations  Home health OT;Supervision/Assistance - 24 hour    Equipment Recommendations  None recommended by OT       Precautions / Restrictions Precautions Precautions: Fall Restrictions Weight Bearing Restrictions: No      Mobility Bed Mobility Overal bed mobility: Needs Assistance Bed Mobility: Supine to Sit     Supine to sit: Supervision     General bed mobility comments: VCs for technique  Transfers Overall transfer level: Needs assistance Equipment used: None Transfers: Sit to/from Stand Sit to Stand: Min assist         General transfer comment: initally posterior lean    Balance Overall balance assessment: Needs assistance Sitting-balance support: No upper extremity supported;Feet supported Sitting balance-Leahy Scale: Good     Standing balance support: No upper extremity supported Standing balance-Leahy Scale: Poor Standing balance comment: gait belt to A with balance intermittently                           ADL either performed  or assessed with clinical judgement   ADL Overall ADL's : Needs assistance/impaired Eating/Feeding: Set up;Supervision/ safety;Sitting   Grooming: Min guard;Standing;Wash/dry face Grooming Details (indicate cue type and reason): Needed multiple VCs and gestural cues to go about washing her face  Upper Body Bathing: Set up;Supervision/ safety;Sitting   Lower Body Bathing: Minimal assistance;Sit to/from stand   Upper Body Dressing : Set up;Supervision/safety;Sitting   Lower Body Dressing: Minimal assistance;Sit to/from stand   Toilet Transfer: Minimal assistance;Ambulation   Toileting- Clothing Manipulation and Hygiene: Minimal assistance;Sit to/from Nurse, children's Details (indicate cue type and reason): Recommend to pt and husband that pt sit to shower for safety   General ADL Comments: Recommended to husband that he be with pt anytime she is up on her feet. I also gave him a gait belt     Vision Patient Visual Report: No change from baseline              Pertinent Vitals/Pain Pain Assessment: No/denies pain     Hand Dominance Right   Extremity/Trunk Assessment Upper Extremity Assessment Upper Extremity Assessment: Overall WFL for tasks assessed           Communication Communication Communication: No difficulties   Cognition Arousal/Alertness: Awake/alert Behavior During Therapy: WFL for tasks assessed/performed Overall Cognitive Status: History of cognitive impairments - at baseline  General Comments: Pt disoriented to place and time              Home Living Family/patient expects to be discharged to:: Private residence Living Arrangements: Spouse/significant other Available Help at Discharge: Family;Available 24 hours/day Type of Home: House       Home Layout: One level     Bathroom Shower/Tub: Walk-in shower;Door   [employer] Toilet: Standard     Home Equipment: Bedside  commode;Shower seat - built in          Prior Functioning/Environment Level of Independence: Needs assistance  Gait / Transfers Assistance Needed: Independent without AD pta ADL's / Homemaking Assistance Needed: needed S for basic ADLs at times            OT Problem List: Impaired balance (sitting and/or standing);Decreased cognition;Decreased safety awareness      OT Treatment/Interventions: Self-care/ADL training;Balance training;DME and/or AE instruction;Patient/family education    OT Goals(Current goals can be found in the care plan section) Acute Rehab OT Goals Patient Stated Goal: none stated OT Goal Formulation: With patient/family Time For Goal Achievement: 04/27/18 Potential to Achieve Goals: Good  OT Frequency: Min 2X/week              AM-PAC PT "6 Clicks" Daily Activity     Outcome Measure Help from another person eating meals?: A Little Help from another person taking care of personal grooming?: A Little Help from another person toileting, which includes using toliet, bedpan, or urinal?: A Little Help from another person bathing (including washing, rinsing, drying)?: A Little Help from another person to put on and taking off regular upper body clothing?: A Little Help from another person to put on and taking off regular lower body clothing?: A Little 6 Click Score: 18   End of Session Equipment Utilized During Treatment: Gait belt  Activity Tolerance: Patient tolerated treatment well Patient left: in chair;with call bell/phone within reach;with chair alarm set  OT Visit Diagnosis: Unsteadiness on feet (R26.81);Other abnormalities of gait and mobility (R26.89);Repeated falls (R29.6);History of falling (Z91.81);Other symptoms and signs involving cognitive function                Time: 2080-2233 OT Time Calculation (min): 32 min Charges:  OT General Charges $OT Visit: 1 Visit OT Evaluation $OT Eval Moderate Complexity: 1 Mod OT Treatments $Self  Care/Home Management : 8-22 mins  Golden Circle, OTR/L Acute [employer] Pager [phone] Office [phone]     Almon Register 04/13/2018, 9:48 AM

## 2018-04-13 NOTE — Progress Notes (Signed)
OT Cancellation Note  Patient Details Name: Stacy Davis MRN: [id number] DOB: Nov 02, 1945   Cancelled Treatment:    Reason Eval/Treat Not Completed: Other (comment). Pt sound asleep and husband in room states pt did not go to sleep until around 4:00AM--will re-attempt at later time.  Golden Circle, OTR/L Acute Rehab Services Pager [phone] Office [phone]     Almon Register 04/13/2018, 7:34 AM

## 2018-04-13 NOTE — Procedures (Signed)
ELECTROENCEPHALOGRAM REPORT   Patient: Stacy Davis       Room #: 5M20C EEG No. ID: [id number] Age: 72 y.o.        Sex: female Referring Physician: Reesa Chew Report Date:  04/13/2018        Interpreting Physician: Alexis Goodell  History: [name] is an 72 y.o. female with episode of loss of consciousness  Medications:  Synthroid, Protonix, Zoloft  Conditions of Recording:  This is a 21 channel routine scalp EEG performed with bipolar and monopolar montages arranged in accordance to the international 10/20 system of electrode placement. One channel was dedicated to EKG recording.  The patient is in the awake and drowsy states.  Description:  The waking background activity consists of a low voltage, symmetrical, fairly well organized, 8 Hz alpha activity, seen from the parieto-occipital and posterior temporal regions.  Low voltage fast activity, poorly organized, is seen anteriorly and is at times superimposed on more posterior regions.  A mixture of theta and alpha rhythms are seen from the central and temporal regions. The patient drowses with slowing to irregular, low voltage theta and beta activity.   Stage II sleep is not obtained. No epileptiform activity is noted.   Hyperventilation and intermittent photic stimulation were not performed.   IMPRESSION: Normal electroencephalogram, awake, drowsy and with activation procedures. There are no focal lateralizing or epileptiform features.   Alexis Goodell, MD Neurology [phone] 04/13/2018, 1:15 PM

## 2018-04-13 NOTE — Progress Notes (Signed)
EEG completed, results pending. 

## 2018-04-13 NOTE — Care Management Obs Status (Addendum)
[facility] NOTIFICATION   Patient Details  Name: Stacy Davis MRN: [id number] Date of Birth: [birth year]   Medicare Observation Status Notification Given:  Yes, husbands intitals   Maryclare Labrador, RN 04/13/2018, 10:07 AM

## 2018-04-14 ENCOUNTER — Other Ambulatory Visit: Payer: Self-pay | Admitting: Family Medicine

## 2018-04-15 DIAGNOSIS — I10 Essential (primary) hypertension: Secondary | ICD-10-CM | POA: Diagnosis not present

## 2018-04-15 DIAGNOSIS — E119 Type 2 diabetes mellitus without complications: Secondary | ICD-10-CM | POA: Diagnosis not present

## 2018-04-15 DIAGNOSIS — E039 Hypothyroidism, unspecified: Secondary | ICD-10-CM | POA: Diagnosis not present

## 2018-04-15 DIAGNOSIS — S066X1D Traumatic subarachnoid hemorrhage with loss of consciousness of 30 minutes or less, subsequent encounter: Secondary | ICD-10-CM | POA: Diagnosis not present

## 2018-04-15 DIAGNOSIS — F0280 Dementia in other diseases classified elsewhere without behavioral disturbance: Secondary | ICD-10-CM | POA: Diagnosis not present

## 2018-04-22 DIAGNOSIS — F0280 Dementia in other diseases classified elsewhere without behavioral disturbance: Secondary | ICD-10-CM | POA: Diagnosis not present

## 2018-04-22 DIAGNOSIS — E039 Hypothyroidism, unspecified: Secondary | ICD-10-CM | POA: Diagnosis not present

## 2018-04-22 DIAGNOSIS — S066X1D Traumatic subarachnoid hemorrhage with loss of consciousness of 30 minutes or less, subsequent encounter: Secondary | ICD-10-CM | POA: Diagnosis not present

## 2018-04-22 DIAGNOSIS — E119 Type 2 diabetes mellitus without complications: Secondary | ICD-10-CM | POA: Diagnosis not present

## 2018-04-22 DIAGNOSIS — I10 Essential (primary) hypertension: Secondary | ICD-10-CM | POA: Diagnosis not present

## 2018-04-27 DIAGNOSIS — F0280 Dementia in other diseases classified elsewhere without behavioral disturbance: Secondary | ICD-10-CM | POA: Diagnosis not present

## 2018-04-27 DIAGNOSIS — S066X1D Traumatic subarachnoid hemorrhage with loss of consciousness of 30 minutes or less, subsequent encounter: Secondary | ICD-10-CM | POA: Diagnosis not present

## 2018-04-27 DIAGNOSIS — E039 Hypothyroidism, unspecified: Secondary | ICD-10-CM | POA: Diagnosis not present

## 2018-04-27 DIAGNOSIS — I10 Essential (primary) hypertension: Secondary | ICD-10-CM | POA: Diagnosis not present

## 2018-04-27 DIAGNOSIS — E119 Type 2 diabetes mellitus without complications: Secondary | ICD-10-CM | POA: Diagnosis not present

## 2018-06-14 ENCOUNTER — Encounter (HOSPITAL_COMMUNITY): Payer: Self-pay | Admitting: Emergency Medicine

## 2018-06-14 ENCOUNTER — Observation Stay (HOSPITAL_COMMUNITY): Payer: PPO

## 2018-06-14 ENCOUNTER — Observation Stay (HOSPITAL_BASED_OUTPATIENT_CLINIC_OR_DEPARTMENT_OTHER): Payer: PPO

## 2018-06-14 ENCOUNTER — Emergency Department (HOSPITAL_COMMUNITY): Payer: PPO

## 2018-06-14 ENCOUNTER — Inpatient Hospital Stay (HOSPITAL_COMMUNITY)
Admission: EM | Admit: 2018-06-14 | Discharge: 2018-06-16 | DRG: 101 | Disposition: A | Discharge status: Home / Self Care | Payer: PPO | Attending: Internal Medicine | Admitting: Internal Medicine

## 2018-06-14 ENCOUNTER — Other Ambulatory Visit: Payer: Self-pay

## 2018-06-14 DIAGNOSIS — I10 Essential (primary) hypertension: Secondary | ICD-10-CM | POA: Diagnosis not present

## 2018-06-14 DIAGNOSIS — E119 Type 2 diabetes mellitus without complications: Secondary | ICD-10-CM | POA: Diagnosis not present

## 2018-06-14 DIAGNOSIS — S199XXA Unspecified injury of neck, initial encounter: Secondary | ICD-10-CM | POA: Diagnosis not present

## 2018-06-14 DIAGNOSIS — R52 Pain, unspecified: Secondary | ICD-10-CM

## 2018-06-14 DIAGNOSIS — R269 Unspecified abnormalities of gait and mobility: Secondary | ICD-10-CM | POA: Diagnosis not present

## 2018-06-14 DIAGNOSIS — F0390 Unspecified dementia without behavioral disturbance: Secondary | ICD-10-CM | POA: Diagnosis not present

## 2018-06-14 DIAGNOSIS — R296 Repeated falls: Secondary | ICD-10-CM | POA: Diagnosis not present

## 2018-06-14 DIAGNOSIS — G309 Alzheimer's disease, unspecified: Secondary | ICD-10-CM | POA: Diagnosis not present

## 2018-06-14 DIAGNOSIS — E785 Hyperlipidemia, unspecified: Secondary | ICD-10-CM | POA: Diagnosis not present

## 2018-06-14 DIAGNOSIS — K219 Gastro-esophageal reflux disease without esophagitis: Secondary | ICD-10-CM | POA: Diagnosis present

## 2018-06-14 DIAGNOSIS — R410 Disorientation, unspecified: Secondary | ICD-10-CM | POA: Diagnosis not present

## 2018-06-14 DIAGNOSIS — Z888 Allergy status to other drugs, medicaments and biological substances status: Secondary | ICD-10-CM

## 2018-06-14 DIAGNOSIS — W19XXXA Unspecified fall, initial encounter: Secondary | ICD-10-CM | POA: Diagnosis present

## 2018-06-14 DIAGNOSIS — G40909 Epilepsy, unspecified, not intractable, without status epilepticus: Secondary | ICD-10-CM | POA: Diagnosis not present

## 2018-06-14 DIAGNOSIS — R55 Syncope and collapse: Secondary | ICD-10-CM | POA: Diagnosis present

## 2018-06-14 DIAGNOSIS — G301 Alzheimer's disease with late onset: Secondary | ICD-10-CM | POA: Diagnosis not present

## 2018-06-14 DIAGNOSIS — E1151 Type 2 diabetes mellitus with diabetic peripheral angiopathy without gangrene: Secondary | ICD-10-CM | POA: Diagnosis present

## 2018-06-14 DIAGNOSIS — R000 Tachycardia, unspecified: Secondary | ICD-10-CM | POA: Diagnosis not present

## 2018-06-14 DIAGNOSIS — S79911A Unspecified injury of right hip, initial encounter: Secondary | ICD-10-CM | POA: Diagnosis not present

## 2018-06-14 DIAGNOSIS — Z85828 Personal history of other malignant neoplasm of skin: Secondary | ICD-10-CM

## 2018-06-14 DIAGNOSIS — I951 Orthostatic hypotension: Secondary | ICD-10-CM

## 2018-06-14 DIAGNOSIS — E0590 Thyrotoxicosis, unspecified without thyrotoxic crisis or storm: Secondary | ICD-10-CM | POA: Diagnosis present

## 2018-06-14 DIAGNOSIS — R413 Other amnesia: Secondary | ICD-10-CM | POA: Diagnosis not present

## 2018-06-14 DIAGNOSIS — F419 Anxiety disorder, unspecified: Secondary | ICD-10-CM | POA: Diagnosis present

## 2018-06-14 DIAGNOSIS — R159 Full incontinence of feces: Secondary | ICD-10-CM | POA: Diagnosis present

## 2018-06-14 DIAGNOSIS — Z8349 Family history of other endocrine, nutritional and metabolic diseases: Secondary | ICD-10-CM | POA: Diagnosis not present

## 2018-06-14 DIAGNOSIS — R569 Unspecified convulsions: Secondary | ICD-10-CM

## 2018-06-14 DIAGNOSIS — M25551 Pain in right hip: Secondary | ICD-10-CM | POA: Diagnosis not present

## 2018-06-14 DIAGNOSIS — Z8249 Family history of ischemic heart disease and other diseases of the circulatory system: Secondary | ICD-10-CM | POA: Diagnosis not present

## 2018-06-14 DIAGNOSIS — D680 Von Willebrand's disease: Secondary | ICD-10-CM | POA: Diagnosis present

## 2018-06-14 DIAGNOSIS — F0280 Dementia in other diseases classified elsewhere without behavioral disturbance: Secondary | ICD-10-CM | POA: Diagnosis not present

## 2018-06-14 DIAGNOSIS — E039 Hypothyroidism, unspecified: Secondary | ICD-10-CM | POA: Diagnosis present

## 2018-06-14 DIAGNOSIS — Z79899 Other long term (current) drug therapy: Secondary | ICD-10-CM | POA: Diagnosis not present

## 2018-06-14 DIAGNOSIS — Y92009 Unspecified place in unspecified non-institutional (private) residence as the place of occurrence of the external cause: Secondary | ICD-10-CM | POA: Diagnosis not present

## 2018-06-14 DIAGNOSIS — Z8261 Family history of arthritis: Secondary | ICD-10-CM | POA: Diagnosis not present

## 2018-06-14 DIAGNOSIS — Z79890 Hormone replacement therapy (postmenopausal): Secondary | ICD-10-CM | POA: Diagnosis not present

## 2018-06-14 DIAGNOSIS — F329 Major depressive disorder, single episode, unspecified: Secondary | ICD-10-CM | POA: Diagnosis present

## 2018-06-14 DIAGNOSIS — S0990XA Unspecified injury of head, initial encounter: Secondary | ICD-10-CM | POA: Diagnosis not present

## 2018-06-14 DIAGNOSIS — Z66 Do not resuscitate: Secondary | ICD-10-CM | POA: Diagnosis not present

## 2018-06-14 DIAGNOSIS — R0602 Shortness of breath: Secondary | ICD-10-CM | POA: Diagnosis not present

## 2018-06-14 LAB — CBC WITH DIFFERENTIAL/PLATELET
Abs Immature Granulocytes: 0.06 10*3/uL (ref 0.00–0.07)
Basophils Absolute: 0 10*3/uL (ref 0.0–0.1)
Basophils Relative: 1 %
Eosinophils Absolute: 0.1 10*3/uL (ref 0.0–0.5)
Eosinophils Relative: 1 %
HCT: 42.2 % (ref 36.0–46.0)
Hemoglobin: 13 g/dL (ref 12.0–15.0)
Immature Granulocytes: 1 %
Lymphocytes Relative: 23 %
Lymphs Abs: 1.9 10*3/uL (ref 0.7–4.0)
MCH: 26.3 pg (ref 26.0–34.0)
MCHC: 30.8 g/dL (ref 30.0–36.0)
MCV: 85.4 fL (ref 80.0–100.0)
Monocytes Absolute: 0.5 10*3/uL (ref 0.1–1.0)
Monocytes Relative: 6 %
Neutro Abs: 6 10*3/uL (ref 1.7–7.7)
Neutrophils Relative %: 68 %
Platelets: 221 10*3/uL (ref 150–400)
RBC: 4.94 MIL/uL (ref 3.87–5.11)
RDW: 14.5 % (ref 11.5–15.5)
WBC: 8.6 10*3/uL (ref 4.0–10.5)
nRBC: 0 % (ref 0.0–0.2)

## 2018-06-14 LAB — URINALYSIS, ROUTINE W REFLEX MICROSCOPIC
Bacteria, UA: NONE SEEN
Bilirubin Urine: NEGATIVE
Glucose, UA: NEGATIVE mg/dL
Ketones, ur: NEGATIVE mg/dL
Leukocytes, UA: NEGATIVE
Nitrite: NEGATIVE
Protein, ur: NEGATIVE mg/dL
Specific Gravity, Urine: 1.02 (ref 1.005–1.030)
pH: 5 (ref 5.0–8.0)

## 2018-06-14 LAB — I-STAT ARTERIAL BLOOD GAS, ED
Bicarbonate: 23.1 mmol/L (ref 20.0–28.0)
O2 Saturation: 96 %
Patient temperature: 98.6
TCO2: 24 mmol/L (ref 22–32)
pCO2 arterial: 30.5 mmHg — ABNORMAL LOW (ref 32.0–48.0)
pH, Arterial: 7.488 — ABNORMAL HIGH (ref 7.350–7.450)
pO2, Arterial: 72 mmHg — ABNORMAL LOW (ref 83.0–108.0)

## 2018-06-14 LAB — I-STAT CHEM 8, ED
BUN: 17 mg/dL (ref 8–23)
Calcium, Ion: 1.05 mmol/L — ABNORMAL LOW (ref 1.15–1.40)
Chloride: 107 mmol/L (ref 98–111)
Creatinine, Ser: 1.1 mg/dL — ABNORMAL HIGH (ref 0.44–1.00)
Glucose, Bld: 115 mg/dL — ABNORMAL HIGH (ref 70–99)
HCT: 39 % (ref 36.0–46.0)
Hemoglobin: 13.3 g/dL (ref 12.0–15.0)
Potassium: 4 mmol/L (ref 3.5–5.1)
Sodium: 138 mmol/L (ref 135–145)
TCO2: 23 mmol/L (ref 22–32)

## 2018-06-14 LAB — TSH: TSH: 4.032 u[IU]/mL (ref 0.350–4.500)

## 2018-06-14 LAB — ECHOCARDIOGRAM COMPLETE
Height: 66 in
Weight: 2419.77 oz

## 2018-06-14 LAB — I-STAT TROPONIN, ED: Troponin i, poc: 0.01 ng/mL (ref 0.00–0.08)

## 2018-06-14 MED ORDER — ONDANSETRON HCL 4 MG/2ML IJ SOLN: 4.0000 mg | Freq: Four times a day (QID) | INTRAMUSCULAR | Status: DC | PRN

## 2018-06-14 MED ORDER — PANTOPRAZOLE SODIUM 40 MG PO TBEC
40.0000 mg | DELAYED_RELEASE_TABLET | Freq: Every day | ORAL | Status: DC
  Administered 2018-06-14 – 2018-06-16 (×3): 40 mg via ORAL
  Filled 2018-06-14 (×3): qty 1

## 2018-06-14 MED ORDER — FLUDROCORTISONE ACETATE 0.1 MG PO TABS
0.1000 mg | ORAL_TABLET | Freq: Every day | ORAL | Status: DC
  Administered 2018-06-14 – 2018-06-16 (×3): 0.1 mg via ORAL
  Filled 2018-06-14 (×4): qty 1

## 2018-06-14 MED ORDER — LEVOTHYROXINE SODIUM 88 MCG PO TABS
88.0000 ug | ORAL_TABLET | Freq: Every day | ORAL | Status: DC
  Administered 2018-06-14 – 2018-06-16 (×3): 88 ug via ORAL
  Filled 2018-06-14 (×3): qty 1

## 2018-06-14 MED ORDER — SERTRALINE HCL 50 MG PO TABS
50.0000 mg | ORAL_TABLET | Freq: Every day | ORAL | Status: DC
  Administered 2018-06-14 – 2018-06-15 (×2): 50 mg via ORAL
  Filled 2018-06-14 (×2): qty 1

## 2018-06-14 MED ORDER — ENOXAPARIN SODIUM 40 MG/0.4ML ~~LOC~~ SOLN
40.0000 mg | SUBCUTANEOUS | Status: DC
  Administered 2018-06-14 – 2018-06-16 (×3): 40 mg via SUBCUTANEOUS
  Filled 2018-06-14 (×3): qty 0.4

## 2018-06-14 MED ORDER — ACETAMINOPHEN 325 MG PO TABS
650.0000 mg | ORAL_TABLET | Freq: Four times a day (QID) | ORAL | Status: DC | PRN
  Administered 2018-06-14: 650 mg via ORAL
  Filled 2018-06-14: qty 2

## 2018-06-14 MED ORDER — DONEPEZIL HCL 5 MG PO TABS
5.0000 mg | ORAL_TABLET | Freq: Every day | ORAL | Status: DC
  Administered 2018-06-15: 5 mg via ORAL
  Filled 2018-06-14 (×2): qty 1

## 2018-06-14 MED ORDER — POLYETHYLENE GLYCOL 3350 17 G PO PACK: 17.0000 g | PACK | Freq: Every day | ORAL | Status: DC | PRN

## 2018-06-14 MED ORDER — HYDROCODONE-ACETAMINOPHEN 5-325 MG PO TABS
1.0000 | ORAL_TABLET | Freq: Four times a day (QID) | ORAL | Status: DC | PRN
  Administered 2018-06-16: 1 via ORAL
  Filled 2018-06-14: qty 1

## 2018-06-14 MED ORDER — BISACODYL 10 MG RE SUPP: 10.0000 mg | Freq: Every day | RECTAL | Status: DC | PRN

## 2018-06-14 MED ORDER — PERFLUTREN LIPID MICROSPHERE
1.0000 mL | INTRAVENOUS | Status: AC | PRN
  Administered 2018-06-14: 5 mL via INTRAVENOUS
  Filled 2018-06-14: qty 10

## 2018-06-14 MED ORDER — ALUM & MAG HYDROXIDE-SIMETH 200-200-20 MG/5ML PO SUSP
15.0000 mL | ORAL | Status: DC | PRN
  Administered 2018-06-14 – 2018-06-15 (×2): 15 mL via ORAL
  Filled 2018-06-14 (×2): qty 30

## 2018-06-14 MED ORDER — NYSTATIN 100000 UNIT/GM EX POWD
Freq: Every day | CUTANEOUS | Status: DC | PRN
  Filled 2018-06-14: qty 15

## 2018-06-14 MED ORDER — ONDANSETRON HCL 4 MG PO TABS: 4.0000 mg | ORAL_TABLET | Freq: Four times a day (QID) | ORAL | Status: DC | PRN

## 2018-06-14 NOTE — Progress Notes (Signed)
Unable to complete pt orthostatic vital signs at this time  Will attempt later

## 2018-06-14 NOTE — ED Notes (Signed)
ED TO INPATIENT HANDOFF REPORT  Name/Age/Gender Stacy Davis 73 y.o. female  Code Status    Code Status Orders  (From admission, onward)         Start     Ordered   06/14/18 0852  Full code  Continuous     06/14/18 0854        Code Status History    Date Active Date Inactive Code Status Order ID Comments User Context   04/12/2018 0001 04/13/2018 1641 DNR [id number]  Ivor Costa, MD ED   12/01/2014 1528 12/01/2014 1930 Full Code [id number]  Hessie Knows, MD Inpatient      Home/SNF/Other Home  Chief Complaint fall  Level of Care/Admitting Diagnosis ED Disposition    ED Disposition Condition [place]: [facility] [100100]  Level of Care: Cardiac Telemetry [103]  I expect the patient will be discharged within 24 hours: No (not a candidate for 5C-Observation unit)  Diagnosis: Syncope [206001]  Admitting Physician: Guilford Shi [[id number]]  Attending Physician: Guilford Shi [[id number]]  PT Class (Do Not Modify): Observation [104]  PT Acc Code (Do Not Modify): Observation [10022]       Medical History Past Medical History:  Diagnosis Date  . Anxiety   . Arthritis    ?OA vs Rheum (established with Dr. Jefm Bryant pending blood work)  . Articular cartilage disorder of shoulder 04/2012   right  . Back pain    h/o DDD since 2010  . Dementia ([place])   . Dental crowns present    also dental caps  . Diabetes mellitus    NIDDM  . Fecal incontinence   . GERD (gastroesophageal reflux disease)   . Headache(784.0)    occasional  . History of skin cancer   . Hyperlipidemia    no current med.  . Hypertension   . Hyperthyroidism   . Hypothyroid   . Impingement syndrome of right shoulder 04/2012  . Memory impairment    dementia- worsenign 12/2016 with medication non compliance and delusions.    . Right rotator cuff tear 05/15/2012  . Rotator cuff rupture 04/2012   right  . Von Willebrand disease ([place])      Allergies Allergies  Allergen Reactions  . Tramadol Other (See Comments)    CHANGES IN MEMORY    IV Location/Drains/Wounds Patient Lines/Drains/Airways Status   Active Line/Drains/Airways    Name:   Placement date:   Placement time:   Site:   Days:   Incision (Closed) 12/01/14 Knee Right   12/01/14    1345     1291          Labs/Imaging Results for orders placed or performed during the hospital encounter of 06/14/18 (from the past 48 hour(s))  CBC with Differential/Platelet     Status: None   Collection Time: 06/14/18  4:27 AM  Result Value Ref Range   WBC 8.6 4.0 - 10.5 K/uL   RBC 4.94 3.87 - 5.11 MIL/uL   Hemoglobin 13.0 12.0 - 15.0 g/dL   HCT 42.2 36.0 - 46.0 %   MCV 85.4 80.0 - 100.0 fL   MCH 26.3 26.0 - 34.0 pg   MCHC 30.8 30.0 - 36.0 g/dL   RDW 14.5 11.5 - 15.5 %   Platelets 221 150 - 400 K/uL   nRBC 0.0 0.0 - 0.2 %   Neutrophils Relative % 68 %   Neutro Abs 6.0 1.7 - 7.7 K/uL   Lymphocytes Relative 23 %  Lymphs Abs 1.9 0.7 - 4.0 K/uL   Monocytes Relative 6 %   Monocytes Absolute 0.5 0.1 - 1.0 K/uL   Eosinophils Relative 1 %   Eosinophils Absolute 0.1 0.0 - 0.5 K/uL   Basophils Relative 1 %   Basophils Absolute 0.0 0.0 - 0.1 K/uL   Immature Granulocytes 1 %   Abs Immature Granulocytes 0.06 0.00 - 0.07 K/uL    Comment: Performed at [facility] [address] [place], [place] [address]  I-stat troponin, ED     Status: None   Collection Time: 06/14/18  4:39 AM  Result Value Ref Range   Troponin i, poc 0.01 0.00 - 0.08 ng/mL   Comment 3            Comment: Due to the release kinetics of cTnI, a negative result within the first hours of the onset of symptoms does not rule out myocardial infarction with certainty. If myocardial infarction is still suspected, repeat the test at appropriate intervals.   I-Stat Chem 8, ED     Status: Abnormal   Collection Time: 06/14/18  4:42 AM  Result Value Ref Range   Sodium 138 135 - 145 mmol/L   Potassium  4.0 3.5 - 5.1 mmol/L   Chloride 107 98 - 111 mmol/L   BUN 17 8 - 23 mg/dL   Creatinine, Ser 1.10 (H) 0.44 - 1.00 mg/dL   Glucose, Bld 115 (H) 70 - 99 mg/dL   Calcium, Ion 1.05 (L) 1.15 - 1.40 mmol/L   TCO2 23 22 - 32 mmol/L   Hemoglobin 13.3 12.0 - 15.0 g/dL   HCT 39.0 36.0 - 46.0 %  Urinalysis, Routine w reflex microscopic     Status: Abnormal   Collection Time: 06/14/18  6:06 AM  Result Value Ref Range   Color, Urine YELLOW YELLOW   APPearance CLEAR CLEAR   Specific Gravity, Urine 1.020 1.005 - 1.030   pH 5.0 5.0 - 8.0   Glucose, UA NEGATIVE NEGATIVE mg/dL   Hgb urine dipstick SMALL (A) NEGATIVE   Bilirubin Urine NEGATIVE NEGATIVE   Ketones, ur NEGATIVE NEGATIVE mg/dL   Protein, ur NEGATIVE NEGATIVE mg/dL   Nitrite NEGATIVE NEGATIVE   Leukocytes, UA NEGATIVE NEGATIVE   RBC / HPF 0-5 0 - 5 RBC/hpf   WBC, UA 0-5 0 - 5 WBC/hpf   Bacteria, UA NONE SEEN NONE SEEN   Mucus PRESENT    Hyaline Casts, UA PRESENT     Comment: Performed at [facility], [address] [place], [place] [address]  I-Stat arterial blood gas, ED     Status: Abnormal   Collection Time: 06/14/18  8:01 AM  Result Value Ref Range   pH, Arterial 7.488 (H) 7.350 - 7.450   pCO2 arterial 30.5 (L) 32.0 - 48.0 mmHg   pO2, Arterial 72.0 (L) 83.0 - 108.0 mmHg   Bicarbonate 23.1 20.0 - 28.0 mmol/L   TCO2 24 22 - 32 mmol/L   O2 Saturation 96.0 %   Patient temperature 98.6 F    Collection site RADIAL, ALLEN'S TEST ACCEPTABLE    Drawn by RT    Sample type ARTERIAL    Dg Chest 2 View  Result Date: 06/14/2018 CLINICAL DATA:  Syncopal episode EXAM: CHEST - 2 VIEW COMPARISON:  04/11/2018 chest radiograph. FINDINGS: Stable cardiomediastinal silhouette with borderline mild cardiomegaly. No pneumothorax. No pleural effusion. Lungs appear clear, with no acute consolidative airspace disease and no pulmonary edema. Visualized osseous structures appear intact. IMPRESSION:  Borderline mild cardiomegaly. No pulmonary edema.  No active pulmonary disease. Electronically Signed   By: Ilona Sorrel M.D.   On: 06/14/2018 05:22   Ct Head Wo Contrast  Result Date: 06/14/2018 CLINICAL DATA:  Syncopal episode with fall at home. EXAM: CT HEAD WITHOUT CONTRAST CT CERVICAL SPINE WITHOUT CONTRAST TECHNIQUE: Multidetector CT imaging of the head and cervical spine was performed following the standard protocol without intravenous contrast. Multiplanar CT image reconstructions of the cervical spine were also generated. COMPARISON:  04/12/2018 head CT. 04/11/2018 head and cervical spine CT. FINDINGS: CT HEAD FINDINGS Brain: No evidence of parenchymal hemorrhage or extra-axial fluid collection. No mass lesion, mass effect, or midline shift. No CT evidence of acute infarction. Nonspecific mild subcortical and periventricular white matter hypodensity, most in keeping with chronic small vessel ischemic change. Cerebral volume is age appropriate. No ventriculomegaly. Vascular: No acute abnormality. Skull: No evidence of calvarial fracture. Sinuses/Orbits: The visualized paranasal sinuses are essentially clear. Other:  The mastoid air cells are unopacified. CT CERVICAL SPINE FINDINGS Alignment: Straightening of the cervical spine. No facet subluxation. Dens is well positioned between the lateral masses of C1. Stable 3 mm anterolisthesis at C3-4. Skull base and vertebrae: No acute fracture. No primary bone lesion or focal pathologic process. Soft tissues and spinal canal: No prevertebral edema. No visible canal hematoma. Disc levels: Severe multilevel degenerative disc disease throughout the cervical spine, unchanged. Advanced bilateral facet arthropathy. Moderate degenerative foraminal stenosis bilaterally at C5-6 and on the right at C6-7. Upper chest: No acute abnormality. Other: Visualized mastoid air cells appear clear. No discrete thyroid nodules. No pathologically enlarged cervical nodes. IMPRESSION: 1. No evidence of acute intracranial abnormality. No  evidence of calvarial fracture. 2. Mild chronic small vessel ischemic changes in the cerebral white matter. 3. No cervical spine fracture or facet subluxation. 4. Severe degenerative changes in the cervical spine as detailed. Electronically Signed   By: Ilona Sorrel M.D.   On: 06/14/2018 05:33   Ct Cervical Spine Wo Contrast  Result Date: 06/14/2018 CLINICAL DATA:  Syncopal episode with fall at home. EXAM: CT HEAD WITHOUT CONTRAST CT CERVICAL SPINE WITHOUT CONTRAST TECHNIQUE: Multidetector CT imaging of the head and cervical spine was performed following the standard protocol without intravenous contrast. Multiplanar CT image reconstructions of the cervical spine were also generated. COMPARISON:  04/12/2018 head CT. 04/11/2018 head and cervical spine CT. FINDINGS: CT HEAD FINDINGS Brain: No evidence of parenchymal hemorrhage or extra-axial fluid collection. No mass lesion, mass effect, or midline shift. No CT evidence of acute infarction. Nonspecific mild subcortical and periventricular white matter hypodensity, most in keeping with chronic small vessel ischemic change. Cerebral volume is age appropriate. No ventriculomegaly. Vascular: No acute abnormality. Skull: No evidence of calvarial fracture. Sinuses/Orbits: The visualized paranasal sinuses are essentially clear. Other:  The mastoid air cells are unopacified. CT CERVICAL SPINE FINDINGS Alignment: Straightening of the cervical spine. No facet subluxation. Dens is well positioned between the lateral masses of C1. Stable 3 mm anterolisthesis at C3-4. Skull base and vertebrae: No acute fracture. No primary bone lesion or focal pathologic process. Soft tissues and spinal canal: No prevertebral edema. No visible canal hematoma. Disc levels: Severe multilevel degenerative disc disease throughout the cervical spine, unchanged. Advanced bilateral facet arthropathy. Moderate degenerative foraminal stenosis bilaterally at C5-6 and on the right at C6-7. Upper chest:  No acute abnormality. Other: Visualized mastoid air cells appear clear. No discrete thyroid nodules. No pathologically enlarged cervical nodes. IMPRESSION: 1. No evidence of acute intracranial  abnormality. No evidence of calvarial fracture. 2. Mild chronic small vessel ischemic changes in the cerebral white matter. 3. No cervical spine fracture or facet subluxation. 4. Severe degenerative changes in the cervical spine as detailed. Electronically Signed   By: Ilona Sorrel M.D.   On: 06/14/2018 05:33   Dg Hip Unilat With Pelvis 2-3 Views Right  Result Date: 06/14/2018 CLINICAL DATA:  Syncopal episode with fall and right hip pain EXAM: DG HIP (WITH OR WITHOUT PELVIS) 2-3V RIGHT COMPARISON:  None. FINDINGS: No pelvic fracture or diastasis. Surgical hardware overlies the lower lumbar spine. No right hip fracture or dislocation. Minimal osteoarthritis in both hip joints. No suspicious focal osseous lesions. Small enthesophytes at the right greater trochanter. IMPRESSION: No fracture or malalignment. Electronically Signed   By: Ilona Sorrel M.D.   On: 06/14/2018 05:21   EKG Interpretation  Date/Time:  Sunday June 14 2018 04:14:43 EST Ventricular Rate:  96 PR Interval:    QRS Duration: 121 QT Interval:  379 QTC Calculation: 479 R Axis:   -18 Text Interpretation:  Normal sinus rhythm Nonspecific intraventricular conduction delay Baseline wander in lead(s) V2 Confirmed by Palumbo, April ([id number]) on 06/14/2018 4:17:29 AM   Pending Labs Unresulted Labs (From admission, onward)    Start     Ordered   06/21/18 0500  Creatinine, serum  (enoxaparin (LOVENOX)    CrCl >/= 30 ml/min)  Weekly,   R    Comments:  while on enoxaparin therapy    06/14/18 0854   06/15/18 0500  Basic metabolic panel  Tomorrow morning,   R     06/14/18 0854   06/14/18 0857  TSH  Once,   R     01 /19/20 0856   06/14/18 0731  Blood gas, arterial  Once,   STAT    Question:  Room air or oxygen  Answer:  Room air   06/14/18 0730           Vitals/Pain Today's Vitals   06/14/18 0600 06/14/18 0615 06/14/18 0715 06/14/18 0905  BP: 125/86 127/69 119/66 114/68  Pulse: 88 88 88 90  Resp: 16 17 (!) 21 16  Temp:      TempSrc:      SpO2: 98% 98% 97% 98%  Weight:      Height:        Isolation Precautions No active isolations  Medications Medications  sertraline (ZOLOFT) tablet 50 mg (has no administration in time range)  levothyroxine (SYNTHROID, LEVOTHROID) tablet 88 mcg (has no administration in time range)  pantoprazole (PROTONIX) EC tablet 40 mg (has no administration in time range)  nystatin (MYCOSTATIN/NYSTOP) topical powder (has no administration in time range)  enoxaparin (LOVENOX) injection 40 mg (has no administration in time range)  polyethylene glycol (MIRALAX / GLYCOLAX) packet 17 g (has no administration in time range)  bisacodyl (DULCOLAX) suppository 10 mg (has no administration in time range)  ondansetron (ZOFRAN) tablet 4 mg (has no administration in time range)    Or  ondansetron (ZOFRAN) injection 4 mg (has no administration in time range)    Mobility walks with device

## 2018-06-14 NOTE — ED Notes (Signed)
Patient transported to X-ray 

## 2018-06-14 NOTE — H&P (Addendum)
History and Physical    DOA: 06/14/2018  PCP: Susy Frizzle, MD  Patient coming from: home  Chief Complaint: Syncope/collapse  HPI: Stacy Davis is a 73 y.o. female with history h/o hypertension, hyperlipidemia, Von Willebrand disease, diet-controlled diabetes mellitus type 2, GERD, hypothyroidism, anxiety, dementia presented with a syncopal/collapse episode.  Patient was admitted in August 2019 with similar presentation.  Patient has dementia with memory issues.  She is pleasant and communicative but oriented to place and person only.  Most of the history obtained by talking to husband bedside who is the primary caregiver for her.  According to the husband patient has had 3 episodes so far where she suddenly loses consciousness and suffers a fall.  The first episode (the only witnessed episode by her husband) happened in early 2019 when patient apparently was walking around the bed and suddenly collapsed with drooling from the mouth and jerking movements in all extremities.  There is no report of bowel or bladder incontinence or tongue bite during this episode.  The second episode happened in August when patient collapsed on the kitchen floor and husband again found her unconscious with jerking movements in extremities.  She was admitted to the hospital and seen by neurosurgery for subarachnoid hemorrhage that was noted on CT as a consequence of the fall.  The third episode happened around 2 AM last night when patient was found on the edge of the bed and the sound of her fall woke husband up.  Patient however did not have any jerking movements this time but appeared to have agonal and coarse respirations.  Firemen apparently arrived before EMS and bagged her.  Upon EMS arrival within 10 minutes patient was awake and responding.CBG on EMS eval 160, o2 sat 96% RA.  Patient currently denies any headache or joint pains.  She does not recollect falling or losing consciousness.  She believes she was  trying to get up to use the bathroom.  Of note, patient does have a history of SVT which husband describes as "panic attack" for which she sees Dr. Quay Burow.  She denies any complaints of chest pain or palpitations or dizzy spells preceding the episodes.  Work-up in the ED today unremarkable with CT head : no acute abnormality, CT cervical spine : severe DDD , right hip x ray -ve for fractures/dislocation. Labs wnl.  She is requested to be admitted for further evaluation and management.  Review of Systems: As per HPI otherwise 10 point review of systems negative.    Past Medical History:  Diagnosis Date  . Anxiety   . Arthritis    ?OA vs Rheum (established with Dr. Jefm Bryant pending blood work)  . Articular cartilage disorder of shoulder 04/2012   right  . Back pain    h/o DDD since 2010  . Dementia ([place])   . Dental crowns present    also dental caps  . Diabetes mellitus    NIDDM  . Fecal incontinence   . GERD (gastroesophageal reflux disease)   . Headache(784.0)    occasional  . History of skin cancer   . Hyperlipidemia    no current med.  . Hypertension   . Hyperthyroidism   . Hypothyroid   . Impingement syndrome of right shoulder 04/2012  . Memory impairment    dementia- worsenign 12/2016 with medication non compliance and delusions.    . Right rotator cuff tear 05/15/2012  . Rotator cuff rupture 04/2012   right  . Von Willebrand disease (  [facility])     Past Surgical History:  Procedure Laterality Date  . APPENDECTOMY    . BREAST SURGERY     hematoma evacuation due to trauma  . CARDIAC CATHETERIZATION  03/06/2001  . CESAREAN SECTION     x 2  . COLONOSCOPY  01/20/2006 per patient  . FRACTURE SURGERY    . KNEE ARTHROSCOPY Right 12/01/2014   Procedure: ARTHROSCOPY KNEE,partial medial menisectomy and plica excision;  Surgeon: Hessie Knows, MD;  Location: [facility] ORS;  Service: Orthopedics;  Laterality: Right;  . LUMBAR LAMINECTOMY/DECOMPRESSION MICRODISCECTOMY  03/01/2009    right L4-5; arthrodesis L4-5  . ORIF WRIST FRACTURE     left  . SHOULDER ARTHROSCOPY WITH ROTATOR CUFF REPAIR AND SUBACROMIAL DECOMPRESSION  05/15/2012   Procedure: SHOULDER ARTHROSCOPY WITH ROTATOR CUFF REPAIR AND SUBACROMIAL DECOMPRESSION;  Surgeon: Johnny Bridge, MD;  Location: [facility];  Service: Orthopedics;  Laterality: Right;  RIGHT ARTHROSCOPY SHOULDER DEBRIDEMENT LIMITED, ARTHROSCOPY SHOULDER DECOMPRESSION SUBACROMIAL PARTIAL ACROMIOPLASTY WITH CORACOACROMIAL RELEASE, ARTHROSCOPY SHOULDER WITH ROTATOR CUFF REPAIR  . TUBAL LIGATION      Social history:  reports that she has never smoked. She has never used smokeless tobacco. She reports that she does not drink alcohol or use drugs.   Allergies  Allergen Reactions  . Tramadol Other (See Comments)    CHANGES IN MEMORY    Family History  Problem Relation Age of Onset  . Dementia Mother   . Arthritis Mother   . Cancer Father        unknown primary  . Heart disease Father   . Thyroid disease Sister   . Cancer Sister        brain tumor  . Dementia Sister   . COPD Brother   . Cancer Brother        bone cancer  . Colon cancer Paternal Grandfather   . Breast cancer Neg Hx       Prior to Admission medications   Medication Sig Start Date End Date Taking? Authorizing Provider  levothyroxine (SYNTHROID, LEVOTHROID) 88 MCG tablet TAKE 1 TABLET BY MOUTH ONCE A DAY Patient taking differently: Take 88 mcg by mouth daily.  04/14/18  Yes Susy Frizzle, MD  nystatin (NYSTATIN) powder Apply topically daily as needed (under belly rash).   Yes [provider]  nystatin cream (MYCOSTATIN) Apply 1 application topically daily as needed (under belly rash).   Yes [provider]  omeprazole (PRILOSEC) 40 MG capsule TAKE 1 CAPSULE BY MOUTH ONCE DAILY Patient taking differently: Take 40 mg by mouth daily after supper.  09/08/17  Yes Susy Frizzle, MD  OVER THE COUNTER MEDICATION Take 2 tablets by  mouth 2 (two) times daily as needed (pain). "Back and Body" over the counter   Yes [provider]  sertraline (ZOLOFT) 50 MG tablet TAKE 1 TABLET BY MOUTH ONCE A DAY Patient taking differently: Take 50 mg by mouth daily after supper.  03/19/18  Yes Susy Frizzle, MD  clotrimazole (CLOTRIMAZOLE GRX) 1 % cream Apply 1 application topically 2 (two) times daily. Patient not taking: Reported on 06/14/2018 04/06/18   Jerene Bears, MD    Physical Exam: Vitals:   06/14/18 0530 06/14/18 0545 06/14/18 0600 06/14/18 0615  BP: 118/75 109/79 125/86 127/69  Pulse: 90 93 88 88  Resp: 14 18 16 17   Temp:      TempSrc:      SpO2: 95% 97% 98% 98%  Weight:      Height:  Constitutional: NAD, calm, comfortable Eyes: PERRL, lids and conjunctivae normal ENMT: Mucous membranes are moist. Posterior pharynx clear of any exudate or lesions.Normal dentition.  Neck: normal, supple, no masses, no thyromegaly Respiratory: clear to auscultation bilaterally, no wheezing, no crackles. Normal respiratory effort. No accessory muscle use.  Cardiovascular: Regular rate and rhythm, no murmurs / rubs / gallops. No extremity edema. 2+ pedal pulses. No carotid bruits.  Abdomen: no tenderness, no masses palpated. No hepatosplenomegaly. Bowel sounds positive.  Musculoskeletal: no clubbing / cyanosis. No joint deformity upper and lower extremities. Good ROM, no contractures. Normal muscle tone.  Neurologic: Awake alert oriented x2 CN 2-12 grossly intact. Sensation intact, DTR normal. Strength 5/5 in all 4.  Psychiatric: . Alert and oriented x 2. Normal mood.  SKIN/catheters: no rashes, lesions, ulcers. No induration  Labs on Admission: I have personally reviewed following labs and imaging studies  CBC: Recent Labs  Lab 06/14/18 0427 06/14/18 0442  WBC 8.6  --   NEUTROABS 6.0  --   HGB 13.0 13.3  HCT 42.2 39.0  MCV 85.4  --   PLT 221  --    Basic Metabolic Panel: Recent Labs  Lab 06/14/18 0442    NA 138  K 4.0  CL 107  GLUCOSE 115*  BUN 17  CREATININE 1.10*   GFR: Estimated Creatinine Clearance: 46.7 mL/min (A) (by C-G formula based on SCr of 1.1 mg/dL (H)). Liver Function Tests: No results for input(s): AST, ALT, ALKPHOS, BILITOT, PROT, ALBUMIN in the last 168 hours. No results for input(s): LIPASE, AMYLASE in the last 168 hours. No results for input(s): AMMONIA in the last 168 hours. Coagulation Profile: No results for input(s): INR, PROTIME in the last 168 hours. Cardiac Enzymes: No results for input(s): CKTOTAL, CKMB, CKMBINDEX, TROPONINI in the last 168 hours. BNP (last 3 results) No results for input(s): PROBNP in the last 8760 hours. HbA1C: No results for input(s): HGBA1C in the last 72 hours. CBG: No results for input(s): GLUCAP in the last 168 hours. Lipid Profile: No results for input(s): CHOL, HDL, LDLCALC, TRIG, CHOLHDL, LDLDIRECT in the last 72 hours. Thyroid Function Tests: No results for input(s): TSH, T4TOTAL, FREET4, T3FREE, THYROIDAB in the last 72 hours. Anemia Panel: No results for input(s): VITAMINB12, FOLATE, FERRITIN, TIBC, IRON, RETICCTPCT in the last 72 hours. Urine analysis:    Component Value Date/Time   COLORURINE YELLOW 06/14/2018 0606   APPEARANCEUR CLEAR 06/14/2018 0606   LABSPEC 1.020 06/14/2018 0606   PHURINE 5.0 06/14/2018 0606   GLUCOSEU NEGATIVE 06/14/2018 0606   HGBUR SMALL (A) 06/14/2018 0606   BILIRUBINUR NEGATIVE 06/14/2018 0606   BILIRUBINUR negative 06/10/2012 1631   KETONESUR NEGATIVE 06/14/2018 0606   PROTEINUR NEGATIVE 06/14/2018 0606   UROBILINOGEN negative 06/10/2012 1631   NITRITE NEGATIVE 06/14/2018 0606   LEUKOCYTESUR NEGATIVE 06/14/2018 0606    Radiological Exams on Admission: Dg Chest 2 View  Result Date: 06/14/2018 CLINICAL DATA:  Syncopal episode EXAM: CHEST - 2 VIEW COMPARISON:  04/11/2018 chest radiograph. FINDINGS: Stable cardiomediastinal silhouette with borderline mild cardiomegaly. No pneumothorax.  No pleural effusion. Lungs appear clear, with no acute consolidative airspace disease and no pulmonary edema. Visualized osseous structures appear intact. IMPRESSION: Borderline mild cardiomegaly. No pulmonary edema. No active pulmonary disease. Electronically Signed   By: Ilona Sorrel M.D.   On: 06/14/2018 05:22   Ct Head Wo Contrast  Result Date: 06/14/2018 CLINICAL DATA:  Syncopal episode with fall at home. EXAM: CT HEAD WITHOUT CONTRAST CT CERVICAL SPINE WITHOUT CONTRAST  TECHNIQUE: Multidetector CT imaging of the head and cervical spine was performed following the standard protocol without intravenous contrast. Multiplanar CT image reconstructions of the cervical spine were also generated. COMPARISON:  04/12/2018 head CT. 04/11/2018 head and cervical spine CT. FINDINGS: CT HEAD FINDINGS Brain: No evidence of parenchymal hemorrhage or extra-axial fluid collection. No mass lesion, mass effect, or midline shift. No CT evidence of acute infarction. Nonspecific mild subcortical and periventricular white matter hypodensity, most in keeping with chronic small vessel ischemic change. Cerebral volume is age appropriate. No ventriculomegaly. Vascular: No acute abnormality. Skull: No evidence of calvarial fracture. Sinuses/Orbits: The visualized paranasal sinuses are essentially clear. Other:  The mastoid air cells are unopacified. CT CERVICAL SPINE FINDINGS Alignment: Straightening of the cervical spine. No facet subluxation. Dens is well positioned between the lateral masses of C1. Stable 3 mm anterolisthesis at C3-4. Skull base and vertebrae: No acute fracture. No primary bone lesion or focal pathologic process. Soft tissues and spinal canal: No prevertebral edema. No visible canal hematoma. Disc levels: Severe multilevel degenerative disc disease throughout the cervical spine, unchanged. Advanced bilateral facet arthropathy. Moderate degenerative foraminal stenosis bilaterally at C5-6 and on the right at C6-7.  Upper chest: No acute abnormality. Other: Visualized mastoid air cells appear clear. No discrete thyroid nodules. No pathologically enlarged cervical nodes. IMPRESSION: 1. No evidence of acute intracranial abnormality. No evidence of calvarial fracture. 2. Mild chronic small vessel ischemic changes in the cerebral white matter. 3. No cervical spine fracture or facet subluxation. 4. Severe degenerative changes in the cervical spine as detailed. Electronically Signed   By: Ilona Sorrel M.D.   On: 06/14/2018 05:33   Ct Cervical Spine Wo Contrast  Result Date: 06/14/2018 CLINICAL DATA:  Syncopal episode with fall at home. EXAM: CT HEAD WITHOUT CONTRAST CT CERVICAL SPINE WITHOUT CONTRAST TECHNIQUE: Multidetector CT imaging of the head and cervical spine was performed following the standard protocol without intravenous contrast. Multiplanar CT image reconstructions of the cervical spine were also generated. COMPARISON:  04/12/2018 head CT. 04/11/2018 head and cervical spine CT. FINDINGS: CT HEAD FINDINGS Brain: No evidence of parenchymal hemorrhage or extra-axial fluid collection. No mass lesion, mass effect, or midline shift. No CT evidence of acute infarction. Nonspecific mild subcortical and periventricular white matter hypodensity, most in keeping with chronic small vessel ischemic change. Cerebral volume is age appropriate. No ventriculomegaly. Vascular: No acute abnormality. Skull: No evidence of calvarial fracture. Sinuses/Orbits: The visualized paranasal sinuses are essentially clear. Other:  The mastoid air cells are unopacified. CT CERVICAL SPINE FINDINGS Alignment: Straightening of the cervical spine. No facet subluxation. Dens is well positioned between the lateral masses of C1. Stable 3 mm anterolisthesis at C3-4. Skull base and vertebrae: No acute fracture. No primary bone lesion or focal pathologic process. Soft tissues and spinal canal: No prevertebral edema. No visible canal hematoma. Disc levels:  Severe multilevel degenerative disc disease throughout the cervical spine, unchanged. Advanced bilateral facet arthropathy. Moderate degenerative foraminal stenosis bilaterally at C5-6 and on the right at C6-7. Upper chest: No acute abnormality. Other: Visualized mastoid air cells appear clear. No discrete thyroid nodules. No pathologically enlarged cervical nodes. IMPRESSION: 1. No evidence of acute intracranial abnormality. No evidence of calvarial fracture. 2. Mild chronic small vessel ischemic changes in the cerebral white matter. 3. No cervical spine fracture or facet subluxation. 4. Severe degenerative changes in the cervical spine as detailed. Electronically Signed   By: Ilona Sorrel M.D.   On: 06/14/2018 05:33   Dg Hip  Unilat With Pelvis 2-3 Views Right  Result Date: 06/14/2018 CLINICAL DATA:  Syncopal episode with fall and right hip pain EXAM: DG HIP (WITH OR WITHOUT PELVIS) 2-3V RIGHT COMPARISON:  None. FINDINGS: No pelvic fracture or diastasis. Surgical hardware overlies the lower lumbar spine. No right hip fracture or dislocation. Minimal osteoarthritis in both hip joints. No suspicious focal osseous lesions. Small enthesophytes at the right greater trochanter. IMPRESSION: No fracture or malalignment. Electronically Signed   By: Ilona Sorrel M.D.   On: 06/14/2018 05:21    EKG: Independently reviewed.  A lot of artifact, he appears to be sinus tachycardia.  Will repeat     Assessment and Plan:   1.  Syncope/collapse: Patient has had 3 episodes so far and required hospitalization twice.  Unclear etiology.  Differentials include atypical seizures versus arrhythmia given history of SVT versus orthostasis versus vagal.  Check orthostatics, EEG and monitor on telemetry.  Will consult neurology/Cardiology.  May benefit from Holter monitor if no arrhythmias noted while hospitalized.  Repeat EKG.  Cycle cardiac enzymes.  Obtain echo.  2.  Diabetes mellitus: Diet-controlled  3.  Dementia with  memory issues: Resume home medications.  Calm and cooperative currently.  Husband is the primary caregiver and very involved in her care.  4.  Hypertension/hyperlipidemia: Resume home medications.  Check orthostatics and adjust if needed  5.  Von Willebrand's disease: No signs of bleeding.  Will avoid anticoagulants.  DVT prophylaxis: SCDs  Code Status: Husband stated he would want patient to be resuscitated in case of medical emergency.  Even though prior hospitalization patient's CODE STATUS was DNR he stated they intend to reinstate DNR only if patient has terminal illness.  Full code for now  Family Communication: Discussed with patient. Health care proxy would be  Consults called: Cardiology/neurology Admission status:  Patient admitted as observation as anticipated LOS less than 2 midnights    Guilford Shi MD [facility] Pager [phone]  If 7PM-7AM, please contact night-coverage [email or web address] Password [facility]  06/14/2018, 7:45 AM

## 2018-06-14 NOTE — Progress Notes (Signed)
Ordered pt breakfast tray

## 2018-06-14 NOTE — Consult Note (Signed)
Cardiology Consultation:   Patient ID: Stacy Davis MRN: [id number]; DOB: 08-13-1945  Admit date: 06/14/2018 Date of Consult: 06/14/2018  Primary Care Provider: Donita Brooks, MD Primary Cardiologist:   New , Salvatore Poe  Primary Electrophysiologist:  None    Patient Profile:   Stacy Davis is a 73 y.o. female with a hx of hypertension, hyperlipidemia, diet-controlled diabetes mellitus, anxiety, dementia who is being seen today for the evaluation of syncopal episode at the request of  Dr. Lajuana Ripple. Marland Kitchen  History of Present Illness:   Stacy Davis is a 72 year old female who is pleasantly demented.  We are asked to see her today for recurrent episodes of syncope. The patient is not able to give much history.  The history is obtained from chart review and in talking to the husband. The patient has fallen on 3 different occasions.  Is unclear whether these are due to orthostatic hypotension.   talking with her husband, it is clear that the patient's diet is not good at all.  She does not eat much meat.  She eats very little protein and very little salt.  Her appetite has been gradually decreasing over the past several months.    Past Medical History:  Diagnosis Date  . Anxiety   . Arthritis    ?OA vs Rheum (established with Dr. Gavin Potters pending blood work)  . Articular cartilage disorder of shoulder 04/2012   right  . Back pain    h/o DDD since 2010  . Dementia (HCC)   . Dental crowns present    also dental caps  . Diabetes mellitus    NIDDM  . Fecal incontinence   . GERD (gastroesophageal reflux disease)   . Headache(784.0)    occasional  . History of skin cancer   . Hyperlipidemia    no current med.  . Hypertension   . Hyperthyroidism   . Hypothyroid   . Impingement syndrome of right shoulder 04/2012  . Memory impairment    dementia- worsenign 12/2016 with medication non compliance and delusions.    . Right rotator cuff tear 05/15/2012  . Rotator cuff rupture  04/2012   right  . Von Willebrand disease (HCC)     Past Surgical History:  Procedure Laterality Date  . APPENDECTOMY    . BREAST SURGERY     hematoma evacuation due to trauma  . CARDIAC CATHETERIZATION  03/06/2001  . CESAREAN SECTION     x 2  . COLONOSCOPY  01/20/2006 per patient  . FRACTURE SURGERY    . KNEE ARTHROSCOPY Right 12/01/2014   Procedure: ARTHROSCOPY KNEE,partial medial menisectomy and plica excision;  Surgeon: Kennedy Bucker, MD;  Location: [facility];  Service: Orthopedics;  Laterality: Right;  . LUMBAR LAMINECTOMY/DECOMPRESSION MICRODISCECTOMY  03/01/2009   right L4-5; arthrodesis L4-5  . ORIF WRIST FRACTURE     left  . SHOULDER ARTHROSCOPY WITH ROTATOR CUFF REPAIR AND SUBACROMIAL DECOMPRESSION  05/15/2012   Procedure: SHOULDER ARTHROSCOPY WITH ROTATOR CUFF REPAIR AND SUBACROMIAL DECOMPRESSION;  Surgeon: Eulas Post, MD;  Location: [facility];  Service: Orthopedics;  Laterality: Right;  RIGHT ARTHROSCOPY SHOULDER DEBRIDEMENT LIMITED, ARTHROSCOPY SHOULDER DECOMPRESSION SUBACROMIAL PARTIAL ACROMIOPLASTY WITH CORACOACROMIAL RELEASE, ARTHROSCOPY SHOULDER WITH ROTATOR CUFF REPAIR  . TUBAL LIGATION       Home Medications:  Prior to Admission medications   Medication Sig Start Date End Date Taking? Authorizing Provider  levothyroxine (SYNTHROID, LEVOTHROID) 88 MCG tablet TAKE 1 TABLET BY MOUTH ONCE A DAY Patient taking differently: Take 88 mcg  by mouth daily.  04/14/18  Yes Donita Brooks, MD  nystatin (NYSTATIN) powder Apply topically daily as needed (under belly rash).   Yes [provider]  nystatin cream (MYCOSTATIN) Apply 1 application topically daily as needed (under belly rash).   Yes [provider]  omeprazole (PRILOSEC) 40 MG capsule TAKE 1 CAPSULE BY MOUTH ONCE DAILY Patient taking differently: Take 40 mg by mouth daily after supper.  09/08/17  Yes Donita Brooks, MD  OVER THE COUNTER MEDICATION Take 2 tablets by mouth 2 (two)  times daily as needed (pain). "Back and Body" over the counter   Yes [provider]  sertraline (ZOLOFT) 50 MG tablet TAKE 1 TABLET BY MOUTH ONCE A DAY Patient taking differently: Take 50 mg by mouth daily after supper.  03/19/18  Yes Donita Brooks, MD  clotrimazole (CLOTRIMAZOLE GRX) 1 % cream Apply 1 application topically 2 (two) times daily. Patient not taking: Reported on 06/14/2018 04/06/18   Beverley Fiedler, MD    Inpatient Medications: Scheduled Meds: . enoxaparin (LOVENOX) injection  40 mg Subcutaneous Q24H  . levothyroxine  88 mcg Oral Daily  . pantoprazole  40 mg Oral Daily  . sertraline  50 mg Oral QPC supper   Continuous Infusions:  PRN Meds: acetaminophen, bisacodyl, HYDROcodone-acetaminophen, nystatin, ondansetron **OR** ondansetron (ZOFRAN) IV, polyethylene glycol  Allergies:    Allergies  Allergen Reactions  . Tramadol Other (See Comments)    CHANGES IN MEMORY    Social History:   Social History   Socioeconomic History  . Marital status: Married    Spouse name: Stacy Davis  . Number of children: 2  . Years of education: 54  . Highest education level: Not on file  Occupational History    Comment: retired  Engineer, production  . Financial resource strain: Not on file  . Food insecurity:    Worry: Not on file    Inability: Not on file  . Transportation needs:    Medical: Not on file    Non-medical: Not on file  Tobacco Use  . Smoking status: Never Smoker  . Smokeless tobacco: Never Used  Substance and Sexual Activity  . Alcohol use: No    Alcohol/week: 0.0 standard drinks  . Drug use: No  . Sexual activity: Not Currently    Birth control/protection: Surgical  Lifestyle  . Physical activity:    Days per week: Not on file    Minutes per session: Not on file  . Stress: Not on file  Relationships  . Social connections:    Talks on phone: Not on file    Gets together: Not on file    Attends religious service: Not on file    Active member of club  or organization: Not on file    Attends meetings of clubs or organizations: Not on file    Relationship status: Not on file  . Intimate partner violence:    Fear of current or ex partner: Not on file    Emotionally abused: Not on file    Physically abused: Not on file    Forced sexual activity: Not on file  Other Topics Concern  . Not on file  Social History Narrative   Patient lives at home with her husband Stacy Davis).   Patient is retired .   Education high school.   Right handed.   Caffeine sweet tea two cups.       Family History:    Family History  Problem Relation Age of  Onset  . Dementia Mother   . Arthritis Mother   . Cancer Father        unknown primary  . Heart disease Father   . Thyroid disease Sister   . Cancer Sister        brain tumor  . Dementia Sister   . COPD Brother   . Cancer Brother        bone cancer  . Colon cancer Paternal Grandfather   . Breast cancer Neg Hx      ROS:  Please see the history of present illness.   All other ROS reviewed and negative.     Physical Exam/Data:   Vitals:   06/14/18 0715 06/14/18 0905 06/14/18 0955 06/14/18 1251  BP: 119/66 114/68 119/69 103/63  Pulse: 88 90 76 78  Resp: (!) 21 16 18 20   Temp:   98.4 F (36.9 C) 98.2 F (36.8 C)  TempSrc:   Oral Oral  SpO2: 97% 98% 98% 94%  Weight:      Height:        Intake/Output Summary (Last 24 hours) at 06/14/2018 1301 Last data filed at 06/14/2018 0606 Gross per 24 hour  Intake -  Output 250 ml  Net -250 ml   Last 3 Weights 06/14/2018 04/11/2018 04/06/2018  Weight (lbs) 158 lb 11.7 oz 156 lb 8.4 oz 158 lb  Weight (kg) 72 kg 71 kg 71.668 kg     Body mass index is 26.41 kg/m.  General: Pleasantly demented, elderly female, lying in bed.  Did not respond to many of my questions but did say hello HEENT: normal Lymph: no adenopathy Neck: no JVD Endocrine:  No thryomegaly Vascular: No carotid bruits; FA pulses 2+ bilaterally without bruits  Cardiac: Regular  rate S1-S2. Lungs:  clear to auscultation bilaterally, no wheezing, rhonchi or rales  Abd: soft, nontender, no hepatomegaly  Ext: no edema Musculoskeletal:  No deformities, BUE and BLE strength normal and equal Skin: warm and dry  Neuro:  CNs 2-12 intact, no focal abnormalities noted Psych:  Normal affect   EKG:  The EKG was personally reviewed and demonstrates: Normal sinus rhythm Telemetry:  Telemetry was personally reviewed and demonstrates: Sinus rhythm, no significant pauses  Relevant CV Studies:   Laboratory Data:  Chemistry Recent Labs  Lab 06/14/18 0442  NA 138  K 4.0  CL 107  GLUCOSE 115*  BUN 17  CREATININE 1.10*    No results for input(s): PROT, ALBUMIN, AST, ALT, ALKPHOS, BILITOT in the last 168 hours. Hematology Recent Labs  Lab 06/14/18 0427 06/14/18 0442  WBC 8.6  --   RBC 4.94  --   HGB 13.0 13.3  HCT 42.2 39.0  MCV 85.4  --   MCH 26.3  --   MCHC 30.8  --   RDW 14.5  --   PLT 221  --    Cardiac EnzymesNo results for input(s): TROPONINI in the last 168 hours.  Recent Labs  Lab 06/14/18 0439  TROPIPOC 0.01    BNPNo results for input(s): BNP, PROBNP in the last 168 hours.  DDimer No results for input(s): DDIMER in the last 168 hours.  Radiology/Studies:  Dg Chest 2 View  Result Date: 06/14/2018 CLINICAL DATA:  Syncopal episode EXAM: CHEST - 2 VIEW COMPARISON:  04/11/2018 chest radiograph. FINDINGS: Stable cardiomediastinal silhouette with borderline mild cardiomegaly. No pneumothorax. No pleural effusion. Lungs appear clear, with no acute consolidative airspace disease and no pulmonary edema. Visualized osseous structures appear intact. IMPRESSION: Borderline mild  cardiomegaly. No pulmonary edema. No active pulmonary disease. Electronically Signed   By: Delbert Phenix M.D.   On: 06/14/2018 05:22   Ct Head Wo Contrast  Result Date: 06/14/2018 CLINICAL DATA:  Syncopal episode with fall at home. EXAM: CT HEAD WITHOUT CONTRAST CT CERVICAL SPINE  WITHOUT CONTRAST TECHNIQUE: Multidetector CT imaging of the head and cervical spine was performed following the standard protocol without intravenous contrast. Multiplanar CT image reconstructions of the cervical spine were also generated. COMPARISON:  04/12/2018 head CT. 04/11/2018 head and cervical spine CT. FINDINGS: CT HEAD FINDINGS Brain: No evidence of parenchymal hemorrhage or extra-axial fluid collection. No mass lesion, mass effect, or midline shift. No CT evidence of acute infarction. Nonspecific mild subcortical and periventricular white matter hypodensity, most in keeping with chronic small vessel ischemic change. Cerebral volume is age appropriate. No ventriculomegaly. Vascular: No acute abnormality. Skull: No evidence of calvarial fracture. Sinuses/Orbits: The visualized paranasal sinuses are essentially clear. Other:  The mastoid air cells are unopacified. CT CERVICAL SPINE FINDINGS Alignment: Straightening of the cervical spine. No facet subluxation. Dens is well positioned between the lateral masses of C1. Stable 3 mm anterolisthesis at C3-4. Skull base and vertebrae: No acute fracture. No primary bone lesion or focal pathologic process. Soft tissues and spinal canal: No prevertebral edema. No visible canal hematoma. Disc levels: Severe multilevel degenerative disc disease throughout the cervical spine, unchanged. Advanced bilateral facet arthropathy. Moderate degenerative foraminal stenosis bilaterally at C5-6 and on the right at C6-7. Upper chest: No acute abnormality. Other: Visualized mastoid air cells appear clear. No discrete thyroid nodules. No pathologically enlarged cervical nodes. IMPRESSION: 1. No evidence of acute intracranial abnormality. No evidence of calvarial fracture. 2. Mild chronic small vessel ischemic changes in the cerebral white matter. 3. No cervical spine fracture or facet subluxation. 4. Severe degenerative changes in the cervical spine as detailed. Electronically Signed    By: Delbert Phenix M.D.   On: 06/14/2018 05:33   Ct Cervical Spine Wo Contrast  Result Date: 06/14/2018 CLINICAL DATA:  Syncopal episode with fall at home. EXAM: CT HEAD WITHOUT CONTRAST CT CERVICAL SPINE WITHOUT CONTRAST TECHNIQUE: Multidetector CT imaging of the head and cervical spine was performed following the standard protocol without intravenous contrast. Multiplanar CT image reconstructions of the cervical spine were also generated. COMPARISON:  04/12/2018 head CT. 04/11/2018 head and cervical spine CT. FINDINGS: CT HEAD FINDINGS Brain: No evidence of parenchymal hemorrhage or extra-axial fluid collection. No mass lesion, mass effect, or midline shift. No CT evidence of acute infarction. Nonspecific mild subcortical and periventricular white matter hypodensity, most in keeping with chronic small vessel ischemic change. Cerebral volume is age appropriate. No ventriculomegaly. Vascular: No acute abnormality. Skull: No evidence of calvarial fracture. Sinuses/Orbits: The visualized paranasal sinuses are essentially clear. Other:  The mastoid air cells are unopacified. CT CERVICAL SPINE FINDINGS Alignment: Straightening of the cervical spine. No facet subluxation. Dens is well positioned between the lateral masses of C1. Stable 3 mm anterolisthesis at C3-4. Skull base and vertebrae: No acute fracture. No primary bone lesion or focal pathologic process. Soft tissues and spinal canal: No prevertebral edema. No visible canal hematoma. Disc levels: Severe multilevel degenerative disc disease throughout the cervical spine, unchanged. Advanced bilateral facet arthropathy. Moderate degenerative foraminal stenosis bilaterally at C5-6 and on the right at C6-7. Upper chest: No acute abnormality. Other: Visualized mastoid air cells appear clear. No discrete thyroid nodules. No pathologically enlarged cervical nodes. IMPRESSION: 1. No evidence of acute intracranial abnormality. No evidence  of calvarial fracture. 2. Mild  chronic small vessel ischemic changes in the cerebral white matter. 3. No cervical spine fracture or facet subluxation. 4. Severe degenerative changes in the cervical spine as detailed. Electronically Signed   By: Delbert Phenix M.D.   On: 06/14/2018 05:33   Dg Hip Unilat With Pelvis 2-3 Views Right  Result Date: 06/14/2018 CLINICAL DATA:  Syncopal episode with fall and right hip pain EXAM: DG HIP (WITH OR WITHOUT PELVIS) 2-3V RIGHT COMPARISON:  None. FINDINGS: No pelvic fracture or diastasis. Surgical hardware overlies the lower lumbar spine. No right hip fracture or dislocation. Minimal osteoarthritis in both hip joints. No suspicious focal osseous lesions. Small enthesophytes at the right greater trochanter. IMPRESSION: No fracture or malalignment. Electronically Signed   By: Delbert Phenix M.D.   On: 06/14/2018 05:21    Assessment and Plan:   1. Recurrent syncope: The patient has generally declining health.  She has fairly significant dementia.  Her appetite has been gradually deteriorating.  Suspect that her episodes of recurrent syncope or due to orthostatic hypotension.  They typically occur when she is standing up.  Her blood pressure is low at baseline.     I think that our best option is to try to get her to eat better.  She needs to eat more protein and more salt.    If she is not able to increase her dietary intake of protein and salt, we could try Florinef and also perhaps midodrine.  I reviewed her telemetry.  She has not had any pauses.  I do not think that outpatient event monitoring would be helpful.    [facility] will sign off.   Medication Recommendations:    Add Florinef 0.1 mg a day.  Titrate up as needed / as tolerated  Other recommendations (labs, testing, etc):   Follow up as an outpatient:   Follow up with her primary MD .  She may see [place] as needed.   For questions or updates, please contact [facility] Please consult [email or web address] for contact info under      Signed, Kristeen Miss, MD  06/14/2018 1:01 PM

## 2018-06-14 NOTE — ED Provider Notes (Signed)
[place] EMERGENCY DEPARTMENT Provider Note   CSN: [id number] Arrival date & time: 06/14/18  0405     History   Chief Complaint Chief Complaint  Patient presents with  . Syncope/Fall    HPI Stacy Davis is a 73 y.o. female.  The history is provided by the EMS personnel. The history is limited by the condition of the patient.  Loss of Consciousness  Episode history:  Single Most recent episode:  Today Timing:  Constant Progression:  Resolved Chronicity:  Recurrent Context: normal activity   Witnessed: yes   Relieved by:  Nothing Worsened by:  Nothing Ineffective treatments:  None tried Associated symptoms: no diaphoresis, no fever, no focal weakness, no palpitations and no shortness of breath   Risk factors: no congenital heart disease   EMS reports that fire and rescue used BVM until they arrived as the patient was both unresponsive and had agonal respirations.  No f/c/r.    Past Medical History:  Diagnosis Date  . Anxiety   . Arthritis    ?OA vs Rheum (established with Dr. Jefm Bryant pending blood work)  . Articular cartilage disorder of shoulder 04/2012   right  . Back pain    h/o DDD since 2010  . Dementia ([place])   . Dental crowns present    also dental caps  . Diabetes mellitus    NIDDM  . Fecal incontinence   . GERD (gastroesophageal reflux disease)   . Headache(784.0)    occasional  . History of skin cancer   . Hyperlipidemia    no current med.  . Hypertension   . Hyperthyroidism   . Hypothyroid   . Impingement syndrome of right shoulder 04/2012  . Memory impairment    dementia- worsenign 12/2016 with medication non compliance and delusions.    . Right rotator cuff tear 05/15/2012  . Rotator cuff rupture 04/2012   right  . Von Willebrand disease [facility])     Patient Active Problem List   Diagnosis Date Noted  . Subarachnoid hemorrhage ([place])   . Syncope 04/11/2018  . Incontinence of feces 01/08/2018  . Depression  10/08/2017  . Memory impairment   . Advance care planning 10/06/2014  . Joint pain 04/15/2013  . Advance directive on file 08/12/2012  . Confusion 06/10/2012  . Right rotator cuff tear 05/15/2012  . Shoulder pain 05/03/2012  . Insomnia 08/13/2011  . 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 HTN (hypertension) 07/18/2011  . HLD (hyperlipidemia) 07/18/2011  . Memory loss 07/18/2011  . Hypothyroid 07/18/2011  . SVT (supraventricular tachycardia) ([place]) 07/18/2011  . Von Willebrand disease ([place]) 07/18/2011  . GERD (gastroesophageal reflux disease) 07/18/2011  . Back pain 07/18/2011  . Fatty liver 07/18/2011    Past Surgical History:  Procedure Laterality Date  . APPENDECTOMY    . BREAST SURGERY     hematoma evacuation due to trauma  . CARDIAC CATHETERIZATION  03/06/2001  . CESAREAN SECTION     x 2  . COLONOSCOPY  01/20/2006 per patient  . FRACTURE SURGERY    . KNEE ARTHROSCOPY Right 12/01/2014   Procedure: ARTHROSCOPY KNEE,partial medial menisectomy and plica excision;  Surgeon: Hessie Knows, MD;  Location: [facility] ORS;  Service: Orthopedics;  Laterality: Right;  . LUMBAR LAMINECTOMY/DECOMPRESSION MICRODISCECTOMY  03/01/2009   right L4-5; arthrodesis L4-5  . ORIF WRIST FRACTURE     left  . SHOULDER ARTHROSCOPY WITH ROTATOR CUFF REPAIR AND SUBACROMIAL DECOMPRESSION  05/15/2012   Procedure: SHOULDER ARTHROSCOPY  WITH ROTATOR CUFF REPAIR AND SUBACROMIAL DECOMPRESSION;  Surgeon: Johnny Bridge, MD;  Location: [facility];  Service: Orthopedics;  Laterality: Right;  RIGHT ARTHROSCOPY SHOULDER DEBRIDEMENT LIMITED, ARTHROSCOPY SHOULDER DECOMPRESSION SUBACROMIAL PARTIAL ACROMIOPLASTY WITH CORACOACROMIAL RELEASE, ARTHROSCOPY SHOULDER WITH ROTATOR CUFF REPAIR  . TUBAL LIGATION       OB History   No obstetric history on file.      Home Medications    Prior to Admission medications   Medication Sig Start Date End Date Taking? Authorizing  Provider  Choline Dihydrogen Citrate (CHOLINE CITRATE PO) Take 1 tablet by mouth daily after supper.     [provider]  clotrimazole (CLOTRIMAZOLE GRX) 1 % cream Apply 1 application topically 2 (two) times daily. 04/06/18   Pyrtle, Lajuan Lines, MD  levothyroxine (SYNTHROID, LEVOTHROID) 88 MCG tablet TAKE 1 TABLET BY MOUTH ONCE A DAY 04/14/18   Susy Frizzle, MD  nystatin (NYSTATIN) powder Apply topically daily as needed (under belly rash).    [provider]  nystatin cream (MYCOSTATIN) Apply 1 application topically daily as needed (under belly rash).    [provider]  omeprazole (PRILOSEC) 40 MG capsule TAKE 1 CAPSULE BY MOUTH ONCE DAILY Patient taking differently: Take 40 mg by mouth daily after supper.  09/08/17   Susy Frizzle, MD  OVER THE COUNTER MEDICATION Take 2 tablets by mouth 2 (two) times daily as needed (pain). "Back and Body" over the counter    [provider]  sertraline (ZOLOFT) 50 MG tablet TAKE 1 TABLET BY MOUTH ONCE A DAY Patient taking differently: Take 50 mg by mouth daily after supper.  03/19/18   Susy Frizzle, MD  TURMERIC PO Take 1 capsule by mouth daily.    [provider]    Family History Family History  Problem Relation Age of Onset  . Dementia Mother   . Arthritis Mother   . Cancer Father        unknown primary  . Heart disease Father   . Thyroid disease Sister   . Cancer Sister        brain tumor  . Dementia Sister   . COPD Brother   . Cancer Brother        bone cancer  . Colon cancer Paternal Grandfather   . Breast cancer Neg Hx     Social History Social History   Tobacco Use  . Smoking status: Never Smoker  . Smokeless tobacco: Never Used  Substance Use Topics  . Alcohol use: No    Alcohol/week: 0.0 standard drinks  . Drug use: No     Allergies   Tramadol   Review of Systems Review of Systems  Unable to perform ROS: Dementia  Constitutional: Negative for diaphoresis and fever.    Respiratory: Negative for shortness of breath.        More than 15 minutes of BVM by fire and rescue for unresponsive with agonal respirations  Cardiovascular: Positive for syncope. Negative for palpitations.  Gastrointestinal: Negative for abdominal pain.  Neurological: Negative for focal weakness.     Physical Exam Updated Vital Signs BP 109/79   Pulse 93   Temp 98.9 F (37.2 C) (Rectal)   Resp 18   Ht 5\' 5"  (1.651 m)   Wt 72 kg   SpO2 97%   BMI 26.41 kg/m   Physical Exam Vitals signs and nursing note reviewed.  Constitutional:      Appearance: She is not ill-appearing.  HENT:  Head: Normocephalic and atraumatic.     Right Ear: Tympanic membrane normal.     Left Ear: Tympanic membrane normal.     Nose: Nose normal.     Mouth/Throat:     Mouth: Mucous membranes are moist.  Eyes:     Extraocular Movements: Extraocular movements intact.     Pupils: Pupils are equal, round, and reactive to light.  Neck:     Musculoskeletal: Normal range of motion and neck supple. No neck rigidity.  Cardiovascular:     Rate and Rhythm: Normal rate and regular rhythm.     Pulses: Normal pulses.     Heart sounds: Normal heart sounds.  Pulmonary:     Effort: Pulmonary effort is normal. No respiratory distress.     Breath sounds: Normal breath sounds.  Abdominal:     General: Abdomen is flat. Bowel sounds are normal.     Palpations: There is no mass.     Tenderness: There is no abdominal tenderness. There is no guarding or rebound.     Hernia: No hernia is present.  Musculoskeletal: Normal range of motion.     Right lower leg: No edema.     Left lower leg: No edema.  Skin:    General: Skin is warm and dry.     Capillary Refill: Capillary refill takes less than 2 seconds.  Neurological:     General: No focal deficit present.     Mental Status: She is alert.     Cranial Nerves: No cranial nerve deficit.     Coordination: Coordination normal.     Deep Tendon Reflexes: Reflexes  normal.      ED Treatments / Results  Labs (all labs ordered are listed, but only abnormal results are displayed) Results for orders placed or performed during the hospital encounter of 06/14/18  CBC with Differential/Platelet  Result Value Ref Range   WBC 8.6 4.0 - 10.5 K/uL   RBC 4.94 3.87 - 5.11 MIL/uL   Hemoglobin 13.0 12.0 - 15.0 g/dL   HCT 42.2 36.0 - 46.0 %   MCV 85.4 80.0 - 100.0 fL   MCH 26.3 26.0 - 34.0 pg   MCHC 30.8 30.0 - 36.0 g/dL   RDW 14.5 11.5 - 15.5 %   Platelets 221 150 - 400 K/uL   nRBC 0.0 0.0 - 0.2 %   Neutrophils Relative % 68 %   Neutro Abs 6.0 1.7 - 7.7 K/uL   Lymphocytes Relative 23 %   Lymphs Abs 1.9 0.7 - 4.0 K/uL   Monocytes Relative 6 %   Monocytes Absolute 0.5 0.1 - 1.0 K/uL   Eosinophils Relative 1 %   Eosinophils Absolute 0.1 0.0 - 0.5 K/uL   Basophils Relative 1 %   Basophils Absolute 0.0 0.0 - 0.1 K/uL   Immature Granulocytes 1 %   Abs Immature Granulocytes 0.06 0.00 - 0.07 K/uL  I-Stat Chem 8, ED  Result Value Ref Range   Sodium 138 135 - 145 mmol/L   Potassium 4.0 3.5 - 5.1 mmol/L   Chloride 107 98 - 111 mmol/L   BUN 17 8 - 23 mg/dL   Creatinine, Ser 1.10 (H) 0.44 - 1.00 mg/dL   Glucose, Bld 115 (H) 70 - 99 mg/dL   Calcium, Ion 1.05 (L) 1.15 - 1.40 mmol/L   TCO2 23 22 - 32 mmol/L   Hemoglobin 13.3 12.0 - 15.0 g/dL   HCT 39.0 36.0 - 46.0 %  I-stat troponin, ED  Result Value Ref Range  Troponin i, poc 0.01 0.00 - 0.08 ng/mL   Comment 3           Dg Chest 2 View  Result Date: 06/14/2018 CLINICAL DATA:  Syncopal episode EXAM: CHEST - 2 VIEW COMPARISON:  04/11/2018 chest radiograph. FINDINGS: Stable cardiomediastinal silhouette with borderline mild cardiomegaly. No pneumothorax. No pleural effusion. Lungs appear clear, with no acute consolidative airspace disease and no pulmonary edema. Visualized osseous structures appear intact. IMPRESSION: Borderline mild cardiomegaly. No pulmonary edema. No active pulmonary disease.  Electronically Signed   By: Ilona Sorrel M.D.   On: 06/14/2018 05:22   Ct Head Wo Contrast  Result Date: 06/14/2018 CLINICAL DATA:  Syncopal episode with fall at home. EXAM: CT HEAD WITHOUT CONTRAST CT CERVICAL SPINE WITHOUT CONTRAST TECHNIQUE: Multidetector CT imaging of the head and cervical spine was performed following the standard protocol without intravenous contrast. Multiplanar CT image reconstructions of the cervical spine were also generated. COMPARISON:  04/12/2018 head CT. 04/11/2018 head and cervical spine CT. FINDINGS: CT HEAD FINDINGS Brain: No evidence of parenchymal hemorrhage or extra-axial fluid collection. No mass lesion, mass effect, or midline shift. No CT evidence of acute infarction. Nonspecific mild subcortical and periventricular white matter hypodensity, most in keeping with chronic small vessel ischemic change. Cerebral volume is age appropriate. No ventriculomegaly. Vascular: No acute abnormality. Skull: No evidence of calvarial fracture. Sinuses/Orbits: The visualized paranasal sinuses are essentially clear. Other:  The mastoid air cells are unopacified. CT CERVICAL SPINE FINDINGS Alignment: Straightening of the cervical spine. No facet subluxation. Dens is well positioned between the lateral masses of C1. Stable 3 mm anterolisthesis at C3-4. Skull base and vertebrae: No acute fracture. No primary bone lesion or focal pathologic process. Soft tissues and spinal canal: No prevertebral edema. No visible canal hematoma. Disc levels: Severe multilevel degenerative disc disease throughout the cervical spine, unchanged. Advanced bilateral facet arthropathy. Moderate degenerative foraminal stenosis bilaterally at C5-6 and on the right at C6-7. Upper chest: No acute abnormality. Other: Visualized mastoid air cells appear clear. No discrete thyroid nodules. No pathologically enlarged cervical nodes. IMPRESSION: 1. No evidence of acute intracranial abnormality. No evidence of calvarial  fracture. 2. Mild chronic small vessel ischemic changes in the cerebral white matter. 3. No cervical spine fracture or facet subluxation. 4. Severe degenerative changes in the cervical spine as detailed. Electronically Signed   By: Ilona Sorrel M.D.   On: 06/14/2018 05:33   Ct Cervical Spine Wo Contrast  Result Date: 06/14/2018 CLINICAL DATA:  Syncopal episode with fall at home. EXAM: CT HEAD WITHOUT CONTRAST CT CERVICAL SPINE WITHOUT CONTRAST TECHNIQUE: Multidetector CT imaging of the head and cervical spine was performed following the standard protocol without intravenous contrast. Multiplanar CT image reconstructions of the cervical spine were also generated. COMPARISON:  04/12/2018 head CT. 04/11/2018 head and cervical spine CT. FINDINGS: CT HEAD FINDINGS Brain: No evidence of parenchymal hemorrhage or extra-axial fluid collection. No mass lesion, mass effect, or midline shift. No CT evidence of acute infarction. Nonspecific mild subcortical and periventricular white matter hypodensity, most in keeping with chronic small vessel ischemic change. Cerebral volume is age appropriate. No ventriculomegaly. Vascular: No acute abnormality. Skull: No evidence of calvarial fracture. Sinuses/Orbits: The visualized paranasal sinuses are essentially clear. Other:  The mastoid air cells are unopacified. CT CERVICAL SPINE FINDINGS Alignment: Straightening of the cervical spine. No facet subluxation. Dens is well positioned between the lateral masses of C1. Stable 3 mm anterolisthesis at C3-4. Skull base and vertebrae: No acute fracture. No  primary bone lesion or focal pathologic process. Soft tissues and spinal canal: No prevertebral edema. No visible canal hematoma. Disc levels: Severe multilevel degenerative disc disease throughout the cervical spine, unchanged. Advanced bilateral facet arthropathy. Moderate degenerative foraminal stenosis bilaterally at C5-6 and on the right at C6-7. Upper chest: No acute abnormality.  Other: Visualized mastoid air cells appear clear. No discrete thyroid nodules. No pathologically enlarged cervical nodes. IMPRESSION: 1. No evidence of acute intracranial abnormality. No evidence of calvarial fracture. 2. Mild chronic small vessel ischemic changes in the cerebral white matter. 3. No cervical spine fracture or facet subluxation. 4. Severe degenerative changes in the cervical spine as detailed. Electronically Signed   By: Ilona Sorrel M.D.   On: 06/14/2018 05:33   Dg Hip Unilat With Pelvis 2-3 Views Right  Result Date: 06/14/2018 CLINICAL DATA:  Syncopal episode with fall and right hip pain EXAM: DG HIP (WITH OR WITHOUT PELVIS) 2-3V RIGHT COMPARISON:  None. FINDINGS: No pelvic fracture or diastasis. Surgical hardware overlies the lower lumbar spine. No right hip fracture or dislocation. Minimal osteoarthritis in both hip joints. No suspicious focal osseous lesions. Small enthesophytes at the right greater trochanter. IMPRESSION: No fracture or malalignment. Electronically Signed   By: Ilona Sorrel M.D.   On: 06/14/2018 05:21    EKG EKG Interpretation  Date/Time:  Sunday June 14 2018 04:14:43 EST Ventricular Rate:  96 PR Interval:    QRS Duration: 121 QT Interval:  379 QTC Calculation: 479 R Axis:   -18 Text Interpretation:  Normal sinus rhythm Nonspecific intraventricular conduction delay Baseline wander in lead(s) V2 Confirmed by Randal Buba, Alysah Carton ([id number]) on 06/14/2018 4:17:29 AM   Radiology Dg Chest 2 View  Result Date: 06/14/2018 CLINICAL DATA:  Syncopal episode EXAM: CHEST - 2 VIEW COMPARISON:  04/11/2018 chest radiograph. FINDINGS: Stable cardiomediastinal silhouette with borderline mild cardiomegaly. No pneumothorax. No pleural effusion. Lungs appear clear, with no acute consolidative airspace disease and no pulmonary edema. Visualized osseous structures appear intact. IMPRESSION: Borderline mild cardiomegaly. No pulmonary edema. No active pulmonary disease. Electronically  Signed   By: Ilona Sorrel M.D.   On: 06/14/2018 05:22   Ct Head Wo Contrast  Result Date: 06/14/2018 CLINICAL DATA:  Syncopal episode with fall at home. EXAM: CT HEAD WITHOUT CONTRAST CT CERVICAL SPINE WITHOUT CONTRAST TECHNIQUE: Multidetector CT imaging of the head and cervical spine was performed following the standard protocol without intravenous contrast. Multiplanar CT image reconstructions of the cervical spine were also generated. COMPARISON:  04/12/2018 head CT. 04/11/2018 head and cervical spine CT. FINDINGS: CT HEAD FINDINGS Brain: No evidence of parenchymal hemorrhage or extra-axial fluid collection. No mass lesion, mass effect, or midline shift. No CT evidence of acute infarction. Nonspecific mild subcortical and periventricular white matter hypodensity, most in keeping with chronic small vessel ischemic change. Cerebral volume is age appropriate. No ventriculomegaly. Vascular: No acute abnormality. Skull: No evidence of calvarial fracture. Sinuses/Orbits: The visualized paranasal sinuses are essentially clear. Other:  The mastoid air cells are unopacified. CT CERVICAL SPINE FINDINGS Alignment: Straightening of the cervical spine. No facet subluxation. Dens is well positioned between the lateral masses of C1. Stable 3 mm anterolisthesis at C3-4. Skull base and vertebrae: No acute fracture. No primary bone lesion or focal pathologic process. Soft tissues and spinal canal: No prevertebral edema. No visible canal hematoma. Disc levels: Severe multilevel degenerative disc disease throughout the cervical spine, unchanged. Advanced bilateral facet arthropathy. Moderate degenerative foraminal stenosis bilaterally at C5-6 and on the right at C6-7. Upper  chest: No acute abnormality. Other: Visualized mastoid air cells appear clear. No discrete thyroid nodules. No pathologically enlarged cervical nodes. IMPRESSION: 1. No evidence of acute intracranial abnormality. No evidence of calvarial fracture. 2. Mild  chronic small vessel ischemic changes in the cerebral white matter. 3. No cervical spine fracture or facet subluxation. 4. Severe degenerative changes in the cervical spine as detailed. Electronically Signed   By: Ilona Sorrel M.D.   On: 06/14/2018 05:33   Ct Cervical Spine Wo Contrast  Result Date: 06/14/2018 CLINICAL DATA:  Syncopal episode with fall at home. EXAM: CT HEAD WITHOUT CONTRAST CT CERVICAL SPINE WITHOUT CONTRAST TECHNIQUE: Multidetector CT imaging of the head and cervical spine was performed following the standard protocol without intravenous contrast. Multiplanar CT image reconstructions of the cervical spine were also generated. COMPARISON:  04/12/2018 head CT. 04/11/2018 head and cervical spine CT. FINDINGS: CT HEAD FINDINGS Brain: No evidence of parenchymal hemorrhage or extra-axial fluid collection. No mass lesion, mass effect, or midline shift. No CT evidence of acute infarction. Nonspecific mild subcortical and periventricular white matter hypodensity, most in keeping with chronic small vessel ischemic change. Cerebral volume is age appropriate. No ventriculomegaly. Vascular: No acute abnormality. Skull: No evidence of calvarial fracture. Sinuses/Orbits: The visualized paranasal sinuses are essentially clear. Other:  The mastoid air cells are unopacified. CT CERVICAL SPINE FINDINGS Alignment: Straightening of the cervical spine. No facet subluxation. Dens is well positioned between the lateral masses of C1. Stable 3 mm anterolisthesis at C3-4. Skull base and vertebrae: No acute fracture. No primary bone lesion or focal pathologic process. Soft tissues and spinal canal: No prevertebral edema. No visible canal hematoma. Disc levels: Severe multilevel degenerative disc disease throughout the cervical spine, unchanged. Advanced bilateral facet arthropathy. Moderate degenerative foraminal stenosis bilaterally at C5-6 and on the right at C6-7. Upper chest: No acute abnormality. Other: Visualized  mastoid air cells appear clear. No discrete thyroid nodules. No pathologically enlarged cervical nodes. IMPRESSION: 1. No evidence of acute intracranial abnormality. No evidence of calvarial fracture. 2. Mild chronic small vessel ischemic changes in the cerebral white matter. 3. No cervical spine fracture or facet subluxation. 4. Severe degenerative changes in the cervical spine as detailed. Electronically Signed   By: Ilona Sorrel M.D.   On: 06/14/2018 05:33   Dg Hip Unilat With Pelvis 2-3 Views Right  Result Date: 06/14/2018 CLINICAL DATA:  Syncopal episode with fall and right hip pain EXAM: DG HIP (WITH OR WITHOUT PELVIS) 2-3V RIGHT COMPARISON:  None. FINDINGS: No pelvic fracture or diastasis. Surgical hardware overlies the lower lumbar spine. No right hip fracture or dislocation. Minimal osteoarthritis in both hip joints. No suspicious focal osseous lesions. Small enthesophytes at the right greater trochanter. IMPRESSION: No fracture or malalignment. Electronically Signed   By: Ilona Sorrel M.D.   On: 06/14/2018 05:21    Procedures Procedures (including critical care time)  Medications Ordered in ED Medications - No data to display   Have spoken to husband and patient and himself have DNR on file with her PMD but he does not have it with him.    Final Clinical Impressions(s) / ED Diagnoses   Final diagnoses:  Fall, initial encounter  Syncope and collapse   Will admit for observations.   Will admit for syncope given patient was agonal and    Rodrecus Belsky, MD 06/14/18 [phone]

## 2018-06-14 NOTE — Progress Notes (Signed)
Paged MD regarding pt complains of abdominal pain and regarding pt code status Awaiting call back

## 2018-06-14 NOTE — ED Triage Notes (Signed)
Patient arrived with EMS from home spouse reported syncopal episode and fell this morning at home , agonal respirations at home - bagged initially and recovered , O2 sat= 96% room air by EMS , CBG= 160.

## 2018-06-14 NOTE — Progress Notes (Signed)
  Echocardiogram 2D Echocardiogram has been performed with [facility].  Stacy Davis 06/14/2018, 3:45 PM

## 2018-06-14 NOTE — Progress Notes (Signed)
Pt transferred to MRI

## 2018-06-14 NOTE — Procedures (Addendum)
History: 73 yo F with dementia, being evaluated for possible seizures.  Sedation: None  Technique: This is a 21 channel routine scalp EEG performed at the bedside with bipolar and monopolar montages arranged in accordance to the international 10/20 system of electrode placement. One channel was dedicated to EKG recording.    Background: The background consists of intermixed alpha and beta activities. There is a well defined posterior dominant rhythm of 7-8 Hz that attenuates with eye opening. There is irregular diffuse theta and delta activity. In addition, there are occasional sharp wave discharges at F8 > T8 > F4.  Photic stimulation: Physiologic driving is Not performed  EEG Abnormalities: 1) Focal right fronto-temporal sharp waves 2) Generalized irregular slow activity  Clinical Interpretation: This EEG is consistent with an area of potential epileptogenicity in the right frontotemporal region. There was no seizure recorded on this study.   Roland Rack, MD [facility] [phone]  If 7pm- 7am, please page neurology on call as listed in [place].

## 2018-06-14 NOTE — Consult Note (Addendum)
NEURO HOSPITALIST CONSULT NOTE   Requestig physician: Dr. Earnest Conroy  Reason for Consult: Possible seizures  History obtained from:  Husband and Chart     HPI:                                                                                                                                          Stacy Davis is an 73 y.o. female who presented to the ED today for a third spell of collapse with LOC. Her first spell happened in early 2019 - she was helping her husband set the bed with she abruptly fell face-forward and was unresponsive for 10 minutes, during which time she was "jerking all over", eyes rolled back in her head, drooling out of both sides of her mouth, with whole-body stiffening. On arrival of EMS, she was given O2 per husband and rapidly regained consciousness, but was confused for about a 10 minutes afterward. She did not bite her tongue or lose continence. There were no premonitory symptoms.   Her second spell occurred in August with essentially identical semiology, except that she fell backwards and hit her head on the kitchen floor. O2 by EMS again revived the patient and she was admitted to [facility] with work up revealing bilateral traumatic subarachnoid hemorrhages.   Her third spell happened at about 2 AM last night. Per Dr. Lenoria Chime note: "patient was found on the edge of the bed and the sound of her fall woke husband up.  Patient however did not have any jerking movements this time but appeared to have agonal and coarse respirations.  Firemen apparently arrived before EMS and bagged her.  Upon EMS arrival within 10 minutes patient was awake and responding.CBG on EMS eval 160, o2 sat 96% RA.  Patient currently denies any headache or joint pains.  She does not recollect falling or losing consciousness.  She believes she was trying to get up to use the bathroom.  Of note, patient does have a history of SVT which husband describes as "panic attack" for which she  sees Dr. Quay Burow.  She denies any complaints of chest pain or palpitations or dizzy spells preceding the episodes."   CT head in the ED today showed no acute abnormality.   She has a diagnosis of Alzhemer's dementia. Approximately 3 of her close relatives have been diagnosed with Alzheimers. Per husband, she has visual hallucinations at night during which she sees members of her family and speaks to them. She has intermittent upper extremity tremor, usually when performing fine motor skills such as using a spoon to eat. She has "good days and bad days" regarding her cognition, per husband. She does not thrash around in her sleep. She does not have a shuffling gait, but her ambulation is slow, per  husband.   Both she and her husband endorse recurring headaches, almost every day. The headaches used to occur less frequently. She is also often anxious and has been depressed.   CT head and cervical spine from today:  1. No evidence of acute intracranial abnormality. No evidence of calvarial fracture. 2. Mild chronic small vessel ischemic changes in the cerebral white matter. 3. No cervical spine fracture or facet subluxation. 4. Severe degenerative changes in the cervical spine as detailed.  Prior MRI from 04/12/18:  Small right parietal scalp hematoma and small volume traumatic subarachnoid hemorrhage overlying the superior right frontal and anterior left frontal lobes.   Past Medical History:  Diagnosis Date  . Anxiety   . Arthritis    ?OA vs Rheum (established with Dr. Jefm Bryant pending blood work)  . Articular cartilage disorder of shoulder 04/2012   right  . Back pain    h/o DDD since 2010  . Dementia ([place])   . Dental crowns present    also dental caps  . Diabetes mellitus    NIDDM  . Fecal incontinence   . GERD (gastroesophageal reflux disease)   . Headache(784.0)    occasional  . History of skin cancer   . Hyperlipidemia    no current med.  . Hypertension   .  Hyperthyroidism   . Hypothyroid   . Impingement syndrome of right shoulder 04/2012  . Memory impairment    dementia- worsenign 12/2016 with medication non compliance and delusions.    . Right rotator cuff tear 05/15/2012  . Rotator cuff rupture 04/2012   right  . Von Willebrand disease ([place])     Past Surgical History:  Procedure Laterality Date  . APPENDECTOMY    . BREAST SURGERY     hematoma evacuation due to trauma  . CARDIAC CATHETERIZATION  03/06/2001  . CESAREAN SECTION     x 2  . COLONOSCOPY  01/20/2006 per patient  . FRACTURE SURGERY    . KNEE ARTHROSCOPY Right 12/01/2014   Procedure: ARTHROSCOPY KNEE,partial medial menisectomy and plica excision;  Surgeon: Hessie Knows, MD;  Location: [facility];  Service: Orthopedics;  Laterality: Right;  . LUMBAR LAMINECTOMY/DECOMPRESSION MICRODISCECTOMY  03/01/2009   right L4-5; arthrodesis L4-5  . ORIF WRIST FRACTURE     left  . SHOULDER ARTHROSCOPY WITH ROTATOR CUFF REPAIR AND SUBACROMIAL DECOMPRESSION  05/15/2012   Procedure: SHOULDER ARTHROSCOPY WITH ROTATOR CUFF REPAIR AND SUBACROMIAL DECOMPRESSION;  Surgeon: Johnny Bridge, MD;  Location: [facility];  Service: Orthopedics;  Laterality: Right;  RIGHT ARTHROSCOPY SHOULDER DEBRIDEMENT LIMITED, ARTHROSCOPY SHOULDER DECOMPRESSION SUBACROMIAL PARTIAL ACROMIOPLASTY WITH CORACOACROMIAL RELEASE, ARTHROSCOPY SHOULDER WITH ROTATOR CUFF REPAIR  . TUBAL LIGATION      Family History  Problem Relation Age of Onset  . Dementia Mother   . Arthritis Mother   . Cancer Father        unknown primary  . Heart disease Father   . Thyroid disease Sister   . Cancer Sister        brain tumor  . Dementia Sister   . COPD Brother   . Cancer Brother        bone cancer  . Colon cancer Paternal Grandfather   . Breast cancer Neg Hx              Social History:  reports that she has never smoked. She has never used smokeless tobacco. She reports that she does not drink alcohol or use  drugs.  Allergies  Allergen  Reactions  . Tramadol Other (See Comments)    CHANGES IN MEMORY    MEDICATIONS:                                                                                                                     Current Meds  Medication Sig  . levothyroxine (SYNTHROID, LEVOTHROID) 88 MCG tablet TAKE 1 TABLET BY MOUTH ONCE A DAY (Patient taking differently: Take 88 mcg by mouth daily. )  . nystatin (NYSTATIN) powder Apply topically daily as needed (under belly rash).  . nystatin cream (MYCOSTATIN) Apply 1 application topically daily as needed (under belly rash).  Marland Kitchen omeprazole (PRILOSEC) 40 MG capsule TAKE 1 CAPSULE BY MOUTH ONCE DAILY (Patient taking differently: Take 40 mg by mouth daily after supper. )  . OVER THE COUNTER MEDICATION Take 2 tablets by mouth 2 (two) times daily as needed (pain). "Back and Body" over the counter  . sertraline (ZOLOFT) 50 MG tablet TAKE 1 TABLET BY MOUTH ONCE A DAY (Patient taking differently: Take 50 mg by mouth daily after supper. )     ROS:                                                                                                                                       As per HPI. 10 point review of systems is otherwise negative.    Blood pressure 119/69, pulse 76, temperature 98.4 F (36.9 C), temperature source Oral, resp. rate 18, height 5\' 5"  (1.651 m), weight 72 kg, SpO2 98 %.   General Examination:                                                                                                       Physical Exam  HEENT-  [place]/AT    Lungs- Respirations unlabored Extremities- No edema   Neurological Examination Mental Status: Awake and alert. [place]. Will follow all motor commands. Will answer basic questions, but for the most part relies on husband to provide history.  Knows that she has been having spells. Does not know the year, day or month. Appears confused when asked for the state and city. Speech is sparse but fluent. Able  to name examiner's thumb after initially saying "one". Correctly identifies an inkpen. Unable to identify a nametag. Not able to name pinky or index finger. Often appears confused. Non-agitated.   Cranial Nerves: II: Visual fields with decreased tendency to blink to threat on the left. Consistently blinks to threat on the right. PERRL.   III,IV, VI: No ptosis. EOMI. No nystagmus.  V,VII: Face symmetric. FT equal bilaterally.  VIII: hearing intact to voice IX,X: No hoarseness or hypophonia XI: Shoulder shrug symmetric XII: midline tongue extension Motor: Right : Upper extremity   5/5    Left:     Upper extremity   5/5  Lower extremity   5/5     Lower extremity   5/5 Normal tone throughout; no rigidity noted. No atrophy noted No tremor noted.  Sensory: Light touch intact in all 4 extremities without extinction.  Deep Tendon Reflexes: 2+ and symmetric biceps, brachioradialis and achilles bilaterally. 3+ patellae bilaterally.  Plantars: Toes are upgoing at baseline, downgoing with plantar stimulation.  Right: downgoing   Left: downgoing Cerebellar: No ataxia or intention tremor with FNF bilaterally Gait: Deferred   Lab Results: Basic Metabolic Panel: Recent Labs  Lab 06/14/18 0442  NA 138  K 4.0  CL 107  GLUCOSE 115*  BUN 17  CREATININE 1.10*    CBC: Recent Labs  Lab 06/14/18 0427 06/14/18 0442  WBC 8.6  --   NEUTROABS 6.0  --   HGB 13.0 13.3  HCT 42.2 39.0  MCV 85.4  --   PLT 221  --     Cardiac Enzymes: No results for input(s): CKTOTAL, CKMB, CKMBINDEX, TROPONINI in the last 168 hours.  Lipid Panel: No results for input(s): CHOL, TRIG, HDL, CHOLHDL, VLDL, LDLCALC in the last 168 hours.  Imaging: Dg Chest 2 View  Result Date: 06/14/2018 CLINICAL DATA:  Syncopal episode EXAM: CHEST - 2 VIEW COMPARISON:  04/11/2018 chest radiograph. FINDINGS: Stable cardiomediastinal silhouette with borderline mild cardiomegaly. No pneumothorax. No pleural effusion. Lungs appear  clear, with no acute consolidative airspace disease and no pulmonary edema. Visualized osseous structures appear intact. IMPRESSION: Borderline mild cardiomegaly. No pulmonary edema. No active pulmonary disease. Electronically Signed   By: Ilona Sorrel M.D.   On: 06/14/2018 05:22   Ct Head Wo Contrast  Result Date: 06/14/2018 CLINICAL DATA:  Syncopal episode with fall at home. EXAM: CT HEAD WITHOUT CONTRAST CT CERVICAL SPINE WITHOUT CONTRAST TECHNIQUE: Multidetector CT imaging of the head and cervical spine was performed following the standard protocol without intravenous contrast. Multiplanar CT image reconstructions of the cervical spine were also generated. COMPARISON:  04/12/2018 head CT. 04/11/2018 head and cervical spine CT. FINDINGS: CT HEAD FINDINGS Brain: No evidence of parenchymal hemorrhage or extra-axial fluid collection. No mass lesion, mass effect, or midline shift. No CT evidence of acute infarction. Nonspecific mild subcortical and periventricular white matter hypodensity, most in keeping with chronic small vessel ischemic change. Cerebral volume is age appropriate. No ventriculomegaly. Vascular: No acute abnormality. Skull: No evidence of calvarial fracture. Sinuses/Orbits: The visualized paranasal sinuses are essentially clear. Other:  The mastoid air cells are unopacified. CT CERVICAL SPINE FINDINGS Alignment: Straightening of the cervical spine. No facet subluxation. Dens is well positioned between the lateral masses of C1. Stable 3 mm anterolisthesis at C3-4. Skull base and vertebrae: No acute fracture.  No primary bone lesion or focal pathologic process. Soft tissues and spinal canal: No prevertebral edema. No visible canal hematoma. Disc levels: Severe multilevel degenerative disc disease throughout the cervical spine, unchanged. Advanced bilateral facet arthropathy. Moderate degenerative foraminal stenosis bilaterally at C5-6 and on the right at C6-7. Upper chest: No acute abnormality.  Other: Visualized mastoid air cells appear clear. No discrete thyroid nodules. No pathologically enlarged cervical nodes. IMPRESSION: 1. No evidence of acute intracranial abnormality. No evidence of calvarial fracture. 2. Mild chronic small vessel ischemic changes in the cerebral white matter. 3. No cervical spine fracture or facet subluxation. 4. Severe degenerative changes in the cervical spine as detailed. Electronically Signed   By: Ilona Sorrel M.D.   On: 06/14/2018 05:33   Ct Cervical Spine Wo Contrast  Result Date: 06/14/2018 CLINICAL DATA:  Syncopal episode with fall at home. EXAM: CT HEAD WITHOUT CONTRAST CT CERVICAL SPINE WITHOUT CONTRAST TECHNIQUE: Multidetector CT imaging of the head and cervical spine was performed following the standard protocol without intravenous contrast. Multiplanar CT image reconstructions of the cervical spine were also generated. COMPARISON:  04/12/2018 head CT. 04/11/2018 head and cervical spine CT. FINDINGS: CT HEAD FINDINGS Brain: No evidence of parenchymal hemorrhage or extra-axial fluid collection. No mass lesion, mass effect, or midline shift. No CT evidence of acute infarction. Nonspecific mild subcortical and periventricular white matter hypodensity, most in keeping with chronic small vessel ischemic change. Cerebral volume is age appropriate. No ventriculomegaly. Vascular: No acute abnormality. Skull: No evidence of calvarial fracture. Sinuses/Orbits: The visualized paranasal sinuses are essentially clear. Other:  The mastoid air cells are unopacified. CT CERVICAL SPINE FINDINGS Alignment: Straightening of the cervical spine. No facet subluxation. Dens is well positioned between the lateral masses of C1. Stable 3 mm anterolisthesis at C3-4. Skull base and vertebrae: No acute fracture. No primary bone lesion or focal pathologic process. Soft tissues and spinal canal: No prevertebral edema. No visible canal hematoma. Disc levels: Severe multilevel degenerative disc  disease throughout the cervical spine, unchanged. Advanced bilateral facet arthropathy. Moderate degenerative foraminal stenosis bilaterally at C5-6 and on the right at C6-7. Upper chest: No acute abnormality. Other: Visualized mastoid air cells appear clear. No discrete thyroid nodules. No pathologically enlarged cervical nodes. IMPRESSION: 1. No evidence of acute intracranial abnormality. No evidence of calvarial fracture. 2. Mild chronic small vessel ischemic changes in the cerebral white matter. 3. No cervical spine fracture or facet subluxation. 4. Severe degenerative changes in the cervical spine as detailed. Electronically Signed   By: Ilona Sorrel M.D.   On: 06/14/2018 05:33   Dg Hip Unilat With Pelvis 2-3 Views Right  Result Date: 06/14/2018 CLINICAL DATA:  Syncopal episode with fall and right hip pain EXAM: DG HIP (WITH OR WITHOUT PELVIS) 2-3V RIGHT COMPARISON:  None. FINDINGS: No pelvic fracture or diastasis. Surgical hardware overlies the lower lumbar spine. No right hip fracture or dislocation. Minimal osteoarthritis in both hip joints. No suspicious focal osseous lesions. Small enthesophytes at the right greater trochanter. IMPRESSION: No fracture or malalignment. Electronically Signed   By: Ilona Sorrel M.D.   On: 06/14/2018 05:21    Assessment: 73 year old female presenting for assessment of spells of LOC x 3, with associated seizure-like activity on 2 out of 3 occasions, but not with the most recent event.  1. DDx includes cardiogenic syncope followed by syncopal convulsion, and epileptic seizures.  2. Although she has a diagnosis of Alzheimer dementia, Lewy body dementia is an alternate differential diagnostic consideration  based on her history of formed visual hallucinations of people, hand tremor and fluctuating cognition, as based on husband's description.  3. CT head shows no evidence of acute intracranial abnormality. Mild chronic small vessel ischemic changes in the cerebral white  matter are noted. 4. Exam is nonfocal except for cognitive changes consistent with her dementia and hyperreflexic patellar reflexes.  Recommendations: 1. EEG (ordered) 2. MRI brain to assess for possible epileptogenic focus 3. Cardiac work up for possible cardiogenic syncope 4. Orthostatics 5. May need a loop recorder at discharge 6. May need to follow up with Neurology for a Digitrace (portable EEG) if cardiac work up is unrevealing.  7. Start Aricept 5 mg po qhs 8. Avoid neuroleptic medications    Electronically signed: Dr. Kerney Elbe 06/14/2018, 11:04 AM

## 2018-06-14 NOTE — Progress Notes (Signed)
2D echo at bedside

## 2018-06-14 NOTE — Progress Notes (Signed)
EEG completed; results pending.    

## 2018-06-15 DIAGNOSIS — E1151 Type 2 diabetes mellitus with diabetic peripheral angiopathy without gangrene: Secondary | ICD-10-CM | POA: Diagnosis present

## 2018-06-15 DIAGNOSIS — G309 Alzheimer's disease, unspecified: Secondary | ICD-10-CM | POA: Diagnosis present

## 2018-06-15 DIAGNOSIS — E039 Hypothyroidism, unspecified: Secondary | ICD-10-CM | POA: Diagnosis present

## 2018-06-15 DIAGNOSIS — Z8261 Family history of arthritis: Secondary | ICD-10-CM | POA: Diagnosis not present

## 2018-06-15 DIAGNOSIS — Z888 Allergy status to other drugs, medicaments and biological substances status: Secondary | ICD-10-CM | POA: Diagnosis not present

## 2018-06-15 DIAGNOSIS — E0590 Thyrotoxicosis, unspecified without thyrotoxic crisis or storm: Secondary | ICD-10-CM | POA: Diagnosis present

## 2018-06-15 DIAGNOSIS — K219 Gastro-esophageal reflux disease without esophagitis: Secondary | ICD-10-CM | POA: Diagnosis present

## 2018-06-15 DIAGNOSIS — Z8349 Family history of other endocrine, nutritional and metabolic diseases: Secondary | ICD-10-CM | POA: Diagnosis not present

## 2018-06-15 DIAGNOSIS — Z66 Do not resuscitate: Secondary | ICD-10-CM | POA: Diagnosis present

## 2018-06-15 DIAGNOSIS — D680 Von Willebrand's disease: Secondary | ICD-10-CM | POA: Diagnosis present

## 2018-06-15 DIAGNOSIS — Y92009 Unspecified place in unspecified non-institutional (private) residence as the place of occurrence of the external cause: Secondary | ICD-10-CM | POA: Diagnosis not present

## 2018-06-15 DIAGNOSIS — R269 Unspecified abnormalities of gait and mobility: Secondary | ICD-10-CM | POA: Diagnosis present

## 2018-06-15 DIAGNOSIS — E785 Hyperlipidemia, unspecified: Secondary | ICD-10-CM | POA: Diagnosis present

## 2018-06-15 DIAGNOSIS — R296 Repeated falls: Secondary | ICD-10-CM | POA: Diagnosis present

## 2018-06-15 DIAGNOSIS — Z85828 Personal history of other malignant neoplasm of skin: Secondary | ICD-10-CM | POA: Diagnosis not present

## 2018-06-15 DIAGNOSIS — R413 Other amnesia: Secondary | ICD-10-CM | POA: Diagnosis not present

## 2018-06-15 DIAGNOSIS — F0280 Dementia in other diseases classified elsewhere without behavioral disturbance: Secondary | ICD-10-CM | POA: Diagnosis present

## 2018-06-15 DIAGNOSIS — R159 Full incontinence of feces: Secondary | ICD-10-CM | POA: Diagnosis present

## 2018-06-15 DIAGNOSIS — G40909 Epilepsy, unspecified, not intractable, without status epilepticus: Secondary | ICD-10-CM | POA: Diagnosis present

## 2018-06-15 DIAGNOSIS — R55 Syncope and collapse: Secondary | ICD-10-CM | POA: Diagnosis present

## 2018-06-15 DIAGNOSIS — Z8249 Family history of ischemic heart disease and other diseases of the circulatory system: Secondary | ICD-10-CM | POA: Diagnosis not present

## 2018-06-15 DIAGNOSIS — W19XXXA Unspecified fall, initial encounter: Secondary | ICD-10-CM | POA: Diagnosis present

## 2018-06-15 DIAGNOSIS — F419 Anxiety disorder, unspecified: Secondary | ICD-10-CM | POA: Diagnosis present

## 2018-06-15 DIAGNOSIS — Z79899 Other long term (current) drug therapy: Secondary | ICD-10-CM | POA: Diagnosis not present

## 2018-06-15 DIAGNOSIS — F329 Major depressive disorder, single episode, unspecified: Secondary | ICD-10-CM | POA: Diagnosis present

## 2018-06-15 DIAGNOSIS — I10 Essential (primary) hypertension: Secondary | ICD-10-CM | POA: Diagnosis present

## 2018-06-15 DIAGNOSIS — Z79890 Hormone replacement therapy (postmenopausal): Secondary | ICD-10-CM | POA: Diagnosis not present

## 2018-06-15 DIAGNOSIS — R569 Unspecified convulsions: Secondary | ICD-10-CM | POA: Diagnosis not present

## 2018-06-15 LAB — BASIC METABOLIC PANEL
Anion gap: 13 (ref 5–15)
BUN: 13 mg/dL (ref 8–23)
CO2: 23 mmol/L (ref 22–32)
Calcium: 9.2 mg/dL (ref 8.9–10.3)
Chloride: 102 mmol/L (ref 98–111)
Creatinine, Ser: 1.09 mg/dL — ABNORMAL HIGH (ref 0.44–1.00)
GFR calc Af Amer: 59 mL/min — ABNORMAL LOW (ref >60)
GFR calc non Af Amer: 51 mL/min — ABNORMAL LOW (ref >60)
Glucose, Bld: 101 mg/dL — ABNORMAL HIGH (ref 70–99)
Potassium: 3.4 mmol/L — ABNORMAL LOW (ref 3.5–5.1)
Sodium: 138 mmol/L (ref 135–145)

## 2018-06-15 MED ORDER — LEVETIRACETAM 500 MG PO TABS
500.0000 mg | ORAL_TABLET | Freq: Two times a day (BID) | ORAL | Status: DC
  Administered 2018-06-15 – 2018-06-16 (×2): 500 mg via ORAL
  Filled 2018-06-15 (×2): qty 1

## 2018-06-15 MED ORDER — LEVETIRACETAM IN NACL 1000 MG/100ML IV SOLN
1000.0000 mg | Freq: Once | INTRAVENOUS | Status: AC
  Administered 2018-06-15: 1000 mg via INTRAVENOUS
  Filled 2018-06-15: qty 100

## 2018-06-15 MED ORDER — ALPRAZOLAM 0.25 MG PO TABS
0.2500 mg | ORAL_TABLET | Freq: Once | ORAL | Status: AC
  Administered 2018-06-15: 0.25 mg via ORAL
  Filled 2018-06-15: qty 1

## 2018-06-15 NOTE — Evaluation (Signed)
Physical Therapy Evaluation Patient Details Name: Stacy Davis MRN: [id number] DOB: [birth year] Today's Date: 06/15/2018   History of Present Illness  AMENDA DUCLOS is a 73 y.o. female with history h/o hypertension, hyperlipidemia, Von Willebrand disease, diet-controlled diabetes mellitus type 2, GERD, hypothyroidism, anxiety, Alzheimer's dementia presented with recurrent syncopal/collapse episode.  Patient was admitted in August 2019 with similar presentation.  According to the husband patient has had 3 episodes so far where she suddenly loses consciousness and suffers a fall.  Pt placed on seizure medication as her EEG showed abnormality.  PMH: Alzheimers, HTN, DM type 2, Willebrand dz, GERD, hypothyroidism, anxiety, depression.    Clinical Impression  Pt admitted with above diagnosis. Pt currently with functional limitations due to the deficits listed below (see PT Problem List). Pt was very lethargic and was only able to stand and pivot to chair with mod assist due to difficulty keeping her eyes open.  Nurse wanted PT to try to see pt but pt too lethargic to do more than transfer to chair.  Husband states she sleeps a lot at home.  Husband also states that she had meds last night and he thinks that is why she is so sleepy.  He states he wants to take her home tomorrow and that he doesn't want [place] services.   Will follow acutely.  Pt will benefit from skilled PT to increase their independence and safety with mobility to allow discharge to the venue listed below.      Follow Up Recommendations No PT follow up;Supervision/Assistance - 24 hour(husband states they just finished a bout of PT/OT at home)    Equipment Recommendations  None recommended by PT    Recommendations for Other Services       Precautions / Restrictions Precautions Precautions: Fall Restrictions Weight Bearing Restrictions: No      Mobility  Bed Mobility Overal bed mobility: Needs Assistance Bed Mobility:  Rolling;Supine to Sit Rolling: Min assist   Supine to sit: Min assist     General bed mobility comments: Needed assist due to lethargy.  Pt had difficulty staying awake for evaluation.   Transfers Overall transfer level: Needs assistance Equipment used: 2 person hand held assist Transfers: Sit to/from [facility] Sit to Stand: Min assist;Mod assist Stand pivot transfers: Mod assist       General transfer comment: Pt with decr balance however possibly due to meds pt was given as she was very lethargic and needeed incr assist to take a few steps to chair.  Needed mod guidance.  Pt kept eyes closed a lot of the session.   Ambulation/Gait                Stairs            Wheelchair Mobility    Modified Rankin (Stroke Patients Only)       Balance Overall balance assessment: Needs assistance Sitting-balance support: No upper extremity supported;Feet supported Sitting balance-Leahy Scale: Poor Sitting balance - Comments: Pt lethargic and needed min guard assist due to lethargy Postural control: Posterior lean Standing balance support: Single extremity supported;During functional activity Standing balance-Leahy Scale: Poor Standing balance comment: required mod assist due to lethargy                             Pertinent Vitals/Pain Pain Assessment: No/denies pain    Home Living Family/patient expects to be discharged to:: Private residence Living Arrangements: Spouse/significant other Available  Help at Discharge: Family;Available 24 hours/day Type of Home: House Home Access: Stairs to enter Entrance Stairs-Rails: Right Entrance Stairs-Number of Steps: 5 Home Layout: One level Home Equipment: Walker - standard;Walker - 2 wheels;Shower seat      Prior Function Level of Independence: Needs assistance   Gait / Transfers Assistance Needed: used cane at times per pt and can walk without his assist  ADL's / Homemaking Assistance  Needed: S for basic ADLS.        Hand Dominance   Dominant Hand: Right    Extremity/Trunk Assessment   Upper Extremity Assessment Upper Extremity Assessment: Defer to OT evaluation    Lower Extremity Assessment Lower Extremity Assessment: Generalized weakness    Cervical / Trunk Assessment Cervical / Trunk Assessment: Normal  Communication   Communication: No difficulties  Cognition Arousal/Alertness: Lethargic;Suspect due to medications Behavior During Therapy: Flat affect Overall Cognitive Status: History of cognitive impairments - at baseline                                 General Comments: husband states pt got some med last night that made her sleepy currently.        General Comments General comments (skin integrity, edema, etc.): husband states pt usually sleeps midnight to noon.  He states she is probably fatigued from being up last night and they gave her a med that made her sleepy.      Exercises     Assessment/Plan    PT Assessment Patient needs continued PT services  PT Problem List Decreased activity tolerance;Decreased balance;Decreased mobility;Decreased coordination;Decreased knowledge of use of DME;Decreased safety awareness;Decreased knowledge of precautions;Cardiopulmonary status limiting activity       PT Treatment Interventions DME instruction;Gait training;Functional mobility training;Therapeutic activities;Therapeutic exercise;Balance training;Patient/family education;Stair training    PT Goals (Current goals can be found in the Care Plan section)  Acute Rehab PT Goals Patient Stated Goal: to go home PT Goal Formulation: With family Time For Goal Achievement: 06/29/18 Potential to Achieve Goals: Good    Frequency Min 3X/week   Barriers to discharge        Co-evaluation               AM-PAC PT "6 Clicks" Mobility  Outcome Measure Help needed turning from your back to your side while in a flat bed without using  bedrails?: A Little Help needed moving from lying on your back to sitting on the side of a flat bed without using bedrails?: A Lot Help needed moving to and from a bed to a chair (including a wheelchair)?: A Lot Help needed standing up from a chair using your arms (e.g., wheelchair or bedside chair)?: A Lot Help needed to walk in hospital room?: Total Help needed climbing 3-5 steps with a railing? : Total 6 Click Score: 11    End of Session Equipment Utilized During Treatment: Gait belt Activity Tolerance: Patient limited by fatigue;Patient limited by lethargy Patient left: in chair;with call bell/phone within reach;with chair alarm set;with family/visitor present Nurse Communication: Mobility status PT Visit Diagnosis: Unsteadiness on feet (R26.81);Muscle weakness (generalized) (M62.81)    Time: 3244-0102 PT Time Calculation (min) (ACUTE ONLY): 26 min   Charges:   PT Evaluation $PT Eval Moderate Complexity: 1 Mod PT Treatments $Neuromuscular Re-education: 8-22 mins        [place] Pager:  [phone]  Office:  [place]  06/15/2018, 3:07 PM

## 2018-06-15 NOTE — Progress Notes (Signed)
Subjective: MRI and EEG have been completed.   Objective: Current vital signs: BP 109/80 (BP Location: Right Arm)   Pulse 81   Temp 98 F (36.7 C) (Oral)   Resp 18   Ht 5\' 6"  (1.676 m)   Wt 68.4 kg Comment: scale c  SpO2 93%   BMI 24.36 kg/m  Vital signs in last 24 hours: Temp:  [97.6 F (36.4 C)-98.6 F (37 C)] 98 F (36.7 C) (01/20 0606) Pulse Rate:  [64-90] 81 (01/20 0613) Resp:  [16-21] 18 (01/20 0606) BP: (97-123)/(50-85) 109/80 (01/20 0613) SpO2:  [93 %-98 %] 93 % (01/20 0606) Weight:  [68.4 kg-68.6 kg] 68.4 kg (01/20 0500)  Intake/Output from previous day: 01/19 0701 - 01/20 0700 In: 360 [P.O.:360] Out: -  Intake/Output this shift: No intake/output data recorded. Nutritional status:  Diet Order            Diet regular Room service appropriate? Yes; Fluid consistency: Thin  Diet effective now             HEENT: [place]/AT Lungs: Respirations unlabored Ext: No edema  Neurologic Exam: Ment: Sleeping comfortably. Briefly arouses to stimulation. Detailed mentation exam deferred.  CN: Face symmetric.  Motor: Tone is normal in the sleep state without asymmetry. No jerking or twitching noted. Sensory: Withdraws BLE slightly to plantar stimulation and giggles briefly.  Reflexes: 2+ brachioradialis bilaterally.   Lab Results: Results for orders placed or performed during the hospital encounter of 06/14/18 (from the past 48 hour(s))  CBC with Differential/Platelet     Status: None   Collection Time: 06/14/18  4:27 AM  Result Value Ref Range   WBC 8.6 4.0 - 10.5 K/uL   RBC 4.94 3.87 - 5.11 MIL/uL   Hemoglobin 13.0 12.0 - 15.0 g/dL   HCT 42.2 36.0 - 46.0 %   MCV 85.4 80.0 - 100.0 fL   MCH 26.3 26.0 - 34.0 pg   MCHC 30.8 30.0 - 36.0 g/dL   RDW 14.5 11.5 - 15.5 %   Platelets 221 150 - 400 K/uL   nRBC 0.0 0.0 - 0.2 %   Neutrophils Relative % 68 %   Neutro Abs 6.0 1.7 - 7.7 K/uL   Lymphocytes Relative 23 %   Lymphs Abs 1.9 0.7 - 4.0 K/uL   Monocytes Relative 6 %    Monocytes Absolute 0.5 0.1 - 1.0 K/uL   Eosinophils Relative 1 %   Eosinophils Absolute 0.1 0.0 - 0.5 K/uL   Basophils Relative 1 %   Basophils Absolute 0.0 0.0 - 0.1 K/uL   Immature Granulocytes 1 %   Abs Immature Granulocytes 0.06 0.00 - 0.07 K/uL    Comment: Performed at [facility], [address], [place], [place] [address]  I-stat troponin, ED     Status: None   Collection Time: 06/14/18  4:39 AM  Result Value Ref Range   Troponin i, poc 0.01 0.00 - 0.08 ng/mL   Comment 3            Comment: Due to the release kinetics of cTnI, a negative result within the first hours of the onset of symptoms does not rule out myocardial infarction with certainty. If myocardial infarction is still suspected, repeat the test at appropriate intervals.   I-Stat Chem 8, ED     Status: Abnormal   Collection Time: 06/14/18  4:42 AM  Result Value Ref Range   Sodium 138 135 - 145 mmol/L   Potassium 4.0 3.5 - 5.1 mmol/L  Chloride 107 98 - 111 mmol/L   BUN 17 8 - 23 mg/dL   Creatinine, Ser 1.10 (H) 0.44 - 1.00 mg/dL   Glucose, Bld 115 (H) 70 - 99 mg/dL   Calcium, Ion 1.05 (L) 1.15 - 1.40 mmol/L   TCO2 23 22 - 32 mmol/L   Hemoglobin 13.3 12.0 - 15.0 g/dL   HCT 39.0 36.0 - 46.0 %  Urinalysis, Routine w reflex microscopic     Status: Abnormal   Collection Time: 06/14/18  6:06 AM  Result Value Ref Range   Color, Urine YELLOW YELLOW   APPearance CLEAR CLEAR   Specific Gravity, Urine 1.020 1.005 - 1.030   pH 5.0 5.0 - 8.0   Glucose, UA NEGATIVE NEGATIVE mg/dL   Hgb urine dipstick SMALL (A) NEGATIVE   Bilirubin Urine NEGATIVE NEGATIVE   Ketones, ur NEGATIVE NEGATIVE mg/dL   Protein, ur NEGATIVE NEGATIVE mg/dL   Nitrite NEGATIVE NEGATIVE   Leukocytes, UA NEGATIVE NEGATIVE   RBC / HPF 0-5 0 - 5 RBC/hpf   WBC, UA 0-5 0 - 5 WBC/hpf   Bacteria, UA NONE SEEN NONE SEEN   Mucus PRESENT    Hyaline Casts, UA PRESENT     Comment: Performed at [facility], [address],  [place], [place] [address]  I-Stat arterial blood gas, ED     Status: Abnormal   Collection Time: 06/14/18  8:01 AM  Result Value Ref Range   pH, Arterial 7.488 (H) 7.350 - 7.450   pCO2 arterial 30.5 (L) 32.0 - 48.0 mmHg   pO2, Arterial 72.0 (L) 83.0 - 108.0 mmHg   Bicarbonate 23.1 20.0 - 28.0 mmol/L   TCO2 24 22 - 32 mmol/L   O2 Saturation 96.0 %   Patient temperature 98.6 F    Collection site RADIAL, ALLEN'S TEST ACCEPTABLE    Drawn by RT    Sample type ARTERIAL   TSH     Status: None   Collection Time: 06/14/18 10:20 AM  Result Value Ref Range   TSH 4.032 0.350 - 4.500 uIU/mL    Comment: Performed by a 3rd Generation assay with a functional sensitivity of <=0.01 uIU/mL. Performed at [facility], [place] [address] [place], [place] [address]   Basic metabolic panel     Status: Abnormal   Collection Time: 06/15/18  5:22 AM  Result Value Ref Range   Sodium 138 135 - 145 mmol/L   Potassium 3.4 (L) 3.5 - 5.1 mmol/L   Chloride 102 98 - 111 mmol/L   CO2 23 22 - 32 mmol/L   Glucose, Bld 101 (H) 70 - 99 mg/dL   BUN 13 8 - 23 mg/dL   Creatinine, Ser 1.09 (H) 0.44 - 1.00 mg/dL   Calcium 9.2 8.9 - 10.3 mg/dL   GFR calc non Af Amer 51 (L) >60 mL/min   GFR calc Af Amer 59 (L) >60 mL/min   Anion gap 13 5 - 15    Comment: Performed at [facility] [address] [place], [place] [address]    No results found for this or any previous visit (from the past 240 hour(s)).  Lipid Panel No results for input(s): CHOL, TRIG, HDL, CHOLHDL, VLDL, LDLCALC in the last 72 hours.  Studies/Results: Dg Chest 2 View  Result Date: 06/14/2018 CLINICAL DATA:  Syncopal episode EXAM: CHEST - 2 VIEW COMPARISON:  04/11/2018 chest radiograph. FINDINGS: Stable cardiomediastinal silhouette with borderline mild cardiomegaly. No pneumothorax. No pleural effusion. Lungs appear clear,  with no acute consolidative airspace disease and no pulmonary edema. Visualized osseous structures appear intact. IMPRESSION:  Borderline mild cardiomegaly. No pulmonary edema. No active pulmonary disease. Electronically Signed   By: Ilona Sorrel M.D.   On: 06/14/2018 05:22   Ct Head Wo Davis  Result Date: 06/14/2018 CLINICAL DATA:  Syncopal episode with fall at home. EXAM: CT HEAD WITHOUT Davis CT CERVICAL SPINE WITHOUT Davis TECHNIQUE: Multidetector CT imaging of the head and cervical spine was performed following the standard protocol without intravenous Davis. Multiplanar CT image reconstructions of the cervical spine were also generated. COMPARISON:  04/12/2018 head CT. 04/11/2018 head and cervical spine CT. FINDINGS: CT HEAD FINDINGS Brain: No evidence of parenchymal hemorrhage or extra-axial fluid collection. No mass lesion, mass effect, or midline shift. No CT evidence of acute infarction. Nonspecific mild subcortical and periventricular white matter hypodensity, most in keeping with chronic small vessel ischemic change. Cerebral volume is age appropriate. No ventriculomegaly. Vascular: No acute abnormality. Skull: No evidence of calvarial fracture. Sinuses/Orbits: The visualized paranasal sinuses are essentially clear. Other:  The mastoid air cells are unopacified. CT CERVICAL SPINE FINDINGS Alignment: Straightening of the cervical spine. No facet subluxation. Dens is well positioned between the lateral masses of C1. Stable 3 mm anterolisthesis at C3-4. Skull base and vertebrae: No acute fracture. No primary bone lesion or focal pathologic process. Soft tissues and spinal canal: No prevertebral edema. No visible canal hematoma. Disc levels: Severe multilevel degenerative disc disease throughout the cervical spine, unchanged. Advanced bilateral facet arthropathy. Moderate degenerative foraminal stenosis bilaterally at C5-6 and on the right at C6-7. Upper chest: No acute abnormality. Other: Visualized mastoid air cells appear clear. No discrete thyroid nodules. No pathologically enlarged cervical nodes. IMPRESSION: 1.  No evidence of acute intracranial abnormality. No evidence of calvarial fracture. 2. Mild chronic small vessel ischemic changes in the cerebral white matter. 3. No cervical spine fracture or facet subluxation. 4. Severe degenerative changes in the cervical spine as detailed. Electronically Signed   By: Ilona Sorrel M.D.   On: 06/14/2018 05:33   Ct Cervical Spine Wo Davis  Result Date: 06/14/2018 CLINICAL DATA:  Syncopal episode with fall at home. EXAM: CT HEAD WITHOUT Davis CT CERVICAL SPINE WITHOUT Davis TECHNIQUE: Multidetector CT imaging of the head and cervical spine was performed following the standard protocol without intravenous Davis. Multiplanar CT image reconstructions of the cervical spine were also generated. COMPARISON:  04/12/2018 head CT. 04/11/2018 head and cervical spine CT. FINDINGS: CT HEAD FINDINGS Brain: No evidence of parenchymal hemorrhage or extra-axial fluid collection. No mass lesion, mass effect, or midline shift. No CT evidence of acute infarction. Nonspecific mild subcortical and periventricular white matter hypodensity, most in keeping with chronic small vessel ischemic change. Cerebral volume is age appropriate. No ventriculomegaly. Vascular: No acute abnormality. Skull: No evidence of calvarial fracture. Sinuses/Orbits: The visualized paranasal sinuses are essentially clear. Other:  The mastoid air cells are unopacified. CT CERVICAL SPINE FINDINGS Alignment: Straightening of the cervical spine. No facet subluxation. Dens is well positioned between the lateral masses of C1. Stable 3 mm anterolisthesis at C3-4. Skull base and vertebrae: No acute fracture. No primary bone lesion or focal pathologic process. Soft tissues and spinal canal: No prevertebral edema. No visible canal hematoma. Disc levels: Severe multilevel degenerative disc disease throughout the cervical spine, unchanged. Advanced bilateral facet arthropathy. Moderate degenerative foraminal stenosis  bilaterally at C5-6 and on the right at C6-7. Upper chest: No acute abnormality. Other: Visualized mastoid air cells appear clear.  No discrete thyroid nodules. No pathologically enlarged cervical nodes. IMPRESSION: 1. No evidence of acute intracranial abnormality. No evidence of calvarial fracture. 2. Mild chronic small vessel ischemic changes in the cerebral white matter. 3. No cervical spine fracture or facet subluxation. 4. Severe degenerative changes in the cervical spine as detailed. Electronically Signed   By: Ilona Sorrel M.D.   On: 06/14/2018 05:33   Stacy Davis  Result Date: 06/14/2018 CLINICAL DATA:  73 year old with dementia being evaluated for possible seizures. Abnormal EEG today in the right frontotemporal region. EXAM: MRI HEAD WITHOUT Davis TECHNIQUE: Multiplanar, multiecho pulse sequences of the brain and surrounding structures were obtained without intravenous Davis. COMPARISON:  Brain MRI 04/12/2018 and earlier. Head CT without Davis earlier today. FINDINGS: Brain: No restricted diffusion to suggest acute infarction. No midline shift, mass effect, evidence of mass lesion, ventriculomegaly, extra-axial collection or acute intracranial hemorrhage. Cervicomedullary junction and pituitary are within normal limits. Pearline Cables and white matter signal throughout the brain appears stable since November. There is mild to moderate for age patchy nonspecific T2 and FLAIR hyperintensity in the bilateral corona radiata and basal ganglia. No cortical encephalomalacia or chronic cerebral blood products. Thin slice coronal T2 and FLAIR imaging of the temporal lobes. The T2 images are motion degraded, but the temporal lobe FLAIR signal appears symmetric and within normal limits. No heterotopia or migrational abnormality identified. Vascular: Major intracranial vascular flow voids are stable. There is mild generalized intracranial artery tortuosity, including resulting in mild mass effect on the  right medulla as before. Skull and upper cervical spine: Multilevel cervical spine degenerative changes with mild spondylolisthesis. Visualized bone marrow signal is within normal limits. Hyperostosis frontalis, normal variant. Sinuses/Orbits: Stable and negative. Other: Mastoids remain clear. Scalp and face soft tissues appear negative. IMPRESSION: 1.  No acute intracranial abnormality. 2. Negative aside from mild to moderate for age signal changes in the corona radiata and basal ganglia which are stable since November and likely small vessel disease related. Electronically Signed   By: Genevie Ann M.D.   On: 06/14/2018 18:52   Dg Hip Unilat With Pelvis 2-3 Views Right  Result Date: 06/14/2018 CLINICAL DATA:  Syncopal episode with fall and right hip pain EXAM: DG HIP (WITH OR WITHOUT PELVIS) 2-3V RIGHT COMPARISON:  None. FINDINGS: No pelvic fracture or diastasis. Surgical hardware overlies the lower lumbar spine. No right hip fracture or dislocation. Minimal osteoarthritis in both hip joints. No suspicious focal osseous lesions. Small enthesophytes at the right greater trochanter. IMPRESSION: No fracture or malalignment. Electronically Signed   By: Ilona Sorrel M.D.   On: 06/14/2018 05:21    Medications:  Scheduled: . donepezil  5 mg Oral QHS  . enoxaparin (LOVENOX) injection  40 mg Subcutaneous Q24H  . fludrocortisone  0.1 mg Oral Daily  . levothyroxine  88 mcg Oral Daily  . pantoprazole  40 mg Oral Daily  . sertraline  50 mg Oral QPC supper   EEG: 1) Focal right fronto-temporal sharp waves 2) Generalized irregular slow activity Clinical Interpretation: This EEG is consistent with an area of potential epileptogenicity in the right frontotemporal region. There was no seizure recorded on this study.   Assessment: 73 year old female presenting for assessment of spells of LOC x 3, with associated seizure-like activity on 2 out of 3 occasions, but not with the most recent event.  1. EEG reveals focal  right fronto-temporal sharp waves, consistent with an area of potential epileptogenicity. Initiation of an anticonvulsant is indicated.  2. Although she has a diagnosis of Alzheimer dementia, Lewy body dementia is an alternate differential diagnostic consideration based on her history of formed visual hallucinations of people, hand tremor and fluctuating cognition, as based on husband's description. She will need outpatient neurology follow up for this.  3. MRI reveals diffuse cerebral atrophy and mild mild to moderate for age signal changes in the corona radiata and basal ganglia which are stable since November and likely small vessel disease related.   Recommendations: 1. Load with Keppra 1000 mg x 1, followed by scheduled dosing at 500 mg PO BID.  2. Has been started on Aricept 5 mg po qhs 3. Avoid neuroleptic medications 4. Outpatient neurology follow up in 2-4 weeks.  5. Neurology will sign off. Please call if there are additional questions.    LOS: 0 days   @Electronically  signed: Dr. Kerney Elbe 06/15/2018  7:03 AM

## 2018-06-15 NOTE — Progress Notes (Signed)
PROGRESS NOTE  Stacy Davis JSH:[id number] DOB: Jun 08, 1945 DOA: 06/14/2018 PCP: Susy Frizzle, MD  HPI/Recap of past 24 hours: Stacy Davis is a 73 y.o. female with history h/o hypertension, hyperlipidemia, Von Willebrand disease, diet-controlled diabetes mellitus type 2, GERD, hypothyroidism, anxiety, Alzheimer's dementia presented with recurrent syncopal/collapse episode.  Patient was admitted in August 2019 with similar presentation.  According to the husband patient has had 3 episodes so far where she suddenly loses consciousness and suffers a fall.  The first episode (the only witnessed episode by her husband) happened in early 2019 when patient apparently was walking around the bed and suddenly collapsed with drooling from the mouth and jerking movements in all extremities.  There is no report of bowel or bladder incontinence or tongue bite during this episode.  The second episode happened in August when patient collapsed on the kitchen floor and husband again found her unconscious with jerking movements in extremities.  She was admitted to the hospital and seen by neurosurgery for subarachnoid hemorrhage that was noted on CT as a consequence of the fall.  The third episode happened around 2 AM last night when patient was found on the edge of the bed and the sound of her fall woke husband up.  Patient however did not have any jerking movements this time but appeared to have agonal and coarse respirations.  Firemen apparently arrived before EMS and bagged her.  Upon EMS arrival within 10 minutes patient was awake and responding.CBG on EMS eval 160, o2 sat 96% RA.  Patient currently denies any headache or joint pains.  She does not recollect falling or losing consciousness.  She believes she was trying to get up to use the bathroom.  Of note, patient does have a history of SVT which husband describes as "panic attack" for which she sees Dr. Quay Burow.  She denies any complaints of chest  pain or palpitations or dizzy spells preceding the episodes.  Work-up in the ED revealed unremarkable CT head with no contrast.  Neurology consulted.  EEG completed and revealed focal right frontotemporal sharp waves consistent with an area of potential epileptogenicity.  06/15/2018: Patient seen and examined with husband at bedside.  She is somnolent but easily arousable to voices.  Positive EEG suggestive of potential seizures.  Started on antiepileptic today by neurology.  Monitor for any acute changes.   Assessment/Plan: Active Problems:   Syncope  Newly diagnosed seizure disorder with syncope/collapse:  Abnormal EEG Started on antiepileptic by neurology Loaded with 1000 mg of Keppra Continue Keppra p.o. 5 mg twice daily as recommended by neurology  Alzheimer's dementia: Started on Aricept by neurology, continue  Husband would like her to return home when hemodynamically stable  Impaired glucose tolerance Diet-controlled Last hemoglobin A1c 6.0 on 10/08/2017 Obtain hemoglobin A1c  Hypothyroidism Continue levothyroxine  GERD Continue omeprazole  Chronic depression/anxiety Resume Zoloft  Von Willebrand's disease: No signs of bleeding.  Will avoid anticoagulants.  Ambulatory dysfunction PT to assess Fall precautions  Risk: High risk for decompensation due to newly diagnosed seizure disorder requiring new antiepileptic medication, multiple comorbidities, and advanced age.   DVT prophylaxis: SCDs  Code Status: Husband stated he would want patient to be resuscitated in case of medical emergency.  Even though prior hospitalization patient's CODE STATUS was DNR he stated they intend to reinstate DNR only if patient has terminal illness.  Full code for now  Family Communication:  Discussed with husband at bedside.  All questions answered to her  satisfaction. Consults called: Cardiology/neurology Disposition: Patient husband would like return to home when medically  cleared    Objective: Vitals:   06/15/18 0606 06/15/18 0608 06/15/18 0610 06/15/18 0613  BP: 100/70 97/77 100/63 109/80  Pulse: 64 68 80 81  Resp: 18     Temp: 98 F (36.7 C)     TempSrc: Oral     SpO2: 93%     Weight:      Height:        Intake/Output Summary (Last 24 hours) at 06/15/2018 1336 Last data filed at 06/15/2018 1229 Gross per 24 hour  Intake 360 ml  Output 800 ml  Net -440 ml   Filed Weights   06/14/18 0415 06/14/18 1000 06/15/18 0500  Weight: 72 kg 68.6 kg 68.4 kg    Exam:  . General: 73 y.o. year-old female well developed well nourished somnolent but arousable to voices. . Cardiovascular: Regular rate and rhythm with no rubs or gallops.  No thyromegaly or JVD noted.   Marland Kitchen Respiratory: Clear to auscultation with no wheezes or rales. Good inspiratory effort. . Abdomen: Soft nontender nondistended with normal bowel sounds x4 quadrants. . Musculoskeletal: Trace lower extremity edema. 2/4 pulses in all 4 extremities.   Data Reviewed: CBC: Recent Labs  Lab 06/14/18 0427 06/14/18 0442  WBC 8.6  --   NEUTROABS 6.0  --   HGB 13.0 13.3  HCT 42.2 39.0  MCV 85.4  --   PLT 221  --    Basic Metabolic Panel: Recent Labs  Lab 06/14/18 0442 06/15/18 0522  NA 138 138  K 4.0 3.4*  CL 107 102  CO2  --  23  GLUCOSE 115* 101*  BUN 17 13  CREATININE 1.10* 1.09*  CALCIUM  --  9.2   GFR: Estimated Creatinine Clearance: 44.3 mL/min (A) (by C-G formula based on SCr of 1.09 mg/dL (H)). Liver Function Tests: No results for input(s): AST, ALT, ALKPHOS, BILITOT, PROT, ALBUMIN in the last 168 hours. No results for input(s): LIPASE, AMYLASE in the last 168 hours. No results for input(s): AMMONIA in the last 168 hours. Coagulation Profile: No results for input(s): INR, PROTIME in the last 168 hours. Cardiac Enzymes: No results for input(s): CKTOTAL, CKMB, CKMBINDEX, TROPONINI in the last 168 hours. BNP (last 3 results) No results for input(s): PROBNP in the  last 8760 hours. HbA1C: No results for input(s): HGBA1C in the last 72 hours. CBG: No results for input(s): GLUCAP in the last 168 hours. Lipid Profile: No results for input(s): CHOL, HDL, LDLCALC, TRIG, CHOLHDL, LDLDIRECT in the last 72 hours. Thyroid Function Tests: Recent Labs    06/14/18 1020  TSH 4.032   Anemia Panel: No results for input(s): VITAMINB12, FOLATE, FERRITIN, TIBC, IRON, RETICCTPCT in the last 72 hours. Urine analysis:    Component Value Date/Time   COLORURINE YELLOW 06/14/2018 0606   APPEARANCEUR CLEAR 06/14/2018 0606   LABSPEC 1.020 06/14/2018 0606   PHURINE 5.0 06/14/2018 0606   GLUCOSEU NEGATIVE 06/14/2018 0606   HGBUR SMALL (A) 06/14/2018 0606   BILIRUBINUR NEGATIVE 06/14/2018 0606   BILIRUBINUR negative 06/10/2012 1631   KETONESUR NEGATIVE 06/14/2018 0606   PROTEINUR NEGATIVE 06/14/2018 0606   UROBILINOGEN negative 06/10/2012 1631   NITRITE NEGATIVE 06/14/2018 0606   LEUKOCYTESUR NEGATIVE 06/14/2018 0606   Sepsis Labs: @LABRCNTIP (procalcitonin:4,lacticidven:4)  )No results found for this or any previous visit (from the past 240 hour(s)).    Studies: Mr Brain 20 Contrast  Result Date: 06/14/2018 CLINICAL DATA:  73 year old with  dementia being evaluated for possible seizures. Abnormal EEG today in the right frontotemporal region. EXAM: MRI HEAD WITHOUT CONTRAST TECHNIQUE: Multiplanar, multiecho pulse sequences of the brain and surrounding structures were obtained without intravenous contrast. COMPARISON:  Brain MRI 04/12/2018 and earlier. Head CT without contrast earlier today. FINDINGS: Brain: No restricted diffusion to suggest acute infarction. No midline shift, mass effect, evidence of mass lesion, ventriculomegaly, extra-axial collection or acute intracranial hemorrhage. Cervicomedullary junction and pituitary are within normal limits. Pearline Cables and white matter signal throughout the brain appears stable since November. There is mild to moderate for age  patchy nonspecific T2 and FLAIR hyperintensity in the bilateral corona radiata and basal ganglia. No cortical encephalomalacia or chronic cerebral blood products. Thin slice coronal T2 and FLAIR imaging of the temporal lobes. The T2 images are motion degraded, but the temporal lobe FLAIR signal appears symmetric and within normal limits. No heterotopia or migrational abnormality identified. Vascular: Major intracranial vascular flow voids are stable. There is mild generalized intracranial artery tortuosity, including resulting in mild mass effect on the right medulla as before. Skull and upper cervical spine: Multilevel cervical spine degenerative changes with mild spondylolisthesis. Visualized bone marrow signal is within normal limits. Hyperostosis frontalis, normal variant. Sinuses/Orbits: Stable and negative. Other: Mastoids remain clear. Scalp and face soft tissues appear negative. IMPRESSION: 1.  No acute intracranial abnormality. 2. Negative aside from mild to moderate for age signal changes in the corona radiata and basal ganglia which are stable since November and likely small vessel disease related. Electronically Signed   By: Genevie Ann M.D.   On: 06/14/2018 18:52    Scheduled Meds: . donepezil  5 mg Oral QHS  . enoxaparin (LOVENOX) injection  40 mg Subcutaneous Q24H  . fludrocortisone  0.1 mg Oral Daily  . levETIRAcetam  500 mg Oral BID  . levothyroxine  88 mcg Oral Daily  . pantoprazole  40 mg Oral Daily  . sertraline  50 mg Oral QPC supper    Continuous Infusions:   LOS: 0 days     Stacy Memos, MD [facility] Pager [phone]  If 7PM-7AM, please contact night-coverage [email or web address] Password [email or web address] 06/15/2018, 1:36 PM

## 2018-06-15 NOTE — Plan of Care (Signed)

## 2018-06-16 DIAGNOSIS — R413 Other amnesia: Secondary | ICD-10-CM

## 2018-06-16 DIAGNOSIS — R569 Unspecified convulsions: Secondary | ICD-10-CM

## 2018-06-16 LAB — BASIC METABOLIC PANEL
Anion gap: 9 (ref 5–15)
BUN: 16 mg/dL (ref 8–23)
CO2: 25 mmol/L (ref 22–32)
Calcium: 9.1 mg/dL (ref 8.9–10.3)
Chloride: 106 mmol/L (ref 98–111)
Creatinine, Ser: 1.08 mg/dL — ABNORMAL HIGH (ref 0.44–1.00)
GFR calc Af Amer: 60 mL/min — ABNORMAL LOW (ref >60)
GFR calc non Af Amer: 52 mL/min — ABNORMAL LOW (ref >60)
Glucose, Bld: 98 mg/dL (ref 70–99)
Potassium: 3.8 mmol/L (ref 3.5–5.1)
Sodium: 140 mmol/L (ref 135–145)

## 2018-06-16 LAB — CBC
HCT: 37.3 % (ref 36.0–46.0)
Hemoglobin: 11.8 g/dL — ABNORMAL LOW (ref 12.0–15.0)
MCH: 26.6 pg (ref 26.0–34.0)
MCHC: 31.6 g/dL (ref 30.0–36.0)
MCV: 84.2 fL (ref 80.0–100.0)
Platelets: 212 10*3/uL (ref 150–400)
RBC: 4.43 MIL/uL (ref 3.87–5.11)
RDW: 14.5 % (ref 11.5–15.5)
WBC: 7.9 10*3/uL (ref 4.0–10.5)
nRBC: 0 % (ref 0.0–0.2)

## 2018-06-16 MED ORDER — DONEPEZIL HCL 5 MG PO TABS: 5.0000 mg | ORAL_TABLET | Freq: Every day | ORAL | 0 refills | Status: DC

## 2018-06-16 MED ORDER — FLUDROCORTISONE ACETATE 0.1 MG PO TABS: 0.1000 mg | ORAL_TABLET | Freq: Every day | ORAL | 0 refills | Status: DC

## 2018-06-16 MED ORDER — LEVETIRACETAM 500 MG PO TABS: 500.0000 mg | ORAL_TABLET | Freq: Two times a day (BID) | ORAL | 0 refills | Status: DC

## 2018-06-16 MED FILL — DONEPEZIL HCL 5 MG TABS: 5 | 30 days supply | Qty: 30 | Fill #0

## 2018-06-16 MED FILL — levETIRAcetam 500 MG TABS: 500 | 30 days supply | Qty: 60 | Fill #0

## 2018-06-16 MED FILL — FLUDROCORTISONE 0.1 MG TAB: 0.1 | 30 days supply | Qty: 30 | Fill #0

## 2018-06-16 NOTE — Progress Notes (Signed)
PT Cancellation Note  Patient Details Name: Stacy Davis MRN: [id number] DOB: [birth year]   Cancelled Treatment:    Reason Eval/Treat Not Completed: Medical issues which prohibited therapy(Pt unarousable.  Had Xanax/Vicodin. Will check back in pm. )   Denice Paradise 06/16/2018, 9:35 AM  [facility] Pager:  [phone]  Office:  [phone]

## 2018-06-16 NOTE — Progress Notes (Signed)
Patient ready for discharge. 

## 2018-06-16 NOTE — Discharge Summary (Signed)
Stacy Davis, is a 73 y.o. female  DOB 03-16-1946  MRN [id number].  Admission date:  06/14/2018  Admitting Physician  Guilford Shi, MD  Discharge Date:  06/16/2018   Primary MD  Susy Frizzle, MD  Recommendations for primary care physician for things to follow:  -We will need to follow-up with neurology as an outpatient regarding her seizures -Consider adding Midodrin if patient is having recurrent orthostasis  Admission Diagnosis  Syncope and collapse [R55] Pain [R52] Fall, initial encounter [W19.XXXA]   Discharge Diagnosis  Syncope and collapse [R55] Pain [R52] Fall, initial encounter [W19.XXXA]    Active Problems:   Syncope   Seizures ([place])      Past Medical History:  Diagnosis Date  . Anxiety   . Arthritis    ?OA vs Rheum (established with Dr. Jefm Bryant pending blood work)  . Articular cartilage disorder of shoulder 04/2012   right  . Back pain    h/o DDD since 2010  . Dementia ([place])   . Dental crowns present    also dental caps  . Diabetes mellitus    NIDDM  . Fecal incontinence   . GERD (gastroesophageal reflux disease)   . Headache(784.0)    occasional  . History of skin cancer   . Hyperlipidemia    no current med.  . Hypertension   . Hyperthyroidism   . Hypothyroid   . Impingement syndrome of right shoulder 04/2012  . Memory impairment    dementia- worsenign 12/2016 with medication non compliance and delusions.    . Right rotator cuff tear 05/15/2012  . Rotator cuff rupture 04/2012   right  . Von Willebrand disease ([place])     Past Surgical History:  Procedure Laterality Date  . APPENDECTOMY    . BREAST SURGERY     hematoma evacuation due to trauma  . CARDIAC CATHETERIZATION  03/06/2001  . CESAREAN SECTION     x 2  . COLONOSCOPY  01/20/2006 per patient  . FRACTURE SURGERY    . KNEE ARTHROSCOPY Right 12/01/2014   Procedure: ARTHROSCOPY KNEE,partial  medial menisectomy and plica excision;  Surgeon: Hessie Knows, MD;  Location: [facility];  Service: Orthopedics;  Laterality: Right;  . LUMBAR LAMINECTOMY/DECOMPRESSION MICRODISCECTOMY  03/01/2009   right L4-5; arthrodesis L4-5  . ORIF WRIST FRACTURE     left  . SHOULDER ARTHROSCOPY WITH ROTATOR CUFF REPAIR AND SUBACROMIAL DECOMPRESSION  05/15/2012   Procedure: SHOULDER ARTHROSCOPY WITH ROTATOR CUFF REPAIR AND SUBACROMIAL DECOMPRESSION;  Surgeon: Johnny Bridge, MD;  Location: [facility];  Service: Orthopedics;  Laterality: Right;  RIGHT ARTHROSCOPY SHOULDER DEBRIDEMENT LIMITED, ARTHROSCOPY SHOULDER DECOMPRESSION SUBACROMIAL PARTIAL ACROMIOPLASTY WITH CORACOACROMIAL RELEASE, ARTHROSCOPY SHOULDER WITH ROTATOR CUFF REPAIR  . TUBAL LIGATION         History of present illness and  Hospital Course:     Kindly see H&P for history of present illness and admission details, please review complete Labs, Consult reports and Test reports for all details in brief  HPI  from the history  and physical done on the day of admission 06/14/2018  HPI: Stacy Davis is a 73 y.o. female with history h/o hypertension, hyperlipidemia, Von Willebrand disease, diet-controlled diabetes mellitus type 2, GERD, hypothyroidism, anxiety, dementia presented with a syncopal/collapse episode.  Patient was admitted in August 2019 with similar presentation.  Patient has dementia with memory issues.  She is pleasant and communicative but oriented to place and person only.  Most of the history obtained by talking to husband bedside who is the primary caregiver for her.  According to the husband patient has had 3 episodes so far where she suddenly loses consciousness and suffers a fall.  The first episode (the only witnessed episode by her husband) happened in early 2019 when patient apparently was walking around the bed and suddenly collapsed with drooling from the mouth and jerking movements in all extremities.  There  is no report of bowel or bladder incontinence or tongue bite during this episode.  The second episode happened in August when patient collapsed on the kitchen floor and husband again found her unconscious with jerking movements in extremities.  She was admitted to the hospital and seen by neurosurgery for subarachnoid hemorrhage that was noted on CT as a consequence of the fall.  The third episode happened around 2 AM last night when patient was found on the edge of the bed and the sound of her fall woke husband up.  Patient however did not have any jerking movements this time but appeared to have agonal and coarse respirations.  Firemen apparently arrived before EMS and bagged her.  Upon EMS arrival within 10 minutes patient was awake and responding.CBG on EMS eval 160, o2 sat 96% RA.  Patient currently denies any headache or joint pains.  She does not recollect falling or losing consciousness.  She believes she was trying to get up to use the bathroom.  Of note, patient does have a history of SVT which husband describes as "panic attack" for which she sees Dr. Quay Burow.  She denies any complaints of chest pain or palpitations or dizzy spells preceding the episodes.  Work-up in the ED today unremarkable with CT head : no acute abnormality, CT cervical spine : severe DDD , right hip x ray -ve for fractures/dislocation. Labs wnl.  She is requested to be admitted for further evaluation and management.   Hospital Course    Seizure  -New diagnosis, abnormal EEG with focal right frontotemporal sharp waves, consistent with an area of potential epileptogenicity , neurology consult appreciated, initiation of anticonvulsant is indicated, he was loaded with 1 g of Keppra, to continue Keppra 500 mg oral twice daily . -Precautions were discussed at length with the husband, is aware of no no driving lawnmower (she was told during that ), no baths, but shower, no water activity, hiking or high-altitude  activities . -Bilaterally referral has been [facility] to [facility] neurology abnormal EEG -Patient was quite altered since admission, she was altered yesterday as well, today reports she is back to her normal self.  Recurrent syncope -Continues to have declining health, cardiology input greatly appreciated, her orthostatic hypotension felt contributing to her syncope, her oral intake and salt intake are poor, she was started on Florinef dose, will start TED hose as well, if this becomes a recurrent problem then may need Midodrin.  dementia: -started on Aricept during hospital stay, he has diagnosis of Alzheimer's dementia, but perineal area neurology [place] body dementia is an alternate differential diagnostic consideration giving history of  visual hallucinations, hand tremors and fluctuating cognition.  Impaired glucose tolerance Diet-controlled Last hemoglobin A1c 6.0 on 10/08/2017  Hypothyroidism Continue levothyroxine  GERD Continue omeprazole  Chronic depression/anxiety Resume Zoloft  Von Willebrand's disease: No signs of bleeding. Will avoid anticoagulants.  Ambulatory dysfunction Seen by PT, no home needs   Discharge Condition:  stable   Follow UP  Follow-up Information    Susy Frizzle, MD Follow up in 1 week(s).   Specialty:  Family Medicine Contact information: [address] [place] [address] [phone]             Discharge Instructions  and  Discharge Medications     Discharge Instructions    Ambulatory referral to Neurology   Complete by:  As directed    An appointment is requested in approximately: 2 weeks  New onset seizures   Discharge instructions   Complete by:  As directed    Follow with Primary MD Susy Frizzle, MD in 7 days   Do not drive, operating heavy machinery, perform activities at heights, swimming or participation in water activities or provide baby sitting services if your were admitted for syncope or  siezures until you have seen by Primary MD or a Neurologist and advised to do so again.   Get CBC, CMP,  checked  by Primary MD next visit.    Activity: As tolerated with Full fall precautions use walker/cane & assistance as needed   Disposition Home    Diet: Regular diet  , with feeding assistance and aspiration precautions.  On your next visit with your primary care physician please Get Medicines reviewed and adjusted.   Please request your Prim.MD to go over all Hospital Tests and Procedure/Radiological results at the follow up, please get all Hospital records sent to your Prim MD by signing hospital release before you go home.   If you experience worsening of your admission symptoms, develop shortness of breath, life threatening emergency, suicidal or homicidal thoughts you must seek medical attention immediately by calling 911 or calling your MD immediately  if symptoms less severe.  You Must read complete instructions/literature along with all the possible adverse reactions/side effects for all the Medicines you take and that have been prescribed to you. Take any new Medicines after you have completely understood and accpet all the possible adverse reactions/side effects.   Do not drive, operating heavy machinery, perform activities at heights, swimming or participation in water activities or provide baby sitting services if your were admitted for syncope or siezures until you have seen by Primary MD or a Neurologist and advised to do so again.  Do not drive when taking Pain medications.    Do not take more than prescribed Pain, Sleep and Anxiety Medications  Special Instructions: If you have smoked or chewed Tobacco  in the last 2 yrs please stop smoking, stop any regular Alcohol  and or any Recreational drug use.  Wear Seat belts while driving.   Please note  You were cared for by a hospitalist during your hospital stay. If you have any questions about your discharge  medications or the care you received while you were in the hospital after you are discharged, you can call the unit and asked to speak with the hospitalist on call if the hospitalist that took care of you is not available. Once you are discharged, your primary care physician will handle any further medical issues. Please note that NO REFILLS for any discharge medications will  be authorized once you are discharged, as it is imperative that you return to your primary care physician (or establish a relationship with a primary care physician if you do not have one) for your aftercare needs so that they can reassess your need for medications and monitor your lab values.     Allergies as of 06/16/2018      Reactions   Tramadol Other (See Comments)   CHANGES IN MEMORY      Medication List    TAKE these medications   clotrimazole 1 % cream Commonly known as:  CLOTRIMAZOLE GRX Apply 1 application topically 2 (two) times daily.   donepezil 5 MG tablet Commonly known as:  ARICEPT Take 1 tablet (5 mg total) by mouth at bedtime.   fludrocortisone 0.1 MG tablet Commonly known as:  FLORINEF Take 1 tablet (0.1 mg total) by mouth daily.   levETIRAcetam 500 MG tablet Commonly known as:  KEPPRA Take 1 tablet (500 mg total) by mouth 2 (two) times daily.   levothyroxine 88 MCG tablet Commonly known as:  SYNTHROID, LEVOTHROID TAKE 1 TABLET BY MOUTH ONCE A DAY   nystatin powder Generic drug:  nystatin Apply topically daily as needed (under belly rash).   nystatin cream Commonly known as:  MYCOSTATIN Apply 1 application topically daily as needed (under belly rash).   omeprazole 40 MG capsule Commonly known as:  PRILOSEC TAKE 1 CAPSULE BY MOUTH ONCE DAILY What changed:  when to take this   OVER THE COUNTER MEDICATION Take 2 tablets by mouth 2 (two) times daily as needed (pain). "Back and Body" over the counter   sertraline 50 MG tablet Commonly known as:  ZOLOFT TAKE 1 TABLET BY MOUTH ONCE A  DAY What changed:  when to take this         Diet and Activity recommendation: See Discharge Instructions above   Consults obtained -  Neurology Cardiology   Major procedures and Radiology Reports - PLEASE review detailed and final reports for all details, in brief -     Dg Chest 2 View  Result Date: 06/14/2018 CLINICAL DATA:  Syncopal episode EXAM: CHEST - 2 VIEW COMPARISON:  04/11/2018 chest radiograph. FINDINGS: Stable cardiomediastinal silhouette with borderline mild cardiomegaly. No pneumothorax. No pleural effusion. Lungs appear clear, with no acute consolidative airspace disease and no pulmonary edema. Visualized osseous structures appear intact. IMPRESSION: Borderline mild cardiomegaly. No pulmonary edema. No active pulmonary disease. Electronically Signed   By: Ilona Sorrel M.D.   On: 06/14/2018 05:22   Ct Head Wo Contrast  Result Date: 06/14/2018 CLINICAL DATA:  Syncopal episode with fall at home. EXAM: CT HEAD WITHOUT CONTRAST CT CERVICAL SPINE WITHOUT CONTRAST TECHNIQUE: Multidetector CT imaging of the head and cervical spine was performed following the standard protocol without intravenous contrast. Multiplanar CT image reconstructions of the cervical spine were also generated. COMPARISON:  04/12/2018 head CT. 04/11/2018 head and cervical spine CT. FINDINGS: CT HEAD FINDINGS Brain: No evidence of parenchymal hemorrhage or extra-axial fluid collection. No mass lesion, mass effect, or midline shift. No CT evidence of acute infarction. Nonspecific mild subcortical and periventricular white matter hypodensity, most in keeping with chronic small vessel ischemic change. Cerebral volume is age appropriate. No ventriculomegaly. Vascular: No acute abnormality. Skull: No evidence of calvarial fracture. Sinuses/Orbits: The visualized paranasal sinuses are essentially clear. Other:  The mastoid air cells are unopacified. CT CERVICAL SPINE FINDINGS Alignment: Straightening of the cervical  spine. No facet subluxation. Dens is well positioned between the lateral  masses of C1. Stable 3 mm anterolisthesis at C3-4. Skull base and vertebrae: No acute fracture. No primary bone lesion or focal pathologic process. Soft tissues and spinal canal: No prevertebral edema. No visible canal hematoma. Disc levels: Severe multilevel degenerative disc disease throughout the cervical spine, unchanged. Advanced bilateral facet arthropathy. Moderate degenerative foraminal stenosis bilaterally at C5-6 and on the right at C6-7. Upper chest: No acute abnormality. Other: Visualized mastoid air cells appear clear. No discrete thyroid nodules. No pathologically enlarged cervical nodes. IMPRESSION: 1. No evidence of acute intracranial abnormality. No evidence of calvarial fracture. 2. Mild chronic small vessel ischemic changes in the cerebral white matter. 3. No cervical spine fracture or facet subluxation. 4. Severe degenerative changes in the cervical spine as detailed. Electronically Signed   By: Ilona Sorrel M.D.   On: 06/14/2018 05:33   Ct Cervical Spine Wo Contrast  Result Date: 06/14/2018 CLINICAL DATA:  Syncopal episode with fall at home. EXAM: CT HEAD WITHOUT CONTRAST CT CERVICAL SPINE WITHOUT CONTRAST TECHNIQUE: Multidetector CT imaging of the head and cervical spine was performed following the standard protocol without intravenous contrast. Multiplanar CT image reconstructions of the cervical spine were also generated. COMPARISON:  04/12/2018 head CT. 04/11/2018 head and cervical spine CT. FINDINGS: CT HEAD FINDINGS Brain: No evidence of parenchymal hemorrhage or extra-axial fluid collection. No mass lesion, mass effect, or midline shift. No CT evidence of acute infarction. Nonspecific mild subcortical and periventricular white matter hypodensity, most in keeping with chronic small vessel ischemic change. Cerebral volume is age appropriate. No ventriculomegaly. Vascular: No acute abnormality. Skull: No evidence of  calvarial fracture. Sinuses/Orbits: The visualized paranasal sinuses are essentially clear. Other:  The mastoid air cells are unopacified. CT CERVICAL SPINE FINDINGS Alignment: Straightening of the cervical spine. No facet subluxation. Dens is well positioned between the lateral masses of C1. Stable 3 mm anterolisthesis at C3-4. Skull base and vertebrae: No acute fracture. No primary bone lesion or focal pathologic process. Soft tissues and spinal canal: No prevertebral edema. No visible canal hematoma. Disc levels: Severe multilevel degenerative disc disease throughout the cervical spine, unchanged. Advanced bilateral facet arthropathy. Moderate degenerative foraminal stenosis bilaterally at C5-6 and on the right at C6-7. Upper chest: No acute abnormality. Other: Visualized mastoid air cells appear clear. No discrete thyroid nodules. No pathologically enlarged cervical nodes. IMPRESSION: 1. No evidence of acute intracranial abnormality. No evidence of calvarial fracture. 2. Mild chronic small vessel ischemic changes in the cerebral white matter. 3. No cervical spine fracture or facet subluxation. 4. Severe degenerative changes in the cervical spine as detailed. Electronically Signed   By: Ilona Sorrel M.D.   On: 06/14/2018 05:33   Mr Brain Wo Contrast  Result Date: 06/14/2018 CLINICAL DATA:  73 year old with dementia being evaluated for possible seizures. Abnormal EEG today in the right frontotemporal region. EXAM: MRI HEAD WITHOUT CONTRAST TECHNIQUE: Multiplanar, multiecho pulse sequences of the brain and surrounding structures were obtained without intravenous contrast. COMPARISON:  Brain MRI 04/12/2018 and earlier. Head CT without contrast earlier today. FINDINGS: Brain: No restricted diffusion to suggest acute infarction. No midline shift, mass effect, evidence of mass lesion, ventriculomegaly, extra-axial collection or acute intracranial hemorrhage. Cervicomedullary junction and pituitary are within normal  limits. Pearline Cables and white matter signal throughout the brain appears stable since November. There is mild to moderate for age patchy nonspecific T2 and FLAIR hyperintensity in the bilateral corona radiata and basal ganglia. No cortical encephalomalacia or chronic cerebral blood products. Thin slice coronal T2 and FLAIR  imaging of the temporal lobes. The T2 images are motion degraded, but the temporal lobe FLAIR signal appears symmetric and within normal limits. No heterotopia or migrational abnormality identified. Vascular: Major intracranial vascular flow voids are stable. There is mild generalized intracranial artery tortuosity, including resulting in mild mass effect on the right medulla as before. Skull and upper cervical spine: Multilevel cervical spine degenerative changes with mild spondylolisthesis. Visualized bone marrow signal is within normal limits. Hyperostosis frontalis, normal variant. Sinuses/Orbits: Stable and negative. Other: Mastoids remain clear. Scalp and face soft tissues appear negative. IMPRESSION: 1.  No acute intracranial abnormality. 2. Negative aside from mild to moderate for age signal changes in the corona radiata and basal ganglia which are stable since November and likely small vessel disease related. Electronically Signed   By: Genevie Ann M.D.   On: 06/14/2018 18:52   Dg Hip Unilat With Pelvis 2-3 Views Right  Result Date: 06/14/2018 CLINICAL DATA:  Syncopal episode with fall and right hip pain EXAM: DG HIP (WITH OR WITHOUT PELVIS) 2-3V RIGHT COMPARISON:  None. FINDINGS: No pelvic fracture or diastasis. Surgical hardware overlies the lower lumbar spine. No right hip fracture or dislocation. Minimal osteoarthritis in both hip joints. No suspicious focal osseous lesions. Small enthesophytes at the right greater trochanter. IMPRESSION: No fracture or malalignment. Electronically Signed   By: Ilona Sorrel M.D.   On: 06/14/2018 05:21    Micro Results    No results found for this or  any previous visit (from the past 240 hour(s)).     Today   Subjective:   Dellie Catholic with no significant events overnight, patient denies any complaints today.  Objective:   Blood pressure 107/65, pulse 67, temperature 97.7 F (36.5 C), temperature source Oral, resp. rate 18, height 5\' 6"  (1.676 m), weight 67.7 kg, SpO2 97 %.   Intake/Output Summary (Last 24 hours) at 06/16/2018 1032 Last data filed at 06/16/2018 1000 Gross per 24 hour  Intake 720 ml  Output 1051 ml  Net -331 ml    Exam Sleeping comfortably in the bed, open her eyes to verbal stimuli, confused Symmetrical Chest wall movement, Good air movement bilaterally, CTAB RRR,No Gallops,Rubs or new Murmurs, No Parasternal Heave +ve B.Sounds, Abd Soft, Non tender,  No rebound -guarding or rigidity. No Cyanosis, Clubbing or edema, No new Rash or bruise  Data Review   CBC w Diff:  Lab Results  Component Value Date   WBC 7.9 06/16/2018   HGB 11.8 (L) 06/16/2018   HGB 12.1 02/23/2009   HCT 37.3 06/16/2018   HCT 34.7 (L) 02/23/2009   PLT 212 06/16/2018   PLT 273 02/23/2009   LYMPHOPCT 23 06/14/2018   LYMPHOPCT 28.5 02/23/2009   MONOPCT 6 06/14/2018   MONOPCT 5.3 02/23/2009   EOSPCT 1 06/14/2018   EOSPCT 2.1 02/23/2009   BASOPCT 1 06/14/2018   BASOPCT 0.8 02/23/2009    CMP:  Lab Results  Component Value Date   NA 140 06/16/2018   K 3.8 06/16/2018   CL 106 06/16/2018   CO2 25 06/16/2018   BUN 16 06/16/2018   CREATININE 1.08 (H) 06/16/2018   CREATININE 0.85 10/08/2017   PROT 7.4 04/11/2018   ALBUMIN 4.3 04/11/2018   BILITOT QUANTITY NOT SUFFICIENT, UNABLE TO PERFORM TEST 04/11/2018   ALKPHOS 85 04/11/2018   AST 43 (H) 04/11/2018   ALT QUANTITY NOT SUFFICIENT, UNABLE TO PERFORM TEST 04/11/2018  .   Total Time in preparing paper work, data evaluation and todays exam -  35 minutes  Phillips Climes M.D on 06/16/2018 at 10:32 AM  [facility]   Office  [phone]

## 2018-06-16 NOTE — Discharge Instructions (Signed)
Follow with Primary MD Susy Frizzle, MD in 7 days   Do not drive, operating heavy machinery, perform activities at heights, swimming or participation in water activities or provide baby sitting services if your were admitted for syncope or siezures until you have seen by Primary MD or a Neurologist and advised to do so again.   Get CBC, CMP,  checked  by Primary MD next visit.    Activity: As tolerated with Full fall precautions use walker/cane & assistance as needed   Disposition Home    Diet: Regular diet  , with feeding assistance and aspiration precautions.  On your next visit with your primary care physician please Get Medicines reviewed and adjusted.   Please request your Prim.MD to go over all Hospital Tests and Procedure/Radiological results at the follow up, please get all Hospital records sent to your Prim MD by signing hospital release before you go home.   If you experience worsening of your admission symptoms, develop shortness of breath, life threatening emergency, suicidal or homicidal thoughts you must seek medical attention immediately by calling 911 or calling your MD immediately  if symptoms less severe.  You Must read complete instructions/literature along with all the possible adverse reactions/side effects for all the Medicines you take and that have been prescribed to you. Take any new Medicines after you have completely understood and accpet all the possible adverse reactions/side effects.   Do not drive, operating heavy machinery, perform activities at heights, swimming or participation in water activities or provide baby sitting services if your were admitted for syncope or siezures until you have seen by Primary MD or a Neurologist and advised to do so again.  Do not drive when taking Pain medications.    Do not take more than prescribed Pain, Sleep and Anxiety Medications  Special Instructions: If you have smoked or chewed Tobacco  in the last 2 yrs  please stop smoking, stop any regular Alcohol  and or any Recreational drug use.  Wear Seat belts while driving.   Please note  You were cared for by a hospitalist during your hospital stay. If you have any questions about your discharge medications or the care you received while you were in the hospital after you are discharged, you can call the unit and asked to speak with the hospitalist on call if the hospitalist that took care of you is not available. Once you are discharged, your primary care physician will handle any further medical issues. Please note that NO REFILLS for any discharge medications will be authorized once you are discharged, as it is imperative that you return to your primary care physician (or establish a relationship with a primary care physician if you do not have one) for your aftercare needs so that they can reassess your need for medications and monitor your lab values.

## 2018-06-16 NOTE — Plan of Care (Signed)
  Problem: Health Behavior/Discharge Planning: Goal: Ability to manage health-related needs will improve Outcome: Progressing   

## 2018-06-16 NOTE — Progress Notes (Addendum)
Pt keeps trying to get out of bed. Pt is not following rules to stay in bed until personnel is able to come help

## 2018-06-16 NOTE — Consult Note (Signed)
   [facility] CM Inpatient Consult   06/16/2018  VENA BASSINGER [birth year] [id number]  Patient screened for potential [facility] services. Chart review reveals from MD notes: Rory Montel Hutchensis a 73 y.o.femalewith history h/ohypertension, hyperlipidemia,Von Willebrand disease, diet-controlled diabetes mellitus type 2, GERD, hypothyroidism, anxiety, dementiapresented with a syncopal/collapse episode. Patient was admitted in August 2019 with similar presentation. Patient has dementia with memory issues. She is pleasant and communicative but oriented to place and person only. Most of the history obtained by talking to husband bedside who is the primary caregiver for her. Met with the patient and husband regarding [facility] [facility] services available.  Patient alert and oriented to self.  Husband denies any current needs.  He provides transportation to appointments, he denied any medication issues for follow up. He did accept a brochure and 24 hour magnet and states, "we have a magnet on our refrigerator but I'll take another one."  Encouraged to call for needs.  No current needs assessed.  For questions,  Natividad Brood, RN BSN [facility] Liaison  [phone] business mobile phone Toll free office [phone]

## 2018-06-25 ENCOUNTER — Ambulatory Visit (INDEPENDENT_AMBULATORY_CARE_PROVIDER_SITE_OTHER): Payer: PPO | Admitting: Family Medicine

## 2018-06-25 ENCOUNTER — Encounter: Payer: Self-pay | Admitting: Family Medicine

## 2018-06-25 VITALS — BP 120/82 | HR 60 | Temp 98.5°F | Resp 14 | Ht 63.0 in | Wt 157.0 lb

## 2018-06-25 DIAGNOSIS — R569 Unspecified convulsions: Secondary | ICD-10-CM

## 2018-06-25 DIAGNOSIS — E039 Hypothyroidism, unspecified: Secondary | ICD-10-CM

## 2018-06-25 DIAGNOSIS — R42 Dizziness and giddiness: Secondary | ICD-10-CM | POA: Diagnosis not present

## 2018-06-25 DIAGNOSIS — E118 Type 2 diabetes mellitus with unspecified complications: Secondary | ICD-10-CM | POA: Diagnosis not present

## 2018-06-25 DIAGNOSIS — I609 Nontraumatic subarachnoid hemorrhage, unspecified: Secondary | ICD-10-CM | POA: Diagnosis not present

## 2018-06-25 DIAGNOSIS — R413 Other amnesia: Secondary | ICD-10-CM | POA: Diagnosis not present

## 2018-06-25 DIAGNOSIS — R55 Syncope and collapse: Secondary | ICD-10-CM | POA: Diagnosis not present

## 2018-06-25 DIAGNOSIS — I10 Essential (primary) hypertension: Secondary | ICD-10-CM | POA: Diagnosis not present

## 2018-06-25 DIAGNOSIS — F0390 Unspecified dementia without behavioral disturbance: Secondary | ICD-10-CM | POA: Diagnosis not present

## 2018-06-25 NOTE — Progress Notes (Signed)
Subjective:    Patient ID: Stacy Davis, female    DOB: 11/07/1945, 73 y.o.   MRN: [id number]  HPI Patient has been recently admitted to the hospital on several occasions.  I have not seen the patient in quite some time (08/2016). I have copied relevant portions of her most recent discharge summary below for my reference:  Recommendations for primary care physician for things to follow:  -We will need to follow-up with neurology as an outpatient regarding her seizures -Consider adding Midodrin if patient is having recurrent orthostasis  Admission Diagnosis  Syncope and collapse [R55] Pain [R52] Fall, initial encounter [W19.XXXA]   Discharge Diagnosis  Syncope and collapse [R55] Pain [R52] Fall, initial encounter [W19.XXXA]    Active Problems:   Syncope   Seizures ([place]  History of present illness and  Hospital Course:     Kindly see H&P for history of present illness and admission details, please review complete Labs, Consult reports and Test reports for all details in brief  HPI  from the history and physical done on the day of admission 06/14/2018  [name] a 73 y.o.femalewith history h/ohypertension, hyperlipidemia,Von Willebrand disease, diet-controlled diabetes mellitus type 2, GERD, hypothyroidism, anxiety, dementiapresented with a syncopal/collapse episode. Patient was admitted in August 2019 with similar presentation. Patient has dementia with memory issues. She is pleasant and communicative but oriented to place and person only. Most of the history obtained by talking to husband bedside who is the primary caregiver for her. According to the husband patient has had 3 episodes so far where she suddenly loses consciousness and suffers a fall. The first episode (the only witnessed episode by her husband) happened in early 2019 when patient apparently was walking around the bed and suddenly collapsed with drooling from the mouth and jerking  movements in all extremities. There is no report of bowel or bladder incontinence or tongue bite during this episode. The second episode happened in August when patient collapsed on the kitchen floor and husband again found her unconscious with jerking movements in extremities. She was admitted to the hospital and seen by neurosurgery for subarachnoid hemorrhage that was noted on CT as a consequence of the fall. The third episode happened around 2 AM last night when patient was found on the edge of the bed and the sound of her fall woke husband up. Patient however did not have any jerking movements this time but appeared to have agonal and coarse respirations. Firemen apparently arrived before EMS and bagged her. Upon EMS arrival within 10 minutes patient was awake and responding.CBG on EMS eval 160, o2 sat 96% RA. Patient currently denies any headache or joint pains. She does not recollect falling or losing consciousness. She believes she was trying to get up to use the bathroom. Of note, patient does have a history of SVT which husband describes as "panic attack" for which she seesDr. Quay Burow.She denies any complaints of chest pain or palpitations or dizzy spells preceding the episodes.  Work-up in the ED today unremarkable withCT head : no acute abnormality, CT cervical spine : severe DDD , right hip x ray -ve for fractures/dislocation. Labs wnl.She is requested to be admitted for further evaluation and management.   Hospital Course    Seizure  -New diagnosis, abnormal EEG with focal right frontotemporal sharp waves, consistent with an area of potential epileptogenicity , neurology consult appreciated, initiation of anticonvulsant is indicated, he was loaded with 1 g of Keppra, to continue Keppra 500  mg oral twice daily . -Precautions were discussed at length with the husband, is aware of no no driving lawnmower (she was told during that ), no baths, but shower, no water  activity, hiking or high-altitude activities . -Bilaterally referral has been [facility] to [facility] neurology abnormal EEG -Patient was quite altered since admission, she was altered yesterday as well, today reports she is back to her normal self.  Recurrent syncope -Continues to have declining health, cardiology input greatly appreciated, her orthostatic hypotension felt contributing to her syncope, her oral intake and salt intake are poor, she was started on Florinef dose, will start TED hose as well, if this becomes a recurrent problem then may need Midodrin.  dementia: -started on Aricept during hospital stay, he has diagnosis of Alzheimer's dementia, but perineal area neurology Louis body dementia is an alternate differential diagnostic consideration giving history of visual hallucinations, hand tremors and fluctuating cognition.  Impaired glucose tolerance Diet-controlled Last hemoglobin A1c 6.0 on 10/08/2017  Hypothyroidism Continue levothyroxine  GERD Continue omeprazole  Chronic depression/anxiety Resume Zoloft  Von Willebrand's disease: No signs of bleeding. Will avoid anticoagulants.  Ambulatory dysfunction Seen by PT, no home needs  June 25, 2018  Patient is here today for follow-up.  She is awake, alert, but oriented only to person.  She is unable to answer questions and defers answering most questions to her husband.  Since discharge from the hospital, the husband denies any further seizure activity.  He denies any further syncopal spells.  He denies any further falls.  He states that they are taking the Keppra and the Florinef.  However no one has checked her orthostatic vitals since discharge from the hospital.  Patient denies feeling dizzy with standing.  She denies feeling lightheaded or passing out with standing.  The patient's timed stand up and go test is 1-1/2 seconds.  She is able to walk with me down the hallway albeit slowly normally.  She is not  dragging her feet.  She is not staggering or stumbling.  She does have decreased arm swing.  She denies any headaches or blurry vision.  He is also taking Aricept for dementia which was started in the hospital.  She is not receiving any physical therapy.  Husband states that she received physical therapy after her previous 2 hospitalizations however they saw very little benefit and therefore did not schedule a third round of physical therapy.  He states that she is eating normally.  She is not wearing compression hose today.  Husband states that they are not wearing compression hose at home.  I discussed the need for compression hose to help prevent orthostatic hypotension and help prevent falls due to autonomic dysregulation.  Husband has not made an appointment to follow-up with neurology regarding her seizures. Past Medical History:  Diagnosis Date  . Anxiety   . Arthritis    ?OA vs Rheum (established with Dr. Jefm Bryant pending blood work)  . Articular cartilage disorder of shoulder 04/2012   right  . Back pain    h/o DDD since 2010  . Dementia ([place])   . Dental crowns present    also dental caps  . Diabetes mellitus    NIDDM  . Fecal incontinence   . GERD (gastroesophageal reflux disease)   . Headache(784.0)    occasional  . History of skin cancer   . Hyperlipidemia    no current med.  . Hypertension   . Hyperthyroidism   . Hypothyroid   . Impingement syndrome  of right shoulder 04/2012  . Memory impairment    dementia- worsenign 12/2016 with medication non compliance and delusions.    . Right rotator cuff tear 05/15/2012  . Rotator cuff rupture 04/2012   right  . Von Willebrand disease ([place])    Past Surgical History:  Procedure Laterality Date  . APPENDECTOMY    . BREAST SURGERY     hematoma evacuation due to trauma  . CARDIAC CATHETERIZATION  03/06/2001  . CESAREAN SECTION     x 2  . COLONOSCOPY  01/20/2006 per patient  . FRACTURE SURGERY    . KNEE ARTHROSCOPY Right  12/01/2014   Procedure: ARTHROSCOPY KNEE,partial medial menisectomy and plica excision;  Surgeon: Hessie Knows, MD;  Location: [facility] OR[facility];  Service: Orthopedics;  Laterality: Right;  . LUMBAR LAMINECTOMY/DECOMPRESSION MICRODISCECTOMY  03/01/2009   right L4-5; arthrodesis L4-5  . ORIF WRIST FRACTURE     left  . SHOULDER ARTHROSCOPY WITH ROTATOR CUFF REPAIR AND SUBACROMIAL DECOMPRESSION  05/15/2012   Procedure: SHOULDER ARTHROSCOPY WITH ROTATOR CUFF REPAIR AND SUBACROMIAL DECOMPRESSION;  Surgeon: Johnny Bridge, MD;  Location: [facility];  Service: Orthopedics;  Laterality: Right;  RIGHT ARTHROSCOPY SHOULDER DEBRIDEMENT LIMITED, ARTHROSCOPY SHOULDER DECOMPRESSION SUBACROMIAL PARTIAL ACROMIOPLASTY WITH CORACOACROMIAL RELEASE, ARTHROSCOPY SHOULDER WITH ROTATOR CUFF REPAIR  . TUBAL LIGATION     Current Outpatient Medications on File Prior to Visit  Medication Sig Dispense Refill  . donepezil (ARICEPT) 5 MG tablet Take 1 tablet (5 mg total) by mouth at bedtime. 30 tablet 0  . fludrocortisone (FLORINEF) 0.1 MG tablet Take 1 tablet (0.1 mg total) by mouth daily. 30 tablet 0  . levETIRAcetam (KEPPRA) 500 MG tablet Take 1 tablet (500 mg total) by mouth 2 (two) times daily. 60 tablet 0  . levothyroxine (SYNTHROID, LEVOTHROID) 88 MCG tablet TAKE 1 TABLET BY MOUTH ONCE A DAY 30 tablet 3  . nystatin (NYSTATIN) powder Apply topically daily as needed (under belly rash).    . nystatin cream (MYCOSTATIN) Apply 1 application topically daily as needed (under belly rash).    Marland Kitchen omeprazole (PRILOSEC) 40 MG capsule TAKE 1 CAPSULE BY MOUTH ONCE DAILY 90 capsule 3  . OVER THE COUNTER MEDICATION Take 2 tablets by mouth 2 (two) times daily as needed (pain). "Back and Body" over the counter    . sertraline (ZOLOFT) 50 MG tablet TAKE 1 TABLET BY MOUTH ONCE A DAY 30 tablet 2   No current facility-administered medications on file prior to visit.    Allergies  Allergen Reactions  . Tramadol Other (See Comments)      CHANGES IN MEMORY   Social History   Socioeconomic History  . Marital status: Married    Spouse name: Trilby Drummer  . Number of children: 2  . Years of education: 52  . Highest education level: Not on file  Occupational History    Comment: retired  Scientific laboratory technician  . Financial resource strain: Not on file  . Food insecurity:    Worry: Not on file    Inability: Not on file  . Transportation needs:    Medical: Not on file    Non-medical: Not on file  Tobacco Use  . Smoking status: Never Smoker  . Smokeless tobacco: Never Used  Substance and Sexual Activity  . Alcohol use: No    Alcohol/week: 0.0 standard drinks  . Drug use: No  . Sexual activity: Not Currently    Birth control/protection: Surgical  Lifestyle  . Physical activity:    Days per  week: Not on file    Minutes per session: Not on file  . Stress: Not on file  Relationships  . Social connections:    Talks on phone: Not on file    Gets together: Not on file    Attends religious service: Not on file    Active member of club or organization: Not on file    Attends meetings of clubs or organizations: Not on file    Relationship status: Not on file  . Intimate partner violence:    Fear of current or ex partner: Not on file    Emotionally abused: Not on file    Physically abused: Not on file    Forced sexual activity: Not on file  Other Topics Concern  . Not on file  Social History Narrative   Patient lives at home with her husband Trilby Drummer).   Patient is retired .   Education high school.   Right handed.   Caffeine sweet tea two cups.         Review of Systems  All other systems reviewed and are negative.      Objective:   Physical Exam Constitutional:      General: She is not in acute distress.    Appearance: Normal appearance. She is not ill-appearing, toxic-appearing or diaphoretic.  HENT:     Head: Normocephalic and atraumatic.     Nose: Nose normal.     Mouth/Throat:     Mouth: Mucous  membranes are moist.     Pharynx: No oropharyngeal exudate or posterior oropharyngeal erythema.  Eyes:     Extraocular Movements: Extraocular movements intact.     Conjunctiva/sclera: Conjunctivae normal.     Pupils: Pupils are equal, round, and reactive to light.  Cardiovascular:     Rate and Rhythm: Normal rate and regular rhythm.     Pulses: Normal pulses.     Heart sounds: Normal heart sounds.  Pulmonary:     Effort: Pulmonary effort is normal. No respiratory distress.     Breath sounds: Normal breath sounds. No wheezing, rhonchi or rales.  Musculoskeletal:     Right lower leg: No edema.     Left lower leg: No edema.  Lymphadenopathy:     Cervical: No cervical adenopathy.  Skin:    General: Skin is warm.     Coloration: Skin is not jaundiced.     Findings: No rash.  Neurological:     General: No focal deficit present.     Mental Status: She is alert. Mental status is at baseline. She is disoriented.     Cranial Nerves: No cranial nerve deficit.     Coordination: Coordination normal.     Gait: Gait normal.   Laying down, the patient's blood pressure is 136/78 with a heart rate of 71.  Sitting up her blood pressure is 110/60 with a heart rate of 72.  Standing her blood pressure is 112/62 with a heart rate of 75.  There was a substantial drop in her systolic blood pressure of 26 points from lying to sitting.  This was asymptomatic        Assessment & Plan:  Hypothyroidism, unspecified type - Plan: TSH  Controlled type 2 diabetes mellitus with complication, without long-term current use of insulin (HCC) - Plan: Hemoglobin A1c, CBC with Differential/Platelet, COMPLETE METABOLIC PANEL WITH GFR  Essential hypertension  Seizures (HCC)  Subarachnoid hemorrhage (HCC)  Syncope, unspecified syncope type  Memory impairment  There was a drop in her systolic  blood pressure rising from a lying to seated position however she is asymptomatic.  Her blood pressure is reasonable at  the present time.  Therefore I will continue the patient on Florinef.  I gave the husband a prescription for compression hose and I encouraged him to have her wear these on a daily basis.  If the patient starts experiencing more orthostatic dizziness, we can add Midodrine to her Florinef.  Patient has been seizure-free since discharge from the hospital on Keppra 500 mg p.o. twice daily.  I will schedule a follow-up with neurology since this has not been done yet.  She has had no further syncopal episodes.  While checking lab work, I will monitor her hemoglobin A1c.  This is currently being managed with diet alone.  I will also check a TSH to monitor her hypothyroidism.  Continue her on donepezil for dementia.  Recommended rechecking in 6 months or as needed.

## 2018-06-26 LAB — CBC WITH DIFFERENTIAL/PLATELET
Absolute Monocytes: 455 cells/uL (ref 200–950)
Basophils Absolute: 42 cells/uL (ref 0–200)
Basophils Relative: 0.6 %
Eosinophils Absolute: 98 cells/uL (ref 15–500)
Eosinophils Relative: 1.4 %
HCT: 37.1 % (ref 35.0–45.0)
Hemoglobin: 12.4 g/dL (ref 11.7–15.5)
Lymphs Abs: 1792 cells/uL (ref 850–3900)
MCH: 27.4 pg (ref 27.0–33.0)
MCHC: 33.4 g/dL (ref 32.0–36.0)
MCV: 81.9 fL (ref 80.0–100.0)
MPV: 11.1 fL (ref 7.5–12.5)
Monocytes Relative: 6.5 %
Neutro Abs: 4613 cells/uL (ref 1500–7800)
Neutrophils Relative %: 65.9 %
Platelets: 282 10*3/uL (ref 140–400)
RBC: 4.53 10*6/uL (ref 3.80–5.10)
RDW: 14.4 % (ref 11.0–15.0)
Total Lymphocyte: 25.6 %
WBC: 7 10*3/uL (ref 3.8–10.8)

## 2018-06-26 LAB — COMPLETE METABOLIC PANEL WITH GFR
AG Ratio: 1.7 (calc) (ref 1.0–2.5)
ALT: 12 U/L (ref 6–29)
AST: 18 U/L (ref 10–35)
Albumin: 4.3 g/dL (ref 3.6–5.1)
Alkaline phosphatase (APISO): 89 U/L (ref 33–130)
BUN: 19 mg/dL (ref 7–25)
CO2: 26 mmol/L (ref 20–32)
Calcium: 9.5 mg/dL (ref 8.6–10.4)
Chloride: 106 mmol/L (ref 98–110)
Creat: 0.92 mg/dL (ref 0.60–0.93)
GFR, Est African American: 73 mL/min/{1.73_m2} (ref >=60)
GFR, Est Non African American: 63 mL/min/{1.73_m2} (ref >=60)
Globulin: 2.5 g/dL (calc) (ref 1.9–3.7)
Glucose, Bld: 80 mg/dL (ref 65–99)
Potassium: 4.2 mmol/L (ref 3.5–5.3)
Sodium: 144 mmol/L (ref 135–146)
Total Bilirubin: 0.5 mg/dL (ref 0.2–1.2)
Total Protein: 6.8 g/dL (ref 6.1–8.1)

## 2018-06-26 LAB — HEMOGLOBIN A1C
Hgb A1c MFr Bld: 6 % of total Hgb — ABNORMAL HIGH (ref <5.7)
Mean Plasma Glucose: 126 (calc)
eAG (mmol/L): 7 (calc)

## 2018-06-26 LAB — TSH: TSH: 4.17 mIU/L (ref 0.40–4.50)

## 2018-07-07 ENCOUNTER — Other Ambulatory Visit: Payer: Self-pay | Admitting: Family Medicine

## 2018-07-07 DIAGNOSIS — F320 Major depressive disorder, single episode, mild: Secondary | ICD-10-CM

## 2018-07-14 ENCOUNTER — Other Ambulatory Visit: Payer: Self-pay | Admitting: Pharmacist

## 2018-07-14 NOTE — Patient Outreach (Signed)
[facility]) Care Management  07/14/2018  Stacy Davis 23-Nov-1945 [id number]   Referral Reason: Post Discharge Medication Review Referral Source: [employer]  Contacted patient's husband and DPR, Stacy Davis, to review medications s/p 06/14/2018 discharge. Was unable to leave message on phone line.   Plan - Will f/u in 1-3 business days for outreach attempt #2  [place], PharmD, [place] PGY2 Ambulatory Care Pharmacy Resident, [facility] Phone: [phone]

## 2018-07-15 ENCOUNTER — Other Ambulatory Visit: Payer: Self-pay | Admitting: Pharmacist

## 2018-07-15 NOTE — Patient Outreach (Signed)
[facility]) Care Management  07/15/2018  Stacy Davis 02/09/1946 [id number]   Referral Reason: Post Discharge Medication Review Referral Source: [employer]  Contacted home number to speak with patient's husband and DPR, Stacy Davis. Phone line continued to ring, was unable to leave a message. No other phone numbers are available in the patient's chart. Will close case d/t inability to make contact.   Catie Darnelle Maffucci, PharmD, [place] PGY2 Ambulatory Care Pharmacy Resident, [facility] Phone: [phone]

## 2018-07-21 ENCOUNTER — Other Ambulatory Visit: Payer: Self-pay | Admitting: Family Medicine

## 2018-07-21 ENCOUNTER — Telehealth: Payer: Self-pay | Admitting: Family Medicine

## 2018-07-21 MED ORDER — FLUDROCORTISONE ACETATE 0.1 MG PO TABS: 0.1000 mg | ORAL_TABLET | Freq: Every day | ORAL | 1 refills | Status: DC

## 2018-07-21 MED ORDER — DONEPEZIL HCL 5 MG PO TABS: 5.0000 mg | ORAL_TABLET | Freq: Every day | ORAL | 5 refills | Status: DC

## 2018-07-21 MED ORDER — LEVETIRACETAM 500 MG PO TABS: 500.0000 mg | ORAL_TABLET | Freq: Two times a day (BID) | ORAL | 5 refills | Status: DC

## 2018-07-21 NOTE — Telephone Encounter (Signed)
Pt needs refill on aricept, florinef, and keppra to [facility].  Pt has not gotten in with a neurologist yet.

## 2018-07-22 NOTE — Telephone Encounter (Signed)
Medication called/sent to requested pharmacy on 07/21/2018

## 2018-07-29 ENCOUNTER — Ambulatory Visit: Payer: PPO | Admitting: Neurology

## 2018-07-29 ENCOUNTER — Encounter: Payer: Self-pay | Admitting: Neurology

## 2018-07-29 VITALS — BP 151/85 | HR 58 | Ht 63.0 in | Wt 153.0 lb

## 2018-07-29 DIAGNOSIS — R569 Unspecified convulsions: Secondary | ICD-10-CM | POA: Diagnosis not present

## 2018-07-29 DIAGNOSIS — F0390 Unspecified dementia without behavioral disturbance: Secondary | ICD-10-CM

## 2018-07-29 MED ORDER — LEVETIRACETAM 500 MG PO TABS: 500.0000 mg | ORAL_TABLET | Freq: Two times a day (BID) | ORAL | 4 refills | Status: DC

## 2018-07-29 MED ORDER — DONEPEZIL HCL 5 MG PO TABS: 5.0000 mg | ORAL_TABLET | Freq: Every day | ORAL | 4 refills | Status: DC

## 2018-07-29 NOTE — Progress Notes (Signed)
PATIENT: Stacy Davis DOB: 03/27/1946  Chief Complaint  Patient presents with  . Dementia    MMSE 15/30 - 2 animals.  She is here with her husband, Trilby Drummer.  Feels her memory has continued to decline.  . Seizures    She was seen in the ED on 06/14/2018 for seizure-like activity.  No further events since being started on Keppra 500mg , one tablet twice daily.  Marland Kitchen Headache    She has frequent headaches, nearly every other day.  She uses Tylenol for pain relief.   Marland Kitchen PCP    Susy Frizzle, MD     HISTORICAL  Stacy Davis is a 73 years old female, seen in request by her primary care physician Dr. Jenna Luo for evaluation of worsening dementia, seizure, She is with her husband Trilby Drummer at today's visit on July 29, 2018.  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  I reviewed hospital discharge on June 16, 2018, she was admitted for collapse, similar presentation in August 2019, according to husband, total of 3 episodes, sudden loss of consciousness, generalized tonic-clonic seizure, suffered a fall, first episode in early 2019, was walking around the bed  suddenly collapsed with drooling from the mouth jerking movement in all extremities, no bowel and bladder incontinence, second episode in August, collapsed to the kitchen floor, husband found her jerking, extremity movement, third episode 2 AM on June 15, 2018, was found on the edge of the bed, with seizure activities.  patient appeared to have breathing difficulty, required bagging from paramedic, confused, then she was able to back to her baseline, she had no recollection of the fall,    EEG is consistent with an area of potential epileptogenicity in the right frontotemporal region. There was no seizure recorded on this study.   She was started on Keppra 500 mg twice daily, tolerating it well,  I personally reviewed MRI of the brain without contrast June 14, 2018: No acute abnormality, moderate supratentorium small vessel disease.   Laboratory evaluations January 2020 showed TSH, CMP, CBC, A1c was 6.0,  REVIEW OF SYSTEMS: Full 14 system review of systems performed and notable only for as above All other review of systems were negative.  ALLERGIES: Allergies  Allergen Reactions  . Tramadol Other (See Comments)    CHANGES IN MEMORY    HOME MEDICATIONS: Current Outpatient Medications  Medication Sig Dispense Refill  . donepezil (ARICEPT) 5 MG tablet Take 1 tablet (5 mg total) by mouth at bedtime. 30 tablet 5  . fludrocortisone (FLORINEF) 0.1 MG tablet Take 1 tablet (0.1 mg total) by mouth daily. 30 tablet 1  . levETIRAcetam (KEPPRA) 500 MG tablet Take 1 tablet (500 mg total) by  mouth 2 (two) times daily. 60 tablet 5  . levothyroxine (SYNTHROID, LEVOTHROID) 88 MCG tablet TAKE 1 TABLET BY MOUTH ONCE A DAY 30 tablet 3  . nystatin (NYSTATIN) powder Apply topically daily as needed (under belly rash).    . nystatin cream (MYCOSTATIN) Apply 1 application topically daily as needed (under belly rash).    Marland Kitchen omeprazole (PRILOSEC) 40 MG capsule TAKE 1 CAPSULE BY MOUTH ONCE DAILY 90 capsule 3    . OVER THE COUNTER MEDICATION Take 2 tablets by mouth 2 (two) times daily as needed (pain). "Back and Body" over the counter    . sertraline (ZOLOFT) 50 MG tablet TAKE 1 TABLET BY MOUTH ONCE DAILY 30 tablet 2   No current facility-administered medications for this visit.     PAST MEDICAL HISTORY: Past Medical History:  Diagnosis Date  . Anxiety   . Arthritis    ?OA vs Rheum (established with Dr. Jefm Bryant pending blood work)  . Articular cartilage disorder of shoulder 04/2012   right  . Back pain    h/o DDD since 2010  . Dementia ([place])   . Dementia ([place])   . Dental crowns present    also dental caps  . Diabetes mellitus    NIDDM  . Fecal incontinence   . GERD (gastroesophageal reflux disease)   . Headache(784.0)    occasional  . History of skin cancer   . Hyperlipidemia    no current med.  . Hypertension   . Hyperthyroidism   . Hypothyroid   . Impingement syndrome of right shoulder 04/2012  . Memory impairment    dementia- worsenign 12/2016 with medication non compliance and delusions.    . Orthostatic dizziness   . Right rotator cuff tear 05/15/2012  . Rotator cuff rupture 04/2012   right  . Seizures ([place])   . Von Willebrand disease ([place])     PAST SURGICAL HISTORY: Past Surgical History:  Procedure Laterality Date  . APPENDECTOMY    . BREAST SURGERY     hematoma evacuation due to trauma  . CARDIAC CATHETERIZATION  03/06/2001  . CESAREAN SECTION     x 2  . COLONOSCOPY  01/20/2006 per patient  . FRACTURE SURGERY    . KNEE ARTHROSCOPY Right 12/01/2014   Procedure: ARTHROSCOPY KNEE,partial medial menisectomy and plica excision;  Surgeon: Hessie Knows, MD;  Location: [facility];  Service: Orthopedics;  Laterality: Right;  . LUMBAR LAMINECTOMY/DECOMPRESSION MICRODISCECTOMY  03/01/2009   right L4-5; arthrodesis L4-5  . ORIF WRIST FRACTURE     left  . SHOULDER ARTHROSCOPY WITH ROTATOR CUFF REPAIR AND SUBACROMIAL DECOMPRESSION  05/15/2012   Procedure: SHOULDER  ARTHROSCOPY WITH ROTATOR CUFF REPAIR AND SUBACROMIAL DECOMPRESSION;  Surgeon: Johnny Bridge, MD;  Location: [facility];  Service: Orthopedics;  Laterality: Right;  RIGHT ARTHROSCOPY SHOULDER DEBRIDEMENT LIMITED, ARTHROSCOPY SHOULDER DECOMPRESSION SUBACROMIAL PARTIAL ACROMIOPLASTY WITH CORACOACROMIAL RELEASE, ARTHROSCOPY SHOULDER WITH ROTATOR CUFF REPAIR  . TUBAL LIGATION      FAMILY HISTORY: Family History  Problem Relation Age of Onset  . Dementia Mother   . Arthritis Mother   . Cancer Father        unknown primary  . Heart disease Father   . Thyroid disease Sister   . Cancer Sister        brain tumor  . Dementia Sister   . COPD Brother   . Cancer Brother        bone cancer  . Colon cancer Paternal Grandfather   . Breast cancer  Neg Hx     SOCIAL HISTORY: Social History   Socioeconomic History  . Marital status: Married    Spouse name: Trilby Drummer  . Number of children: 2  . Years of education: 1  . Highest education level: Not on file  Occupational History  . Occupation: Retired    Comment: retired  Scientific laboratory technician  . Financial resource strain: Not on file  . Food insecurity:    Worry: Not on file    Inability: Not on file  . Transportation needs:    Medical: Not on file    Non-medical: Not on file  Tobacco Use  . Smoking status: Never Smoker  . Smokeless tobacco: Never Used  Substance and Sexual Activity  . Alcohol use: No    Alcohol/week: 0.0 standard drinks  . Drug use: No  . Sexual activity: Not Currently    Birth control/protection: Surgical  Lifestyle  . Physical activity:    Days per week: Not on file    Minutes per session: Not on file  . Stress: Not on file  Relationships  . Social connections:    Talks on phone: Not on file    Gets together: Not on file    Attends religious service: Not on file    Active member of club or organization: Not on file    Attends meetings of clubs or organizations: Not on file    Relationship status: Not  on file  . Intimate partner violence:    Fear of current or ex partner: Not on file    Emotionally abused: Not on file    Physically abused: Not on file    Forced sexual activity: Not on file  Other Topics Concern  . Not on file  Social History Narrative   Patient lives at home with her husband Trilby Drummer).   Patient is retired .   Education high school.   Right handed.   Caffeine sweet tea two cups.        PHYSICAL EXAM   Vitals:   07/29/18 0905  BP: (!) 151/85  Pulse: (!) 58  Weight: 153 lb (69.4 kg)  Height: 5\' 3"  (1.6 m)    Not recorded      Body mass index is 27.1 kg/m.  PHYSICAL EXAMNIATION:  Gen: NAD, conversant, well nourised, obese, well groomed                     Cardiovascular: Regular rate rhythm, no peripheral edema, warm, nontender. Eyes: Conjunctivae clear without exudates or hemorrhage Neck: Supple, no carotid bruits. Pulmonary: Clear to auscultation bilaterally   NEUROLOGICAL EXAM: MMSE - Mini Mental State Exam 07/29/2018 03/07/2015 12/05/2014  Orientation to time 1 3 3   Orientation to Place 3 4 4   Registration 3 3 3   Attention/ Calculation 0 5 5  Recall 0 1 0  Language- name 2 objects 2 2 2   Language- repeat 1 1 1   Language- follow 3 step command 3 3 3   Language- read & follow direction 1 1 1   Write a sentence 1 1 1   Copy design 0 1 1  Total score 15 25 24   Animal naming 2 CRANIAL NERVES: CN II: Visual fields are full to confrontation.  Pupils are round equal and briskly reactive to light. CN III, IV, VI: extraocular movement are normal. No ptosis. CN V: Facial sensation is intact to pinprick in all 3 divisions bilaterally. Corneal responses are intact.  CN VII: Face is symmetric with normal eye closure  and smile. CN VIII: Hearing is normal to rubbing fingers CN IX, X: Palate elevates symmetrically. Phonation is normal. CN XI: Head turning and shoulder shrug are intact CN XII: Tongue is midline with normal movements and no  atrophy.  MOTOR: There is no pronator drift of out-stretched arms. Muscle bulk and tone are normal. Muscle strength is normal.  REFLEXES: Reflexes are hypoactive and symmetric at the biceps, triceps, knees, and ankles. Plantar responses are flexor.  SENSORY: Intact to light touch, pinprick  COORDINATION: Rapid alternating movements and fine finger movements are intact. There is no dysmetria on finger-to-nose and heel-knee-shin.    GAIT/STANCE: Pushed up to get up from seated position, cautious   DIAGNOSTIC DATA (LABS, IMAGING, TESTING) - I reviewed patient records, labs, notes, testing and imaging myself where available.   ASSESSMENT AND PLAN  AURIA MCKINLAY is a 73 y.o. female   Dementia  Most likely Alzheimer's dementia    Strong family history of dementia  Continue Aricept 5 mg at bedtime Seizure  Continue Keppra 500 mg twice a day       Marcial Pacas, M.D. Ph.D.  [facility] [address] [place], [place] [address] Ph: [phone] Fax: [phone]  CC: Susy Frizzle, MD

## 2018-09-23 ENCOUNTER — Other Ambulatory Visit: Payer: Self-pay | Admitting: Family Medicine

## 2018-09-23 DIAGNOSIS — F320 Major depressive disorder, single episode, mild: Secondary | ICD-10-CM

## 2018-10-05 ENCOUNTER — Other Ambulatory Visit: Payer: Self-pay | Admitting: Family Medicine

## 2018-10-08 ENCOUNTER — Other Ambulatory Visit: Payer: Self-pay | Admitting: *Deleted

## 2018-10-08 MED ORDER — NYSTATIN 100000 UNIT/GM EX CREA: 1.0000 "application " | TOPICAL_CREAM | Freq: Every day | CUTANEOUS | 1 refills | Status: DC | PRN

## 2018-10-08 MED ORDER — NYSTATIN 100000 UNIT/GM EX POWD: Freq: Every day | CUTANEOUS | 3 refills | Status: DC | PRN

## 2018-11-30 ENCOUNTER — Other Ambulatory Visit: Payer: Self-pay

## 2018-11-30 ENCOUNTER — Ambulatory Visit (INDEPENDENT_AMBULATORY_CARE_PROVIDER_SITE_OTHER): Payer: PPO | Admitting: Neurology

## 2018-11-30 ENCOUNTER — Encounter: Payer: Self-pay | Admitting: Neurology

## 2018-11-30 ENCOUNTER — Telehealth: Payer: Self-pay | Admitting: Neurology

## 2018-11-30 VITALS — BP 118/84 | HR 42 | Temp 98.2°F | Ht 63.0 in | Wt 139.4 lb

## 2018-11-30 DIAGNOSIS — F0390 Unspecified dementia without behavioral disturbance: Secondary | ICD-10-CM | POA: Diagnosis not present

## 2018-11-30 DIAGNOSIS — R569 Unspecified convulsions: Secondary | ICD-10-CM

## 2018-11-30 NOTE — Progress Notes (Signed)
PATIENT: Stacy Davis DOB: Sep 25, 1945  REASON FOR VISIT: follow up HISTORY FROM: patient  HISTORY OF PRESENT ILLNESS: Today 11/30/18  HISTORY Stacy Davis is a 73 years old female, seen in request by her primary care physician Dr. Jenna Luo for evaluation of worsening dementia, seizure, She is with her husband Trilby Drummer at today's visit on July 29, 2018.  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  I reviewed hospital discharge on June 16, 2018, she was admitted for collapse, similar presentation in August 2019, according to husband, total of 3 episodes, sudden loss of consciousness, generalized tonic-clonic seizure, suffered a fall, first episode in early 2019, was walking around the bed suddenly collapsed with drooling from the mouth jerking movement in all extremities, no bowel and bladder incontinence, second episode in August, collapsed to the kitchen floor, husband found her jerking, extremity movement, third episode 2 AM on June 15, 2018, was found on the edge of the bed, with seizure activities.  patient appeared to have breathing difficulty,  required bagging from paramedic, confused, then she was able to back to her baseline, she had no recollection of the fall,    EEG is consistent with an area of potential epileptogenicity in the right frontotemporal region. There was no seizure recorded on this study.   She was started on Keppra 500 mg twice daily, tolerating it well,  I personally reviewed MRI of the brain without contrast June 14, 2018: No acute abnormality, moderate supratentorium small vessel disease.   Laboratory evaluations January 2020 showed TSH, CMP, CBC, A1c was 6.0,  Update November 30, 2018 SS: Last MMSE 15/30.  She presents today with her husband.  He indicates her memory has steadily been getting worse.  She is able to perform some of her own ADLs, give herself a bath.  Her husband helps with dressing.  They report she has a good appetite, but they only eat twice a day.  She sleeps well at night.  She is able to do some cooking.  She continues to have difficulty with her balance.  She has not fallen.  They deny any weight loss.  She has not had recurrent seizure.  Her husband manages her medications.  She is very cold today, she gets cold everywhere when they go out, they take a blanket with them.  If she gets cold makes her agitated.  REVIEW OF SYSTEMS: Out of a complete 14 system review of symptoms, the patient complains only of the following symptoms, and all other reviewed systems are negative.  Memory loss  ALLERGIES: Allergies  Allergen Reactions   Tramadol Other (See Comments)    CHANGES IN MEMORY    HOME MEDICATIONS: Outpatient Medications  Prior to Visit  Medication Sig Dispense Refill   donepezil (ARICEPT) 5 MG tablet Take 1 tablet (5 mg total) by mouth at bedtime. 90 tablet 4   levETIRAcetam (KEPPRA) 500 MG tablet Take 1 tablet (500 mg total) by mouth 2 (two) times daily. 180 tablet 4   levothyroxine (SYNTHROID) 88 MCG tablet TAKE 1 TABLET BY MOUTH ONCE A DAY 30 tablet 3   nystatin (NYSTATIN)  powder Apply topically daily as needed (under belly rash). 60 g 3   nystatin cream (MYCOSTATIN) Apply 1 application topically daily as needed (under belly rash). 30 g 1   omeprazole (PRILOSEC) 40 MG capsule TAKE 1 CAPSULE BY MOUTH ONCE DAILY 90 capsule 3   OVER THE COUNTER MEDICATION Take 2 tablets by mouth 2 (two) times daily as needed (pain). "Back and Body" over the counter     sertraline (ZOLOFT) 50 MG tablet TAKE 1 TABLET BY MOUTH ONCE DAILY 30 tablet 2   fludrocortisone (FLORINEF) 0.1 MG tablet Take 1 tablet (0.1 mg total) by mouth daily. 30 tablet 1   No facility-administered medications prior to visit.     PAST MEDICAL HISTORY: Past Medical History:  Diagnosis Date   Anxiety    Arthritis    ?OA vs Rheum (established with Dr. Jefm Bryant pending blood work)   Articular cartilage disorder of shoulder 04/2012   right   Back pain    h/o DDD since 2010   Basal cell carcinoma    right dorsum nose, right forhead   Dementia ([place])    Dementia ([place])    Dental crowns present    also dental caps   Diabetes mellitus    NIDDM   Fecal incontinence    GERD (gastroesophageal reflux disease)    Headache(784.0)    occasional   History of skin cancer    Hyperlipidemia    no current med.   Hypertension    Hyperthyroidism    Hypothyroid    Impingement syndrome of right shoulder 04/2012   Memory impairment    dementia- worsenign 12/2016 with medication non compliance and delusions.     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   left medial ceek   Von Willebrand disease ([place])     PAST SURGICAL HISTORY: Past Surgical History:  Procedure Laterality Date   APPENDECTOMY     BREAST SURGERY     hematoma evacuation due to trauma   CARDIAC CATHETERIZATION  03/06/2001   CESAREAN SECTION     x 2   COLONOSCOPY  01/20/2006 per patient     FRACTURE SURGERY     KNEE ARTHROSCOPY Right 12/01/2014   Procedure: ARTHROSCOPY KNEE,partial medial menisectomy and plica excision;  Surgeon: Hessie Knows, MD;  Location: [facility];  Service: Orthopedics;  Laterality: Right;   LUMBAR LAMINECTOMY/DECOMPRESSION MICRODISCECTOMY  03/01/2009   right L4-5; arthrodesis L4-5   ORIF WRIST FRACTURE     left   SHOULDER ARTHROSCOPY WITH ROTATOR CUFF REPAIR AND SUBACROMIAL DECOMPRESSION  05/15/2012   Procedure: SHOULDER ARTHROSCOPY WITH ROTATOR CUFF REPAIR AND SUBACROMIAL DECOMPRESSION;  Surgeon: Johnny Bridge, MD;  Location: [facility];  Service: Orthopedics;  Laterality: Right;  RIGHT ARTHROSCOPY SHOULDER DEBRIDEMENT LIMITED, ARTHROSCOPY SHOULDER DECOMPRESSION SUBACROMIAL PARTIAL ACROMIOPLASTY WITH CORACOACROMIAL RELEASE, ARTHROSCOPY SHOULDER WITH ROTATOR CUFF REPAIR   TUBAL LIGATION      FAMILY HISTORY: Family History  Problem Relation Age of Onset   Dementia Mother    Arthritis Mother    Cancer Father        unknown primary   Heart disease Father    Thyroid disease Sister    Cancer Sister        brain tumor   Dementia Sister    COPD Brother    Cancer Brother        bone cancer   Colon cancer Paternal Grandfather    Breast cancer Neg Hx     SOCIAL HISTORY: Social History   Socioeconomic History   Marital status: Married    Spouse name: Trilby Drummer   Number of children: 2   Years of education: 12   Highest education level: Not on file  Occupational History   Occupation: Retired    Comment: retired  Scientist, product/process development strain: Not on file   Food insecurity    Worry: Not on file    Inability: Not on Lexicographer needs    Medical: Not on file    Non-medical: Not on file  Tobacco Use   Smoking status: Never Smoker   Smokeless tobacco: Never Used  Substance and Sexual Activity   Alcohol use: No    Alcohol/week: 0.0 standard drinks   Drug use: No   Sexual  activity: Not Currently    Birth control/protection: Surgical  Lifestyle   Physical activity    Days per week: Not on file    Minutes per session: Not on file   Stress: Not on file  Relationships   Social connections    Talks on phone: Not on file    Gets together: Not on file    Attends religious service: Not on file    Active member of club or organization: Not on file    Attends meetings of clubs or organizations: Not on file    Relationship status: Not on file   Intimate partner violence    Fear of current or ex partner: Not on file    Emotionally abused: Not on file    Physically abused: Not on file    Forced sexual activity: Not on file  Other Topics Concern   Not on file  Social History Narrative   Patient lives at home with her husband Trilby Drummer).   Patient is retired .   Education high school.   Right handed.   Caffeine sweet tea two cups.         PHYSICAL EXAM  Vitals:   11/30/18 1302  BP: 118/84  Pulse: (!) 42  Temp: 98.2 F (36.8 C)  Weight: 139 lb 6.4 oz (63.2 kg)  Height: 5\' 3"  (1.6 m)   Body mass index is 24.69 kg/m.  Generalized: Well developed, in no acute distress, HR 71 on recheck MMSE - Mini Mental State Exam 11/30/2018 07/29/2018 03/07/2015  Orientation to time 0 1 3  Orientation to Place 1 3 4   Registration 3 3 3   Attention/ Calculation 0 0 5  Recall 2 0 1  Language- name 2 objects 1 2 2   Language- repeat 1 1 1   Language- follow 3 step command 1 3 3   Language- read & follow direction 0 1 1  Write a sentence 0 1 1  Copy design 0 0 1  Copy design-comments couldnt name any animals - -  Total score 9 15 25     Neurological examination  Mentation: Alert oriented to time, place, history taking. Follows all  commands speech and language fluent, somewhat agitated, shivering,  Cranial nerve II-XII: Pupils were equal round reactive to light. Extraocular movements were full, visual field were full on confrontational test. Facial sensation and  strength were normal. Uvula tongue midline. Head turning and shoulder shrug  were normal and symmetric. Motor: The motor testing reveals 5 over 5 strength of all 4 extremities. Good symmetric motor tone is noted throughout.  Sensory: Sensory testing is intact to soft touch on all 4 extremities. No evidence of extinction is noted.  Coordination: Cerebellar testing reveals good finger-nose-finger and heel-to-shin bilaterally.  Gait and station: Gait is mildly unsteady Reflexes: Deep tendon reflexes are symmetric and normal bilaterally.   DIAGNOSTIC DATA (LABS, IMAGING, TESTING) - I reviewed patient records, labs, notes, testing and imaging myself where available.  Lab Results  Component Value Date   WBC 7.0 06/25/2018   HGB 12.4 06/25/2018   HCT 37.1 06/25/2018   MCV 81.9 06/25/2018   PLT 282 06/25/2018      Component Value Date/Time   NA 144 06/25/2018 1513   K 4.2 06/25/2018 1513   CL 106 06/25/2018 1513   CO2 26 06/25/2018 1513   GLUCOSE 80 06/25/2018 1513   BUN 19 06/25/2018 1513   CREATININE 0.92 06/25/2018 1513   CALCIUM 9.5 06/25/2018 1513   PROT 6.8 06/25/2018 1513   ALBUMIN 4.3 04/11/2018 1930   AST 18 06/25/2018 1513   ALT 12 06/25/2018 1513   ALKPHOS 85 04/11/2018 1930   BILITOT 0.5 06/25/2018 1513   GFRNONAA 63 06/25/2018 1513   GFRAA 73 06/25/2018 1513   Lab Results  Component Value Date   CHOL 234 (H) 05/18/2015   HDL 57 05/18/2015   LDLCALC 137 (H) 05/18/2015   LDLDIRECT 164.0 07/08/2014   TRIG 199 (H) 05/18/2015   CHOLHDL 4.1 05/18/2015   Lab Results  Component Value Date   HGBA1C 6.0 (H) 06/25/2018   Lab Results  Component Value Date   VITAMINB12 301 10/27/2014   Lab Results  Component Value Date   TSH 4.17 06/25/2018      ASSESSMENT AND PLAN 73 y.o. year old female  has a past medical history of Anxiety, Arthritis, Articular cartilage disorder of shoulder (04/2012), Back pain, Basal cell carcinoma, Dementia ([facility]), Dementia ([place]), Dental  crowns present, Diabetes mellitus, Fecal incontinence, GERD (gastroesophageal reflux disease), Headache(784.0), History of skin cancer, Hyperlipidemia, Hypertension, Hyperthyroidism, Hypothyroid, Impingement syndrome of right shoulder (04/2012), Memory impairment, Orthostatic dizziness, Right rotator cuff tear (05/15/2012), Rotator cuff rupture (04/2012), Seizures ([place]), Squamous cell carcinoma in situ (11/04/2013), and Von Willebrand disease ([place]). here with:  1. Dementia  2. Seizure   At today's visit, she was agitated about being cold.  She was shivering in the room, disagreeable to answering MMSE questions.  She was given blankets, but somewhat agitated at today's visit.  She was ready to leave.  She has not had recurrent seizure, she can continue Keppra 500 mg twice a day.  She remains on Aricept 5 mg daily.  She has not had a recent fall, she continues to report trouble with balance.  According to the history, it appears her MMSE score is higher than 9.  She will follow-up in 3 months for revisit and repeat memory test.  I have suggested she wear warm clothes, bring a blanket for next visit.  Her husband indicates no behavioral issues or agitation at home, except when she gets cold, which was the case today.  I will have the nurse call tomorrow  to ensure she is doing better.  She is not exhibiting any signs of infection, vital signs are stable (HR 71 on recheck).   I spent 15 minutes with the patient. 50% of this time was spent discussing her plan of care.   Butler Denmark, AGNP-C, DNP 11/30/2018, 2:03 PM [facility] [address] [place], [place] [address] [phone]

## 2018-11-30 NOTE — Telephone Encounter (Signed)
I saw this patient today. She got very cold at today's visit and did not want to participate. She didn't want to do the MMSE fully and become agitated that she was cold. Can you call her tomorrow to make sure she is okay. Her husband indicates this is normal when she gets cold, she gets agitated. He says there aren't any behavioral issues otherwise.

## 2018-12-01 NOTE — Telephone Encounter (Signed)
No answer

## 2018-12-01 NOTE — Telephone Encounter (Signed)
Called, no answer.

## 2018-12-02 NOTE — Telephone Encounter (Signed)
No answer

## 2018-12-07 NOTE — Progress Notes (Signed)
I have reviewed and agreed above plan. 

## 2019-02-20 ENCOUNTER — Encounter (HOSPITAL_COMMUNITY): Payer: Self-pay | Admitting: Emergency Medicine

## 2019-02-20 ENCOUNTER — Emergency Department (HOSPITAL_COMMUNITY): Payer: PPO

## 2019-02-20 ENCOUNTER — Other Ambulatory Visit: Payer: Self-pay

## 2019-02-20 ENCOUNTER — Emergency Department (HOSPITAL_COMMUNITY)
Admission: EM | Admit: 2019-02-20 | Discharge: 2019-02-20 | Disposition: A | Discharge status: Home / Self Care | Payer: PPO | Attending: Emergency Medicine | Admitting: Emergency Medicine

## 2019-02-20 DIAGNOSIS — Z85828 Personal history of other malignant neoplasm of skin: Secondary | ICD-10-CM | POA: Diagnosis not present

## 2019-02-20 DIAGNOSIS — I10 Essential (primary) hypertension: Secondary | ICD-10-CM | POA: Diagnosis not present

## 2019-02-20 DIAGNOSIS — R404 Transient alteration of awareness: Secondary | ICD-10-CM | POA: Diagnosis not present

## 2019-02-20 DIAGNOSIS — R000 Tachycardia, unspecified: Secondary | ICD-10-CM | POA: Diagnosis not present

## 2019-02-20 DIAGNOSIS — Y929 Unspecified place or not applicable: Secondary | ICD-10-CM | POA: Insufficient documentation

## 2019-02-20 DIAGNOSIS — E119 Type 2 diabetes mellitus without complications: Secondary | ICD-10-CM | POA: Diagnosis not present

## 2019-02-20 DIAGNOSIS — W19XXXA Unspecified fall, initial encounter: Secondary | ICD-10-CM | POA: Diagnosis not present

## 2019-02-20 DIAGNOSIS — F0390 Unspecified dementia without behavioral disturbance: Secondary | ICD-10-CM | POA: Diagnosis not present

## 2019-02-20 DIAGNOSIS — Y999 Unspecified external cause status: Secondary | ICD-10-CM | POA: Diagnosis not present

## 2019-02-20 DIAGNOSIS — S0083XA Contusion of other part of head, initial encounter: Secondary | ICD-10-CM | POA: Insufficient documentation

## 2019-02-20 DIAGNOSIS — G40909 Epilepsy, unspecified, not intractable, without status epilepticus: Secondary | ICD-10-CM | POA: Diagnosis not present

## 2019-02-20 DIAGNOSIS — Z79899 Other long term (current) drug therapy: Secondary | ICD-10-CM | POA: Insufficient documentation

## 2019-02-20 DIAGNOSIS — Y9389 Activity, other specified: Secondary | ICD-10-CM | POA: Diagnosis not present

## 2019-02-20 DIAGNOSIS — I959 Hypotension, unspecified: Secondary | ICD-10-CM | POA: Diagnosis not present

## 2019-02-20 DIAGNOSIS — E039 Hypothyroidism, unspecified: Secondary | ICD-10-CM | POA: Diagnosis not present

## 2019-02-20 DIAGNOSIS — R51 Headache: Secondary | ICD-10-CM | POA: Diagnosis not present

## 2019-02-20 DIAGNOSIS — R569 Unspecified convulsions: Secondary | ICD-10-CM | POA: Diagnosis not present

## 2019-02-20 DIAGNOSIS — W1839XA Other fall on same level, initial encounter: Secondary | ICD-10-CM | POA: Insufficient documentation

## 2019-02-20 LAB — COMPREHENSIVE METABOLIC PANEL
ALT: 12 U/L (ref 0–44)
AST: 23 U/L (ref 15–41)
Albumin: 4 g/dL (ref 3.5–5.0)
Alkaline Phosphatase: 71 U/L (ref 38–126)
Anion gap: 8 (ref 5–15)
BUN: 15 mg/dL (ref 8–23)
CO2: 26 mmol/L (ref 22–32)
Calcium: 8.9 mg/dL (ref 8.9–10.3)
Chloride: 109 mmol/L (ref 98–111)
Creatinine, Ser: 0.71 mg/dL (ref 0.44–1.00)
GFR calc Af Amer: >60 mL/min (ref >60)
GFR calc non Af Amer: >60 mL/min (ref >60)
Glucose, Bld: 117 mg/dL — ABNORMAL HIGH (ref 70–99)
Potassium: 3.3 mmol/L — ABNORMAL LOW (ref 3.5–5.1)
Sodium: 143 mmol/L (ref 135–145)
Total Bilirubin: 0.5 mg/dL (ref 0.3–1.2)
Total Protein: 6.7 g/dL (ref 6.5–8.1)

## 2019-02-20 LAB — CBC WITH DIFFERENTIAL/PLATELET
Abs Immature Granulocytes: 0.02 10*3/uL (ref 0.00–0.07)
Basophils Absolute: 0 10*3/uL (ref 0.0–0.1)
Basophils Relative: 0 %
Eosinophils Absolute: 0.1 10*3/uL (ref 0.0–0.5)
Eosinophils Relative: 1 %
HCT: 37.8 % (ref 36.0–46.0)
Hemoglobin: 11.9 g/dL — ABNORMAL LOW (ref 12.0–15.0)
Immature Granulocytes: 0 %
Lymphocytes Relative: 20 %
Lymphs Abs: 1.2 10*3/uL (ref 0.7–4.0)
MCH: 27.8 pg (ref 26.0–34.0)
MCHC: 31.5 g/dL (ref 30.0–36.0)
MCV: 88.3 fL (ref 80.0–100.0)
Monocytes Absolute: 0.3 10*3/uL (ref 0.1–1.0)
Monocytes Relative: 5 %
Neutro Abs: 4.6 10*3/uL (ref 1.7–7.7)
Neutrophils Relative %: 74 %
Platelets: 176 10*3/uL (ref 150–400)
RBC: 4.28 MIL/uL (ref 3.87–5.11)
RDW: 13.9 % (ref 11.5–15.5)
WBC: 6.2 10*3/uL (ref 4.0–10.5)
nRBC: 0 % (ref 0.0–0.2)

## 2019-02-20 MED ORDER — LEVETIRACETAM IN NACL 1000 MG/100ML IV SOLN
1000.0000 mg | Freq: Once | INTRAVENOUS | Status: AC
  Administered 2019-02-20: 1000 mg via INTRAVENOUS
  Filled 2019-02-20: qty 100

## 2019-02-20 MED ORDER — SODIUM CHLORIDE 0.9 % IV BOLUS
500.0000 mL | Freq: Once | INTRAVENOUS | Status: AC
  Administered 2019-02-20: 500 mL via INTRAVENOUS

## 2019-02-20 MED ORDER — LEVETIRACETAM 100 MG/ML PO SOLN: 500.0000 mg | Freq: Two times a day (BID) | ORAL | 1 refills | Status: DC

## 2019-02-20 NOTE — Discharge Instructions (Signed)
Patient in the ER for seizure-like activity. We are prescribing Stacy Davis liquid formulation of Keppra.  We recommend that you mix the medication into food twice a day so that she can get appropriate levels of Keppra in her.  Levothyroxine is also available in liquid formulation. Please call Stacy Davis's primary care doctor to see if they can change her medications to liquid formulation.
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
## 2019-03-01 NOTE — Progress Notes (Signed)
PATIENT: Stacy Davis DOB: May 22, 1946  REASON FOR VISIT: follow up HISTORY FROM: patient  HISTORY OF PRESENT ILLNESS: Today 03/02/19  HISTORY  Today 11/30/18  HISTORY Stacy Davis a 73 years old female, seen in request byher primary care physician Dr.Pickard, [place] evaluation[place] worsening dementia, seizure, She is with her husband Trilby Drummer at today's visit on July 29, 2018.  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  I reviewed hospital discharge on June 16, 2018,shewas admitted for collapse, similar presentation in August 2019, according to husband, total of 3 episodes, sudden loss of consciousness,generalized tonic-clonic seizure,suffered a fall, first episode in early 2019, was walking around the bed suddenly collapsed with drooling from the mouth jerking movement in all extremities, no bowel and bladder incontinence, second episode in August, collapsed to the kitchen floor, husband found her jerking, extremity movement, third episode 2 AM on June 15, 2018, was found on the edge of the bed, with seizure activities.patient appearedto have  breathing difficulty, required bagging from paramedic, confused, then she was able to back to her baseline, she had no recollection of the fall,   EEG is consistent with an area of potential epileptogenicity in the right frontotemporal region. There was no seizure recorded on this study.  She was started on Keppra 500 mg twice daily, tolerating it well,  I personally reviewed MRI of the brain without contrast June 14, 2018: No acute abnormality, moderate supratentorium small vessel disease.  Laboratory evaluations January 2020 showed TSH, CMP, CBC, A1c was 6.0,  Update November 30, 2018 SS: Last MMSE 15/30.  She presents today with her husband.  He indicates her memory has steadily been getting worse.  She is able to perform some of her own ADLs, give herself a bath.  Her husband helps with dressing.  They report she has a good appetite, but they only eat twice a day.  She sleeps well at night.  She is able to do some cooking.  She continues to have difficulty with her balance.  She has not fallen.  They deny any weight loss.  She has not had recurrent seizure.  Her husband manages her medications.  She is very cold today, she gets cold everywhere when they go out, they take a blanket with them.  If she gets cold makes her agitated.  Update March 02, 2019 SS: She was seen in the ER 02/20/2019 for seizure-like episode, witnessed by her husband.  He describes that she fell down and was frothing and shaking all over.  She had not been taking her medications as prescribed, she was refusing to take her pills at the time.  She was switched to liquid Keppra and provided home health.   Currently, she  has been agreeable to taking her pills consistently.  She is taking Keppra 500 mg twice a day, did not start liquid Keppra.  She has periods of agitation with her husband, good and bad days.  She does not sleep well at night, she is often up wandering all night, at times is agitated. She gets upset with her  husband and gets mad.  Her appetite is fair.  Her weight has stayed the same since last visit. Her last fall occurred in September at the time of the seizure.   CT scan of the brain 02/20/2019 showed mild chronic ischemic white matter disease, no acute intracranial abnormality.  Laboratory evaluation showed potassium 3.3, glucose 117, hemoglobin 11.9  REVIEW OF SYSTEMS: Out of a complete 14 system review of symptoms, the patient complains only of the following symptoms, and all other reviewed systems are negative.  Memory loss, seizures  ALLERGIES: Allergies  Allergen Reactions  . Tramadol Other (See Comments)    CHANGES IN MEMORY    HOME MEDICATIONS: Outpatient Medications Prior to Visit  Medication Sig Dispense Refill  . donepezil (ARICEPT) 5 MG tablet Take 1 tablet (5 mg total) by mouth at bedtime. 90 tablet 4  . levETIRAcetam (KEPPRA) 100 MG/ML solution     . levothyroxine (SYNTHROID) 88 MCG tablet TAKE 1 TABLET BY MOUTH ONCE A DAY 30 tablet 3  . nystatin (NYSTATIN) powder Apply topically daily as needed (under belly rash). 60 g 3  . nystatin cream (MYCOSTATIN) Apply 1 application topically daily as needed (under belly rash). 30 g 1  . omeprazole (PRILOSEC) 40 MG capsule TAKE 1 CAPSULE BY MOUTH ONCE DAILY 90 capsule 3  . OVER THE COUNTER MEDICATION Take 2 tablets by mouth 2 (two) times daily as needed (pain). "Back and Body" over the counter    . sertraline (ZOLOFT) 50 MG tablet TAKE 1 TABLET BY MOUTH ONCE DAILY 30 tablet 2   No facility-administered medications prior to visit.     PAST MEDICAL HISTORY: Past Medical History:  Diagnosis Date  . Anxiety   . Arthritis    ?OA vs Rheum (established with Dr. Jefm Bryant pending blood work)  . Articular cartilage disorder of shoulder 04/2012   right  . Back pain    h/o DDD since 2010  . Basal cell carcinoma    right dorsum nose, right forhead  . Dementia ([place])   . Dementia ([place])   . Dental crowns present    also dental caps  .  Diabetes mellitus    NIDDM  . Fecal incontinence   . GERD (gastroesophageal reflux disease)   . Headache(784.0)    occasional  . History of skin cancer   . Hyperlipidemia    no current med.  . Hypertension   . Hyperthyroidism   . Hypothyroid   . Impingement syndrome of right shoulder 04/2012  . Memory impairment    dementia- worsenign 12/2016 with medication non compliance and delusions.    . Orthostatic dizziness   . Right rotator cuff tear 05/15/2012  . Rotator cuff rupture 04/2012   right  . Seizures ([place])   . 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 Von Willebrand disease ([place])     PAST SURGICAL HISTORY: Past Surgical History:  Procedure Laterality Date  . APPENDECTOMY    . BREAST SURGERY     hematoma evacuation due to trauma  . CARDIAC CATHETERIZATION  03/06/2001  . CESAREAN SECTION     x 2  .  COLONOSCOPY  01/20/2006 per patient  . FRACTURE SURGERY    . KNEE ARTHROSCOPY Right 12/01/2014   Procedure: ARTHROSCOPY KNEE,partial medial menisectomy and plica excision;  Surgeon: Hessie Knows, MD;  Location: [facility];  Service: Orthopedics;  Laterality: Right;  . LUMBAR LAMINECTOMY/DECOMPRESSION MICRODISCECTOMY  03/01/2009   right L4-5; arthrodesis L4-5  . ORIF WRIST FRACTURE     left  . SHOULDER ARTHROSCOPY WITH ROTATOR CUFF REPAIR AND SUBACROMIAL DECOMPRESSION  05/15/2012   Procedure: SHOULDER ARTHROSCOPY WITH ROTATOR CUFF REPAIR AND SUBACROMIAL DECOMPRESSION;  Surgeon: Johnny Bridge, MD;  Location: [place];  Service: Orthopedics;  Laterality: Right;  RIGHT ARTHROSCOPY SHOULDER DEBRIDEMENT LIMITED, ARTHROSCOPY SHOULDER DECOMPRESSION SUBACROMIAL PARTIAL ACROMIOPLASTY WITH CORACOACROMIAL RELEASE, ARTHROSCOPY SHOULDER WITH ROTATOR CUFF REPAIR  . TUBAL LIGATION      FAMILY HISTORY: Family History  Problem Relation Age of Onset  . Dementia Mother   . Arthritis Mother   . Cancer Father        unknown primary  . Heart disease Father    . Thyroid disease Sister   . Cancer Sister        brain tumor  . Dementia Sister   . COPD Brother   . Cancer Brother        bone cancer  . Colon cancer Paternal Grandfather   . Breast cancer Neg Hx     SOCIAL HISTORY: Social History   Socioeconomic History  . Marital status: Married    Spouse name: Trilby Drummer  . Number of children: 2  . Years of education: 56  . Highest education level: Not on file  Occupational History  . Occupation: Retired    Comment: retired  Scientific laboratory technician  . Financial resource strain: Not on file  . Food insecurity    Worry: Not on file    Inability: Not on file  . Transportation needs    Medical: Not on file    Non-medical: Not on file  Tobacco Use  . Smoking status: Never Smoker  . Smokeless tobacco: Never Used  Substance and Sexual Activity  . Alcohol use: No    Alcohol/week: 0.0 standard drinks  . Drug use: No  . Sexual activity: Not Currently    Birth control/protection: Surgical  Lifestyle  . Physical activity    Days per week: Not on file    Minutes per session: Not on file  . Stress: Not on file  Relationships  . Social Herbalist on phone: Not on file    Gets together: Not on file    Attends religious service: Not on file    Active member of club or organization: Not on file    Attends meetings of clubs or organizations: Not on file    Relationship status: Not on file  . Intimate partner violence    Fear of current or ex partner: Not on file    Emotionally abused: Not on file    Physically abused: Not on file    Forced sexual activity: Not on file  Other Topics Concern  . Not on file  Social History Narrative   Patient lives at home with her husband Trilby Drummer).   Patient is retired .   Education high school.   Right handed.   Caffeine sweet tea two cups.       PHYSICAL EXAM  Vitals:   03/02/19 1308  BP: 125/75  Pulse: 65  Temp: 98.6 F (37 C)  Weight: 139 lb 3.2 oz (63.1 kg)  Height: 5\' 3"  (1.6 m)    Body mass index is 24.66 kg/m.  Generalized: Well developed, in no acute distress  MMSE - Mini Mental State Exam 03/02/2019 11/30/2018 07/29/2018  Orientation to time 0 0 1  Orientation to Place 1 1 3   Registration 0 3 3  Attention/ Calculation 0 0 0  Recall 0 2 0  Language- name 2 objects 1 1 2   Language- repeat 1 1 1   Language- follow 3 step command 0 1 3  Language- read & follow direction 0 0 1  Write a sentence 0 0 1  Copy design 0 0 0  Copy design-comments - couldnt name any animals -  Total score 3 9 15     Neurological examination  Mentation: Alert, history provided by husband. Follows most exam commands, attempts to converse in conversation, verbal response is often off subject  Cranial nerve II-XII: Pupils were equal round reactive to light. Extraocular movements were full, visual field were full on confrontational test. Facial sensation and strength were normal. Head turning and shoulder shrug  were normal and symmetric. Motor: The motor testing reveals 5 over 5 strength of all 4 extremities. Good symmetric motor tone is noted throughout.  Sensory: Sensory testing is intact to soft touch on all 4 extremities. No evidence of extinction is noted.  Coordination: Cerebellar testing reveals good finger-nose-finger bilaterally Gait and station: Gait is mildly wide-based, good stride, able to rise from seated position without pushoff Reflexes: Deep tendon reflexes are symmetric and normal bilaterally.   DIAGNOSTIC DATA (LABS, IMAGING, TESTING) - I reviewed patient records, labs, notes, testing and imaging myself where available.  Lab Results  Component Value Date   WBC 6.2 02/20/2019   HGB 11.9 (L) 02/20/2019   HCT 37.8 02/20/2019   MCV 88.3 02/20/2019   PLT 176 02/20/2019      Component Value Date/Time   NA 143 02/20/2019 1247   K 3.3 (L) 02/20/2019 1247   CL 109 02/20/2019 1247   CO2 26 02/20/2019 1247   GLUCOSE 117 (H) 02/20/2019 1247   BUN 15 02/20/2019 1247    CREATININE 0.71 02/20/2019 1247   CREATININE 0.92 06/25/2018 1513   CALCIUM 8.9 02/20/2019 1247   PROT 6.7 02/20/2019 1247   ALBUMIN 4.0 02/20/2019 1247   AST 23 02/20/2019 1247   ALT 12 02/20/2019 1247   ALKPHOS 71 02/20/2019 1247   BILITOT 0.5 02/20/2019 1247   GFRNONAA >60 02/20/2019 1247   GFRNONAA 63 06/25/2018 1513   GFRAA >60 02/20/2019 1247   GFRAA 73 06/25/2018 1513   Lab Results  Component Value Date   CHOL 234 (H) 05/18/2015   HDL 57 05/18/2015   LDLCALC 137 (H) 05/18/2015   LDLDIRECT 164.0 07/08/2014   TRIG 199 (H) 05/18/2015   CHOLHDL 4.1 05/18/2015   Lab Results  Component Value Date   HGBA1C 6.0 (H) 06/25/2018   Lab Results  Component Value Date   VITAMINB12 301 10/27/2014   Lab Results  Component Value Date   TSH 4.17 06/25/2018    ASSESSMENT AND PLAN 73 y.o. year old female  has a past medical history of Anxiety, Arthritis, Articular cartilage disorder of shoulder (04/2012), Back pain, Basal cell carcinoma, Dementia ([facility]), Dementia ([place]), Dental crowns present, Diabetes mellitus, Fecal incontinence, GERD (gastroesophageal reflux disease), Headache(784.0), History of skin cancer, Hyperlipidemia, Hypertension, Hyperthyroidism, Hypothyroid, Impingement syndrome of right shoulder (04/2012), Memory impairment, Orthostatic dizziness, Right rotator cuff tear (05/15/2012), Rotator cuff rupture (04/2012), Seizures ([place]), Squamous cell carcinoma in situ (  11/04/2013), and Von Willebrand disease ([place]). here with:  1.  Dementia -Progressive, more agitation, emotional outbursts, MMSE 3/30 today, was able to follow most exam commands -Stop Aricept, difficulty getting patient to take medications -Start Seroquel 25 mg at bedtime for agitation, wandering during the night, emotional outbursts -Weight remains 139 lb, no change from last visit   2.  Seizures -Recurrent seizure 02/20/2019 in the setting of medication noncompliance, refusing to take medications -Abnormal EEG,  Focal right frontotemporal sharp waves, generalized irregular slow activity  -Switch to Keppra XR 500 mg tablets, 2 tablets at bedtime, hopefully XR will increase medication compliance, may consider switch to Lamictal in the future  -Return in 4 months or sooner if needed  I spent 25 minutes with the patient. 50% of this time was spent discussing her plan of care.   Butler Denmark, AGNP-C, DNP 03/02/2019, 1:18 PM [facility] [address], [place], [place] [address] [phone]

## 2019-03-02 ENCOUNTER — Encounter: Payer: Self-pay | Admitting: Neurology

## 2019-03-02 ENCOUNTER — Ambulatory Visit (INDEPENDENT_AMBULATORY_CARE_PROVIDER_SITE_OTHER): Payer: PPO | Admitting: Neurology

## 2019-03-02 ENCOUNTER — Other Ambulatory Visit: Payer: Self-pay

## 2019-03-02 VITALS — BP 125/75 | HR 65 | Temp 98.6°F | Ht 63.0 in | Wt 139.2 lb

## 2019-03-02 DIAGNOSIS — R569 Unspecified convulsions: Secondary | ICD-10-CM | POA: Diagnosis not present

## 2019-03-02 DIAGNOSIS — F0390 Unspecified dementia without behavioral disturbance: Secondary | ICD-10-CM

## 2019-03-02 MED ORDER — QUETIAPINE FUMARATE 25 MG PO TABS: 25.0000 mg | ORAL_TABLET | Freq: Every day | ORAL | 5 refills | Status: DC

## 2019-03-02 MED ORDER — LEVETIRACETAM ER 500 MG PO TB24: 1000.0000 mg | ORAL_TABLET | Freq: Every day | ORAL | 11 refills | Status: DC

## 2019-03-02 NOTE — Progress Notes (Signed)
I have reviewed and agreed above plan. 

## 2019-03-02 NOTE — Patient Instructions (Signed)
1. We may consider switching her seizure medication 2. Stop Aricept

## 2019-04-01 ENCOUNTER — Other Ambulatory Visit: Payer: Self-pay | Admitting: Family Medicine

## 2019-04-12 ENCOUNTER — Encounter (HOSPITAL_COMMUNITY): Payer: Self-pay

## 2019-04-12 ENCOUNTER — Emergency Department (HOSPITAL_COMMUNITY): Payer: PPO

## 2019-04-12 ENCOUNTER — Emergency Department (HOSPITAL_COMMUNITY)
Admission: EM | Admit: 2019-04-12 | Discharge: 2019-04-12 | Disposition: A | Discharge status: Home / Self Care | Payer: PPO | Attending: Emergency Medicine | Admitting: Emergency Medicine

## 2019-04-12 DIAGNOSIS — F0390 Unspecified dementia without behavioral disturbance: Secondary | ICD-10-CM | POA: Diagnosis not present

## 2019-04-12 DIAGNOSIS — R404 Transient alteration of awareness: Secondary | ICD-10-CM | POA: Diagnosis not present

## 2019-04-12 DIAGNOSIS — Z85828 Personal history of other malignant neoplasm of skin: Secondary | ICD-10-CM | POA: Diagnosis not present

## 2019-04-12 DIAGNOSIS — I10 Essential (primary) hypertension: Secondary | ICD-10-CM | POA: Diagnosis not present

## 2019-04-12 DIAGNOSIS — Z79899 Other long term (current) drug therapy: Secondary | ICD-10-CM | POA: Insufficient documentation

## 2019-04-12 DIAGNOSIS — R000 Tachycardia, unspecified: Secondary | ICD-10-CM | POA: Diagnosis not present

## 2019-04-12 DIAGNOSIS — E039 Hypothyroidism, unspecified: Secondary | ICD-10-CM | POA: Diagnosis not present

## 2019-04-12 DIAGNOSIS — E119 Type 2 diabetes mellitus without complications: Secondary | ICD-10-CM | POA: Diagnosis not present

## 2019-04-12 DIAGNOSIS — R569 Unspecified convulsions: Secondary | ICD-10-CM | POA: Diagnosis not present

## 2019-04-12 DIAGNOSIS — G40909 Epilepsy, unspecified, not intractable, without status epilepticus: Secondary | ICD-10-CM | POA: Diagnosis not present

## 2019-04-12 DIAGNOSIS — R0902 Hypoxemia: Secondary | ICD-10-CM | POA: Diagnosis not present

## 2019-04-12 LAB — CBC
HCT: 39 % (ref 36.0–46.0)
Hemoglobin: 12.6 g/dL (ref 12.0–15.0)
MCH: 27.8 pg (ref 26.0–34.0)
MCHC: 32.3 g/dL (ref 30.0–36.0)
MCV: 86.1 fL (ref 80.0–100.0)
Platelets: 238 10*3/uL (ref 150–400)
RBC: 4.53 MIL/uL (ref 3.87–5.11)
RDW: 13.5 % (ref 11.5–15.5)
WBC: 6.6 10*3/uL (ref 4.0–10.5)
nRBC: 0 % (ref 0.0–0.2)

## 2019-04-12 LAB — BASIC METABOLIC PANEL
Anion gap: 16 — ABNORMAL HIGH (ref 5–15)
BUN: 23 mg/dL (ref 8–23)
CO2: 21 mmol/L — ABNORMAL LOW (ref 22–32)
Calcium: 9.8 mg/dL (ref 8.9–10.3)
Chloride: 106 mmol/L (ref 98–111)
Creatinine, Ser: 0.84 mg/dL (ref 0.44–1.00)
GFR calc Af Amer: >60 mL/min (ref >60)
GFR calc non Af Amer: >60 mL/min (ref >60)
Glucose, Bld: 139 mg/dL — ABNORMAL HIGH (ref 70–99)
Potassium: 3.1 mmol/L — ABNORMAL LOW (ref 3.5–5.1)
Sodium: 143 mmol/L (ref 135–145)

## 2019-04-12 LAB — HEPATIC FUNCTION PANEL
ALT: 15 U/L (ref 0–44)
AST: 26 U/L (ref 15–41)
Albumin: 4.4 g/dL (ref 3.5–5.0)
Alkaline Phosphatase: 72 U/L (ref 38–126)
Bilirubin, Direct: <0.1 mg/dL (ref 0.0–0.2)
Total Bilirubin: 0.3 mg/dL (ref 0.3–1.2)
Total Protein: 7.3 g/dL (ref 6.5–8.1)

## 2019-04-12 MED ORDER — LEVETIRACETAM IN NACL 1000 MG/100ML IV SOLN
1000.0000 mg | Freq: Once | INTRAVENOUS | Status: AC
  Administered 2019-04-12: 1000 mg via INTRAVENOUS
  Filled 2019-04-12: qty 100

## 2019-04-12 NOTE — ED Notes (Signed)
Pt continues to exit bed. Family at bedside is coaching pt to remain in bed.

## 2019-04-12 NOTE — ED Notes (Signed)
Patient verbalizes understanding of discharge instructions. Opportunity for questioning and answers were provided. Armband removed by staff, pt discharged from ED in wheelchair to home.   

## 2019-04-12 NOTE — Discharge Instructions (Signed)
Please read and follow all provided instructions.  Your diagnoses today include:  1. Seizure ([place])     Tests performed today include:  Blood counts and electrolytes -shows slightly low potassium  CT of head and neck -does not show any definite fractures or other problems  Vital signs. See below for your results today.   Medications prescribed:   None  Take any prescribed medications only as directed.  Home care instructions:  Follow any educational materials contained in this packet.  Do your best to encourage your family member to take her seizure medication.   Follow-up instructions: Please follow-up with your primary care provider in the next 2 days for further evaluation of your symptoms.   Return instructions:   Please return to the Emergency Department if you experience worsening symptoms.   Please return if you have any other emergent concerns.  Additional Information:  Your vital signs today were: BP 94/82 (BP Location: Right Arm)    Pulse 83    Temp 98 F (36.7 C) (Oral)    Resp 20    SpO2 100%  If your blood pressure (BP) was elevated above 135/85 this visit, please have this repeated by your doctor within one month. --------------

## 2019-04-12 NOTE — ED Provider Notes (Signed)
[facility] EMERGENCY DEPARTMENT Provider Note   CSN: [id number] Arrival date & time: 04/12/19  1737     History   Chief Complaint Chief Complaint  Patient presents with  . Seizures    HPI Stacy Davis is a 73 y.o. female.     Patient with history of seizure disorder currently on Keppra XL at bedtime presents today with a seizure.  Patient has a history of dementia.  Level 5 caveat due to dementia.  History taken from EMS and husband.  Husband states that the patient was sitting in a chair when she suddenly had a seizure.  She had shaking and drooling for approximately 15 minutes.  Her symptoms were the same as previous seizures.  Husband states that he has been trying to get her to take her medicines at night but she has not had medicines for 3 days.  She refuses to take them.  Otherwise, she is in her normal state of health without any other infectious symptoms such as fever, URI symptoms, chest pain, cough, vomiting, diarrhea, urinary symptoms.     Past Medical History:  Diagnosis Date  . Anxiety   . Arthritis    ?OA vs Rheum (established with Dr. Jefm Bryant pending blood work)  . Articular cartilage disorder of shoulder 04/2012   right  . Back pain    h/o DDD since 2010  . Basal cell carcinoma    right dorsum nose, right forhead  . Dementia ([place])   . Dementia ([place])   . Dental crowns present    also dental caps  . Diabetes mellitus    NIDDM  . Fecal incontinence   . GERD (gastroesophageal reflux disease)   . Headache(784.0)    occasional  . History of skin cancer   . Hyperlipidemia    no current med.  . Hypertension   . Hyperthyroidism   . Hypothyroid   . Impingement syndrome of right shoulder 04/2012  . Memory impairment    dementia- worsenign 12/2016 with medication non compliance and delusions.    . Orthostatic dizziness   . Right rotator cuff tear 05/15/2012  . Rotator cuff rupture 04/2012   right  . Seizures ([place])   . 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 Von Willebrand disease [facility])     Patient Active Problem List   Diagnosis Date Noted  . Orthostatic dizziness   . Dementia ([place])   . Seizures ([place]) 06/16/2018  . Subarachnoid hemorrhage ([place])   . Syncope 04/11/2018  . Incontinence of feces 01/08/2018  . Depression 10/08/2017  . Memory impairment   . Advance care planning 10/06/2014  . Joint pain 04/15/2013  . Advance directive on file 08/12/2012  . Confusion 06/10/2012  . Right rotator cuff tear 05/15/2012  . Shoulder pain 05/03/2012  . Insomnia 08/13/2011  . 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 HTN (hypertension) 07/18/2011  . HLD (hyperlipidemia) 07/18/2011  . Memory loss 07/18/2011  . Hypothyroid 07/18/2011  . SVT (supraventricular tachycardia) ([place]) 07/18/2011  . Von Willebrand disease ([place]) 07/18/2011  . GERD (gastroesophageal reflux disease) 07/18/2011  . Back pain 07/18/2011  . Fatty liver 07/18/2011    Past Surgical History:  Procedure Laterality Date  . APPENDECTOMY    . BREAST SURGERY     hematoma evacuation due to trauma  . CARDIAC CATHETERIZATION  03/06/2001  . CESAREAN SECTION     x 2  . COLONOSCOPY  01/20/2006  per patient  . FRACTURE SURGERY    . KNEE ARTHROSCOPY Right 12/01/2014   Procedure: ARTHROSCOPY KNEE,partial medial menisectomy and plica excision;  Surgeon: Hessie Knows, MD;  Location: [facility] ORS;  Service: Orthopedics;  Laterality: Right;  . LUMBAR LAMINECTOMY/DECOMPRESSION MICRODISCECTOMY  03/01/2009   right L4-5; arthrodesis L4-5  . ORIF WRIST FRACTURE     left  . SHOULDER ARTHROSCOPY WITH ROTATOR CUFF REPAIR AND SUBACROMIAL DECOMPRESSION  05/15/2012   Procedure: SHOULDER ARTHROSCOPY WITH ROTATOR CUFF REPAIR AND SUBACROMIAL DECOMPRESSION;  Surgeon: Johnny Bridge, MD;  Location: [place] [facility];  Service: Orthopedics;  Laterality: Right;  RIGHT ARTHROSCOPY SHOULDER DEBRIDEMENT LIMITED,  ARTHROSCOPY SHOULDER DECOMPRESSION SUBACROMIAL PARTIAL ACROMIOPLASTY WITH CORACOACROMIAL RELEASE, ARTHROSCOPY SHOULDER WITH ROTATOR CUFF REPAIR  . TUBAL LIGATION       OB History   No obstetric history on file.      Home Medications    Prior to Admission medications   Medication Sig Start Date End Date Taking? Authorizing Provider  levETIRAcetam (KEPPRA XR) 500 MG 24 hr tablet Take 2 tablets (1,000 mg total) by mouth at bedtime. 03/02/19   Suzzanne Cloud, NP  levothyroxine (SYNTHROID) 88 MCG tablet TAKE 1 TABLET BY MOUTH ONCE A DAY 10/05/18   Susy Frizzle, MD  nystatin (NYSTATIN) powder Apply topically daily as needed (under belly rash). 10/08/18   Susy Frizzle, MD  nystatin cream (MYCOSTATIN) Apply 1 application topically daily as needed (under belly rash). 10/08/18   Susy Frizzle, MD  omeprazole (PRILOSEC) 40 MG capsule TAKE 1 CAPSULE BY MOUTH ONCE DAILY 04/01/19   Susy Frizzle, MD  OVER THE COUNTER MEDICATION Take 2 tablets by mouth 2 (two) times daily as needed (pain). "Back and Body" over the counter    [provider]  QUEtiapine (SEROQUEL) 25 MG tablet Take 1 tablet (25 mg total) by mouth at bedtime. 03/02/19   Suzzanne Cloud, NP  sertraline (ZOLOFT) 50 MG tablet TAKE 1 TABLET BY MOUTH ONCE DAILY 09/23/18   Susy Frizzle, MD    Family History Family History  Problem Relation Age of Onset  . Dementia Mother   . Arthritis Mother   . Cancer Father        unknown primary  . Heart disease Father   . Thyroid disease Sister   . Cancer Sister        brain tumor  . Dementia Sister   . COPD Brother   . Cancer Brother        bone cancer  . Colon cancer Paternal Grandfather   . Breast cancer Neg Hx     Social History Social History   Tobacco Use  . Smoking status: Never Smoker  . Smokeless tobacco: Never Used  Substance Use Topics  . Alcohol use: No    Alcohol/week: 0.0 standard drinks  . Drug use: No     Allergies   Tramadol   Review  of Systems Review of Systems  Unable to perform ROS: Dementia  Neurological: Positive for seizures.     Physical Exam Updated Vital Signs BP 130/75   Pulse 80   Temp 98 F (36.7 C) (Oral)   Resp 19   SpO2 98%   Physical Exam Vitals signs and nursing note reviewed.  Constitutional:      Appearance: She is well-developed.  HENT:     Head: Normocephalic and atraumatic.  Eyes:     General:        Right eye: No discharge.  Left eye: No discharge.     Conjunctiva/sclera: Conjunctivae normal.  Neck:     Musculoskeletal: Neck supple.     Comments: Immobilized in c-collar. Cardiovascular:     Rate and Rhythm: Normal rate and regular rhythm.     Heart sounds: Normal heart sounds.  Pulmonary:     Effort: Pulmonary effort is normal.     Breath sounds: Normal breath sounds.  Abdominal:     Palpations: Abdomen is soft.     Tenderness: There is no abdominal tenderness.  Skin:    General: Skin is warm and dry.  Neurological:     General: No focal deficit present.     Mental Status: She is alert. She is disoriented.     Motor: No weakness.      ED Treatments / Results  Labs (all labs ordered are listed, but only abnormal results are displayed) Labs Reviewed  CBC  BASIC METABOLIC PANEL    EKG None  Radiology No results found.  Procedures Procedures (including critical care time)  Medications Ordered in ED Medications  levETIRAcetam (KEPPRA) IVPB 1000 mg/100 mL premix (has no administration in time range)     Initial Impression / Assessment and Plan / ED Course  I have reviewed the triage vital signs and the nursing notes.  Pertinent labs & imaging results that were available during my care of the patient were reviewed by me and considered in my medical decision making (see chart for details).        Patient seen and examined.   Vital signs reviewed and are as follows: BP 130/75   Pulse 80   Temp 98 F (36.7 C) (Oral)   Resp 19   SpO2 98%    Husband now at bedside and additional history obtained as above.  Awaiting imaging studies.  Will give Keppra load.   Patient discussed with and seen by Dr. Ralene Bathe.  Labs reassuring.  IV Keppra given.  Dr. Ralene Bathe did discuss patient with Dr. Rory Percy.  No further recommendations.  Patient will need to follow-up with neurology to discuss if they want to change her medications or try any different strategies.  Discussed findings with husband at bedside.  He is in agreement with plan.  Encouraged trying to get her to take 1 Keppra at home tonight and resume typical schedule tomorrow.  Message sent to patient's neurologist NP requesting follow-up.  Encouraged return with worsening, recurrent seizures, new symptoms or other concerns.  Final Clinical Impressions(s) / ED Diagnoses   Final diagnoses:  Seizure [facility])   Patient with 1 witnessed seizure tonight in setting of known seizure disorder, noncompliance with Keppra, complicated by her underlying dementia which seems to be progressing.  No infectious insult suspected.  Lab work is okay.  Head and neck imaging degraded by motion artifact however patient does not clinically have a fracture.  She will need close follow-up.  Discharge home at this time.  No indications for admission.  ED Discharge Orders    None       Carlisle Cater, Hershal Coria 04/12/19 2151    Quintella Reichert, MD 04/13/19 1212

## 2019-04-12 NOTE — ED Notes (Signed)
Family is at bedside. °

## 2019-04-12 NOTE — ED Triage Notes (Signed)
Pt BIB [facility] after a witnessed seizure at home (lasted about 15 min according to EMS). Slid off couch but no injuries noted. Pt A&O to time and self only (Hx of dementia.   EMS VSS: BP:117/70 HR: 100 CBG:128  Recent Hx of Seizures, has not been taking medication

## 2019-04-13 ENCOUNTER — Telehealth: Payer: Self-pay | Admitting: Neurology

## 2019-04-13 NOTE — Telephone Encounter (Signed)
I received a note from the ED provider about this patient. She was recently in the ER for seizure, in setting of non-compliance, worsening dementia. Can you move her appointment with Dr. Krista Blue up for the next few weeks.

## 2019-04-14 ENCOUNTER — Telehealth: Payer: Self-pay | Admitting: Family Medicine

## 2019-04-14 NOTE — Telephone Encounter (Signed)
I spoke to her husband and she has been scheduled to see Dr. Krista Blue on 05/05/2019.

## 2019-04-14 NOTE — Telephone Encounter (Signed)
Received a phone call from Kirby Funk a nurse with [facility] stating that she has spoken with patient's husband whom is her caregiver in the setting of her dementia. Patient's husband is set to have an upcoming ankle surgery in December. He is requesting a referral for home health to come in to assist him with caring for his wife due to her dementia since he will not be able to do everything for her after his surgery. I tried to call to get patient scheduled for an office visit as this would be necessary for medicare to pay for [facility] phone rang and there was no voicemail. If you can will you also try to contact them

## 2019-04-15 ENCOUNTER — Other Ambulatory Visit: Payer: Self-pay

## 2019-04-15 NOTE — Patient Outreach (Signed)
[facility]) Care Management  04/15/2019  SHONDREA TRIM [birth year] [id number]   Social work referral received from [facility] regarding need for aide services.  Per referral, spouse is primary caregiver for patient and has foot surgery scheduled for 05/13/19. Seeking assistance during two week recovery period. Per referral, UM staff contacted patient's PCP, Jenna Luo regarding possible [place] services.  Spouse/caregiver would like options in the event HTA does not cover services. Successful outreach to patient's spouse today.  He is seeking assistance for patient from 05/14/19 until at least 05/28/19.  Monday-Friday from 1:00-5:00.  Agreed to contact some local agencies regarding availability and approximate cost.  Contacted the following: -[facility]-no private pay -Home Care from [facility]-Left message -[facility]-has availability.  Needs at least 3 day notice to complete assessment.  $18.50/hr. -[facility]- has availability.  Needs at least 3 day notice to complete assessment.  $20/hr -[place] availability in [place], [place] Worker [phone]

## 2019-04-16 ENCOUNTER — Other Ambulatory Visit: Payer: Self-pay

## 2019-04-16 NOTE — Patient Outreach (Signed)
[facility]) Care Management  04/16/2019  Stacy Davis March 17, 1946 [id number]   Follow up call to patient's spouse today.  Provided him with contact and cost information for [facility] and [facility] in the event he needs private pay aide services for patient after his upcoming surgery. Waiting for return call from Stacy Davis regarding availability and cost. Spouse requested follow up from [facility] UM staff, Stacy Davis.  Messaged her requesting that she contact spouse. Will follow up within the next two weeks.  Stacy Davis, BSW Social Worker [phone]

## 2019-04-16 NOTE — Telephone Encounter (Signed)
Spoke to pt's husband and she has an apt with Dr. Dennard Schaumann

## 2019-04-17 ENCOUNTER — Other Ambulatory Visit: Payer: Self-pay | Admitting: Family Medicine

## 2019-04-26 ENCOUNTER — Other Ambulatory Visit: Payer: Self-pay

## 2019-04-26 ENCOUNTER — Emergency Department (HOSPITAL_COMMUNITY)
Admission: EM | Admit: 2019-04-26 | Discharge: 2019-04-26 | Disposition: A | Discharge status: Home / Self Care | Payer: PPO | Attending: Emergency Medicine | Admitting: Emergency Medicine

## 2019-04-26 ENCOUNTER — Emergency Department (HOSPITAL_COMMUNITY): Payer: PPO

## 2019-04-26 DIAGNOSIS — Z79899 Other long term (current) drug therapy: Secondary | ICD-10-CM | POA: Insufficient documentation

## 2019-04-26 DIAGNOSIS — S0990XA Unspecified injury of head, initial encounter: Secondary | ICD-10-CM | POA: Diagnosis not present

## 2019-04-26 DIAGNOSIS — Y939 Activity, unspecified: Secondary | ICD-10-CM | POA: Diagnosis not present

## 2019-04-26 DIAGNOSIS — I10 Essential (primary) hypertension: Secondary | ICD-10-CM | POA: Insufficient documentation

## 2019-04-26 DIAGNOSIS — W07XXXA Fall from chair, initial encounter: Secondary | ICD-10-CM | POA: Diagnosis not present

## 2019-04-26 DIAGNOSIS — Y92009 Unspecified place in unspecified non-institutional (private) residence as the place of occurrence of the external cause: Secondary | ICD-10-CM | POA: Diagnosis not present

## 2019-04-26 DIAGNOSIS — R404 Transient alteration of awareness: Secondary | ICD-10-CM | POA: Diagnosis not present

## 2019-04-26 DIAGNOSIS — E039 Hypothyroidism, unspecified: Secondary | ICD-10-CM | POA: Diagnosis not present

## 2019-04-26 DIAGNOSIS — E119 Type 2 diabetes mellitus without complications: Secondary | ICD-10-CM | POA: Insufficient documentation

## 2019-04-26 DIAGNOSIS — Z03818 Encounter for observation for suspected exposure to other biological agents ruled out: Secondary | ICD-10-CM | POA: Diagnosis not present

## 2019-04-26 DIAGNOSIS — F0390 Unspecified dementia without behavioral disturbance: Secondary | ICD-10-CM | POA: Diagnosis not present

## 2019-04-26 DIAGNOSIS — I959 Hypotension, unspecified: Secondary | ICD-10-CM | POA: Diagnosis not present

## 2019-04-26 DIAGNOSIS — R569 Unspecified convulsions: Secondary | ICD-10-CM | POA: Diagnosis not present

## 2019-04-26 DIAGNOSIS — Z20828 Contact with and (suspected) exposure to other viral communicable diseases: Secondary | ICD-10-CM | POA: Insufficient documentation

## 2019-04-26 DIAGNOSIS — F0391 Unspecified dementia with behavioral disturbance: Secondary | ICD-10-CM | POA: Insufficient documentation

## 2019-04-26 DIAGNOSIS — Y999 Unspecified external cause status: Secondary | ICD-10-CM | POA: Insufficient documentation

## 2019-04-26 DIAGNOSIS — W19XXXA Unspecified fall, initial encounter: Secondary | ICD-10-CM

## 2019-04-26 LAB — CBC
HCT: 40.7 % (ref 36.0–46.0)
Hemoglobin: 12.9 g/dL (ref 12.0–15.0)
MCH: 27.9 pg (ref 26.0–34.0)
MCHC: 31.7 g/dL (ref 30.0–36.0)
MCV: 87.9 fL (ref 80.0–100.0)
Platelets: 200 10*3/uL (ref 150–400)
RBC: 4.63 MIL/uL (ref 3.87–5.11)
RDW: 14.2 % (ref 11.5–15.5)
WBC: 5.9 10*3/uL (ref 4.0–10.5)
nRBC: 0 % (ref 0.0–0.2)

## 2019-04-26 LAB — BASIC METABOLIC PANEL
Anion gap: 11 (ref 5–15)
BUN: 12 mg/dL (ref 8–23)
CO2: 27 mmol/L (ref 22–32)
Calcium: 9.5 mg/dL (ref 8.9–10.3)
Chloride: 106 mmol/L (ref 98–111)
Creatinine, Ser: 0.9 mg/dL (ref 0.44–1.00)
GFR calc Af Amer: >60 mL/min (ref >60)
GFR calc non Af Amer: >60 mL/min (ref >60)
Glucose, Bld: 131 mg/dL — ABNORMAL HIGH (ref 70–99)
Potassium: 3.2 mmol/L — ABNORMAL LOW (ref 3.5–5.1)
Sodium: 144 mmol/L (ref 135–145)

## 2019-04-26 LAB — SARS CORONAVIRUS 2 (TAT 6-24 HRS): SARS Coronavirus 2: NEGATIVE

## 2019-04-26 LAB — CBG MONITORING, ED: Glucose-Capillary: 120 mg/dL — ABNORMAL HIGH (ref 70–99)

## 2019-04-26 MED ORDER — LEVETIRACETAM 500 MG PO TABS
1000.0000 mg | ORAL_TABLET | Freq: Once | ORAL | Status: AC
  Administered 2019-04-26: 1000 mg via ORAL
  Filled 2019-04-26: qty 2

## 2019-04-26 MED ORDER — LORAZEPAM 2 MG/ML IJ SOLN
0.5000 mg | Freq: Once | INTRAMUSCULAR | Status: AC
  Administered 2019-04-26: 0.5 mg via INTRAVENOUS
  Filled 2019-04-26: qty 1

## 2019-04-26 MED ORDER — SODIUM CHLORIDE 0.9 % IV BOLUS: 1000.0000 mL | Freq: Once | INTRAVENOUS | Status: DC

## 2019-04-26 MED ORDER — LEVETIRACETAM IN NACL 1000 MG/100ML IV SOLN
1000.0000 mg | Freq: Once | INTRAVENOUS | Status: DC
  Filled 2019-04-26: qty 100

## 2019-04-26 NOTE — Care Management Note (Signed)
ED CM consulted by Dr. Billy Fischer concerning assistance with transitional care planning. Patient lives at home with her elderly husband. Patient has been evaluated in the ED and no significant findings was noted, family is reporting patient not eating drinking  or taking medications. CM discussed HH options RN, PT, OT, HHA, SW,  patient and spouse are agreeable. Offered choice as per CMS list provided, patient and family did not have a preference  HTA payor, [facility] was selected. Referral called into [facility] liaison [place] referral was accepted a SLP was also recommended for cognition due to dementia. Start of care will begin 24-48 hurs post discharge from the ED.  Updated EDP and RN on plan for [facility] services. ED CSW has spoken with patient's spouse and will be faxing over resources as per patient's nurse. Information was placed on patients AVS

## 2019-04-26 NOTE — ED Triage Notes (Signed)
Pt arrives via EMS from home found on the floor by husband. Per family has hx of seizures. Pt postictal initally but now more alert. Alert x2. Hx of alzeheimers. Denies pain. VSS. cbg 120. 20g RAC. Noncompliant with seizure meds.

## 2019-04-26 NOTE — ED Notes (Signed)
Pt very upset and disoriented from being at hospital. Husband is now at bedside.

## 2019-04-26 NOTE — Progress Notes (Addendum)
Consult request has been received. CSW attempting to follow up at present time.  CSW spoke to EDP.  CSW reviewed chart and sees pt's spouse was contacted by the [facility] BSW rep on 11/20 about options for the pt's care once pt's spouse has surgery on his ankle sometime in December  (see below):   Follow up call to patient's spouse today.  Provided him with contact and cost information for [facility] and [facility] in the event he needs private pay aide services for patient after his upcoming surgery. Waiting for return call from [place] regarding availability and cost. Spouse requested follow up from [facility] UM staff, Kennyth Arnold.  Messaged her requesting that she contact spouse. Will follow up within the next two weeks.  *End of Note from 11/20*  Per EDP, pt's spouse is at bedside now.  CSW called pt's husband at ph: [phone] who stated that his ankle surgery which is scheduled for 12/17 would require 2-3 weeks of being off his feet.    CSW counseled pt's daughter on SNF's, ALF's and Memory Care, how they work, who is qualified to go there, insurances that do and don't pay for them and how it is paid for otherwise.  CSW will provide pt's spouse with a list of Assisted Living Facilities in the [place] area and counseled pt on what respite care is and how admission for respite care is sometimes attained.  At pt's spouse's request CSW will also provide pt's spouse with contact info for [facility] and Corliss Blacker at the contact person and fax both of the above to the pt via pt's RN to [facility] ED Fax machine behind the ED Secretary's at [phone] ad will updated RN and EDP.  Pt's spouse was appreciative and thanked the CSW. Pt's spouse ventilated frustration and his and the pt's difficult situation and  CSW actively validated the pt's opinion and provided active listening and feedback.  7:21 PM Information was faxed to [facility] ED and RN CM agrees to  provide to pt/RN with instructions.  CSW will continue to follow for D/C needs.  Alphonse Guild. Lea Walbert, LCSW, LCAS, CSI Transitions of Care Clinical Social Worker Care Coordination Department Ph: [phone]

## 2019-04-26 NOTE — ED Provider Notes (Addendum)
[facility] EMERGENCY DEPARTMENT Provider Note   CSN: [id number] Arrival date & time: 04/26/19  1237     History   Chief Complaint Chief Complaint  Patient presents with  . Seizures    HPI Stacy Davis is a 73 y.o. female.     HPI Level 5 caveat due to dementia. Patient reportedly was at home with husband.  [place] forward out of a chair.  Question of seizure but did not see seizure activity.  History comes from husband.  Patient cannot really tell me what happened.  History of seizure disorder but noncompliant with medicines due to dementia. Past Medical History:  Diagnosis Date  . Anxiety   . Arthritis    ?OA vs Rheum (established with Dr. Jefm Bryant pending blood work)  . Articular cartilage disorder of shoulder 04/2012   right  . Back pain    h/o DDD since 2010  . Basal cell carcinoma    right dorsum nose, right forhead  . Dementia ([place])   . Dementia ([place])   . Dental crowns present    also dental caps  . Diabetes mellitus    NIDDM  . Fecal incontinence   . GERD (gastroesophageal reflux disease)   . Headache(784.0)    occasional  . History of skin cancer   . Hyperlipidemia    no current med.  . Hypertension   . Hyperthyroidism   . Hypothyroid   . Impingement syndrome of right shoulder 04/2012  . Memory impairment    dementia- worsenign 12/2016 with medication non compliance and delusions.    . Orthostatic dizziness   . Right rotator cuff tear 05/15/2012  . Rotator cuff rupture 04/2012   right  . Seizures ([place])   . 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 Von Willebrand disease [facility])     Patient Active Problem List   Diagnosis Date Noted  . Orthostatic dizziness   . Dementia ([place])   . Seizures ([place]) 06/16/2018  . Subarachnoid hemorrhage ([place])   . Syncope 04/11/2018  . Incontinence of feces 01/08/2018  . Depression 10/08/2017  . Memory impairment   . Advance care planning 10/06/2014  . Joint pain  04/15/2013  . Advance directive on file 08/12/2012  . Confusion 06/10/2012  . Right rotator cuff tear 05/15/2012  . Shoulder pain 05/03/2012  . Insomnia 08/13/2011  . 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 HTN (hypertension) 07/18/2011  . HLD (hyperlipidemia) 07/18/2011  . Memory loss 07/18/2011  . Hypothyroid 07/18/2011  . SVT (supraventricular tachycardia) ([place]) 07/18/2011  . Von Willebrand disease ([place]) 07/18/2011  . GERD (gastroesophageal reflux disease) 07/18/2011  . Back pain 07/18/2011  . Fatty liver 07/18/2011    Past Surgical History:  Procedure Laterality Date  . APPENDECTOMY    . BREAST SURGERY     hematoma evacuation due to trauma  . CARDIAC CATHETERIZATION  03/06/2001  . CESAREAN SECTION     x 2  . COLONOSCOPY  01/20/2006 per patient  . FRACTURE SURGERY    . KNEE ARTHROSCOPY Right 12/01/2014   Procedure: ARTHROSCOPY KNEE,partial medial menisectomy and plica excision;  Surgeon: Hessie Knows, MD;  Location: [facility] ORS;  Service: Orthopedics;  Laterality: Right;  . LUMBAR LAMINECTOMY/DECOMPRESSION MICRODISCECTOMY  03/01/2009   right L4-5; arthrodesis L4-5  . ORIF WRIST FRACTURE     left  . SHOULDER ARTHROSCOPY WITH ROTATOR CUFF REPAIR AND SUBACROMIAL DECOMPRESSION  05/15/2012   Procedure: SHOULDER ARTHROSCOPY WITH  ROTATOR CUFF REPAIR AND SUBACROMIAL DECOMPRESSION;  Surgeon: Johnny Bridge, MD;  Location: [facility];  Service: Orthopedics;  Laterality: Right;  RIGHT ARTHROSCOPY SHOULDER DEBRIDEMENT LIMITED, ARTHROSCOPY SHOULDER DECOMPRESSION SUBACROMIAL PARTIAL ACROMIOPLASTY WITH CORACOACROMIAL RELEASE, ARTHROSCOPY SHOULDER WITH ROTATOR CUFF REPAIR  . TUBAL LIGATION       OB History   No obstetric history on file.      Home Medications    Prior to Admission medications   Medication Sig Start Date End Date Taking? Authorizing Provider  levETIRAcetam (KEPPRA XR) 500 MG 24 hr tablet Take 2 tablets (1,000 mg  total) by mouth at bedtime. 03/02/19   Suzzanne Cloud, NP  levothyroxine (SYNTHROID) 88 MCG tablet TAKE 1 TABLET BY MOUTH ONCE A DAY Patient taking differently: Take 88 mcg by mouth daily.  10/05/18   Susy Frizzle, MD  nystatin cream (MYCOSTATIN) APPLY 1 APPLICATION TOPICALLY DAILY AS NEEDED UNDER BELLY RASH 04/19/19   Susy Frizzle, MD  omeprazole (PRILOSEC) 40 MG capsule TAKE 1 CAPSULE BY MOUTH ONCE DAILY Patient taking differently: Take 40 mg by mouth daily.  04/01/19   Susy Frizzle, MD  OVER THE COUNTER MEDICATION Take 2 tablets by mouth 2 (two) times daily as needed (pain). "Back and Body" over the counter    [provider]  QUEtiapine (SEROQUEL) 25 MG tablet Take 1 tablet (25 mg total) by mouth at bedtime. 03/02/19   Suzzanne Cloud, NP  sertraline (ZOLOFT) 50 MG tablet TAKE 1 TABLET BY MOUTH ONCE DAILY Patient taking differently: Take 50 mg by mouth daily.  09/23/18   Susy Frizzle, MD    Family History Family History  Problem Relation Age of Onset  . Dementia Mother   . Arthritis Mother   . Cancer Father        unknown primary  . Heart disease Father   . Thyroid disease Sister   . Cancer Sister        brain tumor  . Dementia Sister   . COPD Brother   . Cancer Brother        bone cancer  . Colon cancer Paternal Grandfather   . Breast cancer Neg Hx     Social History Social History   Tobacco Use  . Smoking status: Never Smoker  . Smokeless tobacco: Never Used  Substance Use Topics  . Alcohol use: No    Alcohol/week: 0.0 standard drinks  . Drug use: No     Allergies   Tramadol   Review of Systems Review of Systems  Unable to perform ROS: Dementia     Physical Exam Updated Vital Signs BP (!) 110/47   Pulse 79   Temp 98.2 F (36.8 C) (Oral)   Resp 15   SpO2 100%   Physical Exam Vitals signs reviewed.  HENT:     Head: Atraumatic.  Eyes:     Extraocular Movements: Extraocular movements intact.  Neck:     Musculoskeletal:  Neck supple.  Cardiovascular:     Rate and Rhythm: Regular rhythm.  Pulmonary:     Breath sounds: No wheezing, rhonchi or rales.  Abdominal:     Tenderness: There is no abdominal tenderness.  Skin:    General: Skin is warm.     Capillary Refill: Capillary refill takes less than 2 seconds.  Neurological:     Mental Status: She is alert. Mental status is at baseline.  Psychiatric:     Comments: Patient somewhat agitated  ED Treatments / Results  Labs (all labs ordered are listed, but only abnormal results are displayed) Labs Reviewed  BASIC METABOLIC PANEL - Abnormal; Notable for the following components:      Result Value   Potassium 3.2 (*)    Glucose, Bld 131 (*)    All other components within normal limits  CBG MONITORING, ED - Abnormal; Notable for the following components:   Glucose-Capillary 120 (*)    All other components within normal limits  CBC    EKG None  Radiology Ct Head Wo Contrast  Result Date: 04/26/2019 CLINICAL DATA:  72 year old female with head trauma. EXAM: CT HEAD WITHOUT CONTRAST TECHNIQUE: Contiguous axial images were obtained from the base of the skull through the vertex without intravenous contrast. COMPARISON:  Head CT dated 04/12/2019. FINDINGS: Brain: The ventricles and sulci appropriate size for patient's age. Mild periventricular and deep white matter chronic microvascular ischemic changes noted. There is no acute intracranial hemorrhage. No mass effect or midline shift. No extra-axial fluid collection. Vascular: No hyperdense vessel or unexpected calcification. Skull: Normal. Negative for fracture or focal lesion. Sinuses/Orbits: No acute finding. Other: None IMPRESSION: 1. No acute intracranial hemorrhage. 2. Mild chronic microvascular ischemic changes. Electronically Signed   By: Anner Crete M.D.   On: 04/26/2019 15:41    Procedures Procedures (including critical care time)  Medications Ordered in ED Medications  LORazepam  (ATIVAN) injection 0.5 mg (0.5 mg Intravenous Given 04/26/19 1409)     Initial Impression / Assessment and Plan / ED Course  I have reviewed the triage vital signs and the nursing notes.  Pertinent labs & imaging results that were available during my care of the patient were reviewed by me and considered in my medical decision making (see chart for details).        Patient with fall out of chair.  Did not see clear injury.  Has had seizures before but no witnessed seizure activity and husband saw her almost immediately after the fall.  Head CT reassuring.  Lab work reassuring.  Has been noncompliant with her medicines and more difficult to control due to her dementia.  At this point appears medically cleared.  Will have patient seen by social work.  Patient's husband states that she is eating very little at home.  He states she will not last very long as she does not eat.  Care turned over to oncoming provider, Dr. Billy Fischer   Final Clinical Impressions(s) / ED Diagnoses   Final diagnoses:  Fall, initial encounter  Dementia with behavioral disturbance, unspecified dementia type [facility])    ED Discharge Orders    None       Davonna Belling, MD 04/26/19 [place]    Davonna Belling, MD 04/26/19 [phone]

## 2019-04-26 NOTE — ED Notes (Signed)
Patient verbalizes understanding of discharge instructions. Opportunity for questioning and answers were provided. Armband removed by staff, pt discharged from ED.  

## 2019-04-27 ENCOUNTER — Ambulatory Visit: Payer: PPO | Admitting: Family Medicine

## 2019-04-30 ENCOUNTER — Other Ambulatory Visit: Payer: Self-pay

## 2019-04-30 DIAGNOSIS — F0281 Dementia in other diseases classified elsewhere with behavioral disturbance: Secondary | ICD-10-CM | POA: Diagnosis not present

## 2019-04-30 DIAGNOSIS — F419 Anxiety disorder, unspecified: Secondary | ICD-10-CM | POA: Diagnosis not present

## 2019-04-30 DIAGNOSIS — M1990 Unspecified osteoarthritis, unspecified site: Secondary | ICD-10-CM | POA: Diagnosis not present

## 2019-04-30 DIAGNOSIS — I10 Essential (primary) hypertension: Secondary | ICD-10-CM | POA: Diagnosis not present

## 2019-04-30 DIAGNOSIS — K219 Gastro-esophageal reflux disease without esophagitis: Secondary | ICD-10-CM | POA: Diagnosis not present

## 2019-04-30 DIAGNOSIS — D680 Von Willebrand's disease: Secondary | ICD-10-CM | POA: Diagnosis not present

## 2019-04-30 DIAGNOSIS — Z9181 History of falling: Secondary | ICD-10-CM | POA: Diagnosis not present

## 2019-04-30 DIAGNOSIS — E785 Hyperlipidemia, unspecified: Secondary | ICD-10-CM | POA: Diagnosis not present

## 2019-04-30 DIAGNOSIS — G4700 Insomnia, unspecified: Secondary | ICD-10-CM | POA: Diagnosis not present

## 2019-04-30 DIAGNOSIS — F329 Major depressive disorder, single episode, unspecified: Secondary | ICD-10-CM | POA: Diagnosis not present

## 2019-04-30 DIAGNOSIS — G40909 Epilepsy, unspecified, not intractable, without status epilepticus: Secondary | ICD-10-CM | POA: Diagnosis not present

## 2019-04-30 DIAGNOSIS — E039 Hypothyroidism, unspecified: Secondary | ICD-10-CM | POA: Diagnosis not present

## 2019-04-30 DIAGNOSIS — Z85828 Personal history of other malignant neoplasm of skin: Secondary | ICD-10-CM | POA: Diagnosis not present

## 2019-04-30 DIAGNOSIS — E1129 Type 2 diabetes mellitus with other diabetic kidney complication: Secondary | ICD-10-CM | POA: Diagnosis not present

## 2019-04-30 NOTE — Patient Outreach (Signed)
[facility]) Care Management  04/30/2019  Stacy Davis January 31, 1946 [id number]   Follow up call to patient' spouse regarding social work referral for in home assistance needed while spouse recovers from upcoming surgery.  Spouse reported today that they located an individual who will start assisting in the home next week and plans to help for "several weeks."  Spouse has not contacted agencies provided during last call; provided him with contact information for [employer] and [facility].  Spouse did say that he was contacted by [facility] staff, Stacy Davis. Closing social work case at this time but did encourage spouse to call if additional needs arise.   [name], BSW Social Worker [phone]

## 2019-05-05 ENCOUNTER — Ambulatory Visit (INDEPENDENT_AMBULATORY_CARE_PROVIDER_SITE_OTHER): Payer: PPO | Admitting: Neurology

## 2019-05-05 ENCOUNTER — Encounter: Payer: Self-pay | Admitting: Neurology

## 2019-05-05 ENCOUNTER — Other Ambulatory Visit: Payer: Self-pay

## 2019-05-05 VITALS — BP 129/82 | HR 74 | Temp 97.5°F | Ht 63.0 in | Wt 129.4 lb

## 2019-05-05 DIAGNOSIS — F320 Major depressive disorder, single episode, mild: Secondary | ICD-10-CM | POA: Diagnosis not present

## 2019-05-05 DIAGNOSIS — R569 Unspecified convulsions: Secondary | ICD-10-CM

## 2019-05-05 DIAGNOSIS — F0390 Unspecified dementia without behavioral disturbance: Secondary | ICD-10-CM

## 2019-05-05 MED ORDER — SERTRALINE HCL 50 MG PO TABS: 50.0000 mg | ORAL_TABLET | Freq: Every day | ORAL | 4 refills | Status: DC

## 2019-05-05 MED ORDER — LEVETIRACETAM ER 500 MG PO TB24: 1000.0000 mg | ORAL_TABLET | Freq: Every day | ORAL | 4 refills | Status: DC

## 2019-05-05 MED ORDER — QUETIAPINE FUMARATE 25 MG PO TABS: 25.0000 mg | ORAL_TABLET | Freq: Every day | ORAL | 4 refills | Status: AC

## 2019-05-05 NOTE — Progress Notes (Signed)
PATIENT: Stacy Davis DOB: 06/14/1945  REASON FOR VISIT: follow up HISTORY FROM: patient  HISTORY OF PRESENT ILLNESS: Today 05/05/19  HISTORY  Today 11/30/18  HISTORY Stacy Davis a 73 years old female, seen in request byher primary care physician Dr.Pickard, [place] worsening dementia, seizure, She is with her husband Trilby Drummer at today's visit on July 29, 2018.  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  I reviewed hospital discharge on June 16, 2018,shewas admitted for collapse, similar presentation in August 2019, according to husband, total of 3 episodes, sudden loss of consciousness,generalized tonic-clonic seizure,suffered a fall, first episode in early 2019, was walking around the bed suddenly collapsed with drooling from the mouth jerking movement in all extremities, no bowel and bladder incontinence, second episode in August, collapsed to the kitchen floor, husband found her jerking, extremity movement, third episode 2 AM on June 15, 2018, was found on the edge of the bed, with seizure activities.patient appearedto have  breathing difficulty, required bagging from paramedic, confused, then she was able to back to her baseline, she had no recollection of the fall,   EEG is consistent with an area of potential epileptogenicity in the right frontotemporal region. There was no seizure recorded on this study.  She was started on Keppra 500 mg twice daily, tolerating it well,  I personally reviewed MRI of the brain without contrast June 14, 2018: No acute abnormality, moderate supratentorium small vessel disease.  Laboratory evaluations January 2020 showed TSH, CMP, CBC, A1c was 6.0,  UPDATE May 05 2019: She was admitted to the hospital on April 26, 2019, had sudden onset loss of consciousness, patient has not been compliant with her medications, but since ED visit, her family was able to convince her to take her medication, she has no recurrent spells  I personally reviewed CT head without contrast April 26, 2019, no acute abnormality, mild supratentorium small vessel disease  Laboratory evaluation showed normal CBC, hepatic function, BMP showed K 3.2  REVIEW OF SYSTEMS: Out of a complete 14 system review of symptoms, the patient complains only of the following symptoms, and all other reviewed systems are negative.  Memory loss, seizures  ALLERGIES: Allergies  Allergen Reactions  . Tramadol Other (See Comments)    CHANGES IN MEMORY    HOME MEDICATIONS: Outpatient Medications Prior to Visit  Medication Sig Dispense Refill  . levETIRAcetam (KEPPRA XR) 500 MG 24 hr tablet Take 2 tablets (1,000 mg total) by mouth at bedtime. 60 tablet 11  . levothyroxine (SYNTHROID) 88 MCG tablet TAKE 1 TABLET BY MOUTH ONCE A DAY (Patient taking differently: Take 88 mcg by mouth daily. ) 30 tablet 3  . nystatin cream (MYCOSTATIN) APPLY 1 APPLICATION TOPICALLY DAILY  AS NEEDED UNDER BELLY RASH 30 g 1  . omeprazole (PRILOSEC) 40 MG capsule TAKE 1 CAPSULE BY MOUTH ONCE DAILY (Patient taking differently: Take 40 mg  by mouth daily. ) 90 capsule 3  . OVER THE COUNTER MEDICATION Take 2 tablets by mouth 2 (two) times daily as needed (pain). "Back and Body" over the counter    . QUEtiapine (SEROQUEL) 25 MG tablet Take 1 tablet (25 mg total) by mouth at bedtime. 30 tablet 5  . sertraline (ZOLOFT) 50 MG tablet TAKE 1 TABLET BY MOUTH ONCE DAILY (Patient taking differently: Take 50 mg by mouth daily. ) 30 tablet 2   No facility-administered medications prior to visit.     PAST MEDICAL HISTORY: Past Medical History:  Diagnosis Date  . Anxiety   . Arthritis    ?OA vs Rheum (established with Dr. Jefm Bryant pending blood work)  . Articular cartilage disorder of shoulder 04/2012   right  . Back pain    h/o DDD since 2010  . Basal cell carcinoma    right dorsum nose, right forhead  . Dementia ([place])   . Dementia ([place])   . Dental crowns present    also dental caps  . Diabetes mellitus    NIDDM  . Fecal incontinence   . GERD (gastroesophageal reflux disease)   . Headache(784.0)    occasional  . History of skin cancer   . Hyperlipidemia    no current med.  . Hypertension   . Hyperthyroidism   . Hypothyroid   . Impingement syndrome of right shoulder 04/2012  . Memory impairment    dementia- worsenign 12/2016 with medication non compliance and delusions.    . Orthostatic dizziness   . Right rotator cuff tear 05/15/2012  . Rotator cuff rupture 04/2012   right  . Seizures ([place])   . 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 Von Willebrand disease ([place])     PAST SURGICAL HISTORY: Past Surgical History:  Procedure Laterality Date  . APPENDECTOMY    . BREAST SURGERY     hematoma evacuation due to trauma  . CARDIAC CATHETERIZATION  03/06/2001  . CESAREAN SECTION     x 2  . COLONOSCOPY  01/20/2006 per patient  . FRACTURE SURGERY    . KNEE ARTHROSCOPY Right 12/01/2014   Procedure: ARTHROSCOPY KNEE,partial medial menisectomy and plica excision;  Surgeon: Hessie Knows, MD;   Location: [facility] ORS;  Service: Orthopedics;  Laterality: Right;  . LUMBAR LAMINECTOMY/DECOMPRESSION MICRODISCECTOMY  03/01/2009   right L4-5; arthrodesis L4-5  . ORIF WRIST FRACTURE     left  . SHOULDER ARTHROSCOPY WITH ROTATOR CUFF REPAIR AND SUBACROMIAL DECOMPRESSION  05/15/2012   Procedure: SHOULDER ARTHROSCOPY WITH ROTATOR CUFF REPAIR AND SUBACROMIAL DECOMPRESSION;  Surgeon: Johnny Bridge, MD;  Location: [facility];  Service: Orthopedics;  Laterality: Right;  RIGHT ARTHROSCOPY SHOULDER DEBRIDEMENT LIMITED, ARTHROSCOPY SHOULDER DECOMPRESSION SUBACROMIAL PARTIAL ACROMIOPLASTY WITH CORACOACROMIAL RELEASE, ARTHROSCOPY SHOULDER WITH ROTATOR CUFF REPAIR  . TUBAL LIGATION      FAMILY HISTORY: Family History  Problem Relation Age of Onset  . Dementia Mother   . Arthritis Mother   . Cancer Father        unknown primary  . Heart disease Father   . Thyroid disease Sister   . Cancer Sister        brain tumor  . Dementia Sister   . COPD Brother   . Cancer Brother  bone cancer  . Colon cancer Paternal Grandfather   . Breast cancer Neg Hx     SOCIAL HISTORY: Social History   Socioeconomic History  . Marital status: Married    Spouse name: Trilby Drummer  . Number of children: 2  . Years of education: 54  . Highest education level: Not on file  Occupational History  . Occupation: Retired    Comment: retired  Scientific laboratory technician  . Financial resource strain: Not on file  . Food insecurity    Worry: Not on file    Inability: Not on file  . Transportation needs    Medical: Not on file    Non-medical: Not on file  Tobacco Use  . Smoking status: Never Smoker  . Smokeless tobacco: Never Used  Substance and Sexual Activity  . Alcohol use: No    Alcohol/week: 0.0 standard drinks  . Drug use: No  . Sexual activity: Not Currently    Birth control/protection: Surgical  Lifestyle  . Physical activity    Days per week: Not on file    Minutes per session: Not on file  .  Stress: Not on file  Relationships  . Social Herbalist on phone: Not on file    Gets together: Not on file    Attends religious service: Not on file    Active member of club or organization: Not on file    Attends meetings of clubs or organizations: Not on file    Relationship status: Not on file  . Intimate partner violence    Fear of current or ex partner: Not on file    Emotionally abused: Not on file    Physically abused: Not on file    Forced sexual activity: Not on file  Other Topics Concern  . Not on file  Social History Narrative   Patient lives at home with her husband Trilby Drummer).   Patient is retired .   Education high school.   Right handed.   Caffeine sweet tea two cups.       PHYSICAL EXAM  Vitals:   05/05/19 1540  BP: 129/82  Pulse: 74  Temp: (!) 97.5 F (36.4 C)  Weight: 129 lb 6.4 oz (58.7 kg)  Height: 5\' 3"  (1.6 m)   Body mass index is 22.92 kg/m.  Generalized: Well developed, in no acute distress  MMSE - Mini Mental State Exam 03/02/2019 11/30/2018 07/29/2018  Orientation to time 0 0 1  Orientation to Place 1 1 3   Registration 0 3 3  Attention/ Calculation 0 0 0  Recall 0 2 0  Language- name 2 objects 1 1 2   Language- repeat 1 1 1   Language- follow 3 step command 0 1 3  Language- read & follow direction 0 0 1  Write a sentence 0 0 1  Copy design 0 0 0  Copy design-comments - couldnt name any animals -  Total score 3 9 15     Neurological examination  Mentation: Alert, history provided by husband. Follows most exam commands,  Cranial nerve II-XII: Pupils were equal round reactive to light. Extraocular movements were full, visual field were full on confrontational test. Facial sensation and strength were normal. Head turning and shoulder shrug  were normal and symmetric. Motor: The motor testing reveals 5 over 5 strength of all 4 extremities. Good symmetric motor tone is noted throughout.  Sensory: Sensory testing is intact to soft touch  on all 4 extremities. No evidence of extinction is noted.  Coordination: Cerebellar testing reveals good finger-nose-finger bilaterally Gait and station: Gait is mildly wide-based, good stride, able to rise from seated position without pushoff Reflexes: Deep tendon reflexes are symmetric and normal bilaterally.   DIAGNOSTIC DATA (LABS, IMAGING, TESTING) - I reviewed patient records, labs, notes, testing and imaging myself where available.  Lab Results  Component Value Date   WBC 5.9 04/26/2019   HGB 12.9 04/26/2019   HCT 40.7 04/26/2019   MCV 87.9 04/26/2019   PLT 200 04/26/2019      Component Value Date/Time   NA 144 04/26/2019 1339   K 3.2 (L) 04/26/2019 1339   CL 106 04/26/2019 1339   CO2 27 04/26/2019 1339   GLUCOSE 131 (H) 04/26/2019 1339   BUN 12 04/26/2019 1339   CREATININE 0.90 04/26/2019 1339   CREATININE 0.92 06/25/2018 1513   CALCIUM 9.5 04/26/2019 1339   PROT 7.3 04/12/2019 1940   ALBUMIN 4.4 04/12/2019 1940   AST 26 04/12/2019 1940   ALT 15 04/12/2019 1940   ALKPHOS 72 04/12/2019 1940   BILITOT 0.3 04/12/2019 1940   GFRNONAA >60 04/26/2019 1339   GFRNONAA 63 06/25/2018 1513   GFRAA >60 04/26/2019 1339   GFRAA 73 06/25/2018 1513   Lab Results  Component Value Date   CHOL 234 (H) 05/18/2015   HDL 57 05/18/2015   LDLCALC 137 (H) 05/18/2015   LDLDIRECT 164.0 07/08/2014   TRIG 199 (H) 05/18/2015   CHOLHDL 4.1 05/18/2015   Lab Results  Component Value Date   HGBA1C 6.0 (H) 06/25/2018   Lab Results  Component Value Date   VITAMINB12 301 10/27/2014   Lab Results  Component Value Date   TSH 4.17 06/25/2018    ASSESSMENT AND PLAN 73 y.o. year old female   1.  Dementia -Progressive, more agitation, emotional outbursts at home occasionbally, was able to follow most exam commands Keep Seroquel 25 mg at bedtime for agitation, zoloft 50mg  daily  2.  Seizures -Recurrent seizure 04/26/2019 in the setting of medication noncompliance Doing better now with  Keppra xr 500mg  2 tabs qhs.  Marcial Pacas, M.D. Ph.D.  [facility] [place], [place] [address] Phone: [phone] Fax:      [phone]

## 2019-07-05 ENCOUNTER — Other Ambulatory Visit: Payer: Self-pay

## 2019-07-05 ENCOUNTER — Encounter: Payer: Self-pay | Admitting: Neurology

## 2019-07-05 ENCOUNTER — Ambulatory Visit (INDEPENDENT_AMBULATORY_CARE_PROVIDER_SITE_OTHER): Payer: PPO | Admitting: Neurology

## 2019-07-05 VITALS — BP 132/78 | HR 76 | Temp 96.9°F | Ht 63.0 in | Wt 116.5 lb

## 2019-07-05 DIAGNOSIS — R569 Unspecified convulsions: Secondary | ICD-10-CM | POA: Diagnosis not present

## 2019-07-05 DIAGNOSIS — F0390 Unspecified dementia without behavioral disturbance: Secondary | ICD-10-CM | POA: Diagnosis not present

## 2019-07-05 NOTE — Progress Notes (Signed)
PATIENT: Stacy Davis DOB: [birth year]  REASON FOR VISIT: follow up HISTORY FROM: patient  HISTORY OF PRESENT ILLNESS: Today 07/05/19  HISTORY  Today 11/30/18  HISTORY Stacy Davis a 74 years old female, seen in request byher primary care physician Dr.Pickard, [place] evaluation[place] worsening dementia, seizure, She is with her husband Trilby Drummer at today's visit on July 29, 2018.  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  I reviewed hospital discharge on June 16, 2018,shewas admitted for collapse, similar presentation in August 2019, according to husband, total of 3 episodes, sudden loss of consciousness,generalized tonic-clonic seizure,suffered a fall, first episode in early 2019, was walking around the bed suddenly collapsed with drooling from the mouth jerking movement in all extremities, no bowel and bladder incontinence, second episode in August, collapsed to the kitchen floor, husband found her jerking, extremity movement, third episode 2 AM on June 15, 2018, was found on the edge of the bed, with seizure activities.patient appearedto have  breathing difficulty, required bagging from paramedic, confused, then she was able to back to her baseline, she had no recollection of the fall,   EEG is consistent with an area of potential epileptogenicity in the right frontotemporal region. There was no seizure recorded on this study.  She was started on Keppra 500 mg twice daily, tolerating it well,  I personally reviewed MRI of the brain without contrast June 14, 2018: No acute abnormality, moderate supratentorium small vessel disease.  Laboratory evaluations January 2020 showed TSH, CMP, CBC, A1c was 6.0,  UPDATE May 05 2019: She was admitted to the hospital on April 26, 2019, had sudden onset loss of consciousness, patient has not been compliant with her medications, but since ED visit, her family was able to convince her to take her medication, she has no recurrent spells  I personally reviewed CT head without contrast April 26, 2019, no acute abnormality, mild supratentorium small vessel disease  Laboratory evaluation showed normal CBC, hepatic function, BMP showed K 3.2  UPDATE Jul 05 2019: She has no recurrent seizure, tolerating Keppra XR 500 mg 2 tablets at bedtime, also Seroquel 25 mg at bedtime, she sleeps well, but no longer has good appetite, walks in her house all day long, denies significant gait abnormality, she is not taking Zoloft regularly   Mini-Mental Status Examination was only 3 out of 30 in October 2020 REVIEW OF SYSTEMS: Out of a complete 14 system review of symptoms, the patient complains only of the following symptoms, and all other reviewed systems are negative.  Memory loss, seizures  ALLERGIES: Allergies  Allergen Reactions  . Tramadol Other (See Comments)    CHANGES IN MEMORY    HOME MEDICATIONS: Outpatient Medications Prior to Visit  Medication  Sig Dispense Refill  . levETIRAcetam (KEPPRA XR) 500 MG 24 hr tablet Take 2 tablets (1,000 mg total) by mouth at bedtime. 180 tablet 4  .  levothyroxine (SYNTHROID) 88 MCG tablet TAKE 1 TABLET BY MOUTH ONCE A DAY (Patient taking differently: Take 88 mcg by mouth daily. ) 30 tablet 3  . nystatin cream (MYCOSTATIN) APPLY 1 APPLICATION TOPICALLY DAILY AS NEEDED UNDER BELLY RASH 30 g 1  . omeprazole (PRILOSEC) 40 MG capsule TAKE 1 CAPSULE BY MOUTH ONCE DAILY (Patient taking differently: Take 40 mg by mouth daily. ) 90 capsule 3  . OVER THE COUNTER MEDICATION Take 2 tablets by mouth 2 (two) times daily as needed (pain). "Back and Body" over the counter    . QUEtiapine (SEROQUEL) 25 MG tablet Take 1 tablet (25 mg total) by mouth at bedtime. 90 tablet 4  . sertraline (ZOLOFT) 50 MG tablet Take 1 tablet (50 mg total) by mouth daily. 90 tablet 4   No facility-administered medications prior to visit.    PAST MEDICAL HISTORY: Past Medical History:  Diagnosis Date  . Anxiety   . Arthritis    ?OA vs Rheum (established with Dr. Jefm Bryant pending blood work)  . Articular cartilage disorder of shoulder 04/2012   right  . Back pain    h/o DDD since 2010  . Basal cell carcinoma    right dorsum nose, right forhead  . Dementia ([place])   . Dementia ([place])   . Dental crowns present    also dental caps  . Diabetes mellitus    NIDDM  . Fecal incontinence   . GERD (gastroesophageal reflux disease)   . Headache(784.0)    occasional  . History of skin cancer   . Hyperlipidemia    no current med.  . Hypertension   . Hyperthyroidism   . Hypothyroid   . Impingement syndrome of right shoulder 04/2012  . Memory impairment    dementia- worsenign 12/2016 with medication non compliance and delusions.    . Orthostatic dizziness   . Right rotator cuff tear 05/15/2012  . Rotator cuff rupture 04/2012   right  . Seizures ([place])   . 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 Von Willebrand disease ([place])     PAST SURGICAL HISTORY: Past Surgical History:  Procedure Laterality Date  . APPENDECTOMY    . BREAST SURGERY      hematoma evacuation due to trauma  . CARDIAC CATHETERIZATION  03/06/2001  . CESAREAN SECTION     x 2  . COLONOSCOPY  01/20/2006 per patient  . FRACTURE SURGERY    . KNEE ARTHROSCOPY Right 12/01/2014   Procedure: ARTHROSCOPY KNEE,partial medial menisectomy and plica excision;  Surgeon: Hessie Knows, MD;  Location: [facility];  Service: Orthopedics;  Laterality: Right;  . LUMBAR LAMINECTOMY/DECOMPRESSION MICRODISCECTOMY  03/01/2009   right L4-5; arthrodesis L4-5  . ORIF WRIST FRACTURE     left  . SHOULDER ARTHROSCOPY WITH ROTATOR CUFF REPAIR AND SUBACROMIAL DECOMPRESSION  05/15/2012   Procedure: SHOULDER ARTHROSCOPY WITH ROTATOR CUFF REPAIR AND SUBACROMIAL DECOMPRESSION;  Surgeon: Johnny Bridge, MD;  Location: [place];  Service: Orthopedics;  Laterality: Right;  RIGHT ARTHROSCOPY SHOULDER DEBRIDEMENT LIMITED, ARTHROSCOPY SHOULDER DECOMPRESSION SUBACROMIAL PARTIAL ACROMIOPLASTY WITH CORACOACROMIAL RELEASE, ARTHROSCOPY SHOULDER WITH ROTATOR CUFF REPAIR  . TUBAL LIGATION      FAMILY HISTORY: Family History  Problem Relation Age of Onset  . Dementia Mother   . Arthritis Mother   . Cancer Father  unknown primary  . Heart disease Father   . Thyroid disease Sister   . Cancer Sister        brain tumor  . Dementia Sister   . COPD Brother   . Cancer Brother        bone cancer  . Colon cancer Paternal Grandfather   . Breast cancer Neg Hx     SOCIAL HISTORY: Social History   Socioeconomic History  . Marital status: Married    Spouse name: Trilby Drummer  . Number of children: 2  . Years of education: 76  . Highest education level: Not on file  Occupational History  . Occupation: Retired    Comment: retired  Tobacco Use  . Smoking status: Never Smoker  . Smokeless tobacco: Never Used  Substance and Sexual Activity  . Alcohol use: No    Alcohol/week: 0.0 standard drinks  . Drug use: No  . Sexual activity: Not Currently    Birth control/protection: Surgical  Other  Topics Concern  . Not on file  Social History Narrative   Patient lives at home with her husband Trilby Drummer).   Patient is retired .   Education high school.   Right handed.   Caffeine sweet tea two cups.      Social Determinants of Health   Financial Resource Strain:   . Difficulty of Paying Living Expenses: Not on file  Food Insecurity:   . Worried About Charity fundraiser in the Last Year: Not on file  . Ran Out of Food in the Last Year: Not on file  Transportation Needs:   . Lack of Transportation (Medical): Not on file  . Lack of Transportation (Non-Medical): Not on file  Physical Activity:   . Days of Exercise per Week: Not on file  . Minutes of Exercise per Session: Not on file  Stress:   . Feeling of Stress : Not on file  Social Connections:   . Frequency of Communication with Friends and Family: Not on file  . Frequency of Social Gatherings with Friends and Family: Not on file  . Attends Religious Services: Not on file  . Active Member of Clubs or Organizations: Not on file  . Attends Archivist Meetings: Not on file  . Marital Status: Not on file  Intimate Partner Violence:   . Fear of Current or Ex-Partner: Not on file  . Emotionally Abused: Not on file  . Physically Abused: Not on file  . Sexually Abused: Not on file    PHYSICAL EXAM  Vitals:   07/05/19 1433  BP: 132/78  Pulse: 76  Temp: (!) 96.9 F (36.1 C)  Weight: 116 lb 8 oz (52.8 kg)  Height: 5\' 3"  (1.6 m)   Body mass index is 20.64 kg/m.  Generalized: Well developed, in no acute distress  MMSE - Mini Mental State Exam 03/02/2019 11/30/2018 07/29/2018  Orientation to time 0 0 1  Orientation to Place 1 1 3   Registration 0 3 3  Attention/ Calculation 0 0 0  Recall 0 2 0  Language- name 2 objects 1 1 2   Language- repeat 1 1 1   Language- follow 3 step command 0 1 3  Language- read & follow direction 0 0 1  Write a sentence 0 0 1  Copy design 0 0 0  Copy design-comments - couldnt name  any animals -  Total score 3 9 15     Neurological examination  Mentation: Alert, history provided by husband. Follows one-step command Cranial  nerve II-XII: Pupils were equal round reactive to light. Extraocular movements were full, visual field were full on confrontational test. Facial sensation and strength were normal. Head turning and shoulder shrug  were normal and symmetric. Motor: Moving all 4 extremities without difficulty Coordination: No truncal or limb ataxia noted, Gait and station: raising up from seated position by pushing on chair arm, stable,  DIAGNOSTIC DATA (LABS, IMAGING, TESTING) - I reviewed patient records, labs, notes, testing and imaging myself where available.  Lab Results  Component Value Date   WBC 5.9 04/26/2019   HGB 12.9 04/26/2019   HCT 40.7 04/26/2019   MCV 87.9 04/26/2019   PLT 200 04/26/2019      Component Value Date/Time   NA 144 04/26/2019 1339   K 3.2 (L) 04/26/2019 1339   CL 106 04/26/2019 1339   CO2 27 04/26/2019 1339   GLUCOSE 131 (H) 04/26/2019 1339   BUN 12 04/26/2019 1339   CREATININE 0.90 04/26/2019 1339   CREATININE 0.92 06/25/2018 1513   CALCIUM 9.5 04/26/2019 1339   PROT 7.3 04/12/2019 1940   ALBUMIN 4.4 04/12/2019 1940   AST 26 04/12/2019 1940   ALT 15 04/12/2019 1940   ALKPHOS 72 04/12/2019 1940   BILITOT 0.3 04/12/2019 1940   GFRNONAA >60 04/26/2019 1339   GFRNONAA 63 06/25/2018 1513   GFRAA >60 04/26/2019 1339   GFRAA 73 06/25/2018 1513   Lab Results  Component Value Date   CHOL 234 (H) 05/18/2015   HDL 57 05/18/2015   LDLCALC 137 (H) 05/18/2015   LDLDIRECT 164.0 07/08/2014   TRIG 199 (H) 05/18/2015   CHOLHDL 4.1 05/18/2015   Lab Results  Component Value Date   HGBA1C 6.0 (H) 06/25/2018   Lab Results  Component Value Date   VITAMINB12 301 10/27/2014   Lab Results  Component Value Date   TSH 4.17 06/25/2018    ASSESSMENT AND PLAN 74 y.o. year old female    Dementia  Progressively getting worse, keep  Seroquel 25 mg every day, I have suggested Remeron for better appetite, has been wants to hold off on that, she is also on Zoloft 50 mg daily, but not taking it regularly  Seizures  Recurrent seizure on 04/26/2019 in the setting of medication noncompliance  Keep  Keppra xr 500mg  2 tabs qhs.  Return to clinic in 1 year with nurse practitioner Thea Alken, M.D. Ph.D.  [facility] [place], [place] [address] Phone: [phone] Fax:      [phone]

## 2019-09-02 DIAGNOSIS — Z20822 Contact with and (suspected) exposure to covid-19: Secondary | ICD-10-CM | POA: Diagnosis not present

## 2019-09-02 DIAGNOSIS — F0390 Unspecified dementia without behavioral disturbance: Secondary | ICD-10-CM | POA: Diagnosis not present

## 2019-09-02 DIAGNOSIS — Z111 Encounter for screening for respiratory tuberculosis: Secondary | ICD-10-CM | POA: Diagnosis not present

## 2019-09-08 DIAGNOSIS — E038 Other specified hypothyroidism: Secondary | ICD-10-CM | POA: Diagnosis not present

## 2019-09-08 DIAGNOSIS — F0390 Unspecified dementia without behavioral disturbance: Secondary | ICD-10-CM | POA: Diagnosis not present

## 2019-09-08 DIAGNOSIS — E119 Type 2 diabetes mellitus without complications: Secondary | ICD-10-CM | POA: Diagnosis not present

## 2019-09-08 DIAGNOSIS — M6281 Muscle weakness (generalized): Secondary | ICD-10-CM | POA: Diagnosis not present

## 2019-09-08 DIAGNOSIS — G4089 Other seizures: Secondary | ICD-10-CM | POA: Diagnosis not present

## 2019-09-08 DIAGNOSIS — K219 Gastro-esophageal reflux disease without esophagitis: Secondary | ICD-10-CM | POA: Diagnosis not present

## 2019-09-08 DIAGNOSIS — I10 Essential (primary) hypertension: Secondary | ICD-10-CM | POA: Diagnosis not present

## 2019-09-08 DIAGNOSIS — E783 Hyperchylomicronemia: Secondary | ICD-10-CM | POA: Diagnosis not present

## 2019-09-16 DIAGNOSIS — Z13220 Encounter for screening for lipoid disorders: Secondary | ICD-10-CM | POA: Diagnosis not present

## 2019-09-16 DIAGNOSIS — Z131 Encounter for screening for diabetes mellitus: Secondary | ICD-10-CM | POA: Diagnosis not present

## 2019-09-16 DIAGNOSIS — Z130 Encounter for screening for diseases of the blood and blood-forming organs and certain disorders involving the immune mechanism: Secondary | ICD-10-CM | POA: Diagnosis not present

## 2019-09-16 DIAGNOSIS — Z1389 Encounter for screening for other disorder: Secondary | ICD-10-CM | POA: Diagnosis not present

## 2019-09-30 DIAGNOSIS — E876 Hypokalemia: Secondary | ICD-10-CM | POA: Diagnosis not present

## 2019-10-07 DIAGNOSIS — F419 Anxiety disorder, unspecified: Secondary | ICD-10-CM | POA: Diagnosis not present

## 2019-10-07 DIAGNOSIS — R451 Restlessness and agitation: Secondary | ICD-10-CM | POA: Diagnosis not present

## 2019-10-07 DIAGNOSIS — F5102 Adjustment insomnia: Secondary | ICD-10-CM | POA: Diagnosis not present

## 2019-10-07 DIAGNOSIS — F0391 Unspecified dementia with behavioral disturbance: Secondary | ICD-10-CM | POA: Diagnosis not present

## 2019-10-07 DIAGNOSIS — R454 Irritability and anger: Secondary | ICD-10-CM | POA: Diagnosis not present

## 2019-10-13 DIAGNOSIS — F329 Major depressive disorder, single episode, unspecified: Secondary | ICD-10-CM | POA: Diagnosis not present

## 2019-10-13 DIAGNOSIS — W19XXXA Unspecified fall, initial encounter: Secondary | ICD-10-CM | POA: Diagnosis not present

## 2019-10-13 DIAGNOSIS — M6281 Muscle weakness (generalized): Secondary | ICD-10-CM | POA: Diagnosis not present

## 2019-10-13 DIAGNOSIS — F0390 Unspecified dementia without behavioral disturbance: Secondary | ICD-10-CM | POA: Diagnosis not present

## 2019-10-19 DIAGNOSIS — E039 Hypothyroidism, unspecified: Secondary | ICD-10-CM | POA: Diagnosis not present

## 2019-10-19 DIAGNOSIS — G309 Alzheimer's disease, unspecified: Secondary | ICD-10-CM | POA: Diagnosis not present

## 2019-10-19 DIAGNOSIS — R32 Unspecified urinary incontinence: Secondary | ICD-10-CM | POA: Diagnosis not present

## 2019-10-19 DIAGNOSIS — E7800 Pure hypercholesterolemia, unspecified: Secondary | ICD-10-CM | POA: Diagnosis not present

## 2019-10-19 DIAGNOSIS — K219 Gastro-esophageal reflux disease without esophagitis: Secondary | ICD-10-CM | POA: Diagnosis not present

## 2019-10-19 DIAGNOSIS — I10 Essential (primary) hypertension: Secondary | ICD-10-CM | POA: Diagnosis not present

## 2019-10-19 DIAGNOSIS — F0280 Dementia in other diseases classified elsewhere without behavioral disturbance: Secondary | ICD-10-CM | POA: Diagnosis not present

## 2019-10-19 DIAGNOSIS — E119 Type 2 diabetes mellitus without complications: Secondary | ICD-10-CM | POA: Diagnosis not present

## 2019-10-19 DIAGNOSIS — Z9181 History of falling: Secondary | ICD-10-CM | POA: Diagnosis not present

## 2019-10-19 DIAGNOSIS — R569 Unspecified convulsions: Secondary | ICD-10-CM | POA: Diagnosis not present

## 2019-10-22 ENCOUNTER — Encounter (HOSPITAL_COMMUNITY): Payer: Self-pay | Admitting: *Deleted

## 2019-10-22 ENCOUNTER — Emergency Department (HOSPITAL_COMMUNITY)
Admission: EM | Admit: 2019-10-22 | Discharge: 2019-10-22 | Disposition: A | Discharge status: Skilled Nursing Facility | Payer: PPO | Attending: Emergency Medicine | Admitting: Emergency Medicine

## 2019-10-22 DIAGNOSIS — E876 Hypokalemia: Secondary | ICD-10-CM | POA: Diagnosis not present

## 2019-10-22 DIAGNOSIS — E119 Type 2 diabetes mellitus without complications: Secondary | ICD-10-CM | POA: Diagnosis not present

## 2019-10-22 DIAGNOSIS — R569 Unspecified convulsions: Secondary | ICD-10-CM | POA: Insufficient documentation

## 2019-10-22 DIAGNOSIS — M2550 Pain in unspecified joint: Secondary | ICD-10-CM | POA: Diagnosis not present

## 2019-10-22 DIAGNOSIS — R5381 Other malaise: Secondary | ICD-10-CM | POA: Diagnosis not present

## 2019-10-22 DIAGNOSIS — Z7984 Long term (current) use of oral hypoglycemic drugs: Secondary | ICD-10-CM | POA: Insufficient documentation

## 2019-10-22 DIAGNOSIS — W19XXXA Unspecified fall, initial encounter: Secondary | ICD-10-CM | POA: Diagnosis not present

## 2019-10-22 DIAGNOSIS — Z7401 Bed confinement status: Secondary | ICD-10-CM | POA: Diagnosis not present

## 2019-10-22 DIAGNOSIS — I10 Essential (primary) hypertension: Secondary | ICD-10-CM | POA: Diagnosis not present

## 2019-10-22 DIAGNOSIS — G40909 Epilepsy, unspecified, not intractable, without status epilepticus: Secondary | ICD-10-CM | POA: Diagnosis not present

## 2019-10-22 DIAGNOSIS — R0902 Hypoxemia: Secondary | ICD-10-CM | POA: Diagnosis not present

## 2019-10-22 DIAGNOSIS — Z79899 Other long term (current) drug therapy: Secondary | ICD-10-CM | POA: Diagnosis not present

## 2019-10-22 DIAGNOSIS — R410 Disorientation, unspecified: Secondary | ICD-10-CM | POA: Diagnosis not present

## 2019-10-22 LAB — CBC WITH DIFFERENTIAL/PLATELET
Abs Immature Granulocytes: 0.01 10*3/uL (ref 0.00–0.07)
Basophils Absolute: 0 10*3/uL (ref 0.0–0.1)
Basophils Relative: 0 %
Eosinophils Absolute: 0 10*3/uL (ref 0.0–0.5)
Eosinophils Relative: 0 %
HCT: 41.9 % (ref 36.0–46.0)
Hemoglobin: 13.6 g/dL (ref 12.0–15.0)
Immature Granulocytes: 0 %
Lymphocytes Relative: 15 %
Lymphs Abs: 0.9 10*3/uL (ref 0.7–4.0)
MCH: 28.9 pg (ref 26.0–34.0)
MCHC: 32.5 g/dL (ref 30.0–36.0)
MCV: 89.1 fL (ref 80.0–100.0)
Monocytes Absolute: 0.3 10*3/uL (ref 0.1–1.0)
Monocytes Relative: 6 %
Neutro Abs: 4.6 10*3/uL (ref 1.7–7.7)
Neutrophils Relative %: 79 %
Platelets: 220 10*3/uL (ref 150–400)
RBC: 4.7 MIL/uL (ref 3.87–5.11)
RDW: 14.6 % (ref 11.5–15.5)
WBC: 5.9 10*3/uL (ref 4.0–10.5)
nRBC: 0 % (ref 0.0–0.2)

## 2019-10-22 LAB — COMPREHENSIVE METABOLIC PANEL
ALT: 27 U/L (ref 0–44)
AST: 35 U/L (ref 15–41)
Albumin: 4.5 g/dL (ref 3.5–5.0)
Alkaline Phosphatase: 119 U/L (ref 38–126)
Anion gap: 12 (ref 5–15)
BUN: 19 mg/dL (ref 8–23)
CO2: 28 mmol/L (ref 22–32)
Calcium: 9.1 mg/dL (ref 8.9–10.3)
Chloride: 103 mmol/L (ref 98–111)
Creatinine, Ser: 0.78 mg/dL (ref 0.44–1.00)
GFR calc Af Amer: >60 mL/min (ref >60)
GFR calc non Af Amer: >60 mL/min (ref >60)
Glucose, Bld: 142 mg/dL — ABNORMAL HIGH (ref 70–99)
Potassium: 2.9 mmol/L — ABNORMAL LOW (ref 3.5–5.1)
Sodium: 143 mmol/L (ref 135–145)
Total Bilirubin: 0.4 mg/dL (ref 0.3–1.2)
Total Protein: 7.2 g/dL (ref 6.5–8.1)

## 2019-10-22 LAB — CBG MONITORING, ED
Glucose-Capillary: 133 mg/dL — ABNORMAL HIGH (ref 70–99)
Glucose-Capillary: 145 mg/dL — ABNORMAL HIGH (ref 70–99)

## 2019-10-22 LAB — MAGNESIUM: Magnesium: 2.2 mg/dL (ref 1.7–2.4)

## 2019-10-22 MED ORDER — LEVETIRACETAM IN NACL 1000 MG/100ML IV SOLN
1000.0000 mg | Freq: Once | INTRAVENOUS | Status: AC
  Administered 2019-10-22: 1000 mg via INTRAVENOUS
  Filled 2019-10-22: qty 100

## 2019-10-22 MED ORDER — POTASSIUM CHLORIDE CRYS ER 20 MEQ PO TBCR
20.0000 meq | EXTENDED_RELEASE_TABLET | Freq: Every day | ORAL | 0 refills | Status: DC
Start: 2019-10-22 — End: 2019-12-31

## 2019-10-22 MED ORDER — POTASSIUM CHLORIDE CRYS ER 20 MEQ PO TBCR
60.0000 meq | EXTENDED_RELEASE_TABLET | Freq: Once | ORAL | Status: AC
  Administered 2019-10-22: 60 meq via ORAL
  Filled 2019-10-22: qty 3

## 2019-10-22 NOTE — ED Triage Notes (Signed)
Per EMS, patient from [facility], staff reports witnessed seizure lasting 15-25 seconds. Patient lowered to ground. Post ictal upon EMS arrival. Hx dementia. Hx seizures. Takes keppra as prescribed.

## 2019-10-22 NOTE — Discharge Instructions (Addendum)
Get help right away if you: Have chest pain. Have shortness of breath. Have vomiting or diarrhea that lasts for more than 2 days. Faint.

## 2019-10-22 NOTE — ED Notes (Signed)
No answer when attempting to contact [facility] to let them know patient is discharged.

## 2019-10-22 NOTE — ED Provider Notes (Signed)
[facility] DEPT Provider Note   CSN: [id number] Arrival date & time: 10/22/19  1309     History Chief Complaint  Patient presents with  . Seizures    Stacy Davis is a 74 y.o. female who  has a past medical history of Anxiety, Arthritis, Articular cartilage disorder of shoulder (04/2012), Back pain, Basal cell carcinoma, Dementia ([place]), Dementia ([place]), Dental crowns present, Diabetes mellitus, Fecal incontinence, GERD (gastroesophageal reflux disease), Headache(784.0), History of skin cancer, Hyperlipidemia, Hypertension, Hyperthyroidism, Hypothyroid, Impingement syndrome of right shoulder (04/2012), Memory impairment, Orthostatic dizziness, Right rotator cuff tear (05/15/2012), Rotator cuff rupture (04/2012), Seizures ([place]), Squamous cell carcinoma in situ (11/04/2013), and Von Willebrand disease ([place]). She was BIB EMS from [facility] for  Witnessed seizure. The patient is unable to provide the hx. According to EMS the staff witnessed a seizure and let her safely to the ground . Seizure lasted about 20 sec. No recurrence. Patient sent to the ED for evaluation. She is on Keppra and is compliant.  HPI     Past Medical History:  Diagnosis Date  . Anxiety   . Arthritis    ?OA vs Rheum (established with Dr. Jefm Bryant pending blood work)  . Articular cartilage disorder of shoulder 04/2012   right  . Back pain    h/o DDD since 2010  . Basal cell carcinoma    right dorsum nose, right forhead  . Dementia ([place])   . Dementia ([place])   . Dental crowns present    also dental caps  . Diabetes mellitus    NIDDM  . Fecal incontinence   . GERD (gastroesophageal reflux disease)   . Headache(784.0)    occasional  . History of skin cancer   . Hyperlipidemia    no current med.  . Hypertension   . Hyperthyroidism   . Hypothyroid   . Impingement syndrome of right shoulder 04/2012  . Memory impairment    dementia- worsenign 12/2016 with medication non  compliance and delusions.    . Orthostatic dizziness   . Right rotator cuff tear 05/15/2012  . Rotator cuff rupture 04/2012   right  . Seizures ([place])   . 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 Von Willebrand disease [facility])     Patient Active Problem List   Diagnosis Date Noted  . Orthostatic dizziness   . Dementia ([place])   . Seizures ([place]) 06/16/2018  . Subarachnoid hemorrhage ([place])   . Syncope 04/11/2018  . Incontinence of feces 01/08/2018  . Depression 10/08/2017  . Memory impairment   . Advance care planning 10/06/2014  . Joint pain 04/15/2013  . Advance directive on file 08/12/2012  . Confusion 06/10/2012  . Right rotator cuff tear 05/15/2012  . Shoulder pain 05/03/2012  . Insomnia 08/13/2011  . 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 HTN (hypertension) 07/18/2011  . HLD (hyperlipidemia) 07/18/2011  . Memory loss 07/18/2011  . Hypothyroid 07/18/2011  . SVT (supraventricular tachycardia) ([place]) 07/18/2011  . Von Willebrand disease ([place]) 07/18/2011  . GERD (gastroesophageal reflux disease) 07/18/2011  . Back pain 07/18/2011  . Fatty liver 07/18/2011    Past Surgical History:  Procedure Laterality Date  . APPENDECTOMY    . BREAST SURGERY     hematoma evacuation due to trauma  . CARDIAC CATHETERIZATION  03/06/2001  . CESAREAN SECTION     x 2  . COLONOSCOPY  01/20/2006 per patient  . FRACTURE SURGERY    .  KNEE ARTHROSCOPY Right 12/01/2014   Procedure: ARTHROSCOPY KNEE,partial medial menisectomy and plica excision;  Surgeon: Hessie Knows, MD;  Location: [facility] OR[facility];  Service: Orthopedics;  Laterality: Right;  . LUMBAR LAMINECTOMY/DECOMPRESSION MICRODISCECTOMY  03/01/2009   right L4-5; arthrodesis L4-5  . ORIF WRIST FRACTURE     left  . SHOULDER ARTHROSCOPY WITH ROTATOR CUFF REPAIR AND SUBACROMIAL DECOMPRESSION  05/15/2012   Procedure: SHOULDER ARTHROSCOPY WITH ROTATOR CUFF REPAIR AND SUBACROMIAL  DECOMPRESSION;  Surgeon: Johnny Bridge, MD;  Location: [facility];  Service: Orthopedics;  Laterality: Right;  RIGHT ARTHROSCOPY SHOULDER DEBRIDEMENT LIMITED, ARTHROSCOPY SHOULDER DECOMPRESSION SUBACROMIAL PARTIAL ACROMIOPLASTY WITH CORACOACROMIAL RELEASE, ARTHROSCOPY SHOULDER WITH ROTATOR CUFF REPAIR  . TUBAL LIGATION       OB History   No obstetric history on file.     Family History  Problem Relation Age of Onset  . Dementia Mother   . Arthritis Mother   . Cancer Father        unknown primary  . Heart disease Father   . Thyroid disease Sister   . Cancer Sister        brain tumor  . Dementia Sister   . COPD Brother   . Cancer Brother        bone cancer  . Colon cancer Paternal Grandfather   . Breast cancer Neg Hx     Social History   Tobacco Use  . Smoking status: Never Smoker  . Smokeless tobacco: Never Used  Substance Use Topics  . Alcohol use: No    Alcohol/week: 0.0 standard drinks  . Drug use: No    Home Medications Prior to Admission medications   Medication Sig Start Date End Date Taking? Authorizing Provider  levETIRAcetam (KEPPRA XR) 500 MG 24 hr tablet Take 2 tablets (1,000 mg total) by mouth at bedtime. 05/05/19   Marcial Pacas, MD  levothyroxine (SYNTHROID) 88 MCG tablet TAKE 1 TABLET BY MOUTH ONCE A DAY Patient taking differently: Take 88 mcg by mouth daily.  10/05/18   Susy Frizzle, MD  nystatin cream (MYCOSTATIN) APPLY 1 APPLICATION TOPICALLY DAILY AS NEEDED UNDER BELLY RASH 04/19/19   Susy Frizzle, MD  omeprazole (PRILOSEC) 40 MG capsule TAKE 1 CAPSULE BY MOUTH ONCE DAILY Patient taking differently: Take 40 mg by mouth daily.  04/01/19   Susy Frizzle, MD  OVER THE COUNTER MEDICATION Take 2 tablets by mouth 2 (two) times daily as needed (pain). "Back and Body" over the counter    [provider]  QUEtiapine (SEROQUEL) 25 MG tablet Take 1 tablet (25 mg total) by mouth at bedtime. 05/05/19   Marcial Pacas, MD  sertraline  (ZOLOFT) 50 MG tablet Take 1 tablet (50 mg total) by mouth daily. 05/05/19   Marcial Pacas, MD    Allergies    Tramadol  Review of Systems   Review of Systems  Unable to perform ROS: Dementia    Physical Exam Updated Vital Signs BP 133/81   Pulse 76   Temp 98.6 F (37 C) (Oral)   Resp (!) 21   SpO2 100%   Physical Exam Vitals and nursing note reviewed.  Constitutional:      General: She is not in acute distress.    Appearance: She is well-developed. She is not diaphoretic.  HENT:     Head: Normocephalic and atraumatic.  Eyes:     General: No scleral icterus.    Conjunctiva/sclera: Conjunctivae normal.  Cardiovascular:     Rate and Rhythm: Normal rate  and regular rhythm.     Heart sounds: Normal heart sounds. No murmur. No friction rub. No gallop.   Pulmonary:     Effort: Pulmonary effort is normal. No respiratory distress.     Breath sounds: Normal breath sounds.  Abdominal:     General: Bowel sounds are normal. There is no distension.     Palpations: Abdomen is soft. There is no mass.     Tenderness: There is no abdominal tenderness. There is no guarding.  Musculoskeletal:     Cervical back: Normal range of motion.  Skin:    General: Skin is warm and dry.  Neurological:     Mental Status: She is alert.  Psychiatric:        Behavior: Behavior normal.     ED Results / Procedures / Treatments   Labs (all labs ordered are listed, but only abnormal results are displayed) Labs Reviewed  CBC WITH DIFFERENTIAL/PLATELET  COMPREHENSIVE METABOLIC PANEL  MAGNESIUM  CBG MONITORING, ED    EKG None  Radiology No results found.  Procedures Procedures (including critical care time)  Medications Ordered in ED Medications - No data to display  ED Course  I have reviewed the triage vital signs and the nursing notes.  Pertinent labs & imaging results that were available during my care of the patient were reviewed by me and considered in my medical decision making  (see chart for details).    MDM Rules/Calculators/A&P                      74 year old female here with a witnessed seizure.  She was let down to the ground, no evidence of trauma, no loss of bowel or bladder continence, no mouth injury.  Patient was worked up here in the emergency department.  She does appear to have hypokalemia.  I reviewed all labs.  Patient's potassium repleted orally and will be discharged with oral potassium over the next several days.  Given a loading dose of Keppra here in the emergency department, no repeat seizures.  She appears appropriate for discharge at this time.  EKG shows sinus rhythm at a rate of 79 with prolonged QT.  Seen and shared visit with Dr. Kathrynn Humble agrees with work-up and plan for discharge. Final Clinical Impression(s) / ED Diagnoses Final diagnoses:  Seizure ([place])  Hypokalemia    Rx / DC Orders ED Discharge Orders    None       Margarita Mail, PA-C 10/22/19 2108    Varney Biles, MD 10/23/19 1313

## 2019-10-24 DIAGNOSIS — Z9181 History of falling: Secondary | ICD-10-CM | POA: Diagnosis not present

## 2019-10-24 DIAGNOSIS — E119 Type 2 diabetes mellitus without complications: Secondary | ICD-10-CM | POA: Diagnosis not present

## 2019-10-24 DIAGNOSIS — G309 Alzheimer's disease, unspecified: Secondary | ICD-10-CM | POA: Diagnosis not present

## 2019-10-24 DIAGNOSIS — F0280 Dementia in other diseases classified elsewhere without behavioral disturbance: Secondary | ICD-10-CM | POA: Diagnosis not present

## 2019-10-24 DIAGNOSIS — K219 Gastro-esophageal reflux disease without esophagitis: Secondary | ICD-10-CM | POA: Diagnosis not present

## 2019-10-24 DIAGNOSIS — R569 Unspecified convulsions: Secondary | ICD-10-CM | POA: Diagnosis not present

## 2019-10-24 DIAGNOSIS — I10 Essential (primary) hypertension: Secondary | ICD-10-CM | POA: Diagnosis not present

## 2019-10-24 DIAGNOSIS — R32 Unspecified urinary incontinence: Secondary | ICD-10-CM | POA: Diagnosis not present

## 2019-10-24 DIAGNOSIS — E7800 Pure hypercholesterolemia, unspecified: Secondary | ICD-10-CM | POA: Diagnosis not present

## 2019-10-24 DIAGNOSIS — E039 Hypothyroidism, unspecified: Secondary | ICD-10-CM | POA: Diagnosis not present

## 2019-10-26 DIAGNOSIS — M79641 Pain in right hand: Secondary | ICD-10-CM | POA: Diagnosis not present

## 2019-10-29 DIAGNOSIS — K219 Gastro-esophageal reflux disease without esophagitis: Secondary | ICD-10-CM | POA: Diagnosis not present

## 2019-10-29 DIAGNOSIS — R569 Unspecified convulsions: Secondary | ICD-10-CM | POA: Diagnosis not present

## 2019-10-29 DIAGNOSIS — E119 Type 2 diabetes mellitus without complications: Secondary | ICD-10-CM | POA: Diagnosis not present

## 2019-10-29 DIAGNOSIS — G309 Alzheimer's disease, unspecified: Secondary | ICD-10-CM | POA: Diagnosis not present

## 2019-10-29 DIAGNOSIS — Z9181 History of falling: Secondary | ICD-10-CM | POA: Diagnosis not present

## 2019-10-29 DIAGNOSIS — E7800 Pure hypercholesterolemia, unspecified: Secondary | ICD-10-CM | POA: Diagnosis not present

## 2019-10-29 DIAGNOSIS — I10 Essential (primary) hypertension: Secondary | ICD-10-CM | POA: Diagnosis not present

## 2019-10-29 DIAGNOSIS — R32 Unspecified urinary incontinence: Secondary | ICD-10-CM | POA: Diagnosis not present

## 2019-10-29 DIAGNOSIS — F0280 Dementia in other diseases classified elsewhere without behavioral disturbance: Secondary | ICD-10-CM | POA: Diagnosis not present

## 2019-10-29 DIAGNOSIS — E039 Hypothyroidism, unspecified: Secondary | ICD-10-CM | POA: Diagnosis not present

## 2019-11-01 ENCOUNTER — Emergency Department (HOSPITAL_COMMUNITY): Payer: PPO

## 2019-11-01 ENCOUNTER — Emergency Department (HOSPITAL_COMMUNITY)
Admission: EM | Admit: 2019-11-01 | Discharge: 2019-11-01 | Disposition: A | Discharge status: Home / Self Care | Payer: PPO | Attending: Emergency Medicine | Admitting: Emergency Medicine

## 2019-11-01 ENCOUNTER — Encounter (HOSPITAL_COMMUNITY): Payer: Self-pay

## 2019-11-01 ENCOUNTER — Other Ambulatory Visit: Payer: Self-pay

## 2019-11-01 DIAGNOSIS — R569 Unspecified convulsions: Secondary | ICD-10-CM | POA: Insufficient documentation

## 2019-11-01 DIAGNOSIS — E119 Type 2 diabetes mellitus without complications: Secondary | ICD-10-CM | POA: Diagnosis not present

## 2019-11-01 DIAGNOSIS — I10 Essential (primary) hypertension: Secondary | ICD-10-CM | POA: Diagnosis not present

## 2019-11-01 DIAGNOSIS — R0902 Hypoxemia: Secondary | ICD-10-CM | POA: Diagnosis not present

## 2019-11-01 DIAGNOSIS — Z7401 Bed confinement status: Secondary | ICD-10-CM | POA: Diagnosis not present

## 2019-11-01 DIAGNOSIS — Z85828 Personal history of other malignant neoplasm of skin: Secondary | ICD-10-CM | POA: Insufficient documentation

## 2019-11-01 DIAGNOSIS — Z79899 Other long term (current) drug therapy: Secondary | ICD-10-CM | POA: Diagnosis not present

## 2019-11-01 DIAGNOSIS — F0390 Unspecified dementia without behavioral disturbance: Secondary | ICD-10-CM | POA: Insufficient documentation

## 2019-11-01 DIAGNOSIS — E039 Hypothyroidism, unspecified: Secondary | ICD-10-CM | POA: Diagnosis not present

## 2019-11-01 DIAGNOSIS — M19041 Primary osteoarthritis, right hand: Secondary | ICD-10-CM | POA: Diagnosis not present

## 2019-11-01 DIAGNOSIS — R410 Disorientation, unspecified: Secondary | ICD-10-CM | POA: Diagnosis not present

## 2019-11-01 DIAGNOSIS — G40909 Epilepsy, unspecified, not intractable, without status epilepticus: Secondary | ICD-10-CM | POA: Diagnosis not present

## 2019-11-01 DIAGNOSIS — R404 Transient alteration of awareness: Secondary | ICD-10-CM | POA: Diagnosis not present

## 2019-11-01 DIAGNOSIS — R4020 Unspecified coma: Secondary | ICD-10-CM | POA: Diagnosis not present

## 2019-11-01 DIAGNOSIS — M2550 Pain in unspecified joint: Secondary | ICD-10-CM | POA: Diagnosis not present

## 2019-11-01 LAB — URINALYSIS, ROUTINE W REFLEX MICROSCOPIC
Bilirubin Urine: NEGATIVE
Glucose, UA: NEGATIVE mg/dL
Hgb urine dipstick: NEGATIVE
Ketones, ur: NEGATIVE mg/dL
Leukocytes,Ua: NEGATIVE
Nitrite: NEGATIVE
Protein, ur: NEGATIVE mg/dL
Specific Gravity, Urine: 1.008 (ref 1.005–1.030)
pH: 7 (ref 5.0–8.0)

## 2019-11-01 LAB — CBC WITH DIFFERENTIAL/PLATELET
Abs Immature Granulocytes: 0.03 10*3/uL (ref 0.00–0.07)
Basophils Absolute: 0 10*3/uL (ref 0.0–0.1)
Basophils Relative: 1 %
Eosinophils Absolute: 0 10*3/uL (ref 0.0–0.5)
Eosinophils Relative: 1 %
HCT: 40.6 % (ref 36.0–46.0)
Hemoglobin: 12.9 g/dL (ref 12.0–15.0)
Immature Granulocytes: 1 %
Lymphocytes Relative: 17 %
Lymphs Abs: 1.1 10*3/uL (ref 0.7–4.0)
MCH: 28.5 pg (ref 26.0–34.0)
MCHC: 31.8 g/dL (ref 30.0–36.0)
MCV: 89.8 fL (ref 80.0–100.0)
Monocytes Absolute: 0.4 10*3/uL (ref 0.1–1.0)
Monocytes Relative: 6 %
Neutro Abs: 4.9 10*3/uL (ref 1.7–7.7)
Neutrophils Relative %: 74 %
Platelets: 246 10*3/uL (ref 150–400)
RBC: 4.52 MIL/uL (ref 3.87–5.11)
RDW: 14.6 % (ref 11.5–15.5)
WBC: 6.5 10*3/uL (ref 4.0–10.5)
nRBC: 0 % (ref 0.0–0.2)

## 2019-11-01 LAB — COMPREHENSIVE METABOLIC PANEL
ALT: 32 U/L (ref 0–44)
AST: 24 U/L (ref 15–41)
Albumin: 4 g/dL (ref 3.5–5.0)
Alkaline Phosphatase: 134 U/L — ABNORMAL HIGH (ref 38–126)
Anion gap: 10 (ref 5–15)
BUN: 20 mg/dL (ref 8–23)
CO2: 26 mmol/L (ref 22–32)
Calcium: 9 mg/dL (ref 8.9–10.3)
Chloride: 107 mmol/L (ref 98–111)
Creatinine, Ser: 1.06 mg/dL — ABNORMAL HIGH (ref 0.44–1.00)
GFR calc Af Amer: >60 mL/min (ref >60)
GFR calc non Af Amer: 52 mL/min — ABNORMAL LOW (ref >60)
Glucose, Bld: 93 mg/dL (ref 70–99)
Potassium: 4.1 mmol/L (ref 3.5–5.1)
Sodium: 143 mmol/L (ref 135–145)
Total Bilirubin: 0.5 mg/dL (ref 0.3–1.2)
Total Protein: 7 g/dL (ref 6.5–8.1)

## 2019-11-01 LAB — ETHANOL: Alcohol, Ethyl (B): <10 mg/dL (ref <10)

## 2019-11-01 LAB — CBG MONITORING, ED: Glucose-Capillary: 115 mg/dL — ABNORMAL HIGH (ref 70–99)

## 2019-11-01 LAB — TSH: TSH: 1.955 u[IU]/mL (ref 0.350–4.500)

## 2019-11-01 LAB — MAGNESIUM: Magnesium: 2.5 mg/dL — ABNORMAL HIGH (ref 1.7–2.4)

## 2019-11-01 MED ORDER — LORAZEPAM 2 MG/ML IJ SOLN
1.0000 mg | Freq: Once | INTRAMUSCULAR | Status: AC
  Administered 2019-11-01: 1 mg via INTRAVENOUS
  Filled 2019-11-01: qty 1

## 2019-11-01 MED ORDER — LEVETIRACETAM IN NACL 1000 MG/100ML IV SOLN
1000.0000 mg | Freq: Once | INTRAVENOUS | Status: AC
  Administered 2019-11-01: 1000 mg via INTRAVENOUS
  Filled 2019-11-01: qty 100

## 2019-11-01 MED ORDER — LEVETIRACETAM ER 500 MG PO TB24: ORAL_TABLET | ORAL | 4 refills | Status: DC

## 2019-11-01 NOTE — ED Notes (Signed)
Attempted to call report to [facility] place. No answer. [facility] called for transport.

## 2019-11-01 NOTE — ED Notes (Signed)
Seizure precautions initiated.

## 2019-11-01 NOTE — Discharge Instructions (Addendum)
Increase the keppra dose as prescribed.  Follow up with your neurologist to be rechecked

## 2019-11-01 NOTE — ED Provider Notes (Signed)
[place] DEPT Provider Note   CSN: [id number] Arrival date & time: 11/01/19  1021     History Chief Complaint  Patient presents with  . Seizures    Stacy Davis is a 74 y.o. female.  HPI   Patient presented to the ED for evaluation of a probable seizure.  The patient is a resident of a nursing facility.  According to the EMS report she had a witnessed tonic-clonic seizure lasting approximately 1 minute.  Patient does have a history of seizures and dementia.  She was seen in the ED about 1 week ago for the same.  Upon EMS arrival they noted that she was minimally responsive.  They did note some blood in her mouth.  In the ED the patient is awake but not answering questions or following commands.  Past Medical History:  Diagnosis Date  . Anxiety   . Arthritis    ?OA vs Rheum (established with Dr. Jefm Bryant pending blood work)  . Articular cartilage disorder of shoulder 04/2012   right  . Back pain    h/o DDD since 2010  . Basal cell carcinoma    right dorsum nose, right forhead  . Dementia ([place])   . Dementia ([place])   . Dental crowns present    also dental caps  . Diabetes mellitus    NIDDM  . Fecal incontinence   . GERD (gastroesophageal reflux disease)   . Headache(784.0)    occasional  . History of skin cancer   . Hyperlipidemia    no current med.  . Hypertension   . Hyperthyroidism   . Hypothyroid   . Impingement syndrome of right shoulder 04/2012  . Memory impairment    dementia- worsenign 12/2016 with medication non compliance and delusions.    . Orthostatic dizziness   . Right rotator cuff tear 05/15/2012  . Rotator cuff rupture 04/2012   right  . Seizures ([place])   . 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 Von Willebrand disease [facility])     Patient Active Problem List   Diagnosis Date Noted  . Orthostatic dizziness   . Dementia ([place])   . Seizures ([place]) 06/16/2018  . Subarachnoid hemorrhage  ([place])   . Syncope 04/11/2018  . Incontinence of feces 01/08/2018  . Depression 10/08/2017  . Memory impairment   . Advance care planning 10/06/2014  . Joint pain 04/15/2013  . Advance directive on file 08/12/2012  . Confusion 06/10/2012  . Right rotator cuff tear 05/15/2012  . Shoulder pain 05/03/2012  . Insomnia 08/13/2011  . 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 HTN (hypertension) 07/18/2011  . HLD (hyperlipidemia) 07/18/2011  . Memory loss 07/18/2011  . Hypothyroid 07/18/2011  . SVT (supraventricular tachycardia) ([place]) 07/18/2011  . Von Willebrand disease ([place]) 07/18/2011  . GERD (gastroesophageal reflux disease) 07/18/2011  . Back pain 07/18/2011  . Fatty liver 07/18/2011    Past Surgical History:  Procedure Laterality Date  . APPENDECTOMY    . BREAST SURGERY     hematoma evacuation due to trauma  . CARDIAC CATHETERIZATION  03/06/2001  . CESAREAN SECTION     x 2  . COLONOSCOPY  01/20/2006 per patient  . FRACTURE SURGERY    . KNEE ARTHROSCOPY Right 12/01/2014   Procedure: ARTHROSCOPY KNEE,partial medial menisectomy and plica excision;  Surgeon: Hessie Knows, MD;  Location: [facility] ORS;  Service: Orthopedics;  Laterality: Right;  . LUMBAR LAMINECTOMY/DECOMPRESSION MICRODISCECTOMY  03/01/2009  right L4-5; arthrodesis L4-5  . ORIF WRIST FRACTURE     left  . SHOULDER ARTHROSCOPY WITH ROTATOR CUFF REPAIR AND SUBACROMIAL DECOMPRESSION  05/15/2012   Procedure: SHOULDER ARTHROSCOPY WITH ROTATOR CUFF REPAIR AND SUBACROMIAL DECOMPRESSION;  Surgeon: Johnny Bridge, MD;  Location: [facility];  Service: Orthopedics;  Laterality: Right;  RIGHT ARTHROSCOPY SHOULDER DEBRIDEMENT LIMITED, ARTHROSCOPY SHOULDER DECOMPRESSION SUBACROMIAL PARTIAL ACROMIOPLASTY WITH CORACOACROMIAL RELEASE, ARTHROSCOPY SHOULDER WITH ROTATOR CUFF REPAIR  . TUBAL LIGATION       OB History   No obstetric history on file.     Family History  Problem  Relation Age of Onset  . Dementia Mother   . Arthritis Mother   . Cancer Father        unknown primary  . Heart disease Father   . Thyroid disease Sister   . Cancer Sister        brain tumor  . Dementia Sister   . COPD Brother   . Cancer Brother        bone cancer  . Colon cancer Paternal Grandfather   . Breast cancer Neg Hx     Social History   Tobacco Use  . Smoking status: Never Smoker  . Smokeless tobacco: Never Used  Substance Use Topics  . Alcohol use: No    Alcohol/week: 0.0 standard drinks  . Drug use: No    Home Medications Prior to Admission medications   Medication Sig Start Date End Date Taking? Authorizing Provider  levETIRAcetam (KEPPRA XR) 500 MG 24 hr tablet Take 2 tablets (1,000 mg total) by mouth at bedtime AND 1 tablet (500 mg total) in the morning. 11/01/19   Dorie Rank, MD  levothyroxine (SYNTHROID) 88 MCG tablet TAKE 1 TABLET BY MOUTH ONCE A DAY Patient taking differently: Take 88 mcg by mouth daily.  10/05/18   Susy Frizzle, MD  LORazepam (ATIVAN) 0.5 MG tablet Take 0.5 mg by mouth 2 (two) times daily as needed for anxiety.    [provider]  melatonin 3 MG TABS tablet Take 3 mg by mouth at bedtime.    [provider]  nystatin cream (MYCOSTATIN) APPLY 1 APPLICATION TOPICALLY DAILY AS NEEDED UNDER BELLY RASH Patient not taking: Reported on 10/22/2019 04/19/19   Susy Frizzle, MD  omeprazole (PRILOSEC) 40 MG capsule TAKE 1 CAPSULE BY MOUTH ONCE DAILY Patient taking differently: Take 40 mg by mouth daily.  04/01/19   Susy Frizzle, MD  potassium chloride SA (KLOR-CON) 20 MEQ tablet Take 1 tablet (20 mEq total) by mouth daily. 10/22/19   Harris, Abigail, PA-C  QUEtiapine (SEROQUEL) 25 MG tablet Take 1 tablet (25 mg total) by mouth at bedtime. 05/05/19   Marcial Pacas, MD  sertraline (ZOLOFT) 50 MG tablet Take 1 tablet (50 mg total) by mouth daily. Patient taking differently: Take 75 mg by mouth daily.  05/05/19   Marcial Pacas, MD     Allergies    Tramadol  Review of Systems   Review of Systems  Unable to perform ROS: Mental status change    Physical Exam Updated Vital Signs BP 106/70   Pulse (!) 56   Temp 97.9 F (36.6 C) (Oral)   Resp 10   SpO2 96%   Physical Exam Vitals and nursing note reviewed.  Constitutional:      Appearance: She is well-developed.     Comments: Patient appears to be mumbling and moaning  HENT:     Head: Normocephalic and atraumatic.  Right Ear: External ear normal.     Left Ear: External ear normal.  Eyes:     General: No scleral icterus.       Right eye: No discharge.        Left eye: No discharge.     Conjunctiva/sclera: Conjunctivae normal.  Neck:     Trachea: No tracheal deviation.  Cardiovascular:     Rate and Rhythm: Normal rate and regular rhythm.  Pulmonary:     Effort: Pulmonary effort is normal. No respiratory distress.     Breath sounds: Normal breath sounds. No stridor. No wheezing or rales.  Abdominal:     General: Bowel sounds are normal. There is no distension.     Palpations: Abdomen is soft.     Tenderness: There is no abdominal tenderness. There is no guarding or rebound.  Musculoskeletal:     Cervical back: Neck supple.     Comments: Old appearing scabs and superficial skin injury with bruising noted on the right small finger  Skin:    General: Skin is warm and dry.     Comments: Injury noted  Neurological:     Mental Status: She is alert.     GCS: GCS eye subscore is 4. GCS verbal subscore is 2. GCS motor subscore is 5.     Cranial Nerves: No cranial nerve deficit (no facial droop, mumbling).     Sensory: No sensory deficit.     Motor: Tremor present. No abnormal muscle tone or seizure activity.     Coordination: Coordination normal.     Comments: Moving extremities, upper greater than lower but bilateral,      ED Results / Procedures / Treatments   Labs (all labs ordered are listed, but only abnormal results are displayed) Labs  Reviewed  COMPREHENSIVE METABOLIC PANEL - Abnormal; Notable for the following components:      Result Value   Creatinine, Ser 1.06 (*)    Alkaline Phosphatase 134 (*)    GFR calc non Af Amer 52 (*)    All other components within normal limits  MAGNESIUM - Abnormal; Notable for the following components:   Magnesium 2.5 (*)    All other components within normal limits  CBG MONITORING, ED - Abnormal; Notable for the following components:   Glucose-Capillary 115 (*)    All other components within normal limits  ETHANOL  CBC WITH DIFFERENTIAL/PLATELET  TSH  LEVETIRACETAM LEVEL  URINALYSIS, ROUTINE W REFLEX MICROSCOPIC    EKG None  Radiology CT Head Wo Contrast  Result Date: 11/01/2019 CLINICAL DATA:  Seizure EXAM: CT HEAD WITHOUT CONTRAST TECHNIQUE: Contiguous axial images were obtained from the base of the skull through the vertex without intravenous contrast. COMPARISON:  04/26/2019 FINDINGS: Brain: No evidence of acute infarction, hemorrhage, hydrocephalus, extra-axial collection or mass lesion/mass effect. Mild periventricular white matter hypodensity. Vascular: No hyperdense vessel or unexpected calcification. Skull: Normal. Negative for fracture or focal lesion. Sinuses/Orbits: No acute finding. Other: None. IMPRESSION: No acute intracranial pathology.  Small-vessel white matter disease. Electronically Signed   By: Eddie Candle M.D.   On: 11/01/2019 11:37   DG Hand Complete Right  Result Date: 11/01/2019 CLINICAL DATA:  Fifth digit necrosis EXAM: RIGHT HAND - COMPLETE 3+ VIEW COMPARISON:  None. FINDINGS: No acute fracture or dislocation is noted. No bony erosive changes are seen to suggest osteomyelitis. Mild soft tissue swelling of the fifth digit is seen. Degenerative changes of the DIP joints are noted. IMPRESSION: Mild degenerative change without acute abnormality.  Electronically Signed   By: Inez Catalina M.D.   On: 11/01/2019 12:19    Procedures .1-3 Lead EKG  Interpretation Performed by: Dorie Rank, MD Authorized by: Dorie Rank, MD     Interpretation: normal     ECG rate:  71   ECG rate assessment: normal     Rhythm: sinus rhythm     Ectopy: none     Conduction: normal     (including critical care time)  Medications Ordered in ED Medications  levETIRAcetam (KEPPRA) IVPB 1000 mg/100 mL premix (has no administration in time range)  LORazepam (ATIVAN) injection 1 mg (1 mg Intravenous Given 11/01/19 1135)    ED Course  I have reviewed the triage vital signs and the nursing notes.  Pertinent labs & imaging results that were available during my care of the patient were reviewed by me and considered in my medical decision making (see chart for details).  Clinical Course as of Nov 01 1530  Mon Nov 01, 2019  1315 DG Hand Complete Right [JK]  1315 Hand x-ray without acute findings hand x-ray without acute finding   [JK]  1316 Head CT without acute changes   [JK]  1421 Patient was in the ED recently with seizure on May 28.  She had recurrent 1 today.  She has remained stable with no recurrent seizures in the ED.  Laboratory tests without acute abnormalities.  Will consult with neurology to discuss further evaluation, possible EEG   [JK]  1449 D/w Dr Leonel Ramsay.  Would add 1 gm IV here.  Add 500 mg xr.  OK to dc.  If recurrent seizure after that would consider admission at that time.   [JK]  1532 Attempted to call son.  Voicemail activated   [JK]    Clinical Course User Index [JK] Dorie Rank, MD   MDM Rules/Calculators/A&P                      Patient presented to the ED for a witnessed seizure.  Patient had a similar episode last week.  In the ED she has not had any recurrent seizures.  Patient's laboratory tests are otherwise reassuring.  CT scan without acute findings.  No status epilepticus discussed case with Dr. Leonel Ramsay.  He recommends giving her an additional dose of Keppra and adding 500 mg to her home regimen.  Patient  otherwise appears stable to return back to the nursing facility.  UA is pending.  We will check to see if she has an infection that could be triggering a seizure.  Final Clinical Impression(s) / ED Diagnoses Final diagnoses:  Seizure [facility])    Rx / DC Orders ED Discharge Orders         Ordered    levETIRAcetam (KEPPRA XR) 500 MG 24 hr tablet     11/01/19 1528           Dorie Rank, MD 11/01/19 1533

## 2019-11-01 NOTE — ED Triage Notes (Signed)
74 yo female from [facility] s/p witnessed seizured this morning. Staff stated it lasted approximately 1 min described as tonic/clonic. Hx of seizures and dementia. Was seen here last week for same. Pt is on Keppra. Upon EMS arrival pt was uresponsive with blood in mouth. Arousable now with some confusion. Unknown if mentation at baseline per EMS due to dementia.  #18 guage right hand established en route FSBS=111 Vitals: Sat 98%RA 124/78 HR 80's NSR 12 lead negative

## 2019-11-01 NOTE — ED Provider Notes (Signed)
Signout from Dr. Tomi Bamberger.  74 year old female interfacility with a history of seizure disorder here after a seizure.  Work-up is been fairly unremarkable so far.  Neurology made medication adjustments.  Plan is to follow-up on urinalysis and treat if positive.  Can return to facility. Physical Exam  BP 106/70   Pulse (!) 56   Temp 97.9 F (36.6 C) (Oral)   Resp 10   SpO2 96%   Physical Exam  ED Course/Procedures   Clinical Course as of Oct 31 1540  Mon Nov 01, 2019  1315 DG Hand Complete Right [JK]  1315 Hand x-ray without acute findings hand x-ray without acute finding   [JK]  1316 Head CT without acute changes   [JK]  1421 Patient was in the ED recently with seizure on May 28.  She had recurrent 1 today.  She has remained stable with no recurrent seizures in the ED.  Laboratory tests without acute abnormalities.  Will consult with neurology to discuss further evaluation, possible EEG   [JK]  1449 D/w Dr Leonel Ramsay.  Would add 1 gm IV here.  Add 500 mg xr.  OK to dc.  If recurrent seizure after that would consider admission at that time.   [JK]  1532 Attempted to call son.  Voicemail activated   [JK]    Clinical Course User Index [JK] Dorie Rank, MD    Procedures  MDM  Patient's urinalysis was unremarkable.  We will proceed with discharge per Dr. Johnsie Kindred instructions.      Hayden Rasmussen, MD 11/01/19 [phone]

## 2019-11-01 NOTE — ED Notes (Addendum)
Attempted to call report to [facility] place. No answer. Unable to reach Mosetta Anis at this time. Pt cont to await [facility] for transport.

## 2019-11-02 DIAGNOSIS — E119 Type 2 diabetes mellitus without complications: Secondary | ICD-10-CM | POA: Diagnosis not present

## 2019-11-02 DIAGNOSIS — Z9181 History of falling: Secondary | ICD-10-CM | POA: Diagnosis not present

## 2019-11-02 DIAGNOSIS — E039 Hypothyroidism, unspecified: Secondary | ICD-10-CM | POA: Diagnosis not present

## 2019-11-02 DIAGNOSIS — E7800 Pure hypercholesterolemia, unspecified: Secondary | ICD-10-CM | POA: Diagnosis not present

## 2019-11-02 DIAGNOSIS — F0280 Dementia in other diseases classified elsewhere without behavioral disturbance: Secondary | ICD-10-CM | POA: Diagnosis not present

## 2019-11-02 DIAGNOSIS — R569 Unspecified convulsions: Secondary | ICD-10-CM | POA: Diagnosis not present

## 2019-11-02 DIAGNOSIS — I10 Essential (primary) hypertension: Secondary | ICD-10-CM | POA: Diagnosis not present

## 2019-11-02 DIAGNOSIS — K219 Gastro-esophageal reflux disease without esophagitis: Secondary | ICD-10-CM | POA: Diagnosis not present

## 2019-11-02 DIAGNOSIS — G309 Alzheimer's disease, unspecified: Secondary | ICD-10-CM | POA: Diagnosis not present

## 2019-11-02 DIAGNOSIS — R32 Unspecified urinary incontinence: Secondary | ICD-10-CM | POA: Diagnosis not present

## 2019-11-03 ENCOUNTER — Ambulatory Visit: Payer: PPO | Admitting: Neurology

## 2019-11-03 DIAGNOSIS — L03011 Cellulitis of right finger: Secondary | ICD-10-CM | POA: Diagnosis not present

## 2019-11-03 DIAGNOSIS — M19041 Primary osteoarthritis, right hand: Secondary | ICD-10-CM | POA: Diagnosis not present

## 2019-11-03 DIAGNOSIS — E876 Hypokalemia: Secondary | ICD-10-CM | POA: Diagnosis not present

## 2019-11-03 DIAGNOSIS — W010XXA Fall on same level from slipping, tripping and stumbling without subsequent striking against object, initial encounter: Secondary | ICD-10-CM | POA: Diagnosis not present

## 2019-11-03 DIAGNOSIS — G40909 Epilepsy, unspecified, not intractable, without status epilepticus: Secondary | ICD-10-CM | POA: Diagnosis not present

## 2019-11-04 LAB — LEVETIRACETAM LEVEL: Levetiracetam Lvl: <1 ug/mL — ABNORMAL LOW (ref 10.0–40.0)

## 2019-11-08 DIAGNOSIS — F0280 Dementia in other diseases classified elsewhere without behavioral disturbance: Secondary | ICD-10-CM | POA: Diagnosis not present

## 2019-11-08 DIAGNOSIS — I10 Essential (primary) hypertension: Secondary | ICD-10-CM | POA: Diagnosis not present

## 2019-11-08 DIAGNOSIS — K219 Gastro-esophageal reflux disease without esophagitis: Secondary | ICD-10-CM | POA: Diagnosis not present

## 2019-11-08 DIAGNOSIS — E119 Type 2 diabetes mellitus without complications: Secondary | ICD-10-CM | POA: Diagnosis not present

## 2019-11-08 DIAGNOSIS — G309 Alzheimer's disease, unspecified: Secondary | ICD-10-CM | POA: Diagnosis not present

## 2019-11-08 DIAGNOSIS — M6281 Muscle weakness (generalized): Secondary | ICD-10-CM | POA: Diagnosis not present

## 2019-11-08 DIAGNOSIS — E7800 Pure hypercholesterolemia, unspecified: Secondary | ICD-10-CM | POA: Diagnosis not present

## 2019-11-08 DIAGNOSIS — E039 Hypothyroidism, unspecified: Secondary | ICD-10-CM | POA: Diagnosis not present

## 2019-11-08 DIAGNOSIS — R569 Unspecified convulsions: Secondary | ICD-10-CM | POA: Diagnosis not present

## 2019-11-08 DIAGNOSIS — R32 Unspecified urinary incontinence: Secondary | ICD-10-CM | POA: Diagnosis not present

## 2019-11-08 DIAGNOSIS — Z9181 History of falling: Secondary | ICD-10-CM | POA: Diagnosis not present

## 2019-11-10 DIAGNOSIS — R454 Irritability and anger: Secondary | ICD-10-CM | POA: Diagnosis not present

## 2019-11-10 DIAGNOSIS — F0391 Unspecified dementia with behavioral disturbance: Secondary | ICD-10-CM | POA: Diagnosis not present

## 2019-11-10 DIAGNOSIS — F419 Anxiety disorder, unspecified: Secondary | ICD-10-CM | POA: Diagnosis not present

## 2019-11-10 DIAGNOSIS — L03011 Cellulitis of right finger: Secondary | ICD-10-CM | POA: Diagnosis not present

## 2019-11-10 DIAGNOSIS — R451 Restlessness and agitation: Secondary | ICD-10-CM | POA: Diagnosis not present

## 2019-11-10 DIAGNOSIS — F5102 Adjustment insomnia: Secondary | ICD-10-CM | POA: Diagnosis not present

## 2019-11-10 DIAGNOSIS — S61206A Unspecified open wound of right little finger without damage to nail, initial encounter: Secondary | ICD-10-CM | POA: Diagnosis not present

## 2019-11-11 DIAGNOSIS — E7800 Pure hypercholesterolemia, unspecified: Secondary | ICD-10-CM | POA: Diagnosis not present

## 2019-11-11 DIAGNOSIS — Z9181 History of falling: Secondary | ICD-10-CM | POA: Diagnosis not present

## 2019-11-11 DIAGNOSIS — R32 Unspecified urinary incontinence: Secondary | ICD-10-CM | POA: Diagnosis not present

## 2019-11-11 DIAGNOSIS — I10 Essential (primary) hypertension: Secondary | ICD-10-CM | POA: Diagnosis not present

## 2019-11-11 DIAGNOSIS — E039 Hypothyroidism, unspecified: Secondary | ICD-10-CM | POA: Diagnosis not present

## 2019-11-11 DIAGNOSIS — F0280 Dementia in other diseases classified elsewhere without behavioral disturbance: Secondary | ICD-10-CM | POA: Diagnosis not present

## 2019-11-11 DIAGNOSIS — R569 Unspecified convulsions: Secondary | ICD-10-CM | POA: Diagnosis not present

## 2019-11-11 DIAGNOSIS — G309 Alzheimer's disease, unspecified: Secondary | ICD-10-CM | POA: Diagnosis not present

## 2019-11-11 DIAGNOSIS — E119 Type 2 diabetes mellitus without complications: Secondary | ICD-10-CM | POA: Diagnosis not present

## 2019-11-11 DIAGNOSIS — K219 Gastro-esophageal reflux disease without esophagitis: Secondary | ICD-10-CM | POA: Diagnosis not present

## 2019-11-13 DIAGNOSIS — Z9181 History of falling: Secondary | ICD-10-CM | POA: Diagnosis not present

## 2019-11-13 DIAGNOSIS — G309 Alzheimer's disease, unspecified: Secondary | ICD-10-CM | POA: Diagnosis not present

## 2019-11-13 DIAGNOSIS — E039 Hypothyroidism, unspecified: Secondary | ICD-10-CM | POA: Diagnosis not present

## 2019-11-13 DIAGNOSIS — E119 Type 2 diabetes mellitus without complications: Secondary | ICD-10-CM | POA: Diagnosis not present

## 2019-11-13 DIAGNOSIS — E7800 Pure hypercholesterolemia, unspecified: Secondary | ICD-10-CM | POA: Diagnosis not present

## 2019-11-13 DIAGNOSIS — I10 Essential (primary) hypertension: Secondary | ICD-10-CM | POA: Diagnosis not present

## 2019-11-13 DIAGNOSIS — F0280 Dementia in other diseases classified elsewhere without behavioral disturbance: Secondary | ICD-10-CM | POA: Diagnosis not present

## 2019-11-13 DIAGNOSIS — R32 Unspecified urinary incontinence: Secondary | ICD-10-CM | POA: Diagnosis not present

## 2019-11-13 DIAGNOSIS — R569 Unspecified convulsions: Secondary | ICD-10-CM | POA: Diagnosis not present

## 2019-11-13 DIAGNOSIS — K219 Gastro-esophageal reflux disease without esophagitis: Secondary | ICD-10-CM | POA: Diagnosis not present

## 2019-11-15 DIAGNOSIS — F0280 Dementia in other diseases classified elsewhere without behavioral disturbance: Secondary | ICD-10-CM | POA: Diagnosis not present

## 2019-11-15 DIAGNOSIS — E119 Type 2 diabetes mellitus without complications: Secondary | ICD-10-CM | POA: Diagnosis not present

## 2019-11-15 DIAGNOSIS — Z9181 History of falling: Secondary | ICD-10-CM | POA: Diagnosis not present

## 2019-11-15 DIAGNOSIS — G309 Alzheimer's disease, unspecified: Secondary | ICD-10-CM | POA: Diagnosis not present

## 2019-11-15 DIAGNOSIS — E039 Hypothyroidism, unspecified: Secondary | ICD-10-CM | POA: Diagnosis not present

## 2019-11-15 DIAGNOSIS — R569 Unspecified convulsions: Secondary | ICD-10-CM | POA: Diagnosis not present

## 2019-11-15 DIAGNOSIS — R32 Unspecified urinary incontinence: Secondary | ICD-10-CM | POA: Diagnosis not present

## 2019-11-15 DIAGNOSIS — E7800 Pure hypercholesterolemia, unspecified: Secondary | ICD-10-CM | POA: Diagnosis not present

## 2019-11-15 DIAGNOSIS — K219 Gastro-esophageal reflux disease without esophagitis: Secondary | ICD-10-CM | POA: Diagnosis not present

## 2019-11-15 DIAGNOSIS — I10 Essential (primary) hypertension: Secondary | ICD-10-CM | POA: Diagnosis not present

## 2019-11-17 DIAGNOSIS — R569 Unspecified convulsions: Secondary | ICD-10-CM | POA: Diagnosis not present

## 2019-11-17 DIAGNOSIS — E119 Type 2 diabetes mellitus without complications: Secondary | ICD-10-CM | POA: Diagnosis not present

## 2019-11-17 DIAGNOSIS — E7800 Pure hypercholesterolemia, unspecified: Secondary | ICD-10-CM | POA: Diagnosis not present

## 2019-11-17 DIAGNOSIS — R32 Unspecified urinary incontinence: Secondary | ICD-10-CM | POA: Diagnosis not present

## 2019-11-17 DIAGNOSIS — G309 Alzheimer's disease, unspecified: Secondary | ICD-10-CM | POA: Diagnosis not present

## 2019-11-17 DIAGNOSIS — K219 Gastro-esophageal reflux disease without esophagitis: Secondary | ICD-10-CM | POA: Diagnosis not present

## 2019-11-17 DIAGNOSIS — Z9181 History of falling: Secondary | ICD-10-CM | POA: Diagnosis not present

## 2019-11-17 DIAGNOSIS — I10 Essential (primary) hypertension: Secondary | ICD-10-CM | POA: Diagnosis not present

## 2019-11-17 DIAGNOSIS — E039 Hypothyroidism, unspecified: Secondary | ICD-10-CM | POA: Diagnosis not present

## 2019-11-17 DIAGNOSIS — F0280 Dementia in other diseases classified elsewhere without behavioral disturbance: Secondary | ICD-10-CM | POA: Diagnosis not present

## 2019-11-22 DIAGNOSIS — E7800 Pure hypercholesterolemia, unspecified: Secondary | ICD-10-CM | POA: Diagnosis not present

## 2019-11-22 DIAGNOSIS — F0280 Dementia in other diseases classified elsewhere without behavioral disturbance: Secondary | ICD-10-CM | POA: Diagnosis not present

## 2019-11-22 DIAGNOSIS — I10 Essential (primary) hypertension: Secondary | ICD-10-CM | POA: Diagnosis not present

## 2019-11-22 DIAGNOSIS — E119 Type 2 diabetes mellitus without complications: Secondary | ICD-10-CM | POA: Diagnosis not present

## 2019-11-22 DIAGNOSIS — K219 Gastro-esophageal reflux disease without esophagitis: Secondary | ICD-10-CM | POA: Diagnosis not present

## 2019-11-22 DIAGNOSIS — G309 Alzheimer's disease, unspecified: Secondary | ICD-10-CM | POA: Diagnosis not present

## 2019-11-22 DIAGNOSIS — R569 Unspecified convulsions: Secondary | ICD-10-CM | POA: Diagnosis not present

## 2019-11-22 DIAGNOSIS — Z9181 History of falling: Secondary | ICD-10-CM | POA: Diagnosis not present

## 2019-11-22 DIAGNOSIS — E039 Hypothyroidism, unspecified: Secondary | ICD-10-CM | POA: Diagnosis not present

## 2019-11-22 DIAGNOSIS — R32 Unspecified urinary incontinence: Secondary | ICD-10-CM | POA: Diagnosis not present

## 2019-12-01 DIAGNOSIS — I10 Essential (primary) hypertension: Secondary | ICD-10-CM | POA: Diagnosis not present

## 2019-12-01 DIAGNOSIS — F0280 Dementia in other diseases classified elsewhere without behavioral disturbance: Secondary | ICD-10-CM | POA: Diagnosis not present

## 2019-12-01 DIAGNOSIS — R569 Unspecified convulsions: Secondary | ICD-10-CM | POA: Diagnosis not present

## 2019-12-01 DIAGNOSIS — G309 Alzheimer's disease, unspecified: Secondary | ICD-10-CM | POA: Diagnosis not present

## 2019-12-01 DIAGNOSIS — R32 Unspecified urinary incontinence: Secondary | ICD-10-CM | POA: Diagnosis not present

## 2019-12-01 DIAGNOSIS — E119 Type 2 diabetes mellitus without complications: Secondary | ICD-10-CM | POA: Diagnosis not present

## 2019-12-01 DIAGNOSIS — K219 Gastro-esophageal reflux disease without esophagitis: Secondary | ICD-10-CM | POA: Diagnosis not present

## 2019-12-01 DIAGNOSIS — Z9181 History of falling: Secondary | ICD-10-CM | POA: Diagnosis not present

## 2019-12-01 DIAGNOSIS — E7800 Pure hypercholesterolemia, unspecified: Secondary | ICD-10-CM | POA: Diagnosis not present

## 2019-12-01 DIAGNOSIS — E039 Hypothyroidism, unspecified: Secondary | ICD-10-CM | POA: Diagnosis not present

## 2019-12-02 ENCOUNTER — Emergency Department (HOSPITAL_COMMUNITY)
Admission: EM | Admit: 2019-12-02 | Discharge: 2019-12-02 | Disposition: A | Discharge status: Skilled Nursing Facility | Payer: PPO | Attending: Emergency Medicine | Admitting: Emergency Medicine

## 2019-12-02 ENCOUNTER — Other Ambulatory Visit: Payer: Self-pay

## 2019-12-02 DIAGNOSIS — W1839XA Other fall on same level, initial encounter: Secondary | ICD-10-CM | POA: Insufficient documentation

## 2019-12-02 DIAGNOSIS — R404 Transient alteration of awareness: Secondary | ICD-10-CM | POA: Diagnosis not present

## 2019-12-02 DIAGNOSIS — E119 Type 2 diabetes mellitus without complications: Secondary | ICD-10-CM | POA: Diagnosis not present

## 2019-12-02 DIAGNOSIS — Y929 Unspecified place or not applicable: Secondary | ICD-10-CM | POA: Diagnosis not present

## 2019-12-02 DIAGNOSIS — I10 Essential (primary) hypertension: Secondary | ICD-10-CM | POA: Insufficient documentation

## 2019-12-02 DIAGNOSIS — E039 Hypothyroidism, unspecified: Secondary | ICD-10-CM | POA: Diagnosis not present

## 2019-12-02 DIAGNOSIS — Z79899 Other long term (current) drug therapy: Secondary | ICD-10-CM | POA: Diagnosis not present

## 2019-12-02 DIAGNOSIS — R410 Disorientation, unspecified: Secondary | ICD-10-CM | POA: Diagnosis not present

## 2019-12-02 DIAGNOSIS — R279 Unspecified lack of coordination: Secondary | ICD-10-CM | POA: Diagnosis not present

## 2019-12-02 DIAGNOSIS — S80211A Abrasion, right knee, initial encounter: Secondary | ICD-10-CM | POA: Insufficient documentation

## 2019-12-02 DIAGNOSIS — Y999 Unspecified external cause status: Secondary | ICD-10-CM | POA: Insufficient documentation

## 2019-12-02 DIAGNOSIS — Z743 Need for continuous supervision: Secondary | ICD-10-CM | POA: Diagnosis not present

## 2019-12-02 DIAGNOSIS — Y939 Activity, unspecified: Secondary | ICD-10-CM | POA: Diagnosis not present

## 2019-12-02 DIAGNOSIS — W19XXXA Unspecified fall, initial encounter: Secondary | ICD-10-CM | POA: Diagnosis not present

## 2019-12-02 DIAGNOSIS — R4182 Altered mental status, unspecified: Secondary | ICD-10-CM | POA: Diagnosis not present

## 2019-12-02 NOTE — ED Triage Notes (Signed)
[facility] transported pt from [facility] to [facility] ED and reports the following:  [employer] found pt sitting in another pt's closet and thought she had a fall. Pt is alert to self at baseline. She is at baseline and does not complain of injury or pain.

## 2019-12-02 NOTE — ED Provider Notes (Signed)
[place] DEPT Provider Note   CSN: [id number] Arrival date & time: 12/02/19  1631     History Chief Complaint  Patient presents with  . Fall    Stacy Davis is a 74 y.o. female.  74 year old female presents after possible unwitnessed fall at the nursing home.  Patient has history dementia and was found wandering in the patient's room.  Complains of abrasion to her right knee.  She denies any head or neck discomfort.  No other obvious sources of trauma.  Patient reportedly is at her baseline.  Presents via EMS        Past Medical History:  Diagnosis Date  . Anxiety   . Arthritis    ?OA vs Rheum (established with Dr. Jefm Bryant pending blood work)  . Articular cartilage disorder of shoulder 04/2012   right  . Back pain    h/o DDD since 2010  . Basal cell carcinoma    right dorsum nose, right forhead  . Dementia ([place])   . Dementia ([place])   . Dental crowns present    also dental caps  . Diabetes mellitus    NIDDM  . Fecal incontinence   . GERD (gastroesophageal reflux disease)   . Headache(784.0)    occasional  . History of skin cancer   . Hyperlipidemia    no current med.  . Hypertension   . Hyperthyroidism   . Hypothyroid   . Impingement syndrome of right shoulder 04/2012  . Memory impairment    dementia- worsenign 12/2016 with medication non compliance and delusions.    . Orthostatic dizziness   . Right rotator cuff tear 05/15/2012  . Rotator cuff rupture 04/2012   right  . Seizures ([place])   . 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 Von Willebrand disease [facility])     Patient Active Problem List   Diagnosis Date Noted  . Orthostatic dizziness   . Dementia ([place])   . Seizures ([place]) 06/16/2018  . Subarachnoid hemorrhage ([place])   . Syncope 04/11/2018  . Incontinence of feces 01/08/2018  . Depression 10/08/2017  . Memory impairment   . Advance care planning 10/06/2014  . Joint pain 04/15/2013  .  Advance directive on file 08/12/2012  . Confusion 06/10/2012  . Right rotator cuff tear 05/15/2012  . Shoulder pain 05/03/2012  . Insomnia 08/13/2011  . 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 HTN (hypertension) 07/18/2011  . HLD (hyperlipidemia) 07/18/2011  . Memory loss 07/18/2011  . Hypothyroid 07/18/2011  . SVT (supraventricular tachycardia) ([place]) 07/18/2011  . Von Willebrand disease ([place]) 07/18/2011  . GERD (gastroesophageal reflux disease) 07/18/2011  . Back pain 07/18/2011  . Fatty liver 07/18/2011    Past Surgical History:  Procedure Laterality Date  . APPENDECTOMY    . BREAST SURGERY     hematoma evacuation due to trauma  . CARDIAC CATHETERIZATION  03/06/2001  . CESAREAN SECTION     x 2  . COLONOSCOPY  01/20/2006 per patient  . FRACTURE SURGERY    . KNEE ARTHROSCOPY Right 12/01/2014   Procedure: ARTHROSCOPY KNEE,partial medial menisectomy and plica excision;  Surgeon: Hessie Knows, MD;  Location: [facility] ORS;  Service: Orthopedics;  Laterality: Right;  . LUMBAR LAMINECTOMY/DECOMPRESSION MICRODISCECTOMY  03/01/2009   right L4-5; arthrodesis L4-5  . ORIF WRIST FRACTURE     left  . SHOULDER ARTHROSCOPY WITH ROTATOR CUFF REPAIR AND SUBACROMIAL DECOMPRESSION  05/15/2012   Procedure: SHOULDER ARTHROSCOPY WITH  ROTATOR CUFF REPAIR AND SUBACROMIAL DECOMPRESSION;  Surgeon: Johnny Bridge, MD;  Location: [facility];  Service: Orthopedics;  Laterality: Right;  RIGHT ARTHROSCOPY SHOULDER DEBRIDEMENT LIMITED, ARTHROSCOPY SHOULDER DECOMPRESSION SUBACROMIAL PARTIAL ACROMIOPLASTY WITH CORACOACROMIAL RELEASE, ARTHROSCOPY SHOULDER WITH ROTATOR CUFF REPAIR  . TUBAL LIGATION       OB History   No obstetric history on file.     Family History  Problem Relation Age of Onset  . Dementia Mother   . Arthritis Mother   . Cancer Father        unknown primary  . Heart disease Father   . Thyroid disease Sister   . Cancer Sister         brain tumor  . Dementia Sister   . COPD Brother   . Cancer Brother        bone cancer  . Colon cancer Paternal Grandfather   . Breast cancer Neg Hx     Social History   Tobacco Use  . Smoking status: Never Smoker  . Smokeless tobacco: Never Used  Vaping Use  . Vaping Use: Never used  Substance Use Topics  . Alcohol use: No    Alcohol/week: 0.0 standard drinks  . Drug use: No    Home Medications Prior to Admission medications   Medication Sig Start Date End Date Taking? Authorizing Provider  levETIRAcetam (KEPPRA XR) 500 MG 24 hr tablet Take 2 tablets (1,000 mg total) by mouth at bedtime AND 1 tablet (500 mg total) in the morning. Patient not taking: Reported on 11/01/2019 11/01/19   Dorie Rank, MD  levETIRAcetam (KEPPRA) 500 MG tablet Take 1,000 mg by mouth at bedtime.    [provider]  levothyroxine (SYNTHROID) 88 MCG tablet TAKE 1 TABLET BY MOUTH ONCE A DAY Patient taking differently: Take 88 mcg by mouth daily.  10/05/18   Susy Frizzle, MD  LORazepam (ATIVAN) 0.5 MG tablet Take 0.5 mg by mouth 2 (two) times daily as needed for anxiety.    [provider]  melatonin 3 MG TABS tablet Take 3 mg by mouth at bedtime.    [provider]  nystatin cream (MYCOSTATIN) APPLY 1 APPLICATION TOPICALLY DAILY AS NEEDED UNDER BELLY RASH Patient not taking: Reported on 10/22/2019 04/19/19   Susy Frizzle, MD  omeprazole (PRILOSEC) 40 MG capsule TAKE 1 CAPSULE BY MOUTH ONCE DAILY Patient taking differently: Take 40 mg by mouth daily.  04/01/19   Susy Frizzle, MD  potassium chloride SA (KLOR-CON) 20 MEQ tablet Take 1 tablet (20 mEq total) by mouth daily. Patient not taking: Reported on 11/01/2019 10/22/19   Margarita Mail, PA-C  QUEtiapine (SEROQUEL) 25 MG tablet Take 1 tablet (25 mg total) by mouth at bedtime. 05/05/19   Marcial Pacas, MD  sertraline (ZOLOFT) 50 MG tablet Take 1 tablet (50 mg total) by mouth daily. 05/05/19   Marcial Pacas, MD    Allergies      Tramadol  Review of Systems   Review of Systems  All other systems reviewed and are negative.   Physical Exam Updated Vital Signs SpO2 98%   Physical Exam Vitals and nursing note reviewed.  Constitutional:      General: She is not in acute distress.    Appearance: Normal appearance. She is well-developed. She is not toxic-appearing.  HENT:     Head: Normocephalic and atraumatic.  Eyes:     General: Lids are normal.     Conjunctiva/sclera: Conjunctivae normal.     Pupils:  Pupils are equal, round, and reactive to light.  Neck:     Thyroid: No thyroid mass.     Trachea: No tracheal deviation.  Cardiovascular:     Rate and Rhythm: Normal rate and regular rhythm.     Heart sounds: Normal heart sounds. No murmur heard.  No gallop.   Pulmonary:     Effort: Pulmonary effort is normal. No respiratory distress.     Breath sounds: Normal breath sounds. No stridor. No decreased breath sounds, wheezing, rhonchi or rales.  Abdominal:     General: Bowel sounds are normal. There is no distension.     Palpations: Abdomen is soft.     Tenderness: There is no abdominal tenderness. There is no rebound.  Musculoskeletal:        General: No tenderness. Normal range of motion.     Cervical back: Normal range of motion and neck supple.       Legs:  Skin:    General: Skin is warm and dry.     Findings: No abrasion or rash.  Neurological:     General: No focal deficit present.     Mental Status: She is alert and oriented to person, place, and time.     GCS: GCS eye subscore is 4. GCS verbal subscore is 5. GCS motor subscore is 6.     Cranial Nerves: No cranial nerve deficit.     Sensory: No sensory deficit.     Motor: No weakness or tremor.     Comments: Patient moves all 4 extremities appropriately.  Psychiatric:        Mood and Affect: Affect is flat.        Speech: Speech normal.        Behavior: Behavior normal.     ED Results / Procedures / Treatments   Labs (all labs  ordered are listed, but only abnormal results are displayed) Labs Reviewed - No data to display  EKG None  Radiology No results found.  Procedures Procedures (including critical care time)  Medications Ordered in ED Medications - No data to display  ED Course  I have reviewed the triage vital signs and the nursing notes.  Pertinent labs & imaging results that were available during my care of the patient were reviewed by me and considered in my medical decision making (see chart for details).    MDM Rules/Calculators/A&P                          Patient's vital signs are stable here.  She has a mild abrasion to her right knee however she has full range of motion.  No ecchymosis.  She has no signs of bruising on her head.  Nontender along her spine.  No indication for imaging at this time.  She does not take blood thinners.  Will discharge home Final Clinical Impression(s) / ED Diagnoses Final diagnoses:  None    Rx / DC Orders ED Discharge Orders    None       Lacretia Leigh, MD 12/02/19 1722

## 2019-12-06 ENCOUNTER — Emergency Department (HOSPITAL_COMMUNITY)
Admission: EM | Admit: 2019-12-06 | Discharge: 2019-12-06 | Disposition: A | Discharge status: Skilled Nursing Facility | Payer: PPO | Attending: Emergency Medicine | Admitting: Emergency Medicine

## 2019-12-06 ENCOUNTER — Emergency Department (HOSPITAL_COMMUNITY): Payer: PPO

## 2019-12-06 DIAGNOSIS — T148XXA Other injury of unspecified body region, initial encounter: Secondary | ICD-10-CM | POA: Diagnosis not present

## 2019-12-06 DIAGNOSIS — E119 Type 2 diabetes mellitus without complications: Secondary | ICD-10-CM | POA: Diagnosis not present

## 2019-12-06 DIAGNOSIS — E039 Hypothyroidism, unspecified: Secondary | ICD-10-CM | POA: Insufficient documentation

## 2019-12-06 DIAGNOSIS — R279 Unspecified lack of coordination: Secondary | ICD-10-CM | POA: Diagnosis not present

## 2019-12-06 DIAGNOSIS — Y9389 Activity, other specified: Secondary | ICD-10-CM | POA: Insufficient documentation

## 2019-12-06 DIAGNOSIS — Z79899 Other long term (current) drug therapy: Secondary | ICD-10-CM | POA: Diagnosis not present

## 2019-12-06 DIAGNOSIS — R5381 Other malaise: Secondary | ICD-10-CM | POA: Diagnosis not present

## 2019-12-06 DIAGNOSIS — Y92129 Unspecified place in nursing home as the place of occurrence of the external cause: Secondary | ICD-10-CM | POA: Insufficient documentation

## 2019-12-06 DIAGNOSIS — Z7984 Long term (current) use of oral hypoglycemic drugs: Secondary | ICD-10-CM | POA: Diagnosis not present

## 2019-12-06 DIAGNOSIS — S199XXA Unspecified injury of neck, initial encounter: Secondary | ICD-10-CM | POA: Diagnosis not present

## 2019-12-06 DIAGNOSIS — S0990XA Unspecified injury of head, initial encounter: Secondary | ICD-10-CM | POA: Diagnosis not present

## 2019-12-06 DIAGNOSIS — Y999 Unspecified external cause status: Secondary | ICD-10-CM | POA: Insufficient documentation

## 2019-12-06 DIAGNOSIS — R9082 White matter disease, unspecified: Secondary | ICD-10-CM | POA: Diagnosis not present

## 2019-12-06 DIAGNOSIS — I10 Essential (primary) hypertension: Secondary | ICD-10-CM | POA: Diagnosis not present

## 2019-12-06 DIAGNOSIS — R569 Unspecified convulsions: Secondary | ICD-10-CM | POA: Insufficient documentation

## 2019-12-06 DIAGNOSIS — Z743 Need for continuous supervision: Secondary | ICD-10-CM | POA: Diagnosis not present

## 2019-12-06 DIAGNOSIS — W19XXXA Unspecified fall, initial encounter: Secondary | ICD-10-CM | POA: Diagnosis not present

## 2019-12-06 DIAGNOSIS — S0083XA Contusion of other part of head, initial encounter: Secondary | ICD-10-CM | POA: Diagnosis not present

## 2019-12-06 NOTE — ED Triage Notes (Signed)
Transported by [facility] from [facility]-- unwitnessed fall that occurred today; possible seizure? History of seizures and dementia. AAO x 1 which is baseline. VSS with EMS. CBG of 156 mg/dl. Bruising noted beneath right eye.

## 2019-12-06 NOTE — ED Provider Notes (Signed)
[facility] DEPT Provider Note   CSN: [id number] Arrival date & time: 12/06/19  1221     History Chief Complaint  Patient presents with  . Fall    Stacy Davis is a 74 y.o. female.  74 year old female with prior medical history as detailed below presents for evaluation following fall.  Patient reports a fall earlier today.  She reports striking her right cheek.  She denies headache or neck pain.  Cervical collar is in place.  She denies other injury to the arms or legs.    The history is provided by the patient.  Fall This is a new problem. The current episode started 3 to 5 hours ago. The problem occurs rarely. The problem has not changed since onset.Pertinent negatives include no chest pain and no abdominal pain. Nothing aggravates the symptoms. Nothing relieves the symptoms.       Past Medical History:  Diagnosis Date  . Anxiety   . Arthritis    ?OA vs Rheum (established with Dr. Jefm Bryant pending blood work)  . Articular cartilage disorder of shoulder 04/2012   right  . Back pain    h/o DDD since 2010  . Basal cell carcinoma    right dorsum nose, right forhead  . Dementia ([place])   . Dementia ([place])   . Dental crowns present    also dental caps  . Diabetes mellitus    NIDDM  . Fecal incontinence   . GERD (gastroesophageal reflux disease)   . Headache(784.0)    occasional  . History of skin cancer   . Hyperlipidemia    no current med.  . Hypertension   . Hyperthyroidism   . Hypothyroid   . Impingement syndrome of right shoulder 04/2012  . Memory impairment    dementia- worsenign 12/2016 with medication non compliance and delusions.    . Orthostatic dizziness   . Right rotator cuff tear 05/15/2012  . Rotator cuff rupture 04/2012   right  . Seizures ([place])   . 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 Von Willebrand disease [facility])     Patient Active Problem List   Diagnosis Date Noted  .  Orthostatic dizziness   . Dementia ([place])   . Seizures ([place]) 06/16/2018  . Subarachnoid hemorrhage ([place])   . Syncope 04/11/2018  . Incontinence of feces 01/08/2018  . Depression 10/08/2017  . Memory impairment   . Advance care planning 10/06/2014  . Joint pain 04/15/2013  . Advance directive on file 08/12/2012  . Confusion 06/10/2012  . Right rotator cuff tear 05/15/2012  . Shoulder pain 05/03/2012  . Insomnia 08/13/2011  . 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 HTN (hypertension) 07/18/2011  . HLD (hyperlipidemia) 07/18/2011  . Memory loss 07/18/2011  . Hypothyroid 07/18/2011  . SVT (supraventricular tachycardia) ([place]) 07/18/2011  . Von Willebrand disease ([place]) 07/18/2011  . GERD (gastroesophageal reflux disease) 07/18/2011  . Back pain 07/18/2011  . Fatty liver 07/18/2011    Past Surgical History:  Procedure Laterality Date  . APPENDECTOMY    . BREAST SURGERY     hematoma evacuation due to trauma  . CARDIAC CATHETERIZATION  03/06/2001  . CESAREAN SECTION     x 2  . COLONOSCOPY  01/20/2006 per patient  . FRACTURE SURGERY    . KNEE ARTHROSCOPY Right 12/01/2014   Procedure: ARTHROSCOPY KNEE,partial medial menisectomy and plica excision;  Surgeon: Hessie Knows, MD;  Location: [facility] ORS;  Service:  Orthopedics;  Laterality: Right;  . LUMBAR LAMINECTOMY/DECOMPRESSION MICRODISCECTOMY  03/01/2009   right L4-5; arthrodesis L4-5  . ORIF WRIST FRACTURE     left  . SHOULDER ARTHROSCOPY WITH ROTATOR CUFF REPAIR AND SUBACROMIAL DECOMPRESSION  05/15/2012   Procedure: SHOULDER ARTHROSCOPY WITH ROTATOR CUFF REPAIR AND SUBACROMIAL DECOMPRESSION;  Surgeon: Johnny Bridge, MD;  Location: [facility];  Service: Orthopedics;  Laterality: Right;  RIGHT ARTHROSCOPY SHOULDER DEBRIDEMENT LIMITED, ARTHROSCOPY SHOULDER DECOMPRESSION SUBACROMIAL PARTIAL ACROMIOPLASTY WITH CORACOACROMIAL RELEASE, ARTHROSCOPY SHOULDER WITH ROTATOR CUFF REPAIR  . TUBAL  LIGATION       OB History   No obstetric history on file.     Family History  Problem Relation Age of Onset  . Dementia Mother   . Arthritis Mother   . Cancer Father        unknown primary  . Heart disease Father   . Thyroid disease Sister   . Cancer Sister        brain tumor  . Dementia Sister   . COPD Brother   . Cancer Brother        bone cancer  . Colon cancer Paternal Grandfather   . Breast cancer Neg Hx     Social History   Tobacco Use  . Smoking status: Never Smoker  . Smokeless tobacco: Never Used  Vaping Use  . Vaping Use: Never used  Substance Use Topics  . Alcohol use: No    Alcohol/week: 0.0 standard drinks  . Drug use: No    Home Medications Prior to Admission medications   Medication Sig Start Date End Date Taking? Authorizing Provider  levETIRAcetam (KEPPRA XR) 500 MG 24 hr tablet Take 2 tablets (1,000 mg total) by mouth at bedtime AND 1 tablet (500 mg total) in the morning. Patient not taking: Reported on 11/01/2019 11/01/19   Dorie Rank, MD  levETIRAcetam (KEPPRA) 500 MG tablet Take 1,000 mg by mouth at bedtime.    [provider]  levothyroxine (SYNTHROID) 88 MCG tablet TAKE 1 TABLET BY MOUTH ONCE A DAY Patient taking differently: Take 88 mcg by mouth daily.  10/05/18   Susy Frizzle, MD  LORazepam (ATIVAN) 0.5 MG tablet Take 0.5 mg by mouth 2 (two) times daily as needed for anxiety.    [provider]  melatonin 3 MG TABS tablet Take 3 mg by mouth at bedtime.    [provider]  nystatin cream (MYCOSTATIN) APPLY 1 APPLICATION TOPICALLY DAILY AS NEEDED UNDER BELLY RASH Patient not taking: Reported on 10/22/2019 04/19/19   Susy Frizzle, MD  omeprazole (PRILOSEC) 40 MG capsule TAKE 1 CAPSULE BY MOUTH ONCE DAILY Patient taking differently: Take 40 mg by mouth daily.  04/01/19   Susy Frizzle, MD  potassium chloride SA (KLOR-CON) 20 MEQ tablet Take 1 tablet (20 mEq total) by mouth daily. Patient not taking: Reported  on 11/01/2019 10/22/19   Margarita Mail, PA-C  QUEtiapine (SEROQUEL) 25 MG tablet Take 1 tablet (25 mg total) by mouth at bedtime. 05/05/19   Marcial Pacas, MD  sertraline (ZOLOFT) 50 MG tablet Take 1 tablet (50 mg total) by mouth daily. 05/05/19   Marcial Pacas, MD    Allergies    Tramadol  Review of Systems   Review of Systems  Cardiovascular: Negative for chest pain.  Gastrointestinal: Negative for abdominal pain.  All other systems reviewed and are negative.   Physical Exam Updated Vital Signs BP 137/75   Pulse 87   Temp 97.6 F (36.4 C) (  Oral)   Resp 18   SpO2 100%   Physical Exam Vitals and nursing note reviewed.  Constitutional:      General: She is not in acute distress.    Appearance: Normal appearance. She is well-developed.  HENT:     Head: Normocephalic.     Comments: Small contusion to right cheek  Eyes:     Conjunctiva/sclera: Conjunctivae normal.     Pupils: Pupils are equal, round, and reactive to light.  Cardiovascular:     Rate and Rhythm: Normal rate and regular rhythm.     Heart sounds: Normal heart sounds.  Pulmonary:     Effort: Pulmonary effort is normal. No respiratory distress.     Breath sounds: Normal breath sounds.  Abdominal:     General: There is no distension.     Palpations: Abdomen is soft.     Tenderness: There is no abdominal tenderness.  Musculoskeletal:        General: No deformity. Normal range of motion.     Cervical back: Normal range of motion and neck supple.  Skin:    General: Skin is warm and dry.  Neurological:     Mental Status: She is alert and oriented to person, place, and time.     ED Results / Procedures / Treatments   Labs (all labs ordered are listed, but only abnormal results are displayed) Labs Reviewed - No data to display  EKG None  Radiology No results found.  Procedures Procedures (including critical care time)  Medications Ordered in ED Medications - No data to display  ED Course  I have  reviewed the triage vital signs and the nursing notes.  Pertinent labs & imaging results that were available during my care of the patient were reviewed by me and considered in my medical decision making (see chart for details).    MDM Rules/Calculators/A&P                          MDM  Screen complete  KHAMIYAH GREFE was evaluated in Emergency Department on 12/06/2019 for the symptoms described in the history of present illness. She was evaluated in the context of the global COVID-19 pandemic, which necessitated consideration that the patient might be at risk for infection with the SARS-CoV-2 virus that causes COVID-19. Institutional protocols and algorithms that pertain to the evaluation of patients at risk for COVID-19 are in a state of rapid change based on information released by regulatory bodies including the CDC and federal and state organizations. These policies and algorithms were followed during the patient's care in the ED.   Patient is presenting for evaluation following reported fall.  CT imaging did not reveal significant acute abnormality.  Patient appears to be at her mental status baseline.  Patient appears to be appropriate for discharge.   Final Clinical Impression(s) / ED Diagnoses Final diagnoses:  Fall, initial encounter    Rx / DC Orders ED Discharge Orders    None       Valarie Merino, MD 12/06/19 1426

## 2019-12-06 NOTE — ED Notes (Signed)
ED Provider at bedside. 

## 2019-12-06 NOTE — Discharge Instructions (Addendum)
Please return for any problem.  Follow-up with your regular care provider as instructed. °

## 2019-12-06 NOTE — ED Notes (Addendum)
[facility] has been contacted regarding transportation.

## 2019-12-08 DIAGNOSIS — S80211A Abrasion, right knee, initial encounter: Secondary | ICD-10-CM | POA: Diagnosis not present

## 2019-12-08 DIAGNOSIS — M6281 Muscle weakness (generalized): Secondary | ICD-10-CM | POA: Diagnosis not present

## 2019-12-08 DIAGNOSIS — S0081XA Abrasion of other part of head, initial encounter: Secondary | ICD-10-CM | POA: Diagnosis not present

## 2019-12-08 DIAGNOSIS — W19XXXA Unspecified fall, initial encounter: Secondary | ICD-10-CM | POA: Diagnosis not present

## 2019-12-10 ENCOUNTER — Emergency Department (HOSPITAL_COMMUNITY): Payer: PPO

## 2019-12-10 ENCOUNTER — Emergency Department (HOSPITAL_COMMUNITY)
Admission: EM | Admit: 2019-12-10 | Discharge: 2019-12-11 | Disposition: A | Discharge status: Home / Self Care | Payer: PPO | Attending: Emergency Medicine | Admitting: Emergency Medicine

## 2019-12-10 ENCOUNTER — Other Ambulatory Visit: Payer: Self-pay

## 2019-12-10 ENCOUNTER — Encounter (HOSPITAL_COMMUNITY): Payer: Self-pay | Admitting: Emergency Medicine

## 2019-12-10 DIAGNOSIS — Z20822 Contact with and (suspected) exposure to covid-19: Secondary | ICD-10-CM | POA: Insufficient documentation

## 2019-12-10 DIAGNOSIS — R404 Transient alteration of awareness: Secondary | ICD-10-CM | POA: Diagnosis not present

## 2019-12-10 DIAGNOSIS — R0902 Hypoxemia: Secondary | ICD-10-CM | POA: Diagnosis not present

## 2019-12-10 DIAGNOSIS — S199XXA Unspecified injury of neck, initial encounter: Secondary | ICD-10-CM | POA: Diagnosis not present

## 2019-12-10 DIAGNOSIS — R519 Headache, unspecified: Secondary | ICD-10-CM | POA: Insufficient documentation

## 2019-12-10 DIAGNOSIS — I10 Essential (primary) hypertension: Secondary | ICD-10-CM | POA: Insufficient documentation

## 2019-12-10 DIAGNOSIS — F0390 Unspecified dementia without behavioral disturbance: Secondary | ICD-10-CM | POA: Insufficient documentation

## 2019-12-10 DIAGNOSIS — R001 Bradycardia, unspecified: Secondary | ICD-10-CM | POA: Diagnosis not present

## 2019-12-10 DIAGNOSIS — Z98891 History of uterine scar from previous surgery: Secondary | ICD-10-CM | POA: Diagnosis not present

## 2019-12-10 DIAGNOSIS — M542 Cervicalgia: Secondary | ICD-10-CM | POA: Insufficient documentation

## 2019-12-10 DIAGNOSIS — E119 Type 2 diabetes mellitus without complications: Secondary | ICD-10-CM | POA: Diagnosis not present

## 2019-12-10 DIAGNOSIS — G44309 Post-traumatic headache, unspecified, not intractable: Secondary | ICD-10-CM | POA: Diagnosis not present

## 2019-12-10 DIAGNOSIS — E039 Hypothyroidism, unspecified: Secondary | ICD-10-CM | POA: Insufficient documentation

## 2019-12-10 DIAGNOSIS — W19XXXA Unspecified fall, initial encounter: Secondary | ICD-10-CM

## 2019-12-10 LAB — CBC WITH DIFFERENTIAL/PLATELET
Abs Immature Granulocytes: 0.01 10*3/uL (ref 0.00–0.07)
Basophils Absolute: 0 10*3/uL (ref 0.0–0.1)
Basophils Relative: 0 %
Eosinophils Absolute: 0.1 10*3/uL (ref 0.0–0.5)
Eosinophils Relative: 1 %
HCT: 34.2 % — ABNORMAL LOW (ref 36.0–46.0)
Hemoglobin: 11.3 g/dL — ABNORMAL LOW (ref 12.0–15.0)
Immature Granulocytes: 0 %
Lymphocytes Relative: 29 %
Lymphs Abs: 1.7 10*3/uL (ref 0.7–4.0)
MCH: 28.2 pg (ref 26.0–34.0)
MCHC: 33 g/dL (ref 30.0–36.0)
MCV: 85.3 fL (ref 80.0–100.0)
Monocytes Absolute: 0.5 10*3/uL (ref 0.1–1.0)
Monocytes Relative: 8 %
Neutro Abs: 3.7 10*3/uL (ref 1.7–7.7)
Neutrophils Relative %: 62 %
Platelets: 171 10*3/uL (ref 150–400)
RBC: 4.01 MIL/uL (ref 3.87–5.11)
RDW: 13.9 % (ref 11.5–15.5)
WBC: 6 10*3/uL (ref 4.0–10.5)
nRBC: 0 % (ref 0.0–0.2)

## 2019-12-10 LAB — PROTIME-INR
INR: 1 (ref 0.8–1.2)
Prothrombin Time: 12.6 seconds (ref 11.4–15.2)

## 2019-12-10 LAB — COMPREHENSIVE METABOLIC PANEL
ALT: 31 U/L (ref 0–44)
AST: 39 U/L (ref 15–41)
Albumin: 4.2 g/dL (ref 3.5–5.0)
Alkaline Phosphatase: 106 U/L (ref 38–126)
Anion gap: 9 (ref 5–15)
BUN: 27 mg/dL — ABNORMAL HIGH (ref 8–23)
CO2: 27 mmol/L (ref 22–32)
Calcium: 9.3 mg/dL (ref 8.9–10.3)
Chloride: 106 mmol/L (ref 98–111)
Creatinine, Ser: 0.71 mg/dL (ref 0.44–1.00)
GFR calc Af Amer: >60 mL/min (ref >60)
GFR calc non Af Amer: >60 mL/min (ref >60)
Glucose, Bld: 105 mg/dL — ABNORMAL HIGH (ref 70–99)
Potassium: 4.1 mmol/L (ref 3.5–5.1)
Sodium: 142 mmol/L (ref 135–145)
Total Bilirubin: 0.7 mg/dL (ref 0.3–1.2)
Total Protein: 7 g/dL (ref 6.5–8.1)

## 2019-12-10 LAB — SARS CORONAVIRUS 2 BY RT PCR (HOSPITAL ORDER, PERFORMED IN ~~LOC~~ HOSPITAL LAB): SARS Coronavirus 2: NEGATIVE

## 2019-12-10 NOTE — ED Notes (Signed)
Resent CBC. Called lab to confirm they got it.

## 2019-12-10 NOTE — Discharge Instructions (Addendum)
The imaging of the head and neck did not show any acute injuries and your lab work was otherwise reassuring.  You unable to pee for [place] however given your lack of any urinary symptoms, we have a low suspicion for UTI at this time.  We monitored you for several hours and you had no seizure-like activity, low suspicion for seizures at this time.  Please rest and stay hydrated and follow-up with your primary doctor as well as your neurologist.  If any symptoms change or worsen, return to the nearest emergency department.

## 2019-12-10 NOTE — ED Notes (Signed)
[facility] called for transport.  

## 2019-12-10 NOTE — ED Notes (Addendum)
Attempted to call report, no answer from facility. Per registration, they also attempted to call twice without answer.

## 2019-12-10 NOTE — ED Provider Notes (Addendum)
[facility] DEPT Provider Note   CSN: [id number] Arrival date & time: 12/10/19  1708     History Chief Complaint  Patient presents with  . Fall    Stacy Davis is a 74 y.o. female.  The history is provided by the patient and medical records. No language interpreter was used.  Fall This is a recurrent problem. The current episode started less than 1 hour ago. The problem occurs every several days. The problem has not changed since onset.Associated symptoms include headaches. Pertinent negatives include no chest pain, no abdominal pain and no shortness of breath. Nothing aggravates the symptoms. Nothing relieves the symptoms. She has tried nothing for the symptoms. The treatment provided no relief.       Past Medical History:  Diagnosis Date  . Anxiety   . Arthritis    ?OA vs Rheum (established with Dr. Jefm Bryant pending blood work)  . Articular cartilage disorder of shoulder 04/2012   right  . Back pain    h/o DDD since 2010  . Basal cell carcinoma    right dorsum nose, right forhead  . Dementia ([place])   . Dementia ([place])   . Dental crowns present    also dental caps  . Diabetes mellitus    NIDDM  . Fecal incontinence   . GERD (gastroesophageal reflux disease)   . Headache(784.0)    occasional  . History of skin cancer   . Hyperlipidemia    no current med.  . Hypertension   . Hyperthyroidism   . Hypothyroid   . Impingement syndrome of right shoulder 04/2012  . Memory impairment    dementia- worsenign 12/2016 with medication non compliance and delusions.    . Orthostatic dizziness   . Right rotator cuff tear 05/15/2012  . Rotator cuff rupture 04/2012   right  . Seizures ([place])   . 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 Von Willebrand disease [facility])     Patient Active Problem List   Diagnosis Date Noted  . Orthostatic dizziness   . Dementia ([place])   . Seizures ([place]) 06/16/2018  . Subarachnoid  hemorrhage ([place])   . Syncope 04/11/2018  . Incontinence of feces 01/08/2018  . Depression 10/08/2017  . Memory impairment   . Advance care planning 10/06/2014  . Joint pain 04/15/2013  . Advance directive on file 08/12/2012  . Confusion 06/10/2012  . Right rotator cuff tear 05/15/2012  . Shoulder pain 05/03/2012  . Insomnia 08/13/2011  . 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 HTN (hypertension) 07/18/2011  . HLD (hyperlipidemia) 07/18/2011  . Memory loss 07/18/2011  . Hypothyroid 07/18/2011  . SVT (supraventricular tachycardia) ([place]) 07/18/2011  . Von Willebrand disease ([place]) 07/18/2011  . GERD (gastroesophageal reflux disease) 07/18/2011  . Back pain 07/18/2011  . Fatty liver 07/18/2011    Past Surgical History:  Procedure Laterality Date  . APPENDECTOMY    . BREAST SURGERY     hematoma evacuation due to trauma  . CARDIAC CATHETERIZATION  03/06/2001  . CESAREAN SECTION     x 2  . COLONOSCOPY  01/20/2006 per patient  . FRACTURE SURGERY    . KNEE ARTHROSCOPY Right 12/01/2014   Procedure: ARTHROSCOPY KNEE,partial medial menisectomy and plica excision;  Surgeon: Hessie Knows, MD;  Location: [facility] ORS;  Service: Orthopedics;  Laterality: Right;  . LUMBAR LAMINECTOMY/DECOMPRESSION MICRODISCECTOMY  03/01/2009   right L4-5; arthrodesis L4-5  . ORIF WRIST FRACTURE  left  . SHOULDER ARTHROSCOPY WITH ROTATOR CUFF REPAIR AND SUBACROMIAL DECOMPRESSION  05/15/2012   Procedure: SHOULDER ARTHROSCOPY WITH ROTATOR CUFF REPAIR AND SUBACROMIAL DECOMPRESSION;  Surgeon: Johnny Bridge, MD;  Location: [facility];  Service: Orthopedics;  Laterality: Right;  RIGHT ARTHROSCOPY SHOULDER DEBRIDEMENT LIMITED, ARTHROSCOPY SHOULDER DECOMPRESSION SUBACROMIAL PARTIAL ACROMIOPLASTY WITH CORACOACROMIAL RELEASE, ARTHROSCOPY SHOULDER WITH ROTATOR CUFF REPAIR  . TUBAL LIGATION       OB History   No obstetric history on file.     Family History    Problem Relation Age of Onset  . Dementia Mother   . Arthritis Mother   . Cancer Father        unknown primary  . Heart disease Father   . Thyroid disease Sister   . Cancer Sister        brain tumor  . Dementia Sister   . COPD Brother   . Cancer Brother        bone cancer  . Colon cancer Paternal Grandfather   . Breast cancer Neg Hx     Social History   Tobacco Use  . Smoking status: Never Smoker  . Smokeless tobacco: Never Used  Vaping Use  . Vaping Use: Never used  Substance Use Topics  . Alcohol use: No    Alcohol/week: 0.0 standard drinks  . Drug use: No    Home Medications Prior to Admission medications   Medication Sig Start Date End Date Taking? Authorizing Provider  levETIRAcetam (KEPPRA XR) 500 MG 24 hr tablet Take 2 tablets (1,000 mg total) by mouth at bedtime AND 1 tablet (500 mg total) in the morning. Patient not taking: Reported on 11/01/2019 11/01/19   Dorie Rank, MD  levETIRAcetam (KEPPRA) 500 MG tablet Take 1,000 mg by mouth at bedtime.    [provider]  levothyroxine (SYNTHROID) 88 MCG tablet TAKE 1 TABLET BY MOUTH ONCE A DAY Patient taking differently: Take 88 mcg by mouth daily.  10/05/18   Susy Frizzle, MD  LORazepam (ATIVAN) 0.5 MG tablet Take 0.5 mg by mouth 2 (two) times daily as needed for anxiety.    [provider]  melatonin 3 MG TABS tablet Take 3 mg by mouth at bedtime.    [provider]  nystatin cream (MYCOSTATIN) APPLY 1 APPLICATION TOPICALLY DAILY AS NEEDED UNDER BELLY RASH Patient not taking: Reported on 10/22/2019 04/19/19   Susy Frizzle, MD  omeprazole (PRILOSEC) 40 MG capsule TAKE 1 CAPSULE BY MOUTH ONCE DAILY Patient taking differently: Take 40 mg by mouth daily.  04/01/19   Susy Frizzle, MD  potassium chloride SA (KLOR-CON) 20 MEQ tablet Take 1 tablet (20 mEq total) by mouth daily. Patient not taking: Reported on 11/01/2019 10/22/19   Margarita Mail, PA-C  QUEtiapine (SEROQUEL) 25 MG tablet Take  1 tablet (25 mg total) by mouth at bedtime. 05/05/19   Marcial Pacas, MD  sertraline (ZOLOFT) 50 MG tablet Take 1 tablet (50 mg total) by mouth daily. 05/05/19   Marcial Pacas, MD    Allergies    Tramadol  Review of Systems   Review of Systems  Unable to perform ROS: Dementia  Constitutional: Negative for chills and fever.  Respiratory: Negative for cough and shortness of breath.   Cardiovascular: Negative for chest pain.  Gastrointestinal: Negative for abdominal pain, constipation, diarrhea, nausea and vomiting.  Genitourinary: Negative for dysuria and frequency.  Musculoskeletal: Positive for neck pain. Negative for back pain and neck stiffness.  Neurological: Positive for headaches.  Psychiatric/Behavioral: Negative for agitation.    Physical Exam Updated Vital Signs BP 106/62   Pulse 72   Temp 98 F (36.7 C) (Oral)   Resp 20   SpO2 100%   Physical Exam Vitals and nursing note reviewed.  Constitutional:      General: She is not in acute distress.    Appearance: She is well-developed. She is not ill-appearing, toxic-appearing or diaphoretic.  HENT:     Head:   Eyes:     Extraocular Movements: Extraocular movements intact.     Right eye: Normal extraocular motion.     Left eye: Normal extraocular motion.     Conjunctiva/sclera: Conjunctivae normal.     Pupils: Pupils are unequal.   Cardiovascular:     Rate and Rhythm: Normal rate and regular rhythm.     Pulses: Normal pulses.     Heart sounds: No murmur heard.   Pulmonary:     Effort: Pulmonary effort is normal. No respiratory distress.     Breath sounds: Normal breath sounds. No wheezing, rhonchi or rales.  Chest:     Chest wall: No tenderness.  Abdominal:     General: Abdomen is flat.     Palpations: Abdomen is soft.     Tenderness: There is no abdominal tenderness. There is no right CVA tenderness, left CVA tenderness, guarding or rebound.  Musculoskeletal:        General: No tenderness.     Cervical back: Neck  supple.     Right lower leg: No edema.     Left lower leg: No edema.  Skin:    General: Skin is warm and dry.     Capillary Refill: Capillary refill takes less than 2 seconds.     Findings: No erythema.  Neurological:     Mental Status: She is alert. Mental status is at baseline.  Psychiatric:        Mood and Affect: Mood normal.     ED Results / Procedures / Treatments   Labs (all labs ordered are listed, but only abnormal results are displayed) Labs Reviewed  COMPREHENSIVE METABOLIC PANEL - Abnormal; Notable for the following components:      Result Value   Glucose, Bld 105 (*)    BUN 27 (*)    All other components within normal limits  CBC WITH DIFFERENTIAL/PLATELET - Abnormal; Notable for the following components:   Hemoglobin 11.3 (*)    HCT 34.2 (*)    All other components within normal limits  URINE CULTURE  SARS CORONAVIRUS 2 BY RT PCR (HOSPITAL ORDER, [facility])  PROTIME-INR  CBC WITH DIFFERENTIAL/PLATELET  URINALYSIS, ROUTINE W REFLEX MICROSCOPIC    EKG EKG Interpretation  Date/Time:  Friday December 10 2019 18:15:03 EDT Ventricular Rate:  59 PR Interval:    QRS Duration: 104 QT Interval:  430 QTC Calculation: 426 R Axis:   -7 Text Interpretation: Sinus rhythm Low voltage, precordial leads Consider anterior infarct When comapred to prior, similar apperance. No STEMI Confirmed by Antony Blackbird [phone]) on 12/10/2019 7:44:37 PM   Radiology CT Head Wo Contrast  Result Date: 12/10/2019 CLINICAL DATA:  Posttraumatic headache after witnessed fall. EXAM: CT HEAD WITHOUT CONTRAST CT CERVICAL SPINE WITHOUT CONTRAST TECHNIQUE: Multidetector CT imaging of the head and cervical spine was performed following the standard protocol without intravenous contrast. Multiplanar CT image reconstructions of the cervical spine were also generated. COMPARISON:  December 06, 2019. FINDINGS: CT HEAD FINDINGS Brain: Mild diffuse cortical atrophy is  noted. Mild  chronic ischemic white matter disease is noted. No mass effect or midline shift is noted. Ventricular size is within normal limits. There is no evidence of mass lesion, hemorrhage or acute infarction. Vascular: No hyperdense vessel or unexpected calcification. Skull: Normal. Negative for fracture or focal lesion. Sinuses/Orbits: No acute finding. Other: None. CT CERVICAL SPINE FINDINGS Alignment: Mild grade 1 anterolisthesis of C3-4 is noted secondary to posterior facet joint hypertrophy. Skull base and vertebrae: No acute fracture. No primary bone lesion or focal pathologic process. Soft tissues and spinal canal: No prevertebral fluid or swelling. No visible canal hematoma. Disc levels: Severe degenerative disc disease is noted at C4-5, C5-6, C6-7 and C7-T1. Upper chest: Negative. Other: Degenerative changes are seen involving posterior facet joints bilaterally. IMPRESSION: 1. Mild diffuse cortical atrophy. Mild chronic ischemic white matter disease. No acute intracranial abnormality seen. 2. Severe multilevel degenerative disc disease. No acute abnormality seen in the cervical spine. Electronically Signed   By: Marijo Conception M.D.   On: 12/10/2019 18:34   CT Cervical Spine Wo Contrast  Result Date: 12/10/2019 CLINICAL DATA:  Posttraumatic headache after witnessed fall. EXAM: CT HEAD WITHOUT CONTRAST CT CERVICAL SPINE WITHOUT CONTRAST TECHNIQUE: Multidetector CT imaging of the head and cervical spine was performed following the standard protocol without intravenous contrast. Multiplanar CT image reconstructions of the cervical spine were also generated. COMPARISON:  December 06, 2019. FINDINGS: CT HEAD FINDINGS Brain: Mild diffuse cortical atrophy is noted. Mild chronic ischemic white matter disease is noted. No mass effect or midline shift is noted. Ventricular size is within normal limits. There is no evidence of mass lesion, hemorrhage or acute infarction. Vascular: No hyperdense vessel or unexpected  calcification. Skull: Normal. Negative for fracture or focal lesion. Sinuses/Orbits: No acute finding. Other: None. CT CERVICAL SPINE FINDINGS Alignment: Mild grade 1 anterolisthesis of C3-4 is noted secondary to posterior facet joint hypertrophy. Skull base and vertebrae: No acute fracture. No primary bone lesion or focal pathologic process. Soft tissues and spinal canal: No prevertebral fluid or swelling. No visible canal hematoma. Disc levels: Severe degenerative disc disease is noted at C4-5, C5-6, C6-7 and C7-T1. Upper chest: Negative. Other: Degenerative changes are seen involving posterior facet joints bilaterally. IMPRESSION: 1. Mild diffuse cortical atrophy. Mild chronic ischemic white matter disease. No acute intracranial abnormality seen. 2. Severe multilevel degenerative disc disease. No acute abnormality seen in the cervical spine. Electronically Signed   By: Marijo Conception M.D.   On: 12/10/2019 18:34    Procedures Procedures (including critical care time)  Medications Ordered in ED Medications - No data to display  ED Course  I have reviewed the triage vital signs and the nursing notes.  Pertinent labs & imaging results that were available during my care of the patient were reviewed by me and considered in my medical decision making (see chart for details).    MDM Rules/Calculators/A&P                          MEDIA PIZZINI is a 74 y.o. female with a past medical history significant for dementia, diabetes, hypertension, hypothyroidism, hyperlipidemia, prior seizures, von Willebrand disease, prior subarachnoid hemorrhage, and GERD who presents with fall.  According to EMS report and nursing, patient had a witnessed fall today where she hit her head on a chair.  Patient was brought in for evaluation and EMS told nursing that they thought she had a brief focal seizure during transport  but I could not get more information about what they saw.  According to nursing who is seen her  several days ago, she is at her mental status baseline.  She is answering some questions appropriately but otherwise has not.  She is alert to her name and location.  She is denying any chest pain abdominal pain or back pain but does report some mild posterior headache and neck pain.  She denies any other complaints.  EMS did note the patient has had some difference in pupil size with left pupil being slightly larger than the right.  Unclear if this is new.  Chart review shows that she was seen both 2 days ago and 8 days ago for falls.  This is the third fall in the last week.  Patient denies any complaints at this time aside from the mild pain in her neck and back of the head.  She does not appear to be the best historian and is denying any preceding symptoms.  She does try to get out of bed.  On exam, patient does appear to have some tenderness on the left lateral neck and occiput.  No laceration or bleeding appreciated.  Lungs clear and chest nontender.  Abdomen nontender.  Patient moving all extremities and attempt to get out of the bed.  Pupils are slightly anisocoric with left pupil being slightly larger than the right.  Normal extraocular movements however.  Speech is clear when she is answering questions.  Mental status does appear to be at baseline according to nursing is take care of her twice in the last few days.  She does have some bruising on her right cheek from one of the recent falls.  Due to the possible seizure with EMS, fall, head injury, and the mild difference in pupil size, will get a stat head CT and C-spine CT.  We will get other screening labs to look for causes of these recurrent falls.  Anticipate reassessment after work-up.  8:34 PM Patient's work-up overall reassuring.  CT imaging of the head and neck did not show any acute fractures or bleeding.  CBC and CMP similar to prior.  Patient is denying any urinary symptoms and she has not been able to pee for [place].  Given her lack  of symptoms, low suspicion she has UTI at this time.  Given reassuring work-up thus far, we feel she is safe for discharge back to facility.  Patient discharged in good condition with reassuring work-up.   Final Clinical Impression(s) / ED Diagnoses Final diagnoses:  Fall, initial encounter    Rx / DC Orders ED Discharge Orders    None      Clinical Impression: 1. Fall, initial encounter     Disposition: Discharge  Condition: Good  I have discussed the results, Dx and Tx plan with the pt(& family if present). He/she/they expressed understanding and agree(s) with the plan. Discharge instructions discussed at great length. Strict return precautions discussed and pt &/or family have verbalized understanding of the instructions. No further questions at time of discharge.    New Prescriptions   No medications on file    Follow Up: Susy Frizzle, MD [address] [facility] [place] [address] [phone]     [facility]EM[facility]GENC[facility] DEPT [place] [id number] [place] [phone]       Eshal Propps, Gwenyth Allegra, MD 12/10/19 2036    Zayleigh Stroh, Gwenyth Allegra, MD 12/10/19 2036

## 2019-12-10 NOTE — ED Triage Notes (Signed)
Per EMS-states patient fell striking head on chair-witnessed by nursing home staff-states right pupil smaller than left which was observed during transport-history of focal seizure

## 2019-12-13 DIAGNOSIS — R451 Restlessness and agitation: Secondary | ICD-10-CM | POA: Diagnosis not present

## 2019-12-13 DIAGNOSIS — F5102 Adjustment insomnia: Secondary | ICD-10-CM | POA: Diagnosis not present

## 2019-12-13 DIAGNOSIS — F419 Anxiety disorder, unspecified: Secondary | ICD-10-CM | POA: Diagnosis not present

## 2019-12-13 DIAGNOSIS — R454 Irritability and anger: Secondary | ICD-10-CM | POA: Diagnosis not present

## 2019-12-13 DIAGNOSIS — F0391 Unspecified dementia with behavioral disturbance: Secondary | ICD-10-CM | POA: Diagnosis not present

## 2019-12-14 ENCOUNTER — Other Ambulatory Visit: Payer: Self-pay

## 2019-12-14 ENCOUNTER — Non-Acute Institutional Stay: Payer: PPO

## 2019-12-14 DIAGNOSIS — Z515 Encounter for palliative care: Secondary | ICD-10-CM

## 2019-12-15 DIAGNOSIS — F0390 Unspecified dementia without behavioral disturbance: Secondary | ICD-10-CM | POA: Diagnosis not present

## 2019-12-15 DIAGNOSIS — W19XXXA Unspecified fall, initial encounter: Secondary | ICD-10-CM | POA: Diagnosis not present

## 2019-12-16 NOTE — Progress Notes (Signed)
COMMUNITY PALLIATIVE CARE SW NOTE  PATIENT NAME: Stacy Davis DOB: 06/26/1945 MRN: [id number]  PRIMARY CARE PROVIDER: Susy Frizzle, MD  RESPONSIBLE PARTY:  Acct ID - Guarantor Home Phone Work Phone Relationship Acct Type  [id number] Stacy Davis, Stacy [phone]  Self P/F     [address] [place], [place] [address]     PLAN OF CARE and INTERVENTIONS:             1. GOALS OF CARE/ ADVANCE CARE PLANNING:  Patient is a FULL CODE. Goals will be assessed further wit family.  2. SOCIAL/EMOTIONAL/SPIRITUAL ASSESSMENT/ INTERVENTIONS:  SW completed a face-to-face visit with patient at the facility [facility]). Patient was present in the common area of the facility, sitting in a chair. She greeted SW warmly by smiling and responding to simple yes/no questions. She denied pain. She was alert and oriented to self, verbal, but scattered dialogue. She appears to be thin and frail in appearance. She has several bruising on her cheek, arms and legs. Patient has intermittent agitation and combativeness toward her peers and staff. SW consulted with the [facility] who reported that patient's appetite is poor as she is having increased agitation where she is not sitting long enough to eat. Patient is currently a FULL CODE.  3. PATIENT/CAREGIVER EDUCATION/ COPING:  Patient is alert and oriented to self only. Patient has cognitive deficits and is unable to provide any additional information for this initial visit. SW reinforced with [facility] palliative care role, visit frequency and a how to access palliative care support when needed.  4. PERSONAL EMERGENCY PLAN:  Per facility protocol.  5. COMMUNITY RESOURCES COORDINATION/ HEALTH CARE NAVIGATION:  Palliative care will follow patient at the facility and provide supportive visits to patient and family. 6. FINANCIAL/LEGAL CONCERNS/INTERVENTIONS:  No legal or financial issues presented.      SOCIAL HX:  Social History   Tobacco Use  .  Smoking status: Never Smoker  . Smokeless tobacco: Never Used  Substance Use Topics  . Alcohol use: No    Alcohol/week: 0.0 standard drinks    CODE STATUS: FULL CODE ADVANCED DIRECTIVES: No MOST FORM COMPLETE:  No HOSPICE EDUCATION PROVIDED: No  PPS: Patient is thin and frail in appearance. She ambulates independently, but gait is unsteady due to increased agitation and combativeness. Patient's appetite is poor.   Duration of visit and documentation: 60 minutes      Katheren Puller, LCSW

## 2019-12-24 ENCOUNTER — Encounter (HOSPITAL_COMMUNITY): Payer: Self-pay

## 2019-12-24 ENCOUNTER — Other Ambulatory Visit: Payer: Self-pay

## 2019-12-24 ENCOUNTER — Emergency Department (HOSPITAL_COMMUNITY): Payer: PPO

## 2019-12-24 ENCOUNTER — Emergency Department (HOSPITAL_COMMUNITY)
Admission: EM | Admit: 2019-12-24 | Discharge: 2019-12-24 | Disposition: A | Discharge status: Home / Self Care | Payer: PPO | Attending: Emergency Medicine | Admitting: Emergency Medicine

## 2019-12-24 DIAGNOSIS — F0390 Unspecified dementia without behavioral disturbance: Secondary | ICD-10-CM | POA: Insufficient documentation

## 2019-12-24 DIAGNOSIS — C4491 Basal cell carcinoma of skin, unspecified: Secondary | ICD-10-CM | POA: Diagnosis not present

## 2019-12-24 DIAGNOSIS — S6991XA Unspecified injury of right wrist, hand and finger(s), initial encounter: Secondary | ICD-10-CM | POA: Diagnosis present

## 2019-12-24 DIAGNOSIS — I10 Essential (primary) hypertension: Secondary | ICD-10-CM | POA: Insufficient documentation

## 2019-12-24 DIAGNOSIS — M25531 Pain in right wrist: Secondary | ICD-10-CM | POA: Diagnosis not present

## 2019-12-24 DIAGNOSIS — Y9389 Activity, other specified: Secondary | ICD-10-CM | POA: Diagnosis not present

## 2019-12-24 DIAGNOSIS — Y92129 Unspecified place in nursing home as the place of occurrence of the external cause: Secondary | ICD-10-CM | POA: Insufficient documentation

## 2019-12-24 DIAGNOSIS — R001 Bradycardia, unspecified: Secondary | ICD-10-CM | POA: Diagnosis not present

## 2019-12-24 DIAGNOSIS — Y999 Unspecified external cause status: Secondary | ICD-10-CM | POA: Insufficient documentation

## 2019-12-24 DIAGNOSIS — S0990XA Unspecified injury of head, initial encounter: Secondary | ICD-10-CM | POA: Diagnosis not present

## 2019-12-24 DIAGNOSIS — W010XXA Fall on same level from slipping, tripping and stumbling without subsequent striking against object, initial encounter: Secondary | ICD-10-CM | POA: Insufficient documentation

## 2019-12-24 DIAGNOSIS — S52591A Other fractures of lower end of right radius, initial encounter for closed fracture: Secondary | ICD-10-CM | POA: Diagnosis not present

## 2019-12-24 DIAGNOSIS — S52551A Other extraarticular fracture of lower end of right radius, initial encounter for closed fracture: Secondary | ICD-10-CM | POA: Diagnosis not present

## 2019-12-24 DIAGNOSIS — E039 Hypothyroidism, unspecified: Secondary | ICD-10-CM | POA: Insufficient documentation

## 2019-12-24 DIAGNOSIS — S199XXA Unspecified injury of neck, initial encounter: Secondary | ICD-10-CM | POA: Diagnosis not present

## 2019-12-24 DIAGNOSIS — E119 Type 2 diabetes mellitus without complications: Secondary | ICD-10-CM | POA: Insufficient documentation

## 2019-12-24 DIAGNOSIS — R52 Pain, unspecified: Secondary | ICD-10-CM | POA: Diagnosis not present

## 2019-12-24 DIAGNOSIS — M542 Cervicalgia: Secondary | ICD-10-CM | POA: Diagnosis not present

## 2019-12-24 DIAGNOSIS — W19XXXA Unspecified fall, initial encounter: Secondary | ICD-10-CM

## 2019-12-24 DIAGNOSIS — M25539 Pain in unspecified wrist: Secondary | ICD-10-CM | POA: Diagnosis not present

## 2019-12-24 DIAGNOSIS — R404 Transient alteration of awareness: Secondary | ICD-10-CM | POA: Diagnosis not present

## 2019-12-24 MED ORDER — LORAZEPAM 0.5 MG PO TABS
0.5000 mg | ORAL_TABLET | Freq: Once | ORAL | Status: AC
  Administered 2019-12-24: 0.5 mg via ORAL
  Filled 2019-12-24: qty 1

## 2019-12-24 MED ORDER — ACETAMINOPHEN 500 MG PO TABS
1000.0000 mg | ORAL_TABLET | Freq: Once | ORAL | Status: AC
  Administered 2019-12-24: 1000 mg via ORAL
  Filled 2019-12-24: qty 2

## 2019-12-24 MED ORDER — FENTANYL CITRATE (PF) 100 MCG/2ML IJ SOLN
50.0000 ug | Freq: Once | INTRAMUSCULAR | Status: AC
  Administered 2019-12-24: 50 ug via INTRAVENOUS
  Filled 2019-12-24: qty 2

## 2019-12-24 MED ORDER — FENTANYL CITRATE (PF) 100 MCG/2ML IJ SOLN
25.0000 ug | Freq: Once | INTRAMUSCULAR | Status: AC
  Administered 2019-12-24: 25 ug via INTRAVENOUS
  Filled 2019-12-24: qty 2

## 2019-12-24 NOTE — ED Notes (Signed)
Hand fed patient meal tray.

## 2019-12-24 NOTE — ED Notes (Signed)
Assumed care of patient at this time, nad noted, sr up x2, bed locked and low, call bell w/I reach.  Will continue to monitor. ° °

## 2019-12-24 NOTE — ED Notes (Signed)
Spoke to [facility] ETA 7pm.

## 2019-12-24 NOTE — Discharge Instructions (Addendum)
Stacy Davis has a fracture of her right wrist. Her head and neck CT are negative. Please keep her arm elevated as much as possible to help with swelling. It is important the splint stays in place, clean, and dry at all times. She can have tylenol every 6 hours for pain. Follow with the hand specialist, Dr. Amedeo Plenty, for recheck and further management to ensure proper healing.

## 2019-12-24 NOTE — Progress Notes (Signed)
Orthopedic Tech Progress Note Patient Details:  Stacy Davis 12/29/1945 [id number]  Ortho Devices Type of Ortho Device: Ace wrap, Arm sling, Sugartong splint Ortho Device/Splint Location: right Ortho Device/Splint Interventions: Application   Post Interventions Patient Tolerated: Well Instructions Provided: Care of device   Stacy Davis 12/24/2019, 3:23 PM

## 2019-12-24 NOTE — ED Provider Notes (Signed)
[facility] DEPT Provider Note   CSN: [id number] Arrival date & time: 12/24/19  1141     History Chief Complaint  Patient presents with  . Fall    Stacy Davis is a 74 y.o. female w PMHx dementia, HTN, von willebrand dz, seizure d/o, presenting the ED from memory care assisted living facility after unwitnessed fall into the bushes. It is suspected that she fell towards her right side catching herself in the bushes however she is guarding her right arm/wrist. Patient is not on anticoagulation and is at her mental baseline with dementia, oriented to person. Patient is unable to provide history of events leading up to the fall, however she does endorse right wrist/forearm pain. She denies abdominal pain, chest pain, headache. Denies lower extremity pain.  The history is provided by the EMS personnel and the patient. The history is limited by the condition of the patient.       Past Medical History:  Diagnosis Date  . Anxiety   . Arthritis    ?OA vs Rheum (established with Dr. Jefm Bryant pending blood work)  . Articular cartilage disorder of shoulder 04/2012   right  . Back pain    h/o DDD since 2010  . Basal cell carcinoma    right dorsum nose, right forhead  . Dementia ([place])   . Dementia ([place])   . Dental crowns present    also dental caps  . Diabetes mellitus    NIDDM  . Fecal incontinence   . GERD (gastroesophageal reflux disease)   . Headache(784.0)    occasional  . History of skin cancer   . Hyperlipidemia    no current med.  . Hypertension   . Hyperthyroidism   . Hypothyroid   . Impingement syndrome of right shoulder 04/2012  . Memory impairment    dementia- worsenign 12/2016 with medication non compliance and delusions.    . Orthostatic dizziness   . Right rotator cuff tear 05/15/2012  . Rotator cuff rupture 04/2012   right  . Seizures ([place])   . 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 Von Willebrand  disease [facility])     Patient Active Problem List   Diagnosis Date Noted  . Orthostatic dizziness   . Dementia ([place])   . Seizures ([place]) 06/16/2018  . Subarachnoid hemorrhage ([place])   . Syncope 04/11/2018  . Incontinence of feces 01/08/2018  . Depression 10/08/2017  . Memory impairment   . Advance care planning 10/06/2014  . Joint pain 04/15/2013  . Advance directive on file 08/12/2012  . Confusion 06/10/2012  . Right rotator cuff tear 05/15/2012  . Shoulder pain 05/03/2012  . Insomnia 08/13/2011  . 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 HTN (hypertension) 07/18/2011  . HLD (hyperlipidemia) 07/18/2011  . Memory loss 07/18/2011  . Hypothyroid 07/18/2011  . SVT (supraventricular tachycardia) ([place]) 07/18/2011  . Von Willebrand disease ([place]) 07/18/2011  . GERD (gastroesophageal reflux disease) 07/18/2011  . Back pain 07/18/2011  . Fatty liver 07/18/2011    Past Surgical History:  Procedure Laterality Date  . APPENDECTOMY    . BREAST SURGERY     hematoma evacuation due to trauma  . CARDIAC CATHETERIZATION  03/06/2001  . CESAREAN SECTION     x 2  . COLONOSCOPY  01/20/2006 per patient  . FRACTURE SURGERY    . KNEE ARTHROSCOPY Right 12/01/2014   Procedure: ARTHROSCOPY KNEE,partial medial menisectomy and plica excision;  Surgeon:  Hessie Knows, MD;  Location: [facility] ORS;  Service: Orthopedics;  Laterality: Right;  . LUMBAR LAMINECTOMY/DECOMPRESSION MICRODISCECTOMY  03/01/2009   right L4-5; arthrodesis L4-5  . ORIF WRIST FRACTURE     left  . SHOULDER ARTHROSCOPY WITH ROTATOR CUFF REPAIR AND SUBACROMIAL DECOMPRESSION  05/15/2012   Procedure: SHOULDER ARTHROSCOPY WITH ROTATOR CUFF REPAIR AND SUBACROMIAL DECOMPRESSION;  Surgeon: Johnny Bridge, MD;  Location: [facility];  Service: Orthopedics;  Laterality: Right;  RIGHT ARTHROSCOPY SHOULDER DEBRIDEMENT LIMITED, ARTHROSCOPY SHOULDER DECOMPRESSION SUBACROMIAL PARTIAL ACROMIOPLASTY WITH  CORACOACROMIAL RELEASE, ARTHROSCOPY SHOULDER WITH ROTATOR CUFF REPAIR  . TUBAL LIGATION       OB History   No obstetric history on file.     Family History  Problem Relation Age of Onset  . Dementia Mother   . Arthritis Mother   . Cancer Father        unknown primary  . Heart disease Father   . Thyroid disease Sister   . Cancer Sister        brain tumor  . Dementia Sister   . COPD Brother   . Cancer Brother        bone cancer  . Colon cancer Paternal Grandfather   . Breast cancer Neg Hx     Social History   Tobacco Use  . Smoking status: Never Smoker  . Smokeless tobacco: Never Used  Vaping Use  . Vaping Use: Never used  Substance Use Topics  . Alcohol use: No    Alcohol/week: 0.0 standard drinks  . Drug use: No    Home Medications Prior to Admission medications   Medication Sig Start Date End Date Taking? Authorizing Provider  levETIRAcetam (KEPPRA XR) 500 MG 24 hr tablet Take 2 tablets (1,000 mg total) by mouth at bedtime AND 1 tablet (500 mg total) in the morning. Patient not taking: Reported on 11/01/2019 11/01/19   Dorie Rank, MD  levETIRAcetam (KEPPRA) 500 MG tablet Take 1,000 mg by mouth at bedtime.    [provider]  levothyroxine (SYNTHROID) 88 MCG tablet TAKE 1 TABLET BY MOUTH ONCE A DAY Patient taking differently: Take 88 mcg by mouth daily.  10/05/18   Susy Frizzle, MD  LORazepam (ATIVAN) 0.5 MG tablet Take 0.5 mg by mouth 2 (two) times daily as needed for anxiety.    [provider]  melatonin 3 MG TABS tablet Take 3 mg by mouth at bedtime.    [provider]  nystatin cream (MYCOSTATIN) APPLY 1 APPLICATION TOPICALLY DAILY AS NEEDED UNDER BELLY RASH Patient not taking: Reported on 10/22/2019 04/19/19   Susy Frizzle, MD  omeprazole (PRILOSEC) 40 MG capsule TAKE 1 CAPSULE BY MOUTH ONCE DAILY Patient taking differently: Take 40 mg by mouth daily.  04/01/19   Susy Frizzle, MD  potassium chloride SA (KLOR-CON) 20 MEQ  tablet Take 1 tablet (20 mEq total) by mouth daily. Patient not taking: Reported on 11/01/2019 10/22/19   Margarita Mail, PA-C  QUEtiapine (SEROQUEL) 25 MG tablet Take 1 tablet (25 mg total) by mouth at bedtime. 05/05/19   Marcial Pacas, MD  sertraline (ZOLOFT) 50 MG tablet Take 1 tablet (50 mg total) by mouth daily. 05/05/19   Marcial Pacas, MD    Allergies    Tramadol  Review of Systems   Review of Systems  Unable to perform ROS: Dementia    Physical Exam Updated Vital Signs BP 97/82   Pulse 78   Temp 98.1 F (36.7 C) (Oral)   Resp  18   SpO2 100%   Physical Exam Vitals and nursing note reviewed.  Constitutional:      General: She is not in acute distress.    Appearance: She is well-developed.  HENT:     Head: Normocephalic and atraumatic.     Comments: No scalp hematoma or tenderness Eyes:     Conjunctiva/sclera: Conjunctivae normal.  Cardiovascular:     Rate and Rhythm: Normal rate and regular rhythm.  Pulmonary:     Effort: Pulmonary effort is normal. No respiratory distress.     Breath sounds: Normal breath sounds.  Chest:     Chest wall: No tenderness.  Abdominal:     General: Bowel sounds are normal.     Palpations: Abdomen is soft.     Tenderness: There is no abdominal tenderness.  Musculoskeletal:     Comments: Pelvis is stable. No pain with rotation of the hips, knees are nontender and not swollen with normal range of motion. Ankles are also benign. Patient guarding right wrist, splint in place per EMS. Swelling present at the wrist with bruising to the ulnar aspect. No wounds or deformity. Elbow and shoulder nontender with normal range of motion. C-collar in place per EMS, unclear if patient has tenderness with palpation of the C-spine or if patient is having pain from the right wrist.   Skin:    General: Skin is warm.  Neurological:     Mental Status: She is alert. Mental status is at baseline.     Comments: Spontaneously moving all extremities.  Psychiatric:         Cognition and Memory: Memory is impaired.     ED Results / Procedures / Treatments   Labs (all labs ordered are listed, but only abnormal results are displayed) Labs Reviewed - No data to display  EKG None  Radiology DG Wrist Complete Right  Result Date: 12/24/2019 CLINICAL DATA:  Fall, pain EXAM: RIGHT WRIST - COMPLETE 3+ VIEW COMPARISON:  November 01, 2019 FINDINGS: There is a transverse acute fracture of the distal right radius with dorsal angulation. No substantial displacement identified. No definite radiocarpal extension. IMPRESSION: Acute transverse fracture of the distal right radius with dorsal angulation. Electronically Signed   By: Macy Mis M.D.   On: 12/24/2019 13:23   CT Head Wo Contrast  Result Date: 12/24/2019 CLINICAL DATA:  Head trauma, minor. Unwitnessed fall, neck pain, dementia. EXAM: CT HEAD WITHOUT CONTRAST CT CERVICAL SPINE WITHOUT CONTRAST TECHNIQUE: Multidetector CT imaging of the head and cervical spine was performed following the standard protocol without intravenous contrast. Multiplanar CT image reconstructions of the cervical spine were also generated. COMPARISON:  Head CT 12/10/2019, CT cervical spine 12/10/2019. FINDINGS: CT HEAD FINDINGS Brain: Stable mild generalized parenchymal atrophy and chronic small vessel ischemic disease. There is no acute intracranial hemorrhage. No demarcated cortical infarct. No extra-axial fluid collection. No evidence of intracranial mass. No midline shift. Vascular: No hyperdense vessel. Skull: Normal. Negative for fracture or focal lesion. Sinuses/Orbits: Visualized orbits show no acute finding. Mild ethmoid sinus mucosal thickening. No significant mastoid effusion. CT CERVICAL SPINE FINDINGS Alignment: Straightening of the expected cervical lordosis. Unchanged 2 mm C3-C4 grade 1 anterolisthesis. Skull base and vertebrae: The basion-dental and atlanto-dental intervals are maintained.No evidence of acute fracture to the  cervical spine. Soft tissues and spinal canal: No prevertebral fluid or swelling. No visible canal hematoma. Disc levels: Cervical spondylosis with multilevel disc space narrowing, posterior disc osteophytes, uncovertebral and facet hypertrophy. Disc space narrowing is severe at  the C4-C5 through C7-T1 levels. Degenerative fusion across the C4-C5 and C5-C6 disc spaces. Additionally, there is degenerative fusion across the facet joints bilaterally at C4-C5. A posterior disc osteophyte complex contributes to mild/moderate bony spinal canal stenosis at C5-C6. Multilevel bony neural foraminal narrowing. Upper chest: No consolidation within the imaged lung apices. No visible pneumothorax. IMPRESSION: CT head: 1. No evidence of acute intracranial abnormality. 2. Stable mild generalized parenchymal atrophy and chronic small vessel ischemic disease. 3. Mild ethmoid sinus mucosal thickening. CT cervical spine: 1. No evidence of acute fracture to the cervical spine. 2. Unchanged 2 mm C3-C4 grade 1 anterolisthesis. 3. Advanced cervical spondylosis as outlined. Electronically Signed   By: Kellie Simmering DO   On: 12/24/2019 13:11   CT Cervical Spine Wo Contrast  Result Date: 12/24/2019 CLINICAL DATA:  Head trauma, minor. Unwitnessed fall, neck pain, dementia. EXAM: CT HEAD WITHOUT CONTRAST CT CERVICAL SPINE WITHOUT CONTRAST TECHNIQUE: Multidetector CT imaging of the head and cervical spine was performed following the standard protocol without intravenous contrast. Multiplanar CT image reconstructions of the cervical spine were also generated. COMPARISON:  Head CT 12/10/2019, CT cervical spine 12/10/2019. FINDINGS: CT HEAD FINDINGS Brain: Stable mild generalized parenchymal atrophy and chronic small vessel ischemic disease. There is no acute intracranial hemorrhage. No demarcated cortical infarct. No extra-axial fluid collection. No evidence of intracranial mass. No midline shift. Vascular: No hyperdense vessel. Skull: Normal.  Negative for fracture or focal lesion. Sinuses/Orbits: Visualized orbits show no acute finding. Mild ethmoid sinus mucosal thickening. No significant mastoid effusion. CT CERVICAL SPINE FINDINGS Alignment: Straightening of the expected cervical lordosis. Unchanged 2 mm C3-C4 grade 1 anterolisthesis. Skull base and vertebrae: The basion-dental and atlanto-dental intervals are maintained.No evidence of acute fracture to the cervical spine. Soft tissues and spinal canal: No prevertebral fluid or swelling. No visible canal hematoma. Disc levels: Cervical spondylosis with multilevel disc space narrowing, posterior disc osteophytes, uncovertebral and facet hypertrophy. Disc space narrowing is severe at the C4-C5 through C7-T1 levels. Degenerative fusion across the C4-C5 and C5-C6 disc spaces. Additionally, there is degenerative fusion across the facet joints bilaterally at C4-C5. A posterior disc osteophyte complex contributes to mild/moderate bony spinal canal stenosis at C5-C6. Multilevel bony neural foraminal narrowing. Upper chest: No consolidation within the imaged lung apices. No visible pneumothorax. IMPRESSION: CT head: 1. No evidence of acute intracranial abnormality. 2. Stable mild generalized parenchymal atrophy and chronic small vessel ischemic disease. 3. Mild ethmoid sinus mucosal thickening. CT cervical spine: 1. No evidence of acute fracture to the cervical spine. 2. Unchanged 2 mm C3-C4 grade 1 anterolisthesis. 3. Advanced cervical spondylosis as outlined. Electronically Signed   By: Kellie Simmering DO   On: 12/24/2019 13:11    Procedures Procedures (including critical care time)  Medications Ordered in ED Medications  fentaNYL (SUBLIMAZE) injection 25 mcg (25 mcg Intravenous Given 12/24/19 1207)  fentaNYL (SUBLIMAZE) injection 50 mcg (50 mcg Intravenous Given 12/24/19 1330)  acetaminophen (TYLENOL) tablet 1,000 mg (1,000 mg Oral Given 12/24/19 1546)    ED Course  I have reviewed the triage vital  signs and the nursing notes.  Pertinent labs & imaging results that were available during my care of the patient were reviewed by me and considered in my medical decision making (see chart for details).    MDM Rules/Calculators/A&P                          Patient presenting from memory care nursing home after  unwitnessed fall outside into the bushes.  This is patient's fourth ED visit this month after mechanical falls.  She is guarding right wrist on evaluation, splinted per EMS.  She does have c-collar in place.  No obvious head trauma.  She is at her mental baseline per EMS, oriented to self.  She does appear uncomfortable with the wrist.  There is swelling present and bruising, no deformity or wounds.  She is moving the digits spontaneously.  Remainder of extremities appear atraumatic spontaneous movement.  Chest and abdomen are nontender.  Plain film of the wrist obtained with distal radius fracture.  CT head and C-spine are negative.  Pain treated with fentanyl, which appears to have provided improvement in pain, as patient appears much more comfortable on reevaluation.  Wrist placed in sugar tong, sling.  Will recommend Tylenol for pain, as patient has been seen numerous times this month for fall, concern for opioid analgesics posing increased fall risk.  Hand referral provided for follow-up, instructed of symptomatic management and discharge paperwork for nursing home care.  Patient discussed with and evaluated by Dr. Maurie Boettcher, agrees with work-up and care plan at this time.  Patient discharged back to facility.  Final Clinical Impression(s) / ED Diagnoses Final diagnoses:  Other closed extra-articular fracture of distal end of right radius, initial encounter  Fall at nursing home, initial encounter    Rx / [place] Orders ED Discharge Orders    None       Chanler Schreiter, [place] N, PA-C 12/24/19 1614    Maudie Flakes, MD 12/25/19 [phone]

## 2019-12-24 NOTE — ED Notes (Signed)
Report called to NH. Transport called.

## 2019-12-24 NOTE — ED Notes (Signed)
This RN fed patient apple sauce. Meal tray ordered.

## 2019-12-24 NOTE — ED Notes (Signed)
Patient is resting comfortably.  Awaiting transport back to her facility.

## 2019-12-24 NOTE — ED Triage Notes (Signed)
Pt arrived via EMS from NH facility, memory care unit unwitnessed fall into bushes outsdie, right wrist guarding, no visible deformity, not on blood thinners. Hx of dementia, only oriented to person, baseline.   104/70 HR 60 97% RA 143 BS

## 2019-12-29 DIAGNOSIS — M6281 Muscle weakness (generalized): Secondary | ICD-10-CM | POA: Diagnosis not present

## 2019-12-29 DIAGNOSIS — G309 Alzheimer's disease, unspecified: Secondary | ICD-10-CM | POA: Diagnosis not present

## 2019-12-29 DIAGNOSIS — S52381A Bent bone of right radius, initial encounter for closed fracture: Secondary | ICD-10-CM | POA: Diagnosis not present

## 2019-12-29 DIAGNOSIS — W010XXA Fall on same level from slipping, tripping and stumbling without subsequent striking against object, initial encounter: Secondary | ICD-10-CM | POA: Diagnosis not present

## 2019-12-31 ENCOUNTER — Emergency Department (HOSPITAL_COMMUNITY): Payer: PPO

## 2019-12-31 ENCOUNTER — Other Ambulatory Visit (HOSPITAL_COMMUNITY): Payer: Self-pay

## 2019-12-31 ENCOUNTER — Other Ambulatory Visit: Payer: Self-pay

## 2019-12-31 ENCOUNTER — Other Ambulatory Visit (HOSPITAL_COMMUNITY): Payer: PPO

## 2019-12-31 ENCOUNTER — Inpatient Hospital Stay (HOSPITAL_COMMUNITY)
Admission: EM | Admit: 2019-12-31 | Discharge: 2020-01-05 | DRG: 536 | Disposition: A | Discharge status: Skilled Nursing Facility | Payer: PPO | Source: Skilled Nursing Facility | Attending: Internal Medicine | Admitting: Internal Medicine

## 2019-12-31 DIAGNOSIS — S72114D Nondisplaced fracture of greater trochanter of right femur, subsequent encounter for closed fracture with routine healing: Secondary | ICD-10-CM | POA: Diagnosis not present

## 2019-12-31 DIAGNOSIS — M25572 Pain in left ankle and joints of left foot: Secondary | ICD-10-CM | POA: Diagnosis not present

## 2019-12-31 DIAGNOSIS — Z8349 Family history of other endocrine, nutritional and metabolic diseases: Secondary | ICD-10-CM

## 2019-12-31 DIAGNOSIS — S72114A Nondisplaced fracture of greater trochanter of right femur, initial encounter for closed fracture: Secondary | ICD-10-CM | POA: Diagnosis not present

## 2019-12-31 DIAGNOSIS — S3991XA Unspecified injury of abdomen, initial encounter: Secondary | ICD-10-CM | POA: Diagnosis not present

## 2019-12-31 DIAGNOSIS — Z20822 Contact with and (suspected) exposure to covid-19: Secondary | ICD-10-CM | POA: Diagnosis present

## 2019-12-31 DIAGNOSIS — E039 Hypothyroidism, unspecified: Secondary | ICD-10-CM | POA: Diagnosis present

## 2019-12-31 DIAGNOSIS — S72001D Fracture of unspecified part of neck of right femur, subsequent encounter for closed fracture with routine healing: Secondary | ICD-10-CM | POA: Diagnosis not present

## 2019-12-31 DIAGNOSIS — D6800 Von Willebrand disease, unspecified: Secondary | ICD-10-CM | POA: Diagnosis present

## 2019-12-31 DIAGNOSIS — Z885 Allergy status to narcotic agent status: Secondary | ICD-10-CM | POA: Diagnosis not present

## 2019-12-31 DIAGNOSIS — M6281 Muscle weakness (generalized): Secondary | ICD-10-CM | POA: Diagnosis not present

## 2019-12-31 DIAGNOSIS — Z7189 Other specified counseling: Secondary | ICD-10-CM

## 2019-12-31 DIAGNOSIS — E119 Type 2 diabetes mellitus without complications: Secondary | ICD-10-CM | POA: Diagnosis not present

## 2019-12-31 DIAGNOSIS — W010XXA Fall on same level from slipping, tripping and stumbling without subsequent striking against object, initial encounter: Secondary | ICD-10-CM | POA: Diagnosis present

## 2019-12-31 DIAGNOSIS — S299XXA Unspecified injury of thorax, initial encounter: Secondary | ICD-10-CM | POA: Diagnosis not present

## 2019-12-31 DIAGNOSIS — Z8669 Personal history of other diseases of the nervous system and sense organs: Secondary | ICD-10-CM

## 2019-12-31 DIAGNOSIS — Z825 Family history of asthma and other chronic lower respiratory diseases: Secondary | ICD-10-CM

## 2019-12-31 DIAGNOSIS — K219 Gastro-esophageal reflux disease without esophagitis: Secondary | ICD-10-CM | POA: Diagnosis present

## 2019-12-31 DIAGNOSIS — Z800 Family history of malignant neoplasm of digestive organs: Secondary | ICD-10-CM

## 2019-12-31 DIAGNOSIS — M2550 Pain in unspecified joint: Secondary | ICD-10-CM | POA: Diagnosis not present

## 2019-12-31 DIAGNOSIS — E785 Hyperlipidemia, unspecified: Secondary | ICD-10-CM | POA: Diagnosis present

## 2019-12-31 DIAGNOSIS — F0390 Unspecified dementia without behavioral disturbance: Secondary | ICD-10-CM | POA: Diagnosis not present

## 2019-12-31 DIAGNOSIS — Z86008 Personal history of in-situ neoplasm of other site: Secondary | ICD-10-CM | POA: Diagnosis not present

## 2019-12-31 DIAGNOSIS — M4319 Spondylolisthesis, multiple sites in spine: Secondary | ICD-10-CM | POA: Diagnosis present

## 2019-12-31 DIAGNOSIS — E0590 Thyrotoxicosis, unspecified without thyrotoxic crisis or storm: Secondary | ICD-10-CM | POA: Diagnosis not present

## 2019-12-31 DIAGNOSIS — S72001A Fracture of unspecified part of neck of right femur, initial encounter for closed fracture: Secondary | ICD-10-CM | POA: Diagnosis present

## 2019-12-31 DIAGNOSIS — R413 Other amnesia: Secondary | ICD-10-CM | POA: Diagnosis not present

## 2019-12-31 DIAGNOSIS — M4856XA Collapsed vertebra, not elsewhere classified, lumbar region, initial encounter for fracture: Secondary | ICD-10-CM | POA: Diagnosis not present

## 2019-12-31 DIAGNOSIS — F0391 Unspecified dementia with behavioral disturbance: Secondary | ICD-10-CM | POA: Diagnosis not present

## 2019-12-31 DIAGNOSIS — Z8249 Family history of ischemic heart disease and other diseases of the circulatory system: Secondary | ICD-10-CM | POA: Diagnosis not present

## 2019-12-31 DIAGNOSIS — Z8261 Family history of arthritis: Secondary | ICD-10-CM

## 2019-12-31 DIAGNOSIS — F419 Anxiety disorder, unspecified: Secondary | ICD-10-CM | POA: Diagnosis not present

## 2019-12-31 DIAGNOSIS — S199XXA Unspecified injury of neck, initial encounter: Secondary | ICD-10-CM | POA: Diagnosis not present

## 2019-12-31 DIAGNOSIS — Y92129 Unspecified place in nursing home as the place of occurrence of the external cause: Secondary | ICD-10-CM

## 2019-12-31 DIAGNOSIS — Z515 Encounter for palliative care: Secondary | ICD-10-CM | POA: Diagnosis not present

## 2019-12-31 DIAGNOSIS — R52 Pain, unspecified: Secondary | ICD-10-CM | POA: Diagnosis not present

## 2019-12-31 DIAGNOSIS — W19XXXA Unspecified fall, initial encounter: Secondary | ICD-10-CM

## 2019-12-31 DIAGNOSIS — S72109A Unspecified trochanteric fracture of unspecified femur, initial encounter for closed fracture: Secondary | ICD-10-CM | POA: Diagnosis present

## 2019-12-31 DIAGNOSIS — R633 Feeding difficulties: Secondary | ICD-10-CM | POA: Diagnosis not present

## 2019-12-31 DIAGNOSIS — S72009A Fracture of unspecified part of neck of unspecified femur, initial encounter for closed fracture: Secondary | ICD-10-CM | POA: Diagnosis present

## 2019-12-31 DIAGNOSIS — M25551 Pain in right hip: Secondary | ICD-10-CM | POA: Diagnosis not present

## 2019-12-31 DIAGNOSIS — Z85828 Personal history of other malignant neoplasm of skin: Secondary | ICD-10-CM

## 2019-12-31 DIAGNOSIS — S2241XA Multiple fractures of ribs, right side, initial encounter for closed fracture: Secondary | ICD-10-CM | POA: Diagnosis not present

## 2019-12-31 DIAGNOSIS — Y9301 Activity, walking, marching and hiking: Secondary | ICD-10-CM | POA: Diagnosis present

## 2019-12-31 DIAGNOSIS — S3992XA Unspecified injury of lower back, initial encounter: Secondary | ICD-10-CM | POA: Diagnosis not present

## 2019-12-31 DIAGNOSIS — Z79899 Other long term (current) drug therapy: Secondary | ICD-10-CM

## 2019-12-31 DIAGNOSIS — M7541 Impingement syndrome of right shoulder: Secondary | ICD-10-CM | POA: Diagnosis present

## 2019-12-31 DIAGNOSIS — R2681 Unsteadiness on feet: Secondary | ICD-10-CM | POA: Diagnosis not present

## 2019-12-31 DIAGNOSIS — D509 Iron deficiency anemia, unspecified: Secondary | ICD-10-CM | POA: Diagnosis not present

## 2019-12-31 DIAGNOSIS — S2241XD Multiple fractures of ribs, right side, subsequent encounter for fracture with routine healing: Secondary | ICD-10-CM | POA: Diagnosis not present

## 2019-12-31 DIAGNOSIS — R531 Weakness: Secondary | ICD-10-CM | POA: Diagnosis not present

## 2019-12-31 DIAGNOSIS — I10 Essential (primary) hypertension: Secondary | ICD-10-CM | POA: Diagnosis present

## 2019-12-31 DIAGNOSIS — F329 Major depressive disorder, single episode, unspecified: Secondary | ICD-10-CM | POA: Diagnosis present

## 2019-12-31 DIAGNOSIS — Z79890 Hormone replacement therapy (postmenopausal): Secondary | ICD-10-CM

## 2019-12-31 DIAGNOSIS — S72101A Unspecified trochanteric fracture of right femur, initial encounter for closed fracture: Secondary | ICD-10-CM | POA: Diagnosis not present

## 2019-12-31 DIAGNOSIS — S79911A Unspecified injury of right hip, initial encounter: Secondary | ICD-10-CM | POA: Diagnosis not present

## 2019-12-31 DIAGNOSIS — S2239XA Fracture of one rib, unspecified side, initial encounter for closed fracture: Secondary | ICD-10-CM | POA: Diagnosis present

## 2019-12-31 DIAGNOSIS — R41841 Cognitive communication deficit: Secondary | ICD-10-CM | POA: Diagnosis not present

## 2019-12-31 DIAGNOSIS — R5381 Other malaise: Secondary | ICD-10-CM | POA: Diagnosis not present

## 2019-12-31 DIAGNOSIS — D649 Anemia, unspecified: Secondary | ICD-10-CM | POA: Diagnosis present

## 2019-12-31 DIAGNOSIS — R410 Disorientation, unspecified: Secondary | ICD-10-CM | POA: Diagnosis not present

## 2019-12-31 DIAGNOSIS — S7291XA Unspecified fracture of right femur, initial encounter for closed fracture: Secondary | ICD-10-CM | POA: Diagnosis not present

## 2019-12-31 DIAGNOSIS — R278 Other lack of coordination: Secondary | ICD-10-CM | POA: Diagnosis not present

## 2019-12-31 DIAGNOSIS — S0990XA Unspecified injury of head, initial encounter: Secondary | ICD-10-CM | POA: Diagnosis not present

## 2019-12-31 DIAGNOSIS — Z7401 Bed confinement status: Secondary | ICD-10-CM | POA: Diagnosis not present

## 2019-12-31 DIAGNOSIS — S2249XG Multiple fractures of ribs, unspecified side, subsequent encounter for fracture with delayed healing: Secondary | ICD-10-CM | POA: Diagnosis not present

## 2019-12-31 DIAGNOSIS — Z79891 Long term (current) use of opiate analgesic: Secondary | ICD-10-CM

## 2019-12-31 DIAGNOSIS — R400 Somnolence: Secondary | ICD-10-CM | POA: Diagnosis not present

## 2019-12-31 DIAGNOSIS — R2689 Other abnormalities of gait and mobility: Secondary | ICD-10-CM | POA: Diagnosis not present

## 2019-12-31 DIAGNOSIS — M545 Low back pain: Secondary | ICD-10-CM | POA: Diagnosis not present
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
## 2019-12-31 NOTE — ED Triage Notes (Signed)
Patient arrived by EMS from [facility]. Pt. Is A&O x1 to self. Confused is her baseline. She fell and tripped while walking. Now having right sided hip pain.  No LOC  Hx broken arm 1 week ago from previous fall Bp: 122/73- 64-16- 98% RA  CBG: 105

## 2020-01-01 ENCOUNTER — Encounter (HOSPITAL_COMMUNITY): Payer: Self-pay

## 2020-01-01 ENCOUNTER — Emergency Department (HOSPITAL_COMMUNITY): Payer: PPO

## 2020-01-01 DIAGNOSIS — E0590 Thyrotoxicosis, unspecified without thyrotoxic crisis or storm: Secondary | ICD-10-CM | POA: Diagnosis present

## 2020-01-01 DIAGNOSIS — D680 Von Willebrand's disease: Secondary | ICD-10-CM | POA: Diagnosis present

## 2020-01-01 DIAGNOSIS — Z20822 Contact with and (suspected) exposure to covid-19: Secondary | ICD-10-CM | POA: Diagnosis present

## 2020-01-01 DIAGNOSIS — W010XXA Fall on same level from slipping, tripping and stumbling without subsequent striking against object, initial encounter: Secondary | ICD-10-CM | POA: Diagnosis present

## 2020-01-01 DIAGNOSIS — E039 Hypothyroidism, unspecified: Secondary | ICD-10-CM | POA: Diagnosis present

## 2020-01-01 DIAGNOSIS — Z8261 Family history of arthritis: Secondary | ICD-10-CM | POA: Diagnosis not present

## 2020-01-01 DIAGNOSIS — Z7189 Other specified counseling: Secondary | ICD-10-CM | POA: Diagnosis not present

## 2020-01-01 DIAGNOSIS — S72114A Nondisplaced fracture of greater trochanter of right femur, initial encounter for closed fracture: Secondary | ICD-10-CM | POA: Diagnosis present

## 2020-01-01 DIAGNOSIS — S2239XA Fracture of one rib, unspecified side, initial encounter for closed fracture: Secondary | ICD-10-CM | POA: Diagnosis present

## 2020-01-01 DIAGNOSIS — D509 Iron deficiency anemia, unspecified: Secondary | ICD-10-CM

## 2020-01-01 DIAGNOSIS — S72009A Fracture of unspecified part of neck of unspecified femur, initial encounter for closed fracture: Secondary | ICD-10-CM | POA: Diagnosis present

## 2020-01-01 DIAGNOSIS — Z825 Family history of asthma and other chronic lower respiratory diseases: Secondary | ICD-10-CM | POA: Diagnosis not present

## 2020-01-01 DIAGNOSIS — F0390 Unspecified dementia without behavioral disturbance: Secondary | ICD-10-CM

## 2020-01-01 DIAGNOSIS — S2241XA Multiple fractures of ribs, right side, initial encounter for closed fracture: Secondary | ICD-10-CM

## 2020-01-01 DIAGNOSIS — S2249XG Multiple fractures of ribs, unspecified side, subsequent encounter for fracture with delayed healing: Secondary | ICD-10-CM

## 2020-01-01 DIAGNOSIS — Z885 Allergy status to narcotic agent status: Secondary | ICD-10-CM | POA: Diagnosis not present

## 2020-01-01 DIAGNOSIS — I10 Essential (primary) hypertension: Secondary | ICD-10-CM | POA: Diagnosis present

## 2020-01-01 DIAGNOSIS — Z800 Family history of malignant neoplasm of digestive organs: Secondary | ICD-10-CM | POA: Diagnosis not present

## 2020-01-01 DIAGNOSIS — Z86008 Personal history of in-situ neoplasm of other site: Secondary | ICD-10-CM | POA: Diagnosis not present

## 2020-01-01 DIAGNOSIS — K219 Gastro-esophageal reflux disease without esophagitis: Secondary | ICD-10-CM | POA: Diagnosis present

## 2020-01-01 DIAGNOSIS — Z85828 Personal history of other malignant neoplasm of skin: Secondary | ICD-10-CM | POA: Diagnosis not present

## 2020-01-01 DIAGNOSIS — Y92129 Unspecified place in nursing home as the place of occurrence of the external cause: Secondary | ICD-10-CM | POA: Diagnosis not present

## 2020-01-01 DIAGNOSIS — D649 Anemia, unspecified: Secondary | ICD-10-CM | POA: Diagnosis present

## 2020-01-01 DIAGNOSIS — M4856XA Collapsed vertebra, not elsewhere classified, lumbar region, initial encounter for fracture: Secondary | ICD-10-CM | POA: Diagnosis present

## 2020-01-01 DIAGNOSIS — S72101A Unspecified trochanteric fracture of right femur, initial encounter for closed fracture: Secondary | ICD-10-CM | POA: Diagnosis not present

## 2020-01-01 DIAGNOSIS — Y9301 Activity, walking, marching and hiking: Secondary | ICD-10-CM | POA: Diagnosis present

## 2020-01-01 DIAGNOSIS — S72109A Unspecified trochanteric fracture of unspecified femur, initial encounter for closed fracture: Secondary | ICD-10-CM | POA: Diagnosis present

## 2020-01-01 DIAGNOSIS — S72101G Unspecified trochanteric fracture of right femur, subsequent encounter for closed fracture with delayed healing: Secondary | ICD-10-CM

## 2020-01-01 DIAGNOSIS — Z8349 Family history of other endocrine, nutritional and metabolic diseases: Secondary | ICD-10-CM | POA: Diagnosis not present

## 2020-01-01 DIAGNOSIS — R531 Weakness: Secondary | ICD-10-CM | POA: Diagnosis present

## 2020-01-01 DIAGNOSIS — Z515 Encounter for palliative care: Secondary | ICD-10-CM | POA: Diagnosis not present

## 2020-01-01 DIAGNOSIS — Z8249 Family history of ischemic heart disease and other diseases of the circulatory system: Secondary | ICD-10-CM | POA: Diagnosis not present

## 2020-01-01 DIAGNOSIS — R413 Other amnesia: Secondary | ICD-10-CM | POA: Diagnosis not present

## 2020-01-01 DIAGNOSIS — M7541 Impingement syndrome of right shoulder: Secondary | ICD-10-CM | POA: Diagnosis present

## 2020-01-01 DIAGNOSIS — W19XXXA Unspecified fall, initial encounter: Secondary | ICD-10-CM | POA: Diagnosis not present

## 2020-01-01 DIAGNOSIS — E119 Type 2 diabetes mellitus without complications: Secondary | ICD-10-CM | POA: Diagnosis present

## 2020-01-01 DIAGNOSIS — E785 Hyperlipidemia, unspecified: Secondary | ICD-10-CM | POA: Diagnosis present

## 2020-01-01 DIAGNOSIS — F419 Anxiety disorder, unspecified: Secondary | ICD-10-CM | POA: Diagnosis present

## 2020-01-01 DIAGNOSIS — S72001A Fracture of unspecified part of neck of right femur, initial encounter for closed fracture: Secondary | ICD-10-CM | POA: Diagnosis present

## 2020-01-01 LAB — BASIC METABOLIC PANEL
Anion gap: 11 (ref 5–15)
Anion gap: 9 (ref 5–15)
BUN: 22 mg/dL (ref 8–23)
BUN: 28 mg/dL — ABNORMAL HIGH (ref 8–23)
CO2: 25 mmol/L (ref 22–32)
CO2: 26 mmol/L (ref 22–32)
Calcium: 8.9 mg/dL (ref 8.9–10.3)
Calcium: 9.2 mg/dL (ref 8.9–10.3)
Chloride: 103 mmol/L (ref 98–111)
Chloride: 105 mmol/L (ref 98–111)
Creatinine, Ser: 0.59 mg/dL (ref 0.44–1.00)
Creatinine, Ser: 0.67 mg/dL (ref 0.44–1.00)
GFR calc Af Amer: >60 mL/min (ref >60)
GFR calc Af Amer: >60 mL/min (ref >60)
GFR calc non Af Amer: >60 mL/min (ref >60)
GFR calc non Af Amer: >60 mL/min (ref >60)
Glucose, Bld: 86 mg/dL (ref 70–99)
Glucose, Bld: 88 mg/dL (ref 70–99)
Potassium: 3.5 mmol/L (ref 3.5–5.1)
Potassium: 3.8 mmol/L (ref 3.5–5.1)
Sodium: 137 mmol/L (ref 135–145)
Sodium: 142 mmol/L (ref 135–145)

## 2020-01-01 LAB — URINALYSIS, ROUTINE W REFLEX MICROSCOPIC
Bacteria, UA: NONE SEEN
Bilirubin Urine: NEGATIVE
Glucose, UA: NEGATIVE mg/dL
Ketones, ur: NEGATIVE mg/dL
Leukocytes,Ua: NEGATIVE
Nitrite: NEGATIVE
Protein, ur: NEGATIVE mg/dL
Specific Gravity, Urine: >1.046 — ABNORMAL HIGH (ref 1.005–1.030)
pH: 7 (ref 5.0–8.0)

## 2020-01-01 LAB — GLUCOSE, CAPILLARY
Glucose-Capillary: 85 mg/dL (ref 70–99)
Glucose-Capillary: 112 mg/dL — ABNORMAL HIGH (ref 70–99)

## 2020-01-01 LAB — CBC
HCT: 32.3 % — ABNORMAL LOW (ref 36.0–46.0)
HCT: 33.1 % — ABNORMAL LOW (ref 36.0–46.0)
Hemoglobin: 10.6 g/dL — ABNORMAL LOW (ref 12.0–15.0)
Hemoglobin: 11 g/dL — ABNORMAL LOW (ref 12.0–15.0)
MCH: 28 pg (ref 26.0–34.0)
MCH: 28.5 pg (ref 26.0–34.0)
MCHC: 32.8 g/dL (ref 30.0–36.0)
MCHC: 33.2 g/dL (ref 30.0–36.0)
MCV: 85.4 fL (ref 80.0–100.0)
MCV: 85.8 fL (ref 80.0–100.0)
Platelets: 185 10*3/uL (ref 150–400)
Platelets: 213 10*3/uL (ref 150–400)
RBC: 3.78 MIL/uL — ABNORMAL LOW (ref 3.87–5.11)
RBC: 3.86 MIL/uL — ABNORMAL LOW (ref 3.87–5.11)
RDW: 13.6 % (ref 11.5–15.5)
RDW: 13.8 % (ref 11.5–15.5)
WBC: 5.9 10*3/uL (ref 4.0–10.5)
WBC: 8.2 10*3/uL (ref 4.0–10.5)
nRBC: 0 % (ref 0.0–0.2)
nRBC: 0 % (ref 0.0–0.2)

## 2020-01-01 LAB — CBG MONITORING, ED
Glucose-Capillary: 84 mg/dL (ref 70–99)
Glucose-Capillary: 88 mg/dL (ref 70–99)

## 2020-01-01 LAB — SAMPLE TO BLOOD BANK

## 2020-01-01 LAB — SARS CORONAVIRUS 2 BY RT PCR (HOSPITAL ORDER, PERFORMED IN ~~LOC~~ HOSPITAL LAB): SARS Coronavirus 2: NEGATIVE

## 2020-01-01 LAB — PROTIME-INR
INR: 1.1 (ref 0.8–1.2)
Prothrombin Time: 13.8 seconds (ref 11.4–15.2)

## 2020-01-01 LAB — APTT: aPTT: 34 seconds (ref 24–36)

## 2020-01-01 MED ORDER — PANTOPRAZOLE SODIUM 40 MG PO TBEC
40.0000 mg | DELAYED_RELEASE_TABLET | Freq: Every day | ORAL | Status: DC
  Administered 2020-01-01 – 2020-01-05 (×5): 40 mg via ORAL
  Filled 2020-01-01 (×5): qty 1

## 2020-01-01 MED ORDER — LEVETIRACETAM IN NACL 1000 MG/100ML IV SOLN
1000.0000 mg | Freq: Every day | INTRAVENOUS | Status: DC
  Administered 2020-01-01: 1000 mg via INTRAVENOUS
  Filled 2020-01-01: qty 100

## 2020-01-01 MED ORDER — ONDANSETRON HCL 4 MG PO TABS: 4.0000 mg | ORAL_TABLET | Freq: Four times a day (QID) | ORAL | Status: DC | PRN

## 2020-01-01 MED ORDER — MELATONIN 3 MG PO TABS
3.0000 mg | ORAL_TABLET | Freq: Every day | ORAL | Status: DC
  Administered 2020-01-01 – 2020-01-05 (×5): 3 mg via ORAL
  Filled 2020-01-01 (×5): qty 1

## 2020-01-01 MED ORDER — IOHEXOL 300 MG/ML  SOLN
100.0000 mL | Freq: Once | INTRAMUSCULAR | Status: AC | PRN
  Administered 2020-01-01: 100 mL via INTRAVENOUS

## 2020-01-01 MED ORDER — LEVOTHYROXINE SODIUM 88 MCG PO TABS
88.0000 ug | ORAL_TABLET | Freq: Every day | ORAL | Status: DC
  Administered 2020-01-01 – 2020-01-05 (×5): 88 ug via ORAL
  Filled 2020-01-01 (×5): qty 1

## 2020-01-01 MED ORDER — SERTRALINE HCL 50 MG PO TABS
50.0000 mg | ORAL_TABLET | Freq: Every day | ORAL | Status: DC
  Administered 2020-01-01 – 2020-01-05 (×5): 50 mg via ORAL
  Filled 2020-01-01 (×5): qty 1

## 2020-01-01 MED ORDER — LORAZEPAM 0.5 MG PO TABS
0.5000 mg | ORAL_TABLET | Freq: Two times a day (BID) | ORAL | Status: DC | PRN
  Administered 2020-01-03 – 2020-01-05 (×3): 0.5 mg via ORAL
  Filled 2020-01-01 (×3): qty 1

## 2020-01-01 MED ORDER — QUETIAPINE FUMARATE 25 MG PO TABS
25.0000 mg | ORAL_TABLET | Freq: Every day | ORAL | Status: DC
  Administered 2020-01-01 – 2020-01-05 (×5): 25 mg via ORAL
  Filled 2020-01-01 (×5): qty 1

## 2020-01-01 MED ORDER — ONDANSETRON HCL 4 MG/2ML IJ SOLN: 4.0000 mg | Freq: Four times a day (QID) | INTRAMUSCULAR | Status: DC | PRN

## 2020-01-01 MED ORDER — LEVETIRACETAM IN NACL 500 MG/100ML IV SOLN
500.0000 mg | Freq: Every day | INTRAVENOUS | Status: DC
  Administered 2020-01-01: 500 mg via INTRAVENOUS
  Filled 2020-01-01 (×2): qty 100

## 2020-01-01 MED ORDER — MORPHINE SULFATE (PF) 2 MG/ML IV SOLN
0.5000 mg | INTRAVENOUS | Status: DC | PRN
  Administered 2020-01-01 (×3): 0.5 mg via INTRAVENOUS
  Filled 2020-01-01 (×3): qty 1

## 2020-01-01 MED ORDER — SODIUM CHLORIDE (PF) 0.9 % IJ SOLN
INTRAMUSCULAR | Status: AC
  Filled 2020-01-01: qty 50

## 2020-01-01 NOTE — ED Provider Notes (Addendum)
Patient left at change of shift to get results of her CT scan to further investigate her complaint of pain on the right side.  Patient CT does show a intertrochanteric fracture on the right.  2:00 AM I left a message for Hayes Ludwig, son at [phone] to call me back.  When I talked to patient she is unable to tell me if she has an orthopedist.  When I look through her prior imaging there is not an obvious orthopedist name seen.  Additional blood work and EKG added to facilitate her having her surgery if needed.  2:33 AM Dr. Veverly Fells, orthopedic surgeon will see in the morning.  He has asked the hospitalist to admit.  He was added to the treatment team per his request.    Dr Hal Hope, hospitalist, will admit.   CT ABDOMEN PELVIS W CONTRAST  Result Date: 01/01/2020 CLINICAL DATA:  Fall and tripped, right hip pain EXAM: CT ABDOMEN AND PELVIS WITH CONTRAST TECHNIQUE: Multidetector CT imaging of the abdomen and pelvis was performed using the standard protocol following bolus administration of intravenous contrast. CONTRAST:  119mL OMNIPAQUE IOHEXOL 300 MG/ML  SOLN COMPARISON:  Radiograph same day FINDINGS: Hepatobiliary: Homogeneous hepatic attenuation without traumatic injury. No focal lesion. Gallbladder physiologically distended, no calcified stone. No biliary dilatation. Pancreas: No evidence for traumatic injury. Fatty atrophy of the pancreas is noted. No ductal dilatation or inflammation. Spleen: Homogeneous attenuation without traumatic injury. Normal in size. Adrenals/Urinary Tract: No adrenal hemorrhage. Kidneys demonstrate symmetric enhancement and excretion on delayed phase imaging. No evidence or renal injury. Ureters are well opacified proximal through mid portion. Bladder is physiologically distended without wall thickening. Stomach/Bowel: Suboptimally assessed without enteric contrast, allowing for this, no evidence of bowel injury. Stomach physiologically distended. There are no dilated  or thickened small or large bowel loops. Moderate stool burden. No evidence of mesenteric hematoma. No free air free fluid. Vascular/Lymphatic: No acute vascular injury. The abdominal aorta and IVC are intact. Scattered aortic atherosclerotic calcifications are seen without aneurysmal dilatation. No evidence of retroperitoneal, abdominal, or pelvic adenopathy. Reproductive: No acute abnormality. Other: No focal contusion or abnormality of the abdominal wall. Musculoskeletal: There is a comminuted slightly impacted fracture involving the right femoral greater tuberosity. Overlying soft tissue swelling seen along the right lateral hip. There is also nondisplaced fractures of the posterior right tenth and eleventh ribs. There appears to be age indeterminate, slight compression deformities of the L3 and L4 vertebral bodies with less than 25% loss in height. A x stop fixation is noted at L4-L5. IMPRESSION: Comminuted impacted nondisplaced fracture of the right greater trochanter. Nondisplaced posterior right tenth and eleventh rib fractures. Age indeterminate slight superior compression deformities of the L3 and L4 vertebral bodies with less than 25% loss height. No acute intra-abdominal or pelvic injury. Aortic Atherosclerosis (ICD10-I70.0). Electronically Signed   By: Prudencio Pair M.D.   On: 01/01/2020 01:29   CT L-SPINE NO CHARGE  Result Date: 01/01/2020 CLINICAL DATA:  Fall and pain EXAM: CT LUMBAR SPINE WITHOUT CONTRAST TECHNIQUE: Multidetector CT imaging of the lumbar spine was performed without intravenous contrast administration. Multiplanar CT image reconstructions were also generated. COMPARISON:  None. FINDINGS: Segmentation: There are 5 non-rib bearing lumbar type vertebral bodies with the last intervertebral disc space labeled as L5-S1. 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 A grade 1 anterolisthesis of L4 on L5 is seen measuring 4 mm. Vertebrae: There is age  indeterminate slight superior compression deformity of the L3 and  L4 vertebral bodies with less than 25% loss in height. There is a posterior X stop fixation at L4-L5. Paraspinal and other soft tissues: The paraspinal soft tissues and visualized retroperitoneal structures are unremarkable. The sacroiliac joints are intact. Scattered aortic atherosclerosis is. Disc levels: Mild disc height loss with facet arthropathy seen throughout the lumbar spine. No significant canal or neural foraminal stenosis however is noted IMPRESSION: Age indeterminate slight superior compression deformities of the L3 and L4 vertebral bodies with less than 25% loss in height. Electronically Signed   By: Prudencio Pair M.D.   On: 01/01/2020 01:35   Results for orders placed or performed during the hospital encounter of 12/31/19  SARS Coronavirus 2 by RT PCR (hospital order, performed in [facility]) Nasopharyngeal Nasopharyngeal Swab   Specimen: Nasopharyngeal Swab  Result Value Ref Range   SARS Coronavirus 2 NEGATIVE NEGATIVE  Basic metabolic panel  Result Value Ref Range   Sodium 142 135 - 145 mmol/L   Potassium 3.8 3.5 - 5.1 mmol/L   Chloride 105 98 - 111 mmol/L   CO2 26 22 - 32 mmol/L   Glucose, Bld 86 70 - 99 mg/dL   BUN 28 (H) 8 - 23 mg/dL   Creatinine, Ser 0.67 0.44 - 1.00 mg/dL   Calcium 9.2 8.9 - 10.3 mg/dL   GFR calc non Af Amer >60 >60 mL/min   GFR calc Af Amer >60 >60 mL/min   Anion gap 11 5 - 15  CBC  Result Value Ref Range   WBC 8.2 4.0 - 10.5 K/uL   RBC 3.86 (L) 3.87 - 5.11 MIL/uL   Hemoglobin 11.0 (L) 12.0 - 15.0 g/dL   HCT 33.1 (L) 36 - 46 %   MCV 85.8 80.0 - 100.0 fL   MCH 28.5 26.0 - 34.0 pg   MCHC 33.2 30.0 - 36.0 g/dL   RDW 13.8 11.5 - 15.5 %   Platelets 213 150 - 400 K/uL   nRBC 0.0 0.0 - 0.2 %  Protime-INR  Result Value Ref Range   Prothrombin Time 13.8 11.4 - 15.2 seconds   INR 1.1 0.8 - 1.2  APTT  Result Value Ref Range   aPTT 34 24 - 36 seconds  Urinalysis, Routine w  reflex microscopic Urine, Catheterized  Result Value Ref Range   Color, Urine STRAW (A) YELLOW   APPearance CLEAR CLEAR   Specific Gravity, Urine >1.046 (H) 1.005 - 1.030   pH 7.0 5.0 - 8.0   Glucose, UA NEGATIVE NEGATIVE mg/dL   Hgb urine dipstick SMALL (A) NEGATIVE   Bilirubin Urine NEGATIVE NEGATIVE   Ketones, ur NEGATIVE NEGATIVE mg/dL   Protein, ur NEGATIVE NEGATIVE mg/dL   Nitrite NEGATIVE NEGATIVE   Leukocytes,Ua NEGATIVE NEGATIVE   RBC / HPF 0-5 0 - 5 RBC/hpf   WBC, UA 0-5 0 - 5 WBC/hpf   Bacteria, UA NONE SEEN NONE SEEN   Squamous Epithelial / LPF 0-5 0 - 5   Mucus PRESENT     Laboratory interpretation all normal except mild anemia      EKG Interpretation  Date/Time:  Saturday January 01 2020 02:53:59 EDT Ventricular Rate:  71 PR Interval:    QRS Duration: 86 QT Interval:  401 QTC Calculation: 436 R Axis:   -3 Text Interpretation: Sinus rhythm Ventricular premature complex Low voltage, extremity and precordial leads No significant change since last tracing 10 Dec 2019 Confirmed by Rolland Porter [phone]) on 01/01/2020 3:09:50 AM  Diagnoses that have been ruled out:  None  Diagnoses that are still under consideration:  None  Final diagnoses:  Fall  Fall at nursing home, initial encounter  Closed fracture of right hip, initial encounter [facility])  Closed fracture of multiple ribs of right side, initial encounter   Plan admission  Rolland Porter, MD, Barbette Or, MD 01/01/20 [place], [place], MD 01/01/20 [phone]

## 2020-01-01 NOTE — Progress Notes (Addendum)
PROGRESS NOTE    Patient: Stacy Davis                            PCP: Susy Frizzle, MD                    DOB: 1945/11/03            DOA: 12/31/2019 GYJ:[id number]             DOS: 01/01/2020, 7:19 AM   LOS: 0 days   Date of Service: The patient was seen and examined on 01/01/2020  Subjective:   The patient was seen and examined this Am. Hemodynamically stable Sleeping comfortably.. Pleasantly demented, easily arousable   Brief Narrative:    Stacy Davis is a 74 y.o. female with history of advanced dementia, hypothyroidism, seizures, depression and hypothyroidism was brought to the ER after patient had a fall while walking and tripped on the walker.  Patient was complaining of right hip pain was brought to the ER.  ED: Patient had CT head C-spine and CT abdomen and pelvis.   CT scan showed right-sided comminuted impacted nondisplaced fracture of the right greater trochanter and nondisplaced fracture of the right 10th and 11th rib.   There was also an age-indeterminate compression fracture of L3 and L4.  Lab work was significant for hemoglobin of 11/ Covid test was negative.    Orthopedic surgeon Dr. Veverly Fells consulted.     Assessment & Plan:   Active Problems:   HTN (hypertension)   Memory loss   Hypothyroid   Von Willebrand disease ([place])   Dementia ([place])   Trochanteric fracture ([place])   Closed rib fracture   Anemia  Right hip fracture: -Remained stable -Continue pain management -Pending Ortho evaluation -N.p.o.  Right-sided commuted impacted nondisplaced fracture of the right greater trochanter and also fractures of the right 10th and 11th rib and age-indeterminate fracture of the lumbar L3-L4 spine after fall for which Dr. Veverly Fells orthopedic surgeon has been consulted.     Hypothyroidism on Synthroid.  History of seizures on Keppra which have dosed as IV for now.  Anemia appears to be chronic follow CBC.  Advance Dementia and depression on Seroquel  and Zoloft.  History of von Willebrand disease ... -Heme-onc Dr. Alen Blew consulted for recommendations -   ---------------------------------------------------------------------------------------------------------------------------- DVT prophylaxis:  SCD/Compression stockings Code Status:   Code Status: Full Code Family Communication: No family member present at bedside- attempt Was made to call his son POA Mr. Stacy Davis at [phone] message was left  -Advance care planning has been discussed.   Admission status:    Status is: Observation  The patient remains OBS appropriate and will d/c before 2 midnights.  Dispo: The patient is from: SNF              Anticipated d/c is to: SNF              Anticipated d/c date is: 3 days              Patient currently is not medically stable to d/c.        Procedures:       Antimicrobials:  Anti-infectives (From admission, onward)   None       Medication:  . levothyroxine  88 mcg Oral Daily  . melatonin  3 mg Oral QHS  . pantoprazole  40 mg Oral Daily  . QUEtiapine  25 mg  Oral QHS  . sertraline  50 mg Oral Daily    LORazepam, morphine injection, ondansetron **OR** ondansetron (ZOFRAN) IV   Objective:   Vitals:   12/31/19 2306 01/01/20 0115 01/01/20 0317 01/01/20 0515  BP: 119/83 (!) 127/50 (!) 131/94 120/67  Pulse: (!) 111 77 75 62  Resp: 20 16 (!) 22 12  Temp:      TempSrc:      SpO2: 100% (!) 89% 98% 97%  Weight:      Height:       No intake or output data in the 24 hours ending 01/01/20 0719 Filed Weights   12/31/19 2031  Weight: 52.8 kg     Examination:   Physical Exam  Constitution: Pleasantly demented, no agitation, awake HEENT: Normocephalic, PERRL, otherwise with in Normal limits  Chest:Chest symmetric Cardio vascular:  S1/S2, RRR, No murmure, No Rubs or Gallops  pulmonary: Clear to auscultation bilaterally, respirations unlabored, negative wheezes / crackles Abdomen: Soft,  non-tender, non-distended, bowel sounds,no masses, no organomegaly Muscular skeletal: Limited exam -laying in bed, arm in a cast, limited range of motions of lower extremity Neuro: Limited exam -demented -CNII-XII intact. , normal sensation, reflexes intact  Extremities: No pitting edema lower extremities, +2 pulses  Skin: Dry, warm to touch, negative for any Rashes, No open wounds Wounds: per nursing documentation    ----------------------------------------------------------------------------------------------------------------------------    LABs:  CBC Latest Ref Rng & Units 01/01/2020 12/31/2019 12/10/2019  WBC 4.0 - 10.5 K/uL 5.9 8.2 6.0  Hemoglobin 12.0 - 15.0 g/dL 10.6(L) 11.0(L) 11.3(L)  Hematocrit 36 - 46 % 32.3(L) 33.1(L) 34.2(L)  Platelets 150 - 400 K/uL 185 213 171   CMP Latest Ref Rng & Units 01/01/2020 12/31/2019 12/10/2019  Glucose 70 - 99 mg/dL 88 86 105(H)  BUN 8 - 23 mg/dL 22 28(H) 27(H)  Creatinine 0.44 - 1.00 mg/dL 0.59 0.67 0.71  Sodium 135 - 145 mmol/L 137 142 142  Potassium 3.5 - 5.1 mmol/L 3.5 3.8 4.1  Chloride 98 - 111 mmol/L 103 105 106  CO2 22 - 32 mmol/L 25 26 27   Calcium 8.9 - 10.3 mg/dL 8.9 9.2 9.3  Total Protein 6.5 - 8.1 g/dL - - 7.0  Total Bilirubin 0.3 - 1.2 mg/dL - - 0.7  Alkaline Phos 38 - 126 U/L - - 106  AST 15 - 41 U/L - - 39  ALT 0 - 44 U/L - - 31       Micro Results Recent Results (from the past 240 hour(s))  SARS Coronavirus 2 by RT PCR (hospital order, performed in [facility] hospital lab) Nasopharyngeal Nasopharyngeal Swab     Status: None   Collection Time: 01/01/20  3:22 AM   Specimen: Nasopharyngeal Swab  Result Value Ref Range Status   SARS Coronavirus 2 NEGATIVE NEGATIVE Final    Comment: (NOTE) SARS-CoV-2 target nucleic acids are NOT DETECTED.  The SARS-CoV-2 RNA is generally detectable in upper and lower respiratory specimens during the acute phase of infection. The lowest concentration of SARS-CoV-2 viral copies this assay can  detect is 250 copies / mL. A negative result does not preclude SARS-CoV-2 infection and should not be used as the sole basis for treatment or other patient management decisions.  A negative result may occur with improper specimen collection / handling, submission of specimen other than nasopharyngeal swab, presence of viral mutation(s) within the areas targeted by this assay, and inadequate number of viral copies (<250 copies / mL). A negative result must be combined with clinical  observations, patient history, and epidemiological information.  Fact Sheet for Patients:   StrictlyIdeas.no  Fact Sheet for Healthcare Providers: BankingDealers.co.za  This test is not yet approved or  cleared by the [place] FDA and has been authorized for detection and/or diagnosis of SARS-CoV-2 by FDA under an Emergency Use Authorization (EUA).  This EUA will remain in effect (meaning this test can be used) for the duration of the COVID-19 declaration under Section 564(b)(1) of the Act, 21 U.S.C. section 360bbb-3(b)(1), unless the authorization is terminated or revoked sooner.  Performed at [facility], [place] [address], [place], [place] [address]     Radiology Reports DG Wrist Complete Right  Result Date: 12/24/2019 CLINICAL DATA:  Fall, pain EXAM: RIGHT WRIST - COMPLETE 3+ VIEW COMPARISON:  November 01, 2019 FINDINGS: There is a transverse acute fracture of the distal right radius with dorsal angulation. No substantial displacement identified. No definite radiocarpal extension. IMPRESSION: Acute transverse fracture of the distal right radius with dorsal angulation. Electronically Signed   By: Macy Mis M.D.   On: 12/24/2019 13:23   CT Head Wo Contrast  Result Date: 12/31/2019 CLINICAL DATA:  Fall EXAM: CT HEAD WITHOUT CONTRAST TECHNIQUE: Contiguous axial images were obtained from the base of the skull through the vertex without  intravenous contrast. COMPARISON:  December 24, 2019 FINDINGS: Brain: No evidence of acute territorial infarction, hemorrhage, hydrocephalus,extra-axial collection or mass lesion/mass effect. There is dilatation the ventricles and sulci consistent with age-related atrophy. Low-attenuation changes in the deep white matter consistent with small vessel ischemia. Vascular: No hyperdense vessel or unexpected calcification. Skull: The skull is intact. No fracture or focal lesion identified. Sinuses/Orbits: The visualized paranasal sinuses and mastoid air cells are clear. The orbits and globes intact. Other: None Cervical spine: Somewhat limited due to patient motion. Alignment: There is a minimal anterolisthesis of C3 on C4 measuring 2 mm. Skull base and vertebrae: Visualized skull base is intact. No atlanto-occipital dissociation. The vertebral body heights are well maintained. No fracture or pathologic osseous lesion seen. Soft tissues and spinal canal: The visualized paraspinal soft tissues are unremarkable. No prevertebral soft tissue swelling is seen. The spinal canal is grossly unremarkable, no large epidural collection or significant canal narrowing. Disc levels: Multilevel cervical spine spondylosis is seen with anterior osteophytes disc osteophyte complex and uncovertebral osteophytes most notable at C5-C6 with severe neural foraminal narrowing and mild to moderate central canal stenosis. Upper chest: The lung apices are clear. Thoracic inlet is within normal limits. Other: None IMPRESSION: No acute intracranial abnormality. Findings consistent with age related atrophy and chronic small vessel ischemia No acute fracture or malalignment of the spine. Cervical spine spondylosis most notable at C5-C6. Electronically Signed   By: Prudencio Pair M.D.   On: 12/31/2019 21:22   CT Head Wo Contrast  Result Date: 12/24/2019 CLINICAL DATA:  Head trauma, minor. Unwitnessed fall, neck pain, dementia. EXAM: CT HEAD WITHOUT  CONTRAST CT CERVICAL SPINE WITHOUT CONTRAST TECHNIQUE: Multidetector CT imaging of the head and cervical spine was performed following the standard protocol without intravenous contrast. Multiplanar CT image reconstructions of the cervical spine were also generated. COMPARISON:  Head CT 12/10/2019, CT cervical spine 12/10/2019. FINDINGS: CT HEAD FINDINGS Brain: Stable mild generalized parenchymal atrophy and chronic small vessel ischemic disease. There is no acute intracranial hemorrhage. No demarcated cortical infarct. No extra-axial fluid collection. No evidence of intracranial mass. No midline shift. Vascular: No hyperdense vessel. Skull: Normal. Negative for fracture or focal lesion. Sinuses/Orbits: Visualized orbits  show no acute finding. Mild ethmoid sinus mucosal thickening. No significant mastoid effusion. CT CERVICAL SPINE FINDINGS Alignment: Straightening of the expected cervical lordosis. Unchanged 2 mm C3-C4 grade 1 anterolisthesis. Skull base and vertebrae: The basion-dental and atlanto-dental intervals are maintained.No evidence of acute fracture to the cervical spine. Soft tissues and spinal canal: No prevertebral fluid or swelling. No visible canal hematoma. Disc levels: Cervical spondylosis with multilevel disc space narrowing, posterior disc osteophytes, uncovertebral and facet hypertrophy. Disc space narrowing is severe at the C4-C5 through C7-T1 levels. Degenerative fusion across the C4-C5 and C5-C6 disc spaces. Additionally, there is degenerative fusion across the facet joints bilaterally at C4-C5. A posterior disc osteophyte complex contributes to mild/moderate bony spinal canal stenosis at C5-C6. Multilevel bony neural foraminal narrowing. Upper chest: No consolidation within the imaged lung apices. No visible pneumothorax. IMPRESSION: CT head: 1. No evidence of acute intracranial abnormality. 2. Stable mild generalized parenchymal atrophy and chronic small vessel ischemic disease. 3. Mild  ethmoid sinus mucosal thickening. CT cervical spine: 1. No evidence of acute fracture to the cervical spine. 2. Unchanged 2 mm C3-C4 grade 1 anterolisthesis. 3. Advanced cervical spondylosis as outlined. Electronically Signed   By: Kellie Simmering DO   On: 12/24/2019 13:11   CT Head Wo Contrast  Result Date: 12/10/2019 CLINICAL DATA:  Posttraumatic headache after witnessed fall. EXAM: CT HEAD WITHOUT CONTRAST CT CERVICAL SPINE WITHOUT CONTRAST TECHNIQUE: Multidetector CT imaging of the head and cervical spine was performed following the standard protocol without intravenous contrast. Multiplanar CT image reconstructions of the cervical spine were also generated. COMPARISON:  December 06, 2019. FINDINGS: CT HEAD FINDINGS Brain: Mild diffuse cortical atrophy is noted. Mild chronic ischemic white matter disease is noted. No mass effect or midline shift is noted. Ventricular size is within normal limits. There is no evidence of mass lesion, hemorrhage or acute infarction. Vascular: No hyperdense vessel or unexpected calcification. Skull: Normal. Negative for fracture or focal lesion. Sinuses/Orbits: No acute finding. Other: None. CT CERVICAL SPINE FINDINGS Alignment: Mild grade 1 anterolisthesis of C3-4 is noted secondary to posterior facet joint hypertrophy. Skull base and vertebrae: No acute fracture. No primary bone lesion or focal pathologic process. Soft tissues and spinal canal: No prevertebral fluid or swelling. No visible canal hematoma. Disc levels: Severe degenerative disc disease is noted at C4-5, C5-6, C6-7 and C7-T1. Upper chest: Negative. Other: Degenerative changes are seen involving posterior facet joints bilaterally. IMPRESSION: 1. Mild diffuse cortical atrophy. Mild chronic ischemic white matter disease. No acute intracranial abnormality seen. 2. Severe multilevel degenerative disc disease. No acute abnormality seen in the cervical spine. Electronically Signed   By: Marijo Conception M.D.   On: 12/10/2019  18:34   CT Head Wo Contrast  Result Date: 12/06/2019 CLINICAL DATA:  Unwitnessed fall, possible seizure, history of dementia EXAM: CT HEAD WITHOUT CONTRAST CT CERVICAL SPINE WITHOUT CONTRAST TECHNIQUE: Multidetector CT imaging of the head and cervical spine was performed following the standard protocol without intravenous contrast. Multiplanar CT image reconstructions of the cervical spine were also generated. COMPARISON:  11/01/2019 FINDINGS: CT HEAD FINDINGS Brain: No evidence of acute infarction, hemorrhage, hydrocephalus, extra-axial collection or mass lesion/mass effect. Periventricular and deep white matter hypodensity. Vascular: No hyperdense vessel or unexpected calcification. Skull: Normal. Negative for fracture or focal lesion. Sinuses/Orbits: No acute finding. Other: None. CT CERVICAL SPINE FINDINGS Alignment: Normal. Skull base and vertebrae: No acute fracture. No primary bone lesion or focal pathologic process. Soft tissues and spinal canal: No prevertebral fluid  or swelling. No visible canal hematoma. Disc levels: Moderate to severe disc space height loss and osteophytosis of the lower cervical spine, worst at C5-C6. Upper chest: Negative. Other: None. IMPRESSION: 1. No acute intracranial pathology. Small-vessel white matter disease. 2. No fracture or static subluxation of the cervical spine. 3. Moderate to severe disc space height loss and osteophytosis of the lower cervical spine, worst at C5-C6. Electronically Signed   By: Eddie Candle M.D.   On: 12/06/2019 14:10   CT Cervical Spine Wo Contrast  Result Date: 12/31/2019 CLINICAL DATA:  Fall EXAM: CT HEAD WITHOUT CONTRAST TECHNIQUE: Contiguous axial images were obtained from the base of the skull through the vertex without intravenous contrast. COMPARISON:  December 24, 2019 FINDINGS: Brain: No evidence of acute territorial infarction, hemorrhage, hydrocephalus,extra-axial collection or mass lesion/mass effect. There is dilatation the ventricles  and sulci consistent with age-related atrophy. Low-attenuation changes in the deep white matter consistent with small vessel ischemia. Vascular: No hyperdense vessel or unexpected calcification. Skull: The skull is intact. No fracture or focal lesion identified. Sinuses/Orbits: The visualized paranasal sinuses and mastoid air cells are clear. The orbits and globes intact. Other: None Cervical spine: Somewhat limited due to patient motion. Alignment: There is a minimal anterolisthesis of C3 on C4 measuring 2 mm. Skull base and vertebrae: Visualized skull base is intact. No atlanto-occipital dissociation. The vertebral body heights are well maintained. No fracture or pathologic osseous lesion seen. Soft tissues and spinal canal: The visualized paraspinal soft tissues are unremarkable. No prevertebral soft tissue swelling is seen. The spinal canal is grossly unremarkable, no large epidural collection or significant canal narrowing. Disc levels: Multilevel cervical spine spondylosis is seen with anterior osteophytes disc osteophyte complex and uncovertebral osteophytes most notable at C5-C6 with severe neural foraminal narrowing and mild to moderate central canal stenosis. Upper chest: The lung apices are clear. Thoracic inlet is within normal limits. Other: None IMPRESSION: No acute intracranial abnormality. Findings consistent with age related atrophy and chronic small vessel ischemia No acute fracture or malalignment of the spine. Cervical spine spondylosis most notable at C5-C6. Electronically Signed   By: Prudencio Pair M.D.   On: 12/31/2019 21:22   CT Cervical Spine Wo Contrast  Result Date: 12/24/2019 CLINICAL DATA:  Head trauma, minor. Unwitnessed fall, neck pain, dementia. EXAM: CT HEAD WITHOUT CONTRAST CT CERVICAL SPINE WITHOUT CONTRAST TECHNIQUE: Multidetector CT imaging of the head and cervical spine was performed following the standard protocol without intravenous contrast. Multiplanar CT image  reconstructions of the cervical spine were also generated. COMPARISON:  Head CT 12/10/2019, CT cervical spine 12/10/2019. FINDINGS: CT HEAD FINDINGS Brain: Stable mild generalized parenchymal atrophy and chronic small vessel ischemic disease. There is no acute intracranial hemorrhage. No demarcated cortical infarct. No extra-axial fluid collection. No evidence of intracranial mass. No midline shift. Vascular: No hyperdense vessel. Skull: Normal. Negative for fracture or focal lesion. Sinuses/Orbits: Visualized orbits show no acute finding. Mild ethmoid sinus mucosal thickening. No significant mastoid effusion. CT CERVICAL SPINE FINDINGS Alignment: Straightening of the expected cervical lordosis. Unchanged 2 mm C3-C4 grade 1 anterolisthesis. Skull base and vertebrae: The basion-dental and atlanto-dental intervals are maintained.No evidence of acute fracture to the cervical spine. Soft tissues and spinal canal: No prevertebral fluid or swelling. No visible canal hematoma. Disc levels: Cervical spondylosis with multilevel disc space narrowing, posterior disc osteophytes, uncovertebral and facet hypertrophy. Disc space narrowing is severe at the C4-C5 through C7-T1 levels. Degenerative fusion across the C4-C5 and C5-C6 disc spaces. Additionally, there  is degenerative fusion across the facet joints bilaterally at C4-C5. A posterior disc osteophyte complex contributes to mild/moderate bony spinal canal stenosis at C5-C6. Multilevel bony neural foraminal narrowing. Upper chest: No consolidation within the imaged lung apices. No visible pneumothorax. IMPRESSION: CT head: 1. No evidence of acute intracranial abnormality. 2. Stable mild generalized parenchymal atrophy and chronic small vessel ischemic disease. 3. Mild ethmoid sinus mucosal thickening. CT cervical spine: 1. No evidence of acute fracture to the cervical spine. 2. Unchanged 2 mm C3-C4 grade 1 anterolisthesis. 3. Advanced cervical spondylosis as outlined.  Electronically Signed   By: Kellie Simmering DO   On: 12/24/2019 13:11   CT Cervical Spine Wo Contrast  Result Date: 12/10/2019 CLINICAL DATA:  Posttraumatic headache after witnessed fall. EXAM: CT HEAD WITHOUT CONTRAST CT CERVICAL SPINE WITHOUT CONTRAST TECHNIQUE: Multidetector CT imaging of the head and cervical spine was performed following the standard protocol without intravenous contrast. Multiplanar CT image reconstructions of the cervical spine were also generated. COMPARISON:  December 06, 2019. FINDINGS: CT HEAD FINDINGS Brain: Mild diffuse cortical atrophy is noted. Mild chronic ischemic white matter disease is noted. No mass effect or midline shift is noted. Ventricular size is within normal limits. There is no evidence of mass lesion, hemorrhage or acute infarction. Vascular: No hyperdense vessel or unexpected calcification. Skull: Normal. Negative for fracture or focal lesion. Sinuses/Orbits: No acute finding. Other: None. CT CERVICAL SPINE FINDINGS Alignment: Mild grade 1 anterolisthesis of C3-4 is noted secondary to posterior facet joint hypertrophy. Skull base and vertebrae: No acute fracture. No primary bone lesion or focal pathologic process. Soft tissues and spinal canal: No prevertebral fluid or swelling. No visible canal hematoma. Disc levels: Severe degenerative disc disease is noted at C4-5, C5-6, C6-7 and C7-T1. Upper chest: Negative. Other: Degenerative changes are seen involving posterior facet joints bilaterally. IMPRESSION: 1. Mild diffuse cortical atrophy. Mild chronic ischemic white matter disease. No acute intracranial abnormality seen. 2. Severe multilevel degenerative disc disease. No acute abnormality seen in the cervical spine. Electronically Signed   By: Marijo Conception M.D.   On: 12/10/2019 18:34   CT Cervical Spine Wo Contrast  Result Date: 12/06/2019 CLINICAL DATA:  Unwitnessed fall, possible seizure, history of dementia EXAM: CT HEAD WITHOUT CONTRAST CT CERVICAL SPINE WITHOUT  CONTRAST TECHNIQUE: Multidetector CT imaging of the head and cervical spine was performed following the standard protocol without intravenous contrast. Multiplanar CT image reconstructions of the cervical spine were also generated. COMPARISON:  11/01/2019 FINDINGS: CT HEAD FINDINGS Brain: No evidence of acute infarction, hemorrhage, hydrocephalus, extra-axial collection or mass lesion/mass effect. Periventricular and deep white matter hypodensity. Vascular: No hyperdense vessel or unexpected calcification. Skull: Normal. Negative for fracture or focal lesion. Sinuses/Orbits: No acute finding. Other: None. CT CERVICAL SPINE FINDINGS Alignment: Normal. Skull base and vertebrae: No acute fracture. No primary bone lesion or focal pathologic process. Soft tissues and spinal canal: No prevertebral fluid or swelling. No visible canal hematoma. Disc levels: Moderate to severe disc space height loss and osteophytosis of the lower cervical spine, worst at C5-C6. Upper chest: Negative. Other: None. IMPRESSION: 1. No acute intracranial pathology. Small-vessel white matter disease. 2. No fracture or static subluxation of the cervical spine. 3. Moderate to severe disc space height loss and osteophytosis of the lower cervical spine, worst at C5-C6. Electronically Signed   By: Eddie Candle M.D.   On: 12/06/2019 14:10   CT ABDOMEN PELVIS W CONTRAST  Result Date: 01/01/2020 CLINICAL DATA:  Fall and tripped, right  hip pain EXAM: CT ABDOMEN AND PELVIS WITH CONTRAST TECHNIQUE: Multidetector CT imaging of the abdomen and pelvis was performed using the standard protocol following bolus administration of intravenous contrast. CONTRAST:  19mL OMNIPAQUE IOHEXOL 300 MG/ML  SOLN COMPARISON:  Radiograph same day FINDINGS: Hepatobiliary: Homogeneous hepatic attenuation without traumatic injury. No focal lesion. Gallbladder physiologically distended, no calcified stone. No biliary dilatation. Pancreas: No evidence for traumatic injury. Fatty  atrophy of the pancreas is noted. No ductal dilatation or inflammation. Spleen: Homogeneous attenuation without traumatic injury. Normal in size. Adrenals/Urinary Tract: No adrenal hemorrhage. Kidneys demonstrate symmetric enhancement and excretion on delayed phase imaging. No evidence or renal injury. Ureters are well opacified proximal through mid portion. Bladder is physiologically distended without wall thickening. Stomach/Bowel: Suboptimally assessed without enteric contrast, allowing for this, no evidence of bowel injury. Stomach physiologically distended. There are no dilated or thickened small or large bowel loops. Moderate stool burden. No evidence of mesenteric hematoma. No free air free fluid. Vascular/Lymphatic: No acute vascular injury. The abdominal aorta and IVC are intact. Scattered aortic atherosclerotic calcifications are seen without aneurysmal dilatation. No evidence of retroperitoneal, abdominal, or pelvic adenopathy. Reproductive: No acute abnormality. Other: No focal contusion or abnormality of the abdominal wall. Musculoskeletal: There is a comminuted slightly impacted fracture involving the right femoral greater tuberosity. Overlying soft tissue swelling seen along the right lateral hip. There is also nondisplaced fractures of the posterior right tenth and eleventh ribs. There appears to be age indeterminate, slight compression deformities of the L3 and L4 vertebral bodies with less than 25% loss in height. A x stop fixation is noted at L4-L5. IMPRESSION: Comminuted impacted nondisplaced fracture of the right greater trochanter. Nondisplaced posterior right tenth and eleventh rib fractures. Age indeterminate slight superior compression deformities of the L3 and L4 vertebral bodies with less than 25% loss height. No acute intra-abdominal or pelvic injury. Aortic Atherosclerosis (ICD10-I70.0). Electronically Signed   By: Prudencio Pair M.D.   On: 01/01/2020 01:29   CT L-SPINE NO  CHARGE  Result Date: 01/01/2020 CLINICAL DATA:  Fall and pain EXAM: CT LUMBAR SPINE WITHOUT CONTRAST TECHNIQUE: Multidetector CT imaging of the lumbar spine was performed without intravenous contrast administration. Multiplanar CT image reconstructions were also generated. COMPARISON:  None. FINDINGS: Segmentation: There are 5 non-rib bearing lumbar type vertebral bodies with the last intervertebral disc space labeled as L5-S1. 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 A grade 1 anterolisthesis of L4 on L5 is seen measuring 4 mm. Vertebrae: There is age indeterminate slight superior compression deformity of the L3 and L4 vertebral bodies with less than 25% loss in height. There is a posterior X stop fixation at L4-L5. Paraspinal and other soft tissues: The paraspinal soft tissues and visualized retroperitoneal structures are unremarkable. The sacroiliac joints are intact. Scattered aortic atherosclerosis is. Disc levels: Mild disc height loss with facet arthropathy seen throughout the lumbar spine. No significant canal or neural foraminal stenosis however is noted IMPRESSION: Age indeterminate slight superior compression deformities of the L3 and L4 vertebral bodies with less than 25% loss in height. Electronically Signed   By: Prudencio Pair M.D.   On: 01/01/2020 01:35   DG Chest Portable 1 View  Result Date: 12/31/2019 CLINICAL DATA:  Fall EXAM: PORTABLE CHEST 1 VIEW COMPARISON:  06/14/2018 FINDINGS: The heart size and mediastinal contours are within normal limits. Both lungs are clear. The visualized skeletal structures are unremarkable. IMPRESSION: No active disease. Electronically Signed   By: Maudie Mercury  Francoise Ceo M.D.   On: 12/31/2019 22:43   DG Hip Unilat W or Wo Pelvis 2-3 Views Right  Result Date: 12/31/2019 CLINICAL DATA:  Trip and fall injury with right-sided hip pain. EXAM: DG HIP (WITH OR WITHOUT PELVIS) 2-3V RIGHT COMPARISON:  06/14/2018 mild degenerative changes in the  right hip. No change in appearance since prior study. No evidence of acute fracture or dislocation. No focal bone lesion or bone destruction. Pelvis and sacrum appear intact. Degenerative changes in the lower lumbar spine. FINDINGS: Mild degenerative changes in the right hip. No change in appearance since prior study. No evidence of acute fracture or dislocation. No focal bone lesion or bone destruction. Pelvis and sacrum appear intact. Degenerative changes in the lower lumbar spine. IMPRESSION: No acute bony abnormalities. Electronically Signed   By: Lucienne Capers M.D.   On: 12/31/2019 21:07    SIGNED: Deatra James, MD, FACP, FHM. [facility],  Pager (please use [email or web address] to page/text)  If 7PM-7AM, please contact night-coverage [email or web address] Ota [facility] 01/01/2020, 7:19 AM

## 2020-01-01 NOTE — Consult Note (Signed)
Reason for Consult:Right hip pain after fall Referring Physician: Roger Shelter MD  Stacy Davis is an 74 y.o. female.  HPI: Patient presents for evaluation after a fall at SNF where she is a resident. Patient complains of mild right hip pain.  She is still recovering from a recent fall and displaced right wrist fracture which is being treated in an above elbow splint.   Past Medical History:  Diagnosis Date  . Anxiety   . Arthritis    ?OA vs Rheum (established with Dr. Jefm Bryant pending blood work)  . Articular cartilage disorder of shoulder 04/2012   right  . Back pain    h/o DDD since 2010  . Basal cell carcinoma    right dorsum nose, right forhead  . Dementia ([place])   . Dementia ([place])   . Dental crowns present    also dental caps  . Diabetes mellitus    NIDDM  . Fecal incontinence   . GERD (gastroesophageal reflux disease)   . Headache(784.0)    occasional  . History of skin cancer   . Hyperlipidemia    no current med.  . Hypertension   . Hyperthyroidism   . Hypothyroid   . Impingement syndrome of right shoulder 04/2012  . Memory impairment    dementia- worsenign 12/2016 with medication non compliance and delusions.    . Orthostatic dizziness   . Right rotator cuff tear 05/15/2012  . Rotator cuff rupture 04/2012   right  . Seizures ([place])   . 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 Von Willebrand disease ([place])     Past Surgical History:  Procedure Laterality Date  . APPENDECTOMY    . BREAST SURGERY     hematoma evacuation due to trauma  . CARDIAC CATHETERIZATION  03/06/2001  . CESAREAN SECTION     x 2  . COLONOSCOPY  01/20/2006 per patient  . FRACTURE SURGERY    . KNEE ARTHROSCOPY Right 12/01/2014   Procedure: ARTHROSCOPY KNEE,partial medial menisectomy and plica excision;  Surgeon: Hessie Knows, MD;  Location: [facility];  Service: Orthopedics;  Laterality: Right;  . LUMBAR LAMINECTOMY/DECOMPRESSION MICRODISCECTOMY  03/01/2009   right  L4-5; arthrodesis L4-5  . ORIF WRIST FRACTURE     left  . SHOULDER ARTHROSCOPY WITH ROTATOR CUFF REPAIR AND SUBACROMIAL DECOMPRESSION  05/15/2012   Procedure: SHOULDER ARTHROSCOPY WITH ROTATOR CUFF REPAIR AND SUBACROMIAL DECOMPRESSION;  Surgeon: Johnny Bridge, MD;  Location: [place];  Service: Orthopedics;  Laterality: Right;  RIGHT ARTHROSCOPY SHOULDER DEBRIDEMENT LIMITED, ARTHROSCOPY SHOULDER DECOMPRESSION SUBACROMIAL PARTIAL ACROMIOPLASTY WITH CORACOACROMIAL RELEASE, ARTHROSCOPY SHOULDER WITH ROTATOR CUFF REPAIR  . TUBAL LIGATION      Family History  Problem Relation Age of Onset  . Dementia Mother   . Arthritis Mother   . Cancer Father        unknown primary  . Heart disease Father   . Thyroid disease Sister   . Cancer Sister        brain tumor  . Dementia Sister   . COPD Brother   . Cancer Brother        bone cancer  . Colon cancer Paternal Grandfather   . Breast cancer Neg Hx     Social History:  reports that she has never smoked. She has never used smokeless tobacco. She reports that she does not drink alcohol and does not use drugs.  Allergies:  Allergies  Allergen Reactions  . Tramadol Other (See Comments)  CHANGES IN MEMORY    Medications: I have reviewed the patient's current medications.  Results for orders placed or performed during the hospital encounter of 12/31/19 (from the past 48 hour(s))  Basic metabolic panel     Status: Abnormal   Collection Time: 12/31/19 12:01 AM  Result Value Ref Range   Sodium 142 135 - 145 mmol/L   Potassium 3.8 3.5 - 5.1 mmol/L   Chloride 105 98 - 111 mmol/L   CO2 26 22 - 32 mmol/L   Glucose, Bld 86 70 - 99 mg/dL    Comment: Glucose reference range applies only to samples taken after fasting for at least 8 hours.   BUN 28 (H) 8 - 23 mg/dL   Creatinine, Ser 0.67 0.44 - 1.00 mg/dL   Calcium 9.2 8.9 - 10.3 mg/dL   GFR calc non Af Amer >60 >60 mL/min   GFR calc Af Amer >60 >60 mL/min   Anion gap 11 5 -  15    Comment: Performed at [facility], [place] [address], [place], [place] [address]  CBC     Status: Abnormal   Collection Time: 12/31/19 12:01 AM  Result Value Ref Range   WBC 8.2 4.0 - 10.5 K/uL   RBC 3.86 (L) 3.87 - 5.11 MIL/uL   Hemoglobin 11.0 (L) 12.0 - 15.0 g/dL   HCT 33.1 (L) 36 - 46 %   MCV 85.8 80.0 - 100.0 fL   MCH 28.5 26.0 - 34.0 pg   MCHC 33.2 30.0 - 36.0 g/dL   RDW 13.8 11.5 - 15.5 %   Platelets 213 150 - 400 K/uL   nRBC 0.0 0.0 - 0.2 %    Comment: Performed at [facility], [place] [address], [place], [place] [address]  Protime-INR     Status: None   Collection Time: 01/01/20  2:02 AM  Result Value Ref Range   Prothrombin Time 13.8 11.4 - 15.2 seconds   INR 1.1 0.8 - 1.2    Comment: (NOTE) INR goal varies based on device and disease states. Performed at [facility], [place] [address] [place], [place] [address]   APTT     Status: None   Collection Time: 01/01/20  2:02 AM  Result Value Ref Range   aPTT 34 24 - 36 seconds    Comment: Performed at [facility], [place] [address] [place], [place] [address]  Urinalysis, Routine w reflex microscopic Urine, Catheterized     Status: Abnormal   Collection Time: 01/01/20  2:02 AM  Result Value Ref Range   Color, Urine STRAW (A) YELLOW   APPearance CLEAR CLEAR   Specific Gravity, Urine >1.046 (H) 1.005 - 1.030   pH 7.0 5.0 - 8.0   Glucose, UA NEGATIVE NEGATIVE mg/dL   Hgb urine dipstick SMALL (A) NEGATIVE   Bilirubin Urine NEGATIVE NEGATIVE   Ketones, ur NEGATIVE NEGATIVE mg/dL   Protein, ur NEGATIVE NEGATIVE mg/dL   Nitrite NEGATIVE NEGATIVE   Leukocytes,Ua NEGATIVE NEGATIVE   RBC / HPF 0-5 0 - 5 RBC/hpf   WBC, UA 0-5 0 - 5 WBC/hpf   Bacteria, UA NONE SEEN NONE SEEN   Squamous Epithelial / LPF 0-5 0 - 5   Mucus PRESENT     Comment: Performed at [facility], [place] [address] [place], [place] [address]  Sample to Blood Bank      Status: None   Collection Time: 01/01/20  2:02 AM  Result Value Ref  Range   Blood Bank Specimen SAMPLE AVAILABLE FOR TESTING    Sample Expiration      01/04/2020,2359 Performed at [facility], [place] [address], [place], [place] [address]   SARS Coronavirus 2 by RT PCR (hospital order, performed in [facility] hospital lab) Nasopharyngeal Nasopharyngeal Swab     Status: None   Collection Time: 01/01/20  3:22 AM   Specimen: Nasopharyngeal Swab  Result Value Ref Range   SARS Coronavirus 2 NEGATIVE NEGATIVE    Comment: (NOTE) SARS-CoV-2 target nucleic acids are NOT DETECTED.  The SARS-CoV-2 RNA is generally detectable in upper and lower respiratory specimens during the acute phase of infection. The lowest concentration of SARS-CoV-2 viral copies this assay can detect is 250 copies / mL. A negative result does not preclude SARS-CoV-2 infection and should not be used as the sole basis for treatment or other patient management decisions.  A negative result may occur with improper specimen collection / handling, submission of specimen other than nasopharyngeal swab, presence of viral mutation(s) within the areas targeted by this assay, and inadequate number of viral copies (<250 copies / mL). A negative result must be combined with clinical observations, patient history, and epidemiological information.  Fact Sheet for Patients:   StrictlyIdeas.no  Fact Sheet for Healthcare Providers: BankingDealers.co.za  This test is not yet approved or  cleared by the [place] FDA and has been authorized for detection and/or diagnosis of SARS-CoV-2 by FDA under an Emergency Use Authorization (EUA).  This EUA will remain in effect (meaning this test can be used) for the duration of the COVID-19 declaration under Section 564(b)(1) of the Act, 21 U.S.C. section 360bbb-3(b)(1), unless the authorization is terminated or revoked  sooner.  Performed at [facility], [place] [address], [place], [place] [address]   Basic metabolic panel     Status: None   Collection Time: 01/01/20  5:00 AM  Result Value Ref Range   Sodium 137 135 - 145 mmol/L   Potassium 3.5 3.5 - 5.1 mmol/L   Chloride 103 98 - 111 mmol/L   CO2 25 22 - 32 mmol/L   Glucose, Bld 88 70 - 99 mg/dL    Comment: Glucose reference range applies only to samples taken after fasting for at least 8 hours.   BUN 22 8 - 23 mg/dL   Creatinine, Ser 0.59 0.44 - 1.00 mg/dL   Calcium 8.9 8.9 - 10.3 mg/dL   GFR calc non Af Amer >60 >60 mL/min   GFR calc Af Amer >60 >60 mL/min   Anion gap 9 5 - 15    Comment: Performed at [facility], [place] [address], [place], [place] [address]  CBC     Status: Abnormal   Collection Time: 01/01/20  5:00 AM  Result Value Ref Range   WBC 5.9 4.0 - 10.5 K/uL   RBC 3.78 (L) 3.87 - 5.11 MIL/uL   Hemoglobin 10.6 (L) 12.0 - 15.0 g/dL   HCT 32.3 (L) 36 - 46 %   MCV 85.4 80.0 - 100.0 fL   MCH 28.0 26.0 - 34.0 pg   MCHC 32.8 30.0 - 36.0 g/dL   RDW 13.6 11.5 - 15.5 %   Platelets 185 150 - 400 K/uL   nRBC 0.0 0.0 - 0.2 %    Comment: Performed at [facility], [place] [address] [place], [place] [address]  CBG monitoring, ED     Status: None   Collection Time: 01/01/20  8:16 AM  Result Value Ref Range   Glucose-Capillary 84 70 - 99 mg/dL    Comment: Glucose reference range applies only to samples taken after fasting for at least 8 hours.    CT Head Wo Contrast  Result Date: 12/31/2019 CLINICAL DATA:  Fall EXAM: CT HEAD WITHOUT CONTRAST TECHNIQUE: Contiguous axial images were obtained from the base of the skull through the vertex without intravenous contrast. COMPARISON:  December 24, 2019 FINDINGS: Brain: No evidence of acute territorial infarction, hemorrhage, hydrocephalus,extra-axial collection or mass lesion/mass effect. There is dilatation the ventricles and sulci consistent with  age-related atrophy. Low-attenuation changes in the deep white matter consistent with small vessel ischemia. Vascular: No hyperdense vessel or unexpected calcification. Skull: The skull is intact. No fracture or focal lesion identified. Sinuses/Orbits: The visualized paranasal sinuses and mastoid air cells are clear. The orbits and globes intact. Other: None Cervical spine: Somewhat limited due to patient motion. Alignment: There is a minimal anterolisthesis of C3 on C4 measuring 2 mm. Skull base and vertebrae: Visualized skull base is intact. No atlanto-occipital dissociation. The vertebral body heights are well maintained. No fracture or pathologic osseous lesion seen. Soft tissues and spinal canal: The visualized paraspinal soft tissues are unremarkable. No prevertebral soft tissue swelling is seen. The spinal canal is grossly unremarkable, no large epidural collection or significant canal narrowing. Disc levels: Multilevel cervical spine spondylosis is seen with anterior osteophytes disc osteophyte complex and uncovertebral osteophytes most notable at C5-C6 with severe neural foraminal narrowing and mild to moderate central canal stenosis. Upper chest: The lung apices are clear. Thoracic inlet is within normal limits. Other: None IMPRESSION: No acute intracranial abnormality. Findings consistent with age related atrophy and chronic small vessel ischemia No acute fracture or malalignment of the spine. Cervical spine spondylosis most notable at C5-C6. Electronically Signed   By: Prudencio Pair M.D.   On: 12/31/2019 21:22   CT Cervical Spine Wo Contrast  Result Date: 12/31/2019 CLINICAL DATA:  Fall EXAM: CT HEAD WITHOUT CONTRAST TECHNIQUE: Contiguous axial images were obtained from the base of the skull through the vertex without intravenous contrast. COMPARISON:  December 24, 2019 FINDINGS: Brain: No evidence of acute territorial infarction, hemorrhage, hydrocephalus,extra-axial collection or mass lesion/mass effect.  There is dilatation the ventricles and sulci consistent with age-related atrophy. Low-attenuation changes in the deep white matter consistent with small vessel ischemia. Vascular: No hyperdense vessel or unexpected calcification. Skull: The skull is intact. No fracture or focal lesion identified. Sinuses/Orbits: The visualized paranasal sinuses and mastoid air cells are clear. The orbits and globes intact. Other: None Cervical spine: Somewhat limited due to patient motion. Alignment: There is a minimal anterolisthesis of C3 on C4 measuring 2 mm. Skull base and vertebrae: Visualized skull base is intact. No atlanto-occipital dissociation. The vertebral body heights are well maintained. No fracture or pathologic osseous lesion seen. Soft tissues and spinal canal: The visualized paraspinal soft tissues are unremarkable. No prevertebral soft tissue swelling is seen. The spinal canal is grossly unremarkable, no large epidural collection or significant canal narrowing. Disc levels: Multilevel cervical spine spondylosis is seen with anterior osteophytes disc osteophyte complex and uncovertebral osteophytes most notable at C5-C6 with severe neural foraminal narrowing and mild to moderate central canal stenosis. Upper chest: The lung apices are clear. Thoracic inlet is within normal limits. Other: None IMPRESSION: No acute intracranial abnormality. Findings consistent with age related atrophy and chronic small vessel ischemia No acute fracture or malalignment of the spine. Cervical spine spondylosis most notable  at C5-C6. Electronically Signed   By: Prudencio Pair M.D.   On: 12/31/2019 21:22   CT ABDOMEN PELVIS W CONTRAST  Result Date: 01/01/2020 CLINICAL DATA:  Fall and tripped, right hip pain EXAM: CT ABDOMEN AND PELVIS WITH CONTRAST TECHNIQUE: Multidetector CT imaging of the abdomen and pelvis was performed using the standard protocol following bolus administration of intravenous contrast. CONTRAST:  175mL OMNIPAQUE  IOHEXOL 300 MG/ML  SOLN COMPARISON:  Radiograph same day FINDINGS: Hepatobiliary: Homogeneous hepatic attenuation without traumatic injury. No focal lesion. Gallbladder physiologically distended, no calcified stone. No biliary dilatation. Pancreas: No evidence for traumatic injury. Fatty atrophy of the pancreas is noted. No ductal dilatation or inflammation. Spleen: Homogeneous attenuation without traumatic injury. Normal in size. Adrenals/Urinary Tract: No adrenal hemorrhage. Kidneys demonstrate symmetric enhancement and excretion on delayed phase imaging. No evidence or renal injury. Ureters are well opacified proximal through mid portion. Bladder is physiologically distended without wall thickening. Stomach/Bowel: Suboptimally assessed without enteric contrast, allowing for this, no evidence of bowel injury. Stomach physiologically distended. There are no dilated or thickened small or large bowel loops. Moderate stool burden. No evidence of mesenteric hematoma. No free air free fluid. Vascular/Lymphatic: No acute vascular injury. The abdominal aorta and IVC are intact. Scattered aortic atherosclerotic calcifications are seen without aneurysmal dilatation. No evidence of retroperitoneal, abdominal, or pelvic adenopathy. Reproductive: No acute abnormality. Other: No focal contusion or abnormality of the abdominal wall. Musculoskeletal: There is a comminuted slightly impacted fracture involving the right femoral greater tuberosity. Overlying soft tissue swelling seen along the right lateral hip. There is also nondisplaced fractures of the posterior right tenth and eleventh ribs. There appears to be age indeterminate, slight compression deformities of the L3 and L4 vertebral bodies with less than 25% loss in height. A x stop fixation is noted at L4-L5. IMPRESSION: Comminuted impacted nondisplaced fracture of the right greater trochanter. Nondisplaced posterior right tenth and eleventh rib fractures. Age indeterminate  slight superior compression deformities of the L3 and L4 vertebral bodies with less than 25% loss height. No acute intra-abdominal or pelvic injury. Aortic Atherosclerosis (ICD10-I70.0). Electronically Signed   By: Prudencio Pair M.D.   On: 01/01/2020 01:29   CT L-SPINE NO CHARGE  Result Date: 01/01/2020 CLINICAL DATA:  Fall and pain EXAM: CT LUMBAR SPINE WITHOUT CONTRAST TECHNIQUE: Multidetector CT imaging of the lumbar spine was performed without intravenous contrast administration. Multiplanar CT image reconstructions were also generated. COMPARISON:  None. FINDINGS: Segmentation: There are 5 non-rib bearing lumbar type vertebral bodies with the last intervertebral disc space labeled as L5-S1. 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 A grade 1 anterolisthesis of L4 on L5 is seen measuring 4 mm. Vertebrae: There is age indeterminate slight superior compression deformity of the L3 and L4 vertebral bodies with less than 25% loss in height. There is a posterior X stop fixation at L4-L5. Paraspinal and other soft tissues: The paraspinal soft tissues and visualized retroperitoneal structures are unremarkable. The sacroiliac joints are intact. Scattered aortic atherosclerosis is. Disc levels: Mild disc height loss with facet arthropathy seen throughout the lumbar spine. No significant canal or neural foraminal stenosis however is noted IMPRESSION: Age indeterminate slight superior compression deformities of the L3 and L4 vertebral bodies with less than 25% loss in height. Electronically Signed   By: Prudencio Pair M.D.   On: 01/01/2020 01:35   DG Chest Portable 1 View  Result Date: 12/31/2019 CLINICAL DATA:  Fall EXAM: PORTABLE CHEST 1 VIEW COMPARISON:  06/14/2018 FINDINGS: The heart size and mediastinal contours are within normal limits. Both lungs are clear. The visualized skeletal structures are unremarkable. IMPRESSION: No active disease. Electronically Signed   By: Donavan Foil  M.D.   On: 12/31/2019 22:43   DG Hip Unilat W or Wo Pelvis 2-3 Views Right  Result Date: 12/31/2019 CLINICAL DATA:  Trip and fall injury with right-sided hip pain. EXAM: DG HIP (WITH OR WITHOUT PELVIS) 2-3V RIGHT COMPARISON:  06/14/2018 mild degenerative changes in the right hip. No change in appearance since prior study. No evidence of acute fracture or dislocation. No focal bone lesion or bone destruction. Pelvis and sacrum appear intact. Degenerative changes in the lower lumbar spine. FINDINGS: Mild degenerative changes in the right hip. No change in appearance since prior study. No evidence of acute fracture or dislocation. No focal bone lesion or bone destruction. Pelvis and sacrum appear intact. Degenerative changes in the lower lumbar spine. IMPRESSION: No acute bony abnormalities. Electronically Signed   By: Lucienne Capers M.D.   On: 12/31/2019 21:07    Review of Systems Blood pressure 98/62, pulse (!) 55, temperature 97.9 F (36.6 C), temperature source Oral, resp. rate (!) 9, height 5\' 3"  (1.6 m), weight 52.8 kg, SpO2 99 %. Physical Exam Patient awake and responsive laying on ED stretcher. Right UE immobilized in a sugar tong splint. Wiggles fingers, minimal swelling Left UE with pain free PROM No tenderness over the T and L spine Right hip flexed up and knee bent. No pain with gentle AROM and PROM Left LE with pain free PROM.  Assessment/Plan: Right trochanteric proximal femur fracture after fall at SNF Patient should be able to mobilize with this injury. No surgery required as there is no extension into the femoral neck or the intertrochanteric area Ok to feed her.  PT consult for mobilization with minimal WB on the right hip (25%) Will need outpatient follow up in three weeks in the office to check fracture healing  Augustin Schooling 01/01/2020, 10:23 AM

## 2020-01-01 NOTE — H&P (Signed)
History and Physical    Stacy Davis JOI:[id number] DOB: 08-13-1945 DOA: 12/31/2019  PCP: Susy Frizzle, MD  Patient coming from: Skilled nursing facility.  Chief Complaint: Fall.  History obtained from ER physician.  Patient has dementia.   HPI: Stacy Davis is a 74 y.o. female with history of advanced dementia, hypothyroidism, seizures, depression and hypothyroidism was brought to the ER after patient had a fall while walking and tripped on the walker.  Patient was complaining of right hip pain was brought to the ER.  ED Course: In the ER patient had CT head C-spine and CT abdomen and pelvis.  CT scan showed right-sided comminuted impacted nondisplaced fracture of the right greater trochanter and nondisplaced fracture of the right 10th and 11th rib.  There was also an age-indeterminate compression fracture of L3 and L4.  Lab work was significant for hemoglobin of 11 Covid test was negative.  ER physician discussed with on-call orthopedic surgeon Dr. Veverly Fells will be seeing patient in consult.  Patient admitted for further observation.  Review of Systems: As per HPI, rest all negative.   Past Medical History:  Diagnosis Date  . Anxiety   . Arthritis    ?OA vs Rheum (established with Dr. Jefm Bryant pending blood work)  . Articular cartilage disorder of shoulder 04/2012   right  . Back pain    h/o DDD since 2010  . Basal cell carcinoma    right dorsum nose, right forhead  . Dementia ([place])   . Dementia ([place])   . Dental crowns present    also dental caps  . Diabetes mellitus    NIDDM  . Fecal incontinence   . GERD (gastroesophageal reflux disease)   . Headache(784.0)    occasional  . History of skin cancer   . Hyperlipidemia    no current med.  . Hypertension   . Hyperthyroidism   . Hypothyroid   . Impingement syndrome of right shoulder 04/2012  . Memory impairment    dementia- worsenign 12/2016 with medication non compliance and delusions.    . Orthostatic  dizziness   . Right rotator cuff tear 05/15/2012  . Rotator cuff rupture 04/2012   right  . Seizures ([place])   . 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 Von Willebrand disease ([place])     Past Surgical History:  Procedure Laterality Date  . APPENDECTOMY    . BREAST SURGERY     hematoma evacuation due to trauma  . CARDIAC CATHETERIZATION  03/06/2001  . CESAREAN SECTION     x 2  . COLONOSCOPY  01/20/2006 per patient  . FRACTURE SURGERY    . KNEE ARTHROSCOPY Right 12/01/2014   Procedure: ARTHROSCOPY KNEE,partial medial menisectomy and plica excision;  Surgeon: Hessie Knows, MD;  Location: [facility];  Service: Orthopedics;  Laterality: Right;  . LUMBAR LAMINECTOMY/DECOMPRESSION MICRODISCECTOMY  03/01/2009   right L4-5; arthrodesis L4-5  . ORIF WRIST FRACTURE     left  . SHOULDER ARTHROSCOPY WITH ROTATOR CUFF REPAIR AND SUBACROMIAL DECOMPRESSION  05/15/2012   Procedure: SHOULDER ARTHROSCOPY WITH ROTATOR CUFF REPAIR AND SUBACROMIAL DECOMPRESSION;  Surgeon: Johnny Bridge, MD;  Location: [facility];  Service: Orthopedics;  Laterality: Right;  RIGHT ARTHROSCOPY SHOULDER DEBRIDEMENT LIMITED, ARTHROSCOPY SHOULDER DECOMPRESSION SUBACROMIAL PARTIAL ACROMIOPLASTY WITH CORACOACROMIAL RELEASE, ARTHROSCOPY SHOULDER WITH ROTATOR CUFF REPAIR  . TUBAL LIGATION       reports that she has never smoked. She has never used smokeless tobacco. She  reports that she does not drink alcohol and does not use drugs.  Allergies  Allergen Reactions  . Tramadol Other (See Comments)    CHANGES IN MEMORY    Family History  Problem Relation Age of Onset  . Dementia Mother   . Arthritis Mother   . Cancer Father        unknown primary  . Heart disease Father   . Thyroid disease Sister   . Cancer Sister        brain tumor  . Dementia Sister   . COPD Brother   . Cancer Brother        bone cancer  . Colon cancer Paternal Grandfather   . Breast cancer Neg Hx     Prior to  Admission medications   Medication Sig Start Date End Date Taking? Authorizing Provider  levETIRAcetam (KEPPRA XR) 500 MG 24 hr tablet Take 2 tablets (1,000 mg total) by mouth at bedtime AND 1 tablet (500 mg total) in the morning. 11/01/19  Yes Dorie Rank, MD  levothyroxine (SYNTHROID) 88 MCG tablet TAKE 1 TABLET BY MOUTH ONCE A DAY Patient taking differently: Take 88 mcg by mouth daily.  10/05/18  Yes Susy Frizzle, MD  LORazepam (ATIVAN) 0.5 MG tablet Take 0.5 mg by mouth 2 (two) times daily as needed for anxiety.   Yes [provider]  melatonin 3 MG TABS tablet Take 3 mg by mouth at bedtime.   Yes [provider]  omeprazole (PRILOSEC) 40 MG capsule TAKE 1 CAPSULE BY MOUTH ONCE DAILY Patient taking differently: Take 40 mg by mouth daily.  04/01/19  Yes Susy Frizzle, MD  QUEtiapine (SEROQUEL) 25 MG tablet Take 1 tablet (25 mg total) by mouth at bedtime. 05/05/19  Yes Marcial Pacas, MD  sertraline (ZOLOFT) 50 MG tablet Take 1 tablet (50 mg total) by mouth daily. 05/05/19  Yes Marcial Pacas, MD  traMADol (ULTRAM) 50 MG tablet Take 50 mg by mouth 4 (four) times daily as needed for pain. 12/29/19  Yes [provider]    Physical Exam: Constitutional: Moderately built and nourished. Vitals:   12/31/19 2031 12/31/19 2306 01/01/20 0115 01/01/20 0317  BP:  119/83 (!) 127/50 (!) 131/94  Pulse:  (!) 111 77 75  Resp:  20 16 (!) 22  Temp:      TempSrc:      SpO2:  100% (!) 89% 98%  Weight: 52.8 kg     Height: 5\' 3"  (1.6 m)      Eyes: Anicteric no pallor. ENMT: No discharge from the ears eyes nose or mouth. Neck: No mass felt.  No neck rigidity. Respiratory: No rhonchi or crepitations. Cardiovascular: S1-S2 heard. Abdomen: Soft nontender bowel sounds present. Musculoskeletal: Pain on moving right hip. Skin: No rash. Neurologic: Alert awake oriented to her name.  Moves all extremities. Psychiatric: Oriented to her name.   Labs on Admission: I have personally reviewed  following labs and imaging studies  CBC: Recent Labs  Lab 12/31/19 0001  WBC 8.2  HGB 11.0*  HCT 33.1*  MCV 85.8  PLT 341   Basic Metabolic Panel: Recent Labs  Lab 12/31/19 0001  NA 142  K 3.8  CL 105  CO2 26  GLUCOSE 86  BUN 28*  CREATININE 0.67  CALCIUM 9.2   GFR: Estimated Creatinine Clearance: 52.6 mL/min (by C-G formula based on SCr of 0.67 mg/dL). Liver Function Tests: No results for input(s): AST, ALT, ALKPHOS, BILITOT, PROT, ALBUMIN in the last 168 hours.  No results for input(s): LIPASE, AMYLASE in the last 168 hours. No results for input(s): AMMONIA in the last 168 hours. Coagulation Profile: Recent Labs  Lab 01/01/20 0202  INR 1.1   Cardiac Enzymes: No results for input(s): CKTOTAL, CKMB, CKMBINDEX, TROPONINI in the last 168 hours. BNP (last 3 results) No results for input(s): PROBNP in the last 8760 hours. HbA1C: No results for input(s): HGBA1C in the last 72 hours. CBG: No results for input(s): GLUCAP in the last 168 hours. Lipid Profile: No results for input(s): CHOL, HDL, LDLCALC, TRIG, CHOLHDL, LDLDIRECT in the last 72 hours. Thyroid Function Tests: No results for input(s): TSH, T4TOTAL, FREET4, T3FREE, THYROIDAB in the last 72 hours. Anemia Panel: No results for input(s): VITAMINB12, FOLATE, FERRITIN, TIBC, IRON, RETICCTPCT in the last 72 hours. Urine analysis:    Component Value Date/Time   COLORURINE STRAW (A) 01/01/2020 0202   APPEARANCEUR CLEAR 01/01/2020 0202   LABSPEC >1.046 (H) 01/01/2020 0202   PHURINE 7.0 01/01/2020 0202   GLUCOSEU NEGATIVE 01/01/2020 0202   HGBUR SMALL (A) 01/01/2020 0202   BILIRUBINUR NEGATIVE 01/01/2020 0202   BILIRUBINUR negative 06/10/2012 1631   KETONESUR NEGATIVE 01/01/2020 0202   PROTEINUR NEGATIVE 01/01/2020 0202   UROBILINOGEN negative 06/10/2012 1631   NITRITE NEGATIVE 01/01/2020 0202   LEUKOCYTESUR NEGATIVE 01/01/2020 0202   Sepsis Labs: @LABRCNTIP (procalcitonin:4,lacticidven:4) )No results  found for this or any previous visit (from the past 240 hour(s)).   Radiological Exams on Admission: CT Head Wo Contrast  Result Date: 12/31/2019 CLINICAL DATA:  Fall EXAM: CT HEAD WITHOUT CONTRAST TECHNIQUE: Contiguous axial images were obtained from the base of the skull through the vertex without intravenous contrast. COMPARISON:  December 24, 2019 FINDINGS: Brain: No evidence of acute territorial infarction, hemorrhage, hydrocephalus,extra-axial collection or mass lesion/mass effect. There is dilatation the ventricles and sulci consistent with age-related atrophy. Low-attenuation changes in the deep white matter consistent with small vessel ischemia. Vascular: No hyperdense vessel or unexpected calcification. Skull: The skull is intact. No fracture or focal lesion identified. Sinuses/Orbits: The visualized paranasal sinuses and mastoid air cells are clear. The orbits and globes intact. Other: None Cervical spine: Somewhat limited due to patient motion. Alignment: There is a minimal anterolisthesis of C3 on C4 measuring 2 mm. Skull base and vertebrae: Visualized skull base is intact. No atlanto-occipital dissociation. The vertebral body heights are well maintained. No fracture or pathologic osseous lesion seen. Soft tissues and spinal canal: The visualized paraspinal soft tissues are unremarkable. No prevertebral soft tissue swelling is seen. The spinal canal is grossly unremarkable, no large epidural collection or significant canal narrowing. Disc levels: Multilevel cervical spine spondylosis is seen with anterior osteophytes disc osteophyte complex and uncovertebral osteophytes most notable at C5-C6 with severe neural foraminal narrowing and mild to moderate central canal stenosis. Upper chest: The lung apices are clear. Thoracic inlet is within normal limits. Other: None IMPRESSION: No acute intracranial abnormality. Findings consistent with age related atrophy and chronic small vessel ischemia No acute  fracture or malalignment of the spine. Cervical spine spondylosis most notable at C5-C6. Electronically Signed   By: Prudencio Pair M.D.   On: 12/31/2019 21:22   CT Cervical Spine Wo Contrast  Result Date: 12/31/2019 CLINICAL DATA:  Fall EXAM: CT HEAD WITHOUT CONTRAST TECHNIQUE: Contiguous axial images were obtained from the base of the skull through the vertex without intravenous contrast. COMPARISON:  December 24, 2019 FINDINGS: Brain: No evidence of acute territorial infarction, hemorrhage, hydrocephalus,extra-axial collection or mass lesion/mass effect. There is dilatation  the ventricles and sulci consistent with age-related atrophy. Low-attenuation changes in the deep white matter consistent with small vessel ischemia. Vascular: No hyperdense vessel or unexpected calcification. Skull: The skull is intact. No fracture or focal lesion identified. Sinuses/Orbits: The visualized paranasal sinuses and mastoid air cells are clear. The orbits and globes intact. Other: None Cervical spine: Somewhat limited due to patient motion. Alignment: There is a minimal anterolisthesis of C3 on C4 measuring 2 mm. Skull base and vertebrae: Visualized skull base is intact. No atlanto-occipital dissociation. The vertebral body heights are well maintained. No fracture or pathologic osseous lesion seen. Soft tissues and spinal canal: The visualized paraspinal soft tissues are unremarkable. No prevertebral soft tissue swelling is seen. The spinal canal is grossly unremarkable, no large epidural collection or significant canal narrowing. Disc levels: Multilevel cervical spine spondylosis is seen with anterior osteophytes disc osteophyte complex and uncovertebral osteophytes most notable at C5-C6 with severe neural foraminal narrowing and mild to moderate central canal stenosis. Upper chest: The lung apices are clear. Thoracic inlet is within normal limits. Other: None IMPRESSION: No acute intracranial abnormality. Findings consistent with  age related atrophy and chronic small vessel ischemia No acute fracture or malalignment of the spine. Cervical spine spondylosis most notable at C5-C6. Electronically Signed   By: Prudencio Pair M.D.   On: 12/31/2019 21:22   CT ABDOMEN PELVIS W CONTRAST  Result Date: 01/01/2020 CLINICAL DATA:  Fall and tripped, right hip pain EXAM: CT ABDOMEN AND PELVIS WITH CONTRAST TECHNIQUE: Multidetector CT imaging of the abdomen and pelvis was performed using the standard protocol following bolus administration of intravenous contrast. CONTRAST:  144mL OMNIPAQUE IOHEXOL 300 MG/ML  SOLN COMPARISON:  Radiograph same day FINDINGS: Hepatobiliary: Homogeneous hepatic attenuation without traumatic injury. No focal lesion. Gallbladder physiologically distended, no calcified stone. No biliary dilatation. Pancreas: No evidence for traumatic injury. Fatty atrophy of the pancreas is noted. No ductal dilatation or inflammation. Spleen: Homogeneous attenuation without traumatic injury. Normal in size. Adrenals/Urinary Tract: No adrenal hemorrhage. Kidneys demonstrate symmetric enhancement and excretion on delayed phase imaging. No evidence or renal injury. Ureters are well opacified proximal through mid portion. Bladder is physiologically distended without wall thickening. Stomach/Bowel: Suboptimally assessed without enteric contrast, allowing for this, no evidence of bowel injury. Stomach physiologically distended. There are no dilated or thickened small or large bowel loops. Moderate stool burden. No evidence of mesenteric hematoma. No free air free fluid. Vascular/Lymphatic: No acute vascular injury. The abdominal aorta and IVC are intact. Scattered aortic atherosclerotic calcifications are seen without aneurysmal dilatation. No evidence of retroperitoneal, abdominal, or pelvic adenopathy. Reproductive: No acute abnormality. Other: No focal contusion or abnormality of the abdominal wall. Musculoskeletal: There is a comminuted slightly  impacted fracture involving the right femoral greater tuberosity. Overlying soft tissue swelling seen along the right lateral hip. There is also nondisplaced fractures of the posterior right tenth and eleventh ribs. There appears to be age indeterminate, slight compression deformities of the L3 and L4 vertebral bodies with less than 25% loss in height. A x stop fixation is noted at L4-L5. IMPRESSION: Comminuted impacted nondisplaced fracture of the right greater trochanter. Nondisplaced posterior right tenth and eleventh rib fractures. Age indeterminate slight superior compression deformities of the L3 and L4 vertebral bodies with less than 25% loss height. No acute intra-abdominal or pelvic injury. Aortic Atherosclerosis (ICD10-I70.0). Electronically Signed   By: Prudencio Pair M.D.   On: 01/01/2020 01:29   CT L-SPINE NO CHARGE  Result Date: 01/01/2020 CLINICAL DATA:  Fall and pain EXAM: CT LUMBAR SPINE WITHOUT CONTRAST TECHNIQUE: Multidetector CT imaging of the lumbar spine was performed without intravenous contrast administration. Multiplanar CT image reconstructions were also generated. COMPARISON:  None. FINDINGS: Segmentation: There are 5 non-rib bearing lumbar type vertebral bodies with the last intervertebral disc space labeled as L5-S1. 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 A grade 1 anterolisthesis of L4 on L5 is seen measuring 4 mm. Vertebrae: There is age indeterminate slight superior compression deformity of the L3 and L4 vertebral bodies with less than 25% loss in height. There is a posterior X stop fixation at L4-L5. Paraspinal and other soft tissues: The paraspinal soft tissues and visualized retroperitoneal structures are unremarkable. The sacroiliac joints are intact. Scattered aortic atherosclerosis is. Disc levels: Mild disc height loss with facet arthropathy seen throughout the lumbar spine. No significant canal or neural foraminal stenosis however is noted  IMPRESSION: Age indeterminate slight superior compression deformities of the L3 and L4 vertebral bodies with less than 25% loss in height. Electronically Signed   By: Prudencio Pair M.D.   On: 01/01/2020 01:35   DG Chest Portable 1 View  Result Date: 12/31/2019 CLINICAL DATA:  Fall EXAM: PORTABLE CHEST 1 VIEW COMPARISON:  06/14/2018 FINDINGS: The heart size and mediastinal contours are within normal limits. Both lungs are clear. The visualized skeletal structures are unremarkable. IMPRESSION: No active disease. Electronically Signed   By: Donavan Foil M.D.   On: 12/31/2019 22:43   DG Hip Unilat W or Wo Pelvis 2-3 Views Right  Result Date: 12/31/2019 CLINICAL DATA:  Trip and fall injury with right-sided hip pain. EXAM: DG HIP (WITH OR WITHOUT PELVIS) 2-3V RIGHT COMPARISON:  06/14/2018 mild degenerative changes in the right hip. No change in appearance since prior study. No evidence of acute fracture or dislocation. No focal bone lesion or bone destruction. Pelvis and sacrum appear intact. Degenerative changes in the lower lumbar spine. FINDINGS: Mild degenerative changes in the right hip. No change in appearance since prior study. No evidence of acute fracture or dislocation. No focal bone lesion or bone destruction. Pelvis and sacrum appear intact. Degenerative changes in the lower lumbar spine. IMPRESSION: No acute bony abnormalities. Electronically Signed   By: Lucienne Capers M.D.   On: 12/31/2019 21:07    EKG: Independently reviewed.  Normal sinus rhythm.  Assessment/Plan Active Problems:   HTN (hypertension)   Memory loss   Hypothyroid   Von Willebrand disease ([place])   Dementia ([place])   Trochanteric fracture ([facility])   Closed rib fracture   Anemia    1. Right-sided commuted impacted nondisplaced fracture of the right greater trochanter and also fractures of the right 10th and 11th rib and age-indeterminate fracture of the lumbar L3-L4 spine after fall for which Dr. Veverly Fells orthopedic surgeon  has been consulted.  We will keep patient n.p.o. for now.  Further recommendation per orthopedic surgeon.  Pain relief medications. 2. Hypothyroidism on Synthroid. 3. History of seizures on Keppra which have dosed as IV for now. 4. Anemia appears to be chronic follow CBC. 5. History of dementia and depression on Seroquel and Zoloft. 6. History of von Willebrand disease.   DVT prophylaxis: SCDs for now. Code Status: Full code. Family Communication: We will need to talk to the family in the morning. Disposition Plan: Back to facility when stable. Consults called: Orthopedics. Admission status: Observation.   Rise Patience MD [facility] Pager [phone].  If 7PM-7AM, please contact night-coverage [email or web address]  Password [email or web address]  01/01/2020, 4:17 AM

## 2020-01-01 NOTE — Progress Notes (Signed)
I was asked to comment on this patient's history of presumed von Willebrand's disease. Reviewing laboratory data in the last 11 years I see no clear-cut evidence to suggest she has this condition. Her von Willebrand's disease panel obtained in September 2010 was normal. She has had multiple surgical procedures since that time without any postoperative complications. Her coagulation parameters on 01/01/2020 showed normal PT and PTT with a normal platelet count.  She presented with fracture of her right trochanter and currently under evaluation by orthopedic surgery for possible surgical intervention.  I see no indication or need for any preoperative coagulation factors such as DDAVP or recombinant factor. She should have no increased risk of postoperative bleeding.

## 2020-01-02 DIAGNOSIS — R413 Other amnesia: Secondary | ICD-10-CM

## 2020-01-02 DIAGNOSIS — I10 Essential (primary) hypertension: Secondary | ICD-10-CM

## 2020-01-02 DIAGNOSIS — Z515 Encounter for palliative care: Secondary | ICD-10-CM

## 2020-01-02 DIAGNOSIS — R531 Weakness: Secondary | ICD-10-CM

## 2020-01-02 LAB — CBC
HCT: 34.2 % — ABNORMAL LOW (ref 36.0–46.0)
Hemoglobin: 10.9 g/dL — ABNORMAL LOW (ref 12.0–15.0)
MCH: 27.6 pg (ref 26.0–34.0)
MCHC: 31.9 g/dL (ref 30.0–36.0)
MCV: 86.6 fL (ref 80.0–100.0)
Platelets: 184 10*3/uL (ref 150–400)
RBC: 3.95 MIL/uL (ref 3.87–5.11)
RDW: 13.6 % (ref 11.5–15.5)
WBC: 5.4 10*3/uL (ref 4.0–10.5)
nRBC: 0 % (ref 0.0–0.2)

## 2020-01-02 LAB — GLUCOSE, CAPILLARY
Glucose-Capillary: 118 mg/dL — ABNORMAL HIGH (ref 70–99)
Glucose-Capillary: 144 mg/dL — ABNORMAL HIGH (ref 70–99)
Glucose-Capillary: 90 mg/dL (ref 70–99)
Glucose-Capillary: 90 mg/dL (ref 70–99)
Glucose-Capillary: 93 mg/dL (ref 70–99)
Glucose-Capillary: 94 mg/dL (ref 70–99)

## 2020-01-02 LAB — MRSA PCR SCREENING: MRSA by PCR: NEGATIVE

## 2020-01-02 MED ORDER — LEVETIRACETAM ER 500 MG PO TB24
1000.0000 mg | ORAL_TABLET | Freq: Every day | ORAL | Status: DC
  Administered 2020-01-02 – 2020-01-05 (×4): 1000 mg via ORAL
  Filled 2020-01-02 (×4): qty 2

## 2020-01-02 MED ORDER — KETOROLAC TROMETHAMINE 10 MG PO TABS
10.0000 mg | ORAL_TABLET | Freq: Four times a day (QID) | ORAL | Status: DC | PRN
  Administered 2020-01-04: 10 mg via ORAL
  Filled 2020-01-02 (×2): qty 1

## 2020-01-02 MED ORDER — IBUPROFEN 400 MG PO TABS: 600.0000 mg | ORAL_TABLET | Freq: Four times a day (QID) | ORAL | Status: DC | PRN

## 2020-01-02 MED ORDER — LEVETIRACETAM ER 500 MG PO TB24
500.0000 mg | ORAL_TABLET | Freq: Every day | ORAL | Status: DC
  Administered 2020-01-02 – 2020-01-05 (×4): 500 mg via ORAL
  Filled 2020-01-02 (×4): qty 1

## 2020-01-02 MED ORDER — ACETAMINOPHEN 500 MG PO TABS
1000.0000 mg | ORAL_TABLET | Freq: Two times a day (BID) | ORAL | Status: DC | PRN
  Administered 2020-01-02 – 2020-01-05 (×6): 1000 mg via ORAL
  Filled 2020-01-02 (×6): qty 2

## 2020-01-02 MED ORDER — LIDOCAINE 5 % EX PTCH
1.0000 | MEDICATED_PATCH | CUTANEOUS | Status: DC
  Administered 2020-01-02 – 2020-01-05 (×4): 1 via TRANSDERMAL
  Filled 2020-01-02 (×4): qty 1

## 2020-01-02 MED ORDER — TRAMADOL HCL 50 MG PO TABS: 100.0000 mg | ORAL_TABLET | Freq: Four times a day (QID) | ORAL | Status: DC

## 2020-01-02 NOTE — Progress Notes (Signed)
PHARMACIST - PHYSICIAN COMMUNICATION DR:   Roger Shelter  CONCERNING: IV to Oral Route Change Policy  RECOMMENDATION: This patient is receiving Keppra by the intravenous route.  Based on criteria approved by the Pharmacy and Therapeutics Committee, the intravenous medication(s) is/are being converted to the equivalent oral dose form(s).   DESCRIPTION: These criteria include:  The patient is eating (either orally or via tube) and/or has been taking other orally administered medications for a least 24 hours  The patient has no evidence of active gastrointestinal bleeding or impaired GI absorption (gastrectomy, short bowel, patient on TNA or NPO).  If you have questions about this conversion, please contact the Pharmacy Department  [phone] )  [place] PharmD, BCPS Clinical Pharmacist [facility] main pharmacy [phone] 01/02/2020 7:09 AM

## 2020-01-02 NOTE — Evaluation (Signed)
Physical Therapy Evaluation Patient Details Name: Stacy Davis MRN: [id number] DOB: [birth year] Today's Date: 01/02/2020   History of Present Illness  Pt s/p fall with rib 10 and 11 fx and R greater trochanter fx - non-operative at this time.  Pt with hx of recent fall with R wrist fx - above elbow splint still in place.  Pt with hx of sz, dementia, DM, lumbar laminectomy, and L3,4 compression fx.  Clinical Impression  Pt admitted as above and presenting with functional mobility limitations 2* dementia related cognitive deficits, R hip pain, splinted R UE, 25% PWB on R LE and balance deficits.  This date pt to EOB sitting total assist and total assist with rolling to change soiled linens. Pt would benefit from follow up rehab at SNF level to maximize safety.    Follow Up Recommendations SNF    Equipment Recommendations  None recommended by PT    Recommendations for Other Services       Precautions / Restrictions Precautions Precautions: Fall Restrictions Weight Bearing Restrictions: Yes Other Position/Activity Restrictions: 25% PWB R LE, R UE in above Elbow splint      Mobility  Bed Mobility Overal bed mobility: Needs Assistance Bed Mobility: Supine to Sit;Sit to Supine Rolling: Total assist;+2 for physical assistance;+2 for safety/equipment   Supine to sit: Total assist Sit to supine: Total assist   General bed mobility comments: Pt following no cues to move supine<>sit but able to balance in bedside sitting with CGA only  Transfers                 General transfer comment: NT 2* Pt inability to follow cues and 25% PWB status  Ambulation/Gait                Stairs            Wheelchair Mobility    Modified Rankin (Stroke Patients Only)       Balance Overall balance assessment: Needs assistance Sitting-balance support: Bilateral upper extremity supported;Feet supported Sitting balance-Leahy Scale: Fair                                        Pertinent Vitals/Pain      Home Living Family/patient expects to be discharged to:: Skilled nursing facility                      Prior Function Level of Independence: Needs assistance         Comments: Pt unable to provide hx; from memory unit     Hand Dominance   Dominant Hand: Right    Extremity/Trunk Assessment   Upper Extremity Assessment Upper Extremity Assessment: Generalized weakness    Lower Extremity Assessment Lower Extremity Assessment: Generalized weakness;RLE deficits/detail RLE Deficits / Details: c/o pain with attempts to move R LE       Communication   Communication: No difficulties  Cognition Arousal/Alertness: Lethargic Behavior During Therapy: Flat affect Overall Cognitive Status: History of cognitive impairments - at baseline                                        General Comments      Exercises     Assessment/Plan    PT Assessment Patient needs continued PT services  PT Problem List Decreased  strength;Decreased activity tolerance;Decreased balance;Decreased mobility;Decreased cognition;Decreased knowledge of use of DME;Pain       PT Treatment Interventions DME instruction;Gait training;Functional mobility training;Therapeutic activities;Therapeutic exercise;Balance training;Cognitive remediation;Patient/family education    PT Goals (Current goals can be found in the Care Plan section)  Acute Rehab PT Goals Patient Stated Goal: No goals stated PT Goal Formulation: With patient Time For Goal Achievement: 01/16/20 Potential to Achieve Goals: Poor    Frequency Min 2X/week   Barriers to discharge        Co-evaluation               AM-PAC PT "6 Clicks" Mobility  Outcome Measure Help needed turning from your back to your side while in a flat bed without using bedrails?: Total Help needed moving from lying on your back to sitting on the side of a flat bed without using bedrails?:  Total Help needed moving to and from a bed to a chair (including a wheelchair)?: Total Help needed standing up from a chair using your arms (e.g., wheelchair or bedside chair)?: Total Help needed to walk in hospital room?: Total Help needed climbing 3-5 steps with a railing? : Total 6 Click Score: 6    End of Session Equipment Utilized During Treatment: Gait belt Activity Tolerance: Patient limited by fatigue;Treatment limited secondary to agitation Patient left: in bed;with call bell/phone within reach;with bed alarm set   PT Visit Diagnosis: Difficulty in walking, not elsewhere classified (R26.2);Pain Pain - Right/Left: Right Pain - part of body: Hip    Time: 1610-9604 PT Time Calculation (min) (ACUTE ONLY): 26 min   Charges:   PT Evaluation $PT Eval Moderate Complexity: 1 Mod PT Treatments $Therapeutic Activity: 8-22 mins        Mauro Kaufmann PT Acute Rehabilitation Services Pager [phone] Office [phone]   Carston Riedl 01/02/2020, 10:13 AM

## 2020-01-02 NOTE — Plan of Care (Signed)
  Problem: Education: Goal: Verbalization of understanding the information provided (i.e., activity precautions, restrictions, etc) will improve Outcome: Progressing Goal: Individualized Educational Video(s) Outcome: Progressing   Problem: Activity: Goal: Ability to ambulate and perform ADLs will improve Outcome: Progressing   Problem: Self-Concept: Goal: Ability to maintain and perform role responsibilities to the fullest extent possible will improve Outcome: Progressing   Problem: Pain Management: Goal: Pain level will decrease Outcome: Progressing   Problem: Education: Goal: Knowledge of General Education information will improve Description: Including pain rating scale, medication(s)/side effects and non-pharmacologic comfort measures Outcome: Progressing   Problem: Health Behavior/Discharge Planning: Goal: Ability to manage health-related needs will improve Outcome: Progressing   Problem: Clinical Measurements: Goal: Ability to maintain clinical measurements within normal limits will improve Outcome: Progressing Goal: Will remain free from infection Outcome: Progressing Goal: Diagnostic test results will improve Outcome: Progressing Goal: Respiratory complications will improve Outcome: Progressing Goal: Cardiovascular complication will be avoided Outcome: Progressing   Problem: Activity: Goal: Risk for activity intolerance will decrease Outcome: Progressing   Problem: Nutrition: Goal: Adequate nutrition will be maintained Outcome: Progressing   Problem: Coping: Goal: Level of anxiety will decrease Outcome: Progressing   Problem: Elimination: Goal: Will not experience complications related to bowel motility Outcome: Progressing Goal: Will not experience complications related to urinary retention Outcome: Progressing   Problem: Pain Managment: Goal: General experience of comfort will improve Outcome: Progressing   Problem: Safety: Goal: Ability to  remain free from injury will improve Outcome: Progressing   Problem: Skin Integrity: Goal: Risk for impaired skin integrity will decrease Outcome: Progressing

## 2020-01-02 NOTE — Progress Notes (Signed)
PROGRESS NOTE    Patient: Stacy Davis                            PCP: Susy Frizzle, MD                    DOB: 05/21/1946            DOA: 12/31/2019 ZOX:[id number]             DOS: 01/02/2020, 9:32 AM   LOS: 1 day   Date of Service: The patient was seen and examined on 01/02/2020  Subjective:   The patient was seen and examined this morning, comfortably sleeping, easily arousable pleasantly confused Per nursing no issues overnight Per nursing due to pain medication patient remains excessively drowsy...   Brief Narrative:    Stacy Davis is a 74 y.o. female with history of advanced dementia, hypothyroidism, seizures, depression and hypothyroidism was brought to the ER after patient had a fall while walking and tripped on the walker.  Patient was complaining of right hip pain was brought to the ER.  ED: Patient had CT head C-spine and CT abdomen and pelvis.   CT scan showed right-sided comminuted impacted nondisplaced fracture of the right greater trochanter and nondisplaced fracture of the right 10th and 11th rib.   There was also an age-indeterminate compression fracture of L3 and L4.  Lab work was significant for hemoglobin of 11/ Covid test was negative.    Orthopedic surgeon Dr. Veverly Fells consulted.     Assessment & Plan:   Active Problems:   HTN (hypertension)   Memory loss   Hypothyroid   Von Willebrand disease ([place])   Dementia ([place])   Trochanteric fracture ([place])   Closed rib fracture   Anemia   Hip fx ([facility])  Right hip fracture: -Neurologically stable -Continue pain management -Status post Ortho evaluation-recommended no surgical intervention, Recommending mobilization minimal weightbearing on the right hip 25%, Outpatient follow-up -Patient has been n.p.o., and was able diet as tolerated  Right-sided commuted impacted nondisplaced fracture of the right greater trochanter and also fractures of the right 10th and 11th rib and age-indeterminate fracture  of the lumbar L3-L4 spine after fall for which Dr. Veverly Fells orthopedic surgeon has been consulted.    -We will continue pain management, due to excessive sedation, morphine will be DC'd, oral analgesic will be initiated -Pending PT evaluation -Likely will discharge back to SNF in a.m.   Hypothyroidism on Synthroid.  History of seizures on Keppra which have dosed as IV for now.  Anemia appears to be chronic follow CBC.  Advance Dementia and depression on Seroquel and Zoloft.  History of von Willebrand disease ... -Heme-onc Dr. Alen Blew consulted -after thorough investigation recommended no hold back as far as surgical intervention, no need for treatment, as of 01/01/2020 PT, PTT normal, normal platelets,  Per Dr. Tami Lin: "history of presumed von Willebrand's disease. Reviewing laboratory data in the last 11 years I see no clear-cut evidence to suggest she has this condition. Her von Willebrand's disease panel obtained in September 2010 was normal. She has had multiple surgical procedures since that time without any postoperative complications."   ---------------------------------------------------------------------------------------------------------------------------- DVT prophylaxis:  SCD/Compression stockings Code Status:   Code Status: Full Code Family Communication: No family member present at bedside- attempt Was made to call his son POA Mr. Lizabeth Fellner at [phone] message was left  -Advance care planning has been discussed.  Admission status:    Status is: Observation  The patient remains OBS appropriate and will d/c before 2 midnights.  Dispo: The patient is from: SNF              Anticipated d/c is to: SNF              Anticipated d/c date is: In a.m., SARS-CoV-2 test ordered              Patient currently is not medically stable to d/c.        Procedures:       Antimicrobials:  Anti-infectives (From admission, onward)   None       Medication:   . levETIRAcetam  500 mg Oral Daily   And  . levETIRAcetam  1,000 mg Oral QHS  . levothyroxine  88 mcg Oral Daily  . lidocaine  1 patch Transdermal Q24H  . melatonin  3 mg Oral QHS  . pantoprazole  40 mg Oral Daily  . QUEtiapine  25 mg Oral QHS  . sertraline  50 mg Oral Daily  . traMADol  100 mg Oral Q6H    acetaminophen, LORazepam, ondansetron **OR** ondansetron (ZOFRAN) IV   Objective:   Vitals:   01/01/20 1436 01/01/20 1936 01/02/20 0013 01/02/20 0424  BP: (!) 142/102 114/86 101/64 118/66  Pulse: 63 97 (!) 58 (!) 56  Resp: 20 16 16 14   Temp: 98 F (36.7 C) 98 F (36.7 C) (!) 97.5 F (36.4 C) (!) 97.4 F (36.3 C)  TempSrc: Oral Oral Oral Oral  SpO2: 99% 97% 98% 97%  Weight:      Height:        Intake/Output Summary (Last 24 hours) at 01/02/2020 0932 Last data filed at 01/02/2020 2725 Gross per 24 hour  Intake 200 ml  Output 600 ml  Net -400 ml   Filed Weights   12/31/19 2031  Weight: 52.8 kg     Examination:   Physical Exam  No significant changes Constitution: Excessively sleepy, pleasantly demented HEENT: Normocephalic, PERRL, otherwise with in Normal limits  Chest:Chest symmetric Cardio vascular:  S1/S2, RRR, No murmure, No Rubs or Gallops  pulmonary: Clear to auscultation bilaterally, respirations unlabored, negative wheezes / crackles Abdomen: Soft, non-tender, non-distended, bowel sounds,no masses, no organomegaly Muscular skeletal:  Negative for contractions Limited exam -laying in bed, arm in a cast, limited range of motions of lower extremity Neuro: Limited exam -demented -CNII-XII intact. , normal sensation, reflexes intact  Extremities: No pitting edema lower extremities, +2 pulses  Skin: Dry, warm to touch, negative for any Rashes, No open wounds Wounds: per nursing documentation    ----------------------------------------------------------------------------------------------------------------------------    LABs:  CBC Latest Ref Rng &  Units 01/02/2020 01/01/2020 12/31/2019  WBC 4.0 - 10.5 K/uL 5.4 5.9 8.2  Hemoglobin 12.0 - 15.0 g/dL 10.9(L) 10.6(L) 11.0(L)  Hematocrit 36 - 46 % 34.2(L) 32.3(L) 33.1(L)  Platelets 150 - 400 K/uL 184 185 213   CMP Latest Ref Rng & Units 01/01/2020 12/31/2019 12/10/2019  Glucose 70 - 99 mg/dL 88 86 105(H)  BUN 8 - 23 mg/dL 22 28(H) 27(H)  Creatinine 0.44 - 1.00 mg/dL 0.59 0.67 0.71  Sodium 135 - 145 mmol/L 137 142 142  Potassium 3.5 - 5.1 mmol/L 3.5 3.8 4.1  Chloride 98 - 111 mmol/L 103 105 106  CO2 22 - 32 mmol/L 25 26 27   Calcium 8.9 - 10.3 mg/dL 8.9 9.2 9.3  Total Protein 6.5 - 8.1 g/dL - - 7.0  Total  Bilirubin 0.3 - 1.2 mg/dL - - 0.7  Alkaline Phos 38 - 126 U/L - - 106  AST 15 - 41 U/L - - 39  ALT 0 - 44 U/L - - 31       Micro Results Recent Results (from the past 240 hour(s))  SARS Coronavirus 2 by RT PCR (hospital order, performed in [facility] hospital lab) Nasopharyngeal Nasopharyngeal Swab     Status: None   Collection Time: 01/01/20  3:22 AM   Specimen: Nasopharyngeal Swab  Result Value Ref Range Status   SARS Coronavirus 2 NEGATIVE NEGATIVE Final    Comment: (NOTE) SARS-CoV-2 target nucleic acids are NOT DETECTED.  The SARS-CoV-2 RNA is generally detectable in upper and lower respiratory specimens during the acute phase of infection. The lowest concentration of SARS-CoV-2 viral copies this assay can detect is 250 copies / mL. A negative result does not preclude SARS-CoV-2 infection and should not be used as the sole basis for treatment or other patient management decisions.  A negative result may occur with improper specimen collection / handling, submission of specimen other than nasopharyngeal swab, presence of viral mutation(s) within the areas targeted by this assay, and inadequate number of viral copies (<250 copies / mL). A negative result must be combined with clinical observations, patient history, and epidemiological information.  Fact Sheet for Patients:     StrictlyIdeas.no  Fact Sheet for Healthcare Providers: BankingDealers.co.za  This test is not yet approved or  cleared by the [place] FDA and has been authorized for detection and/or diagnosis of SARS-CoV-2 by FDA under an Emergency Use Authorization (EUA).  This EUA will remain in effect (meaning this test can be used) for the duration of the COVID-19 declaration under Section 564(b)(1) of the Act, 21 U.S.C. section 360bbb-3(b)(1), unless the authorization is terminated or revoked sooner.  Performed at [facility], [place] [address] [place], [place] [address]   MRSA PCR Screening     Status: None   Collection Time: 01/02/20  2:21 AM   Specimen: Nasal Mucosa; Nasopharyngeal  Result Value Ref Range Status   MRSA by PCR NEGATIVE NEGATIVE Final    Comment:        The GeneXpert MRSA Assay (FDA approved for NASAL specimens only), is one component of a comprehensive MRSA colonization surveillance program. It is not intended to diagnose MRSA infection nor to guide or monitor treatment for MRSA infections. Performed at [facility], [place] [address], [place], [place] [address]     Radiology Reports DG Wrist Complete Right  Result Date: 12/24/2019 CLINICAL DATA:  Fall, pain EXAM: RIGHT WRIST - COMPLETE 3+ VIEW COMPARISON:  November 01, 2019 FINDINGS: There is a transverse acute fracture of the distal right radius with dorsal angulation. No substantial displacement identified. No definite radiocarpal extension. IMPRESSION: Acute transverse fracture of the distal right radius with dorsal angulation. Electronically Signed   By: Macy Mis M.D.   On: 12/24/2019 13:23   CT Head Wo Contrast  Result Date: 12/31/2019 CLINICAL DATA:  Fall EXAM: CT HEAD WITHOUT CONTRAST TECHNIQUE: Contiguous axial images were obtained from the base of the skull through the vertex without intravenous contrast. COMPARISON:   December 24, 2019 FINDINGS: Brain: No evidence of acute territorial infarction, hemorrhage, hydrocephalus,extra-axial collection or mass lesion/mass effect. There is dilatation the ventricles and sulci consistent with age-related atrophy. Low-attenuation changes in the deep white matter consistent with small vessel ischemia. Vascular: No hyperdense vessel or unexpected calcification. Skull: The skull  is intact. No fracture or focal lesion identified. Sinuses/Orbits: The visualized paranasal sinuses and mastoid air cells are clear. The orbits and globes intact. Other: None Cervical spine: Somewhat limited due to patient motion. Alignment: There is a minimal anterolisthesis of C3 on C4 measuring 2 mm. Skull base and vertebrae: Visualized skull base is intact. No atlanto-occipital dissociation. The vertebral body heights are well maintained. No fracture or pathologic osseous lesion seen. Soft tissues and spinal canal: The visualized paraspinal soft tissues are unremarkable. No prevertebral soft tissue swelling is seen. The spinal canal is grossly unremarkable, no large epidural collection or significant canal narrowing. Disc levels: Multilevel cervical spine spondylosis is seen with anterior osteophytes disc osteophyte complex and uncovertebral osteophytes most notable at C5-C6 with severe neural foraminal narrowing and mild to moderate central canal stenosis. Upper chest: The lung apices are clear. Thoracic inlet is within normal limits. Other: None IMPRESSION: No acute intracranial abnormality. Findings consistent with age related atrophy and chronic small vessel ischemia No acute fracture or malalignment of the spine. Cervical spine spondylosis most notable at C5-C6. Electronically Signed   By: Prudencio Pair M.D.   On: 12/31/2019 21:22   CT Head Wo Contrast  Result Date: 12/24/2019 CLINICAL DATA:  Head trauma, minor. Unwitnessed fall, neck pain, dementia. EXAM: CT HEAD WITHOUT CONTRAST CT CERVICAL SPINE WITHOUT  CONTRAST TECHNIQUE: Multidetector CT imaging of the head and cervical spine was performed following the standard protocol without intravenous contrast. Multiplanar CT image reconstructions of the cervical spine were also generated. COMPARISON:  Head CT 12/10/2019, CT cervical spine 12/10/2019. FINDINGS: CT HEAD FINDINGS Brain: Stable mild generalized parenchymal atrophy and chronic small vessel ischemic disease. There is no acute intracranial hemorrhage. No demarcated cortical infarct. No extra-axial fluid collection. No evidence of intracranial mass. No midline shift. Vascular: No hyperdense vessel. Skull: Normal. Negative for fracture or focal lesion. Sinuses/Orbits: Visualized orbits show no acute finding. Mild ethmoid sinus mucosal thickening. No significant mastoid effusion. CT CERVICAL SPINE FINDINGS Alignment: Straightening of the expected cervical lordosis. Unchanged 2 mm C3-C4 grade 1 anterolisthesis. Skull base and vertebrae: The basion-dental and atlanto-dental intervals are maintained.No evidence of acute fracture to the cervical spine. Soft tissues and spinal canal: No prevertebral fluid or swelling. No visible canal hematoma. Disc levels: Cervical spondylosis with multilevel disc space narrowing, posterior disc osteophytes, uncovertebral and facet hypertrophy. Disc space narrowing is severe at the C4-C5 through C7-T1 levels. Degenerative fusion across the C4-C5 and C5-C6 disc spaces. Additionally, there is degenerative fusion across the facet joints bilaterally at C4-C5. A posterior disc osteophyte complex contributes to mild/moderate bony spinal canal stenosis at C5-C6. Multilevel bony neural foraminal narrowing. Upper chest: No consolidation within the imaged lung apices. No visible pneumothorax. IMPRESSION: CT head: 1. No evidence of acute intracranial abnormality. 2. Stable mild generalized parenchymal atrophy and chronic small vessel ischemic disease. 3. Mild ethmoid sinus mucosal thickening. CT  cervical spine: 1. No evidence of acute fracture to the cervical spine. 2. Unchanged 2 mm C3-C4 grade 1 anterolisthesis. 3. Advanced cervical spondylosis as outlined. Electronically Signed   By: Kellie Simmering DO   On: 12/24/2019 13:11   CT Head Wo Contrast  Result Date: 12/10/2019 CLINICAL DATA:  Posttraumatic headache after witnessed fall. EXAM: CT HEAD WITHOUT CONTRAST CT CERVICAL SPINE WITHOUT CONTRAST TECHNIQUE: Multidetector CT imaging of the head and cervical spine was performed following the standard protocol without intravenous contrast. Multiplanar CT image reconstructions of the cervical spine were also generated. COMPARISON:  December 06, 2019. FINDINGS:  CT HEAD FINDINGS Brain: Mild diffuse cortical atrophy is noted. Mild chronic ischemic white matter disease is noted. No mass effect or midline shift is noted. Ventricular size is within normal limits. There is no evidence of mass lesion, hemorrhage or acute infarction. Vascular: No hyperdense vessel or unexpected calcification. Skull: Normal. Negative for fracture or focal lesion. Sinuses/Orbits: No acute finding. Other: None. CT CERVICAL SPINE FINDINGS Alignment: Mild grade 1 anterolisthesis of C3-4 is noted secondary to posterior facet joint hypertrophy. Skull base and vertebrae: No acute fracture. No primary bone lesion or focal pathologic process. Soft tissues and spinal canal: No prevertebral fluid or swelling. No visible canal hematoma. Disc levels: Severe degenerative disc disease is noted at C4-5, C5-6, C6-7 and C7-T1. Upper chest: Negative. Other: Degenerative changes are seen involving posterior facet joints bilaterally. IMPRESSION: 1. Mild diffuse cortical atrophy. Mild chronic ischemic white matter disease. No acute intracranial abnormality seen. 2. Severe multilevel degenerative disc disease. No acute abnormality seen in the cervical spine. Electronically Signed   By: Marijo Conception M.D.   On: 12/10/2019 18:34   CT Head Wo  Contrast  Result Date: 12/06/2019 CLINICAL DATA:  Unwitnessed fall, possible seizure, history of dementia EXAM: CT HEAD WITHOUT CONTRAST CT CERVICAL SPINE WITHOUT CONTRAST TECHNIQUE: Multidetector CT imaging of the head and cervical spine was performed following the standard protocol without intravenous contrast. Multiplanar CT image reconstructions of the cervical spine were also generated. COMPARISON:  11/01/2019 FINDINGS: CT HEAD FINDINGS Brain: No evidence of acute infarction, hemorrhage, hydrocephalus, extra-axial collection or mass lesion/mass effect. Periventricular and deep white matter hypodensity. Vascular: No hyperdense vessel or unexpected calcification. Skull: Normal. Negative for fracture or focal lesion. Sinuses/Orbits: No acute finding. Other: None. CT CERVICAL SPINE FINDINGS Alignment: Normal. Skull base and vertebrae: No acute fracture. No primary bone lesion or focal pathologic process. Soft tissues and spinal canal: No prevertebral fluid or swelling. No visible canal hematoma. Disc levels: Moderate to severe disc space height loss and osteophytosis of the lower cervical spine, worst at C5-C6. Upper chest: Negative. Other: None. IMPRESSION: 1. No acute intracranial pathology. Small-vessel white matter disease. 2. No fracture or static subluxation of the cervical spine. 3. Moderate to severe disc space height loss and osteophytosis of the lower cervical spine, worst at C5-C6. Electronically Signed   By: Eddie Candle M.D.   On: 12/06/2019 14:10   CT Cervical Spine Wo Contrast  Result Date: 12/31/2019 CLINICAL DATA:  Fall EXAM: CT HEAD WITHOUT CONTRAST TECHNIQUE: Contiguous axial images were obtained from the base of the skull through the vertex without intravenous contrast. COMPARISON:  December 24, 2019 FINDINGS: Brain: No evidence of acute territorial infarction, hemorrhage, hydrocephalus,extra-axial collection or mass lesion/mass effect. There is dilatation the ventricles and sulci consistent  with age-related atrophy. Low-attenuation changes in the deep white matter consistent with small vessel ischemia. Vascular: No hyperdense vessel or unexpected calcification. Skull: The skull is intact. No fracture or focal lesion identified. Sinuses/Orbits: The visualized paranasal sinuses and mastoid air cells are clear. The orbits and globes intact. Other: None Cervical spine: Somewhat limited due to patient motion. Alignment: There is a minimal anterolisthesis of C3 on C4 measuring 2 mm. Skull base and vertebrae: Visualized skull base is intact. No atlanto-occipital dissociation. The vertebral body heights are well maintained. No fracture or pathologic osseous lesion seen. Soft tissues and spinal canal: The visualized paraspinal soft tissues are unremarkable. No prevertebral soft tissue swelling is seen. The spinal canal is grossly unremarkable, no large epidural collection or  significant canal narrowing. Disc levels: Multilevel cervical spine spondylosis is seen with anterior osteophytes disc osteophyte complex and uncovertebral osteophytes most notable at C5-C6 with severe neural foraminal narrowing and mild to moderate central canal stenosis. Upper chest: The lung apices are clear. Thoracic inlet is within normal limits. Other: None IMPRESSION: No acute intracranial abnormality. Findings consistent with age related atrophy and chronic small vessel ischemia No acute fracture or malalignment of the spine. Cervical spine spondylosis most notable at C5-C6. Electronically Signed   By: Prudencio Pair M.D.   On: 12/31/2019 21:22   CT Cervical Spine Wo Contrast  Result Date: 12/24/2019 CLINICAL DATA:  Head trauma, minor. Unwitnessed fall, neck pain, dementia. EXAM: CT HEAD WITHOUT CONTRAST CT CERVICAL SPINE WITHOUT CONTRAST TECHNIQUE: Multidetector CT imaging of the head and cervical spine was performed following the standard protocol without intravenous contrast. Multiplanar CT image reconstructions of the cervical  spine were also generated. COMPARISON:  Head CT 12/10/2019, CT cervical spine 12/10/2019. FINDINGS: CT HEAD FINDINGS Brain: Stable mild generalized parenchymal atrophy and chronic small vessel ischemic disease. There is no acute intracranial hemorrhage. No demarcated cortical infarct. No extra-axial fluid collection. No evidence of intracranial mass. No midline shift. Vascular: No hyperdense vessel. Skull: Normal. Negative for fracture or focal lesion. Sinuses/Orbits: Visualized orbits show no acute finding. Mild ethmoid sinus mucosal thickening. No significant mastoid effusion. CT CERVICAL SPINE FINDINGS Alignment: Straightening of the expected cervical lordosis. Unchanged 2 mm C3-C4 grade 1 anterolisthesis. Skull base and vertebrae: The basion-dental and atlanto-dental intervals are maintained.No evidence of acute fracture to the cervical spine. Soft tissues and spinal canal: No prevertebral fluid or swelling. No visible canal hematoma. Disc levels: Cervical spondylosis with multilevel disc space narrowing, posterior disc osteophytes, uncovertebral and facet hypertrophy. Disc space narrowing is severe at the C4-C5 through C7-T1 levels. Degenerative fusion across the C4-C5 and C5-C6 disc spaces. Additionally, there is degenerative fusion across the facet joints bilaterally at C4-C5. A posterior disc osteophyte complex contributes to mild/moderate bony spinal canal stenosis at C5-C6. Multilevel bony neural foraminal narrowing. Upper chest: No consolidation within the imaged lung apices. No visible pneumothorax. IMPRESSION: CT head: 1. No evidence of acute intracranial abnormality. 2. Stable mild generalized parenchymal atrophy and chronic small vessel ischemic disease. 3. Mild ethmoid sinus mucosal thickening. CT cervical spine: 1. No evidence of acute fracture to the cervical spine. 2. Unchanged 2 mm C3-C4 grade 1 anterolisthesis. 3. Advanced cervical spondylosis as outlined. Electronically Signed   By: Kellie Simmering DO   On: 12/24/2019 13:11   CT Cervical Spine Wo Contrast  Result Date: 12/10/2019 CLINICAL DATA:  Posttraumatic headache after witnessed fall. EXAM: CT HEAD WITHOUT CONTRAST CT CERVICAL SPINE WITHOUT CONTRAST TECHNIQUE: Multidetector CT imaging of the head and cervical spine was performed following the standard protocol without intravenous contrast. Multiplanar CT image reconstructions of the cervical spine were also generated. COMPARISON:  December 06, 2019. FINDINGS: CT HEAD FINDINGS Brain: Mild diffuse cortical atrophy is noted. Mild chronic ischemic white matter disease is noted. No mass effect or midline shift is noted. Ventricular size is within normal limits. There is no evidence of mass lesion, hemorrhage or acute infarction. Vascular: No hyperdense vessel or unexpected calcification. Skull: Normal. Negative for fracture or focal lesion. Sinuses/Orbits: No acute finding. Other: None. CT CERVICAL SPINE FINDINGS Alignment: Mild grade 1 anterolisthesis of C3-4 is noted secondary to posterior facet joint hypertrophy. Skull base and vertebrae: No acute fracture. No primary bone lesion or focal pathologic process. Soft tissues and  spinal canal: No prevertebral fluid or swelling. No visible canal hematoma. Disc levels: Severe degenerative disc disease is noted at C4-5, C5-6, C6-7 and C7-T1. Upper chest: Negative. Other: Degenerative changes are seen involving posterior facet joints bilaterally. IMPRESSION: 1. Mild diffuse cortical atrophy. Mild chronic ischemic white matter disease. No acute intracranial abnormality seen. 2. Severe multilevel degenerative disc disease. No acute abnormality seen in the cervical spine. Electronically Signed   By: Marijo Conception M.D.   On: 12/10/2019 18:34   CT Cervical Spine Wo Contrast  Result Date: 12/06/2019 CLINICAL DATA:  Unwitnessed fall, possible seizure, history of dementia EXAM: CT HEAD WITHOUT CONTRAST CT CERVICAL SPINE WITHOUT CONTRAST TECHNIQUE: Multidetector  CT imaging of the head and cervical spine was performed following the standard protocol without intravenous contrast. Multiplanar CT image reconstructions of the cervical spine were also generated. COMPARISON:  11/01/2019 FINDINGS: CT HEAD FINDINGS Brain: No evidence of acute infarction, hemorrhage, hydrocephalus, extra-axial collection or mass lesion/mass effect. Periventricular and deep white matter hypodensity. Vascular: No hyperdense vessel or unexpected calcification. Skull: Normal. Negative for fracture or focal lesion. Sinuses/Orbits: No acute finding. Other: None. CT CERVICAL SPINE FINDINGS Alignment: Normal. Skull base and vertebrae: No acute fracture. No primary bone lesion or focal pathologic process. Soft tissues and spinal canal: No prevertebral fluid or swelling. No visible canal hematoma. Disc levels: Moderate to severe disc space height loss and osteophytosis of the lower cervical spine, worst at C5-C6. Upper chest: Negative. Other: None. IMPRESSION: 1. No acute intracranial pathology. Small-vessel white matter disease. 2. No fracture or static subluxation of the cervical spine. 3. Moderate to severe disc space height loss and osteophytosis of the lower cervical spine, worst at C5-C6. Electronically Signed   By: Eddie Candle M.D.   On: 12/06/2019 14:10   CT ABDOMEN PELVIS W CONTRAST  Result Date: 01/01/2020 CLINICAL DATA:  Fall and tripped, right hip pain EXAM: CT ABDOMEN AND PELVIS WITH CONTRAST TECHNIQUE: Multidetector CT imaging of the abdomen and pelvis was performed using the standard protocol following bolus administration of intravenous contrast. CONTRAST:  152mL OMNIPAQUE IOHEXOL 300 MG/ML  SOLN COMPARISON:  Radiograph same day FINDINGS: Hepatobiliary: Homogeneous hepatic attenuation without traumatic injury. No focal lesion. Gallbladder physiologically distended, no calcified stone. No biliary dilatation. Pancreas: No evidence for traumatic injury. Fatty atrophy of the pancreas is noted.  No ductal dilatation or inflammation. Spleen: Homogeneous attenuation without traumatic injury. Normal in size. Adrenals/Urinary Tract: No adrenal hemorrhage. Kidneys demonstrate symmetric enhancement and excretion on delayed phase imaging. No evidence or renal injury. Ureters are well opacified proximal through mid portion. Bladder is physiologically distended without wall thickening. Stomach/Bowel: Suboptimally assessed without enteric contrast, allowing for this, no evidence of bowel injury. Stomach physiologically distended. There are no dilated or thickened small or large bowel loops. Moderate stool burden. No evidence of mesenteric hematoma. No free air free fluid. Vascular/Lymphatic: No acute vascular injury. The abdominal aorta and IVC are intact. Scattered aortic atherosclerotic calcifications are seen without aneurysmal dilatation. No evidence of retroperitoneal, abdominal, or pelvic adenopathy. Reproductive: No acute abnormality. Other: No focal contusion or abnormality of the abdominal wall. Musculoskeletal: There is a comminuted slightly impacted fracture involving the right femoral greater tuberosity. Overlying soft tissue swelling seen along the right lateral hip. There is also nondisplaced fractures of the posterior right tenth and eleventh ribs. There appears to be age indeterminate, slight compression deformities of the L3 and L4 vertebral bodies with less than 25% loss in height. A x stop fixation is noted  at L4-L5. IMPRESSION: Comminuted impacted nondisplaced fracture of the right greater trochanter. Nondisplaced posterior right tenth and eleventh rib fractures. Age indeterminate slight superior compression deformities of the L3 and L4 vertebral bodies with less than 25% loss height. No acute intra-abdominal or pelvic injury. Aortic Atherosclerosis (ICD10-I70.0). Electronically Signed   By: Prudencio Pair M.D.   On: 01/01/2020 01:29   CT L-SPINE NO CHARGE  Result Date: 01/01/2020 CLINICAL DATA:   Fall and pain EXAM: CT LUMBAR SPINE WITHOUT CONTRAST TECHNIQUE: Multidetector CT imaging of the lumbar spine was performed without intravenous contrast administration. Multiplanar CT image reconstructions were also generated. COMPARISON:  None. FINDINGS: Segmentation: There are 5 non-rib bearing lumbar type vertebral bodies with the last intervertebral disc space labeled as L5-S1. 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 A grade 1 anterolisthesis of L4 on L5 is seen measuring 4 mm. Vertebrae: There is age indeterminate slight superior compression deformity of the L3 and L4 vertebral bodies with less than 25% loss in height. There is a posterior X stop fixation at L4-L5. Paraspinal and other soft tissues: The paraspinal soft tissues and visualized retroperitoneal structures are unremarkable. The sacroiliac joints are intact. Scattered aortic atherosclerosis is. Disc levels: Mild disc height loss with facet arthropathy seen throughout the lumbar spine. No significant canal or neural foraminal stenosis however is noted IMPRESSION: Age indeterminate slight superior compression deformities of the L3 and L4 vertebral bodies with less than 25% loss in height. Electronically Signed   By: Prudencio Pair M.D.   On: 01/01/2020 01:35   DG Chest Portable 1 View  Result Date: 12/31/2019 CLINICAL DATA:  Fall EXAM: PORTABLE CHEST 1 VIEW COMPARISON:  06/14/2018 FINDINGS: The heart size and mediastinal contours are within normal limits. Both lungs are clear. The visualized skeletal structures are unremarkable. IMPRESSION: No active disease. Electronically Signed   By: Donavan Foil M.D.   On: 12/31/2019 22:43   DG Hip Unilat W or Wo Pelvis 2-3 Views Right  Result Date: 12/31/2019 CLINICAL DATA:  Trip and fall injury with right-sided hip pain. EXAM: DG HIP (WITH OR WITHOUT PELVIS) 2-3V RIGHT COMPARISON:  06/14/2018 mild degenerative changes in the right hip. No change in appearance since prior  study. No evidence of acute fracture or dislocation. No focal bone lesion or bone destruction. Pelvis and sacrum appear intact. Degenerative changes in the lower lumbar spine. FINDINGS: Mild degenerative changes in the right hip. No change in appearance since prior study. No evidence of acute fracture or dislocation. No focal bone lesion or bone destruction. Pelvis and sacrum appear intact. Degenerative changes in the lower lumbar spine. IMPRESSION: No acute bony abnormalities. Electronically Signed   By: Lucienne Capers M.D.   On: 12/31/2019 21:07    SIGNED: Deatra James, MD, FACP, FHM. [facility],  Pager (please use [email or web address] to page/text)  If 7PM-7AM, please contact night-coverage [email or web address] Ota [facility] 01/02/2020, 9:32 AM

## 2020-01-02 NOTE — Consult Note (Signed)
Consultation Note Date: 01/02/2020   Patient Name: Stacy Davis  DOB: 06-Dec-1945  MRN: [id number]  Age / Sex: 74 y.o., female  PCP: Stacy Frizzle, MD Referring Physician: Deatra James, MD  Reason for Consultation: Establishing goals of care  HPI/Patient Profile: 74 y.o. female  admitted on 12/31/2019  Ambry Dix Hutchensis a 74 y.o.femalewithhistory of advanced dementia, hypothyroidism, seizures, depression and hypothyroidism was brought to the ER after patient had a fall while walking and tripped on the walker. Patient was complaining of right hip pain was brought to the ER.  ED: Patient had CT head C-spine and CT abdomen and pelvis.  CT scan showed right-sided comminuted impacted nondisplaced fracture of the right greater trochanter and nondisplaced fracture of the right 10th and 11th rib.  There was also an age-indeterminate compression fracture of L3 and L4. Lab work was significant for hemoglobin of 11/ Covid test was negative.   Orthopedic surgeon Dr. Veverly Davis consulted. Patient with R trochanteric proximal femur fracture after recent fall, surgery deemed not essential due to no extension into the femoral neck area. Monitor PO and PT participation and efforts.   Clinical Assessment and Goals of Care: A palliative consult has been requested for goals of care considerations.   [name] is resting in bed, she opens her eyes, doesn't verbalize much. She is in no distress, resting comfortably. Chart reviewed. Patient was seen recently by outpatient palliative care services at [facility] by LCSW in July 2021 where patient/family elected for full code full scope of care. She has a son Stacy Davis, call placed unable to reach him this afternoon.   Palliative medicine is specialized medical care for people living with serious illness. It focuses on providing relief from the symptoms and  stress of a serious illness. The goal is to improve quality of life for both the patient and the family.  Goals of care: Broad aims of medical therapy in relation to the patient's values and preferences. Our aim is to provide medical care aimed at enabling patients to achieve the goals that matter most to them, given the circumstances of their particular medical situation and their constraints.   See below.    NEXT OF KIN  son Stacy Davis [phone]  SUMMARY OF RECOMMENDATIONS    full code for now.  Continue current mode of care Recommend SNF rehab with palliative on discharge, patient already connected with outpatient palliative services based on chart review.  Thank you for the consult.   Code Status/Advance Care Planning:  Full code    Symptom Management:   As above.    Palliative Prophylaxis:   Delirium Protocol  Additional Recommendations (Limitations, Scope, Preferences):  Full Scope Treatment  Psycho-social/Spiritual:   Desire for further Chaplaincy support:yes  Additional Recommendations: Caregiving  Support/Resources  Prognosis:   Unable to determine  Discharge Planning: [place] for rehab with Palliative care service follow-up      Primary Diagnoses: Present on Admission: . Trochanteric fracture ([place]) . HTN (hypertension) . Memory loss .  Hypothyroid . Von Willebrand disease ([place]) . Dementia ([place]) . Closed rib fracture . Anemia . Hip fx ([place])   I have reviewed the medical record, interviewed the patient and family, and examined the patient. The following aspects are pertinent.  Past Medical History:  Diagnosis Date  . Anxiety   . Arthritis    ?OA vs Rheum (established with Dr. Jefm Davis pending blood work)  . Articular cartilage disorder of shoulder 04/2012   right  . Back pain    h/o DDD since 2010  . Basal cell carcinoma    right dorsum nose, right forhead  . Dementia ([place])   . Dementia ([place])   . Dental crowns  present    also dental caps  . Diabetes mellitus    NIDDM  . Fecal incontinence   . GERD (gastroesophageal reflux disease)   . Headache(784.0)    occasional  . History of skin cancer   . Hyperlipidemia    no current med.  . Hypertension   . Hyperthyroidism   . Hypothyroid   . Impingement syndrome of right shoulder 04/2012  . Memory impairment    dementia- worsenign 12/2016 with medication non compliance and delusions.    . Orthostatic dizziness   . Right rotator cuff tear 05/15/2012  . Rotator cuff rupture 04/2012   right  . Seizures ([place])   . 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 Von Willebrand disease ([place])    Social History   Socioeconomic History  . Marital status: Married    Spouse name: Stacy Davis  . Number of children: 2  . Years of education: 20  . Highest education level: Not on file  Occupational History  . Occupation: Retired    Comment: retired  Tobacco Use  . Smoking status: Never Smoker  . Smokeless tobacco: Never Used  Vaping Use  . Vaping Use: Never used  Substance and Sexual Activity  . Alcohol use: No    Alcohol/week: 0.0 standard drinks  . Drug use: No  . Sexual activity: Not Currently    Birth control/protection: Surgical  Other Topics Concern  . Not on file  Social History Narrative   Patient lives at home with her husband Stacy Davis).   Patient is retired .   Education high school.   Right handed.   Caffeine sweet tea two cups.      Social Determinants of Health   Financial Resource Strain:   . Difficulty of Paying Living Expenses:   Food Insecurity:   . Worried About Charity fundraiser in the Last Year:   . Arboriculturist in the Last Year:   Transportation Needs:   . Film/video editor (Medical):   Marland Kitchen Lack of Transportation (Non-Medical):   Physical Activity:   . Days of Exercise per Week:   . Minutes of Exercise per Session:   Stress:   . Feeling of Stress :   Social Connections:   . Frequency of  Communication with Friends and Family:   . Frequency of Social Gatherings with Friends and Family:   . Attends Religious Services:   . Active Member of Clubs or Organizations:   . Attends Archivist Meetings:   Marland Kitchen Marital Status:    Family History  Problem Relation Age of Onset  . Dementia Mother   . Arthritis Mother   . Cancer Father        unknown primary  . Heart disease Father   .  Thyroid disease Sister   . Cancer Sister        brain tumor  . Dementia Sister   . COPD Brother   . Cancer Brother        bone cancer  . Colon cancer Paternal Grandfather   . Breast cancer Neg Hx    Scheduled Meds: . levETIRAcetam  500 mg Oral Daily   And  . levETIRAcetam  1,000 mg Oral QHS  . levothyroxine  88 mcg Oral Daily  . lidocaine  1 patch Transdermal Q24H  . melatonin  3 mg Oral QHS  . pantoprazole  40 mg Oral Daily  . QUEtiapine  25 mg Oral QHS  . sertraline  50 mg Oral Daily   Continuous Infusions: PRN Meds:.acetaminophen, ketorolac, LORazepam, ondansetron **OR** ondansetron (ZOFRAN) IV Medications Prior to Admission:  Prior to Admission medications   Medication Sig Start Date End Date Taking? Authorizing Provider  levETIRAcetam (KEPPRA XR) 500 MG 24 hr tablet Take 2 tablets (1,000 mg total) by mouth at bedtime AND 1 tablet (500 mg total) in the morning. 11/01/19  Yes Dorie Rank, MD  levothyroxine (SYNTHROID) 88 MCG tablet TAKE 1 TABLET BY MOUTH ONCE A DAY Patient taking differently: Take 88 mcg by mouth daily.  10/05/18  Yes Stacy Frizzle, MD  LORazepam (ATIVAN) 0.5 MG tablet Take 0.5 mg by mouth 2 (two) times daily as needed for anxiety.   Yes [provider]  melatonin 3 MG TABS tablet Take 3 mg by mouth at bedtime.   Yes [provider]  omeprazole (PRILOSEC) 40 MG capsule TAKE 1 CAPSULE BY MOUTH ONCE DAILY Patient taking differently: Take 40 mg by mouth daily.  04/01/19  Yes Stacy Frizzle, MD  QUEtiapine (SEROQUEL) 25 MG tablet Take 1 tablet  (25 mg total) by mouth at bedtime. 05/05/19  Yes Marcial Pacas, MD  sertraline (ZOLOFT) 50 MG tablet Take 1 tablet (50 mg total) by mouth daily. 05/05/19  Yes Marcial Pacas, MD  traMADol (ULTRAM) 50 MG tablet Take 50 mg by mouth 4 (four) times daily as needed for pain. 12/29/19  Yes [provider]   Allergies  Allergen Reactions  . Tramadol Other (See Comments)    CHANGES IN MEMORY   Review of Systems Denies pain.  Physical Exam Elderly appearing lady No distress Is with generalized weakness Regular work of breathing S 1 S 2 Dressing noted.   Vital Signs: BP 118/66 (BP Location: Left Arm)   Pulse (!) 56   Temp (!) 97.4 F (36.3 C) (Oral)   Resp 14   Ht 5\' 3"  (1.6 m)   Wt 52.8 kg   SpO2 97%   BMI 20.62 kg/m  Pain Scale: Faces   Pain Score: 0-No pain   SpO2: SpO2: 97 % O2 Device:SpO2: 97 % O2 Flow Rate: .   IO: Intake/output summary:   Intake/Output Summary (Last 24 hours) at 01/02/2020 1309 Last data filed at 01/02/2020 1130 Gross per 24 hour  Intake 200 ml  Output 1000 ml  Net -800 ml    LBM:   Baseline Weight: Weight: 52.8 kg Most recent weight: Weight: 52.8 kg     Palliative Assessment/Data:   PPS 30%  Time In:  12 Time Out:  1300 Time Total:  60 min.  Greater than 50%  of this time was spent counseling and coordinating care related to the above assessment and plan.  Signed by: Loistine Chance, MD   Please contact Palliative Medicine Team phone at [phone]  for questions and concerns.  For individual provider: See Shea Evans

## 2020-01-03 DIAGNOSIS — S72009G Fracture of unspecified part of neck of unspecified femur, subsequent encounter for closed fracture with delayed healing: Secondary | ICD-10-CM

## 2020-01-03 DIAGNOSIS — R531 Weakness: Secondary | ICD-10-CM

## 2020-01-03 DIAGNOSIS — Z7189 Other specified counseling: Secondary | ICD-10-CM

## 2020-01-03 DIAGNOSIS — Z515 Encounter for palliative care: Secondary | ICD-10-CM

## 2020-01-03 LAB — GLUCOSE, CAPILLARY
Glucose-Capillary: 105 mg/dL — ABNORMAL HIGH (ref 70–99)
Glucose-Capillary: 118 mg/dL — ABNORMAL HIGH (ref 70–99)
Glucose-Capillary: 190 mg/dL — ABNORMAL HIGH (ref 70–99)
Glucose-Capillary: 83 mg/dL (ref 70–99)
Glucose-Capillary: 95 mg/dL (ref 70–99)
Glucose-Capillary: 97 mg/dL (ref 70–99)

## 2020-01-03 LAB — CBC
HCT: 33.7 % — ABNORMAL LOW (ref 36.0–46.0)
Hemoglobin: 11 g/dL — ABNORMAL LOW (ref 12.0–15.0)
MCH: 27.7 pg (ref 26.0–34.0)
MCHC: 32.6 g/dL (ref 30.0–36.0)
MCV: 84.9 fL (ref 80.0–100.0)
Platelets: 184 10*3/uL (ref 150–400)
RBC: 3.97 MIL/uL (ref 3.87–5.11)
RDW: 13.6 % (ref 11.5–15.5)
WBC: 5.4 10*3/uL (ref 4.0–10.5)
nRBC: 0 % (ref 0.0–0.2)

## 2020-01-03 MED ORDER — ASPIRIN EC 325 MG PO TBEC
325.0000 mg | DELAYED_RELEASE_TABLET | Freq: Every day | ORAL | 3 refills | Status: DC
Start: 2020-01-03 — End: 2020-01-04

## 2020-01-03 MED ORDER — KETOROLAC TROMETHAMINE 10 MG PO TABS: 10.0000 mg | ORAL_TABLET | Freq: Four times a day (QID) | ORAL | 0 refills | Status: DC | PRN

## 2020-01-03 NOTE — Progress Notes (Signed)
PMT progress note  Patient awakens easily, opens eyes, tracks me in the room. She repeats "pain" if I ask her whether she is hurting. Does not have non verbal gestures of distress or discomfort. She is watching cartoons in her room.  Chart reviewed, anticipate D/C to SNF soon.  BP 105/63 (BP Location: Left Arm)   Pulse (!) 58   Temp 97.7 F (36.5 C) (Axillary)   Resp 14   Ht 5\' 3"  (1.6 m)   Wt 52.8 kg   SpO2 99%   BMI 20.62 kg/m  No distress Elderly lady, hip site noted, R UE also in dressing Regular work of breathing S 1 S 2  Abdomen is not distended No edema  PPS 30%  R hip fracture, no surgical intervention, pain management and PT, out patient ortho follow up.  Patient is already connected with out patient palliative services, she has recently been seen by palliative care in the outpatient setting. Recommend continuation of palliative services at [facility] rehab for ongoing discussions with patient/family about her overall disease trajectory.   15 minutes spent.  Greater than 50%  of this time was spent counseling and coordinating care related to the above assessment and plan. Loistine Chance MD [place] palliative [phone].

## 2020-01-03 NOTE — Progress Notes (Signed)
Orthopedics Progress Note  Subjective: Patient reports no pain while in bed  Objective:  Vitals:   01/02/20 1945 01/03/20 0614  BP: 91/60 105/63  Pulse: 92 (!) 58  Resp: 20 14  Temp: 98.8 F (37.1 C) 97.7 F (36.5 C)  SpO2: 98% 99%    General: Awake and alert  Musculoskeletal: No pain with palpation of the T and L spine or the posterior sacral/pelvic area No pain with gentle AROM of the right leg while laying in bed Neurovascularly intact  Lab Results  Component Value Date   WBC 5.4 01/03/2020   HGB 11.0 (L) 01/03/2020   HCT 33.7 (L) 01/03/2020   MCV 84.9 01/03/2020   PLT 184 01/03/2020       Component Value Date/Time   NA 137 01/01/2020 0500   K 3.5 01/01/2020 0500   CL 103 01/01/2020 0500   CO2 25 01/01/2020 0500   GLUCOSE 88 01/01/2020 0500   BUN 22 01/01/2020 0500   CREATININE 0.59 01/01/2020 0500   CREATININE 0.92 06/25/2018 1513   CALCIUM 8.9 01/01/2020 0500   GFRNONAA >60 01/01/2020 0500   GFRNONAA 63 06/25/2018 1513   GFRAA >60 01/01/2020 0500   GFRAA 73 06/25/2018 1513    Lab Results  Component Value Date   INR 1.1 01/01/2020   INR 1.0 12/10/2019   INR 1.02 04/11/2018    Assessment/Plan: Patient with a greater trochanter fracture after fall. No surgery needed for this. Partial WB up with therapy Will likely need SNF given both the broken wrist and broken hip. Lumbar findings likely chronic/ remote given no tenderness.  She has a Grade I L4-5 spondylolisthesis which looks stable as well  She is a very High Fall Risk DVT prophylaxis Will need outpatient follow up   Doran Heater. Veverly Fells, MD 01/03/2020 11:30 AM

## 2020-01-03 NOTE — Discharge Summary (Signed)
Physician Discharge Summary [facility]    Patient: Stacy Davis                   Admit date: 12/31/2019   DOB: 02-Oct-1945             Discharge date:01/03/2020/8:47 AM EVO:[id number]                          PCP: Susy Frizzle, MD  Disposition: SNF  Recommendations for Outpatient Follow-up:   . Follow up: in 1 week ortho   Discharge Condition: Stable   Code Status:   Code Status: Full Code  Diet recommendation: Regular healthy diet   Discharge Diagnoses:    Active Problems:   HTN (hypertension)   Memory loss   Hypothyroid   Von Willebrand disease ([place])   Dementia ([place])   Trochanteric fracture ([place])   Closed rib fracture   Anemia   Hip fx ([place])   History of Present Illness/ Hospital Course Stacy Davis Summary:   [name] a 74 y.o.femalewithhistory of advanced dementia, hypothyroidism, seizures, depression and hypothyroidism was brought to the ER after patient had a fall while walking and tripped on the walker. Patient was complaining of right hip pain was brought to the ER.  ED: Patient had CT head C-spine and CT abdomen and pelvis.  CT scan showed right-sided comminuted impacted nondisplaced fracture of the right greater trochanter and nondisplaced fracture of the right 10th and 11th rib.  There was also an age-indeterminate compression fracture of L3 and L4. Lab work was significant for hemoglobin of 11/ Covid test was negative.   Orthopedic surgeon Dr. Veverly Fells consulted.   Recommend no surgical intervention, continue aggressive PT for mobilization, minimal weightbearing to the right to 25%, follow-up in office     Assessment & Plan:   Active Problems:   HTN (hypertension)   Memory loss   Hypothyroid   Von Willebrand disease ([place])   Dementia ([place])   Trochanteric fracture ([place])   Closed rib fracture   Anemia   Hip fx ([facility])  Right hip fracture: -Hemodynamically stable, -Continue pain management -Status post  Ortho evaluation-recommended no surgical intervention, Recommending mobilization minimal weightbearing on the right hip 25%, Outpatient follow-up -Patient has been n.p.o., and was able diet as tolerated  Right-sided commuted impacted nondisplaced fracture of the right greater trochanter and also fractures of the right 10th and 11th rib and age-indeterminate fracture of the lumbar L3-L4 spine after fall for which Dr. Veverly Fells orthopedic surgeon has been consulted.   -Standing from aggressive pain pain medication due to sedative, somnolent, and altered mental status.  Currently high-dose NSAIDs, alternated with Tylenol recommended.  -Status post evaluation by orthopedic team recommending no surgical intervention -continue aggressive PT for mobilization with minimal WB on her right hip 25%-follow-up with orthopedic team in office.    Hypothyroidism on Synthroid.  History of seizures on Keppra which have dosed as IV for now.  Anemia appears to be chronic follow CBC.  Advance Dementia and depression on Seroquel and Zoloft.  History of von Willebrand disease ... -Heme-onc Dr. Alen Blew consulted -after thorough investigation recommended no hold back as far as surgical intervention, no need for treatment, as of 01/01/2020 PT, PTT normal, normal platelets,  Per Dr. Tami Lin: "history of presumed von Willebrand's disease. Reviewing laboratory data in the last 11 years I see no clear-cut evidence to suggest she has this condition. Her von Willebrand's disease panel obtained  in September 2010 was normal. She has had multiple surgical procedures since that time without any postoperative complications."   ---------------------------------------------------------------------------------------------------------------------------- DVT prophylaxis:  Continue  SCD/Compression stockings -aspirin Code Status:   Code Status: Full Code -palliative care was consulted-recommending continue palliative care at  SNF Family Communication: No family member present at bedside- attempt Was made to call his son POA Mr. Dawne Casali at [phone] message was left   -Advance care planning has been discussed.   Admission status:   Status is: Observation  The patient remains OBS appropriate and will d/c before 2 midnights.  Dispo: The patient is from: SNF  Anticipated d/c is to: SNF   Nutritional status:          Discharge Instructions:   Discharge Instructions    Activity as tolerated - No restrictions   Complete by: As directed    Diet - low sodium heart healthy   Complete by: As directed    Discharge instructions   Complete by: As directed    Fall precaution.  Follow-up with orthopedic team as scheduled.  PT OT per orthopedic recommendations   Increase activity slowly   Complete by: As directed        Medication List    STOP taking these medications   traMADol 50 MG tablet Commonly known as: ULTRAM     TAKE these medications   aspirin EC 325 MG tablet Take 1 tablet (325 mg total) by mouth daily.   ketorolac 10 MG tablet Commonly known as: TORADOL Take 1 tablet (10 mg total) by mouth every 6 (six) hours as needed for moderate pain or severe pain.   levETIRAcetam 500 MG 24 hr tablet Commonly known as: Keppra XR Take 2 tablets (1,000 mg total) by mouth at bedtime AND 1 tablet (500 mg total) in the morning.   levothyroxine 88 MCG tablet Commonly known as: SYNTHROID TAKE 1 TABLET BY MOUTH ONCE A DAY   LORazepam 0.5 MG tablet Commonly known as: ATIVAN Take 0.5 mg by mouth 2 (two) times daily as needed for anxiety.   melatonin 3 MG Tabs tablet Take 3 mg by mouth at bedtime.   omeprazole 40 MG capsule Commonly known as: PRILOSEC TAKE 1 CAPSULE BY MOUTH ONCE DAILY   QUEtiapine 25 MG tablet Commonly known as: SEROquel Take 1 tablet (25 mg total) by mouth at bedtime.   sertraline 50 MG tablet Commonly known as: ZOLOFT Take 1 tablet (50 mg  total) by mouth daily.       Allergies  Allergen Reactions  . Tramadol Other (See Comments)    CHANGES IN MEMORY     Procedures /Studies:   DG Wrist Complete Right  Result Date: 12/24/2019 CLINICAL DATA:  Fall, pain EXAM: RIGHT WRIST - COMPLETE 3+ VIEW COMPARISON:  November 01, 2019 FINDINGS: There is a transverse acute fracture of the distal right radius with dorsal angulation. No substantial displacement identified. No definite radiocarpal extension. IMPRESSION: Acute transverse fracture of the distal right radius with dorsal angulation. Electronically Signed   By: Macy Mis M.D.   On: 12/24/2019 13:23   CT Head Wo Contrast  Result Date: 12/31/2019 CLINICAL DATA:  Fall EXAM: CT HEAD WITHOUT CONTRAST TECHNIQUE: Contiguous axial images were obtained from the base of the skull through the vertex without intravenous contrast. COMPARISON:  December 24, 2019 FINDINGS: Brain: No evidence of acute territorial infarction, hemorrhage, hydrocephalus,extra-axial collection or mass lesion/mass effect. There is dilatation the ventricles and sulci consistent with age-related atrophy. Low-attenuation changes in  the deep white matter consistent with small vessel ischemia. Vascular: No hyperdense vessel or unexpected calcification. Skull: The skull is intact. No fracture or focal lesion identified. Sinuses/Orbits: The visualized paranasal sinuses and mastoid air cells are clear. The orbits and globes intact. Other: None Cervical spine: Somewhat limited due to patient motion. Alignment: There is a minimal anterolisthesis of C3 on C4 measuring 2 mm. Skull base and vertebrae: Visualized skull base is intact. No atlanto-occipital dissociation. The vertebral body heights are well maintained. No fracture or pathologic osseous lesion seen. Soft tissues and spinal canal: The visualized paraspinal soft tissues are unremarkable. No prevertebral soft tissue swelling is seen. The spinal canal is grossly unremarkable, no large  epidural collection or significant canal narrowing. Disc levels: Multilevel cervical spine spondylosis is seen with anterior osteophytes disc osteophyte complex and uncovertebral osteophytes most notable at C5-C6 with severe neural foraminal narrowing and mild to moderate central canal stenosis. Upper chest: The lung apices are clear. Thoracic inlet is within normal limits. Other: None IMPRESSION: No acute intracranial abnormality. Findings consistent with age related atrophy and chronic small vessel ischemia No acute fracture or malalignment of the spine. Cervical spine spondylosis most notable at C5-C6. Electronically Signed   By: Prudencio Pair M.D.   On: 12/31/2019 21:22   CT Head Wo Contrast  Result Date: 12/24/2019 CLINICAL DATA:  Head trauma, minor. Unwitnessed fall, neck pain, dementia. EXAM: CT HEAD WITHOUT CONTRAST CT CERVICAL SPINE WITHOUT CONTRAST TECHNIQUE: Multidetector CT imaging of the head and cervical spine was performed following the standard protocol without intravenous contrast. Multiplanar CT image reconstructions of the cervical spine were also generated. COMPARISON:  Head CT 12/10/2019, CT cervical spine 12/10/2019. FINDINGS: CT HEAD FINDINGS Brain: Stable mild generalized parenchymal atrophy and chronic small vessel ischemic disease. There is no acute intracranial hemorrhage. No demarcated cortical infarct. No extra-axial fluid collection. No evidence of intracranial mass. No midline shift. Vascular: No hyperdense vessel. Skull: Normal. Negative for fracture or focal lesion. Sinuses/Orbits: Visualized orbits show no acute finding. Mild ethmoid sinus mucosal thickening. No significant mastoid effusion. CT CERVICAL SPINE FINDINGS Alignment: Straightening of the expected cervical lordosis. Unchanged 2 mm C3-C4 grade 1 anterolisthesis. Skull base and vertebrae: The basion-dental and atlanto-dental intervals are maintained.No evidence of acute fracture to the cervical spine. Soft tissues and  spinal canal: No prevertebral fluid or swelling. No visible canal hematoma. Disc levels: Cervical spondylosis with multilevel disc space narrowing, posterior disc osteophytes, uncovertebral and facet hypertrophy. Disc space narrowing is severe at the C4-C5 through C7-T1 levels. Degenerative fusion across the C4-C5 and C5-C6 disc spaces. Additionally, there is degenerative fusion across the facet joints bilaterally at C4-C5. A posterior disc osteophyte complex contributes to mild/moderate bony spinal canal stenosis at C5-C6. Multilevel bony neural foraminal narrowing. Upper chest: No consolidation within the imaged lung apices. No visible pneumothorax. IMPRESSION: CT head: 1. No evidence of acute intracranial abnormality. 2. Stable mild generalized parenchymal atrophy and chronic small vessel ischemic disease. 3. Mild ethmoid sinus mucosal thickening. CT cervical spine: 1. No evidence of acute fracture to the cervical spine. 2. Unchanged 2 mm C3-C4 grade 1 anterolisthesis. 3. Advanced cervical spondylosis as outlined. Electronically Signed   By: Kellie Simmering DO   On: 12/24/2019 13:11   CT Head Wo Contrast  Result Date: 12/10/2019 CLINICAL DATA:  Posttraumatic headache after witnessed fall. EXAM: CT HEAD WITHOUT CONTRAST CT CERVICAL SPINE WITHOUT CONTRAST TECHNIQUE: Multidetector CT imaging of the head and cervical spine was performed following the standard protocol without  intravenous contrast. Multiplanar CT image reconstructions of the cervical spine were also generated. COMPARISON:  December 06, 2019. FINDINGS: CT HEAD FINDINGS Brain: Mild diffuse cortical atrophy is noted. Mild chronic ischemic white matter disease is noted. No mass effect or midline shift is noted. Ventricular size is within normal limits. There is no evidence of mass lesion, hemorrhage or acute infarction. Vascular: No hyperdense vessel or unexpected calcification. Skull: Normal. Negative for fracture or focal lesion. Sinuses/Orbits: No acute  finding. Other: None. CT CERVICAL SPINE FINDINGS Alignment: Mild grade 1 anterolisthesis of C3-4 is noted secondary to posterior facet joint hypertrophy. Skull base and vertebrae: No acute fracture. No primary bone lesion or focal pathologic process. Soft tissues and spinal canal: No prevertebral fluid or swelling. No visible canal hematoma. Disc levels: Severe degenerative disc disease is noted at C4-5, C5-6, C6-7 and C7-T1. Upper chest: Negative. Other: Degenerative changes are seen involving posterior facet joints bilaterally. IMPRESSION: 1. Mild diffuse cortical atrophy. Mild chronic ischemic white matter disease. No acute intracranial abnormality seen. 2. Severe multilevel degenerative disc disease. No acute abnormality seen in the cervical spine. Electronically Signed   By: Marijo Conception M.D.   On: 12/10/2019 18:34   CT Head Wo Contrast  Result Date: 12/06/2019 CLINICAL DATA:  Unwitnessed fall, possible seizure, history of dementia EXAM: CT HEAD WITHOUT CONTRAST CT CERVICAL SPINE WITHOUT CONTRAST TECHNIQUE: Multidetector CT imaging of the head and cervical spine was performed following the standard protocol without intravenous contrast. Multiplanar CT image reconstructions of the cervical spine were also generated. COMPARISON:  11/01/2019 FINDINGS: CT HEAD FINDINGS Brain: No evidence of acute infarction, hemorrhage, hydrocephalus, extra-axial collection or mass lesion/mass effect. Periventricular and deep white matter hypodensity. Vascular: No hyperdense vessel or unexpected calcification. Skull: Normal. Negative for fracture or focal lesion. Sinuses/Orbits: No acute finding. Other: None. CT CERVICAL SPINE FINDINGS Alignment: Normal. Skull base and vertebrae: No acute fracture. No primary bone lesion or focal pathologic process. Soft tissues and spinal canal: No prevertebral fluid or swelling. No visible canal hematoma. Disc levels: Moderate to severe disc space height loss and osteophytosis of the lower  cervical spine, worst at C5-C6. Upper chest: Negative. Other: None. IMPRESSION: 1. No acute intracranial pathology. Small-vessel white matter disease. 2. No fracture or static subluxation of the cervical spine. 3. Moderate to severe disc space height loss and osteophytosis of the lower cervical spine, worst at C5-C6. Electronically Signed   By: Eddie Candle M.D.   On: 12/06/2019 14:10   CT Cervical Spine Wo Contrast  Result Date: 12/31/2019 CLINICAL DATA:  Fall EXAM: CT HEAD WITHOUT CONTRAST TECHNIQUE: Contiguous axial images were obtained from the base of the skull through the vertex without intravenous contrast. COMPARISON:  December 24, 2019 FINDINGS: Brain: No evidence of acute territorial infarction, hemorrhage, hydrocephalus,extra-axial collection or mass lesion/mass effect. There is dilatation the ventricles and sulci consistent with age-related atrophy. Low-attenuation changes in the deep white matter consistent with small vessel ischemia. Vascular: No hyperdense vessel or unexpected calcification. Skull: The skull is intact. No fracture or focal lesion identified. Sinuses/Orbits: The visualized paranasal sinuses and mastoid air cells are clear. The orbits and globes intact. Other: None Cervical spine: Somewhat limited due to patient motion. Alignment: There is a minimal anterolisthesis of C3 on C4 measuring 2 mm. Skull base and vertebrae: Visualized skull base is intact. No atlanto-occipital dissociation. The vertebral body heights are well maintained. No fracture or pathologic osseous lesion seen. Soft tissues and spinal canal: The visualized paraspinal soft tissues are  unremarkable. No prevertebral soft tissue swelling is seen. The spinal canal is grossly unremarkable, no large epidural collection or significant canal narrowing. Disc levels: Multilevel cervical spine spondylosis is seen with anterior osteophytes disc osteophyte complex and uncovertebral osteophytes most notable at C5-C6 with severe neural  foraminal narrowing and mild to moderate central canal stenosis. Upper chest: The lung apices are clear. Thoracic inlet is within normal limits. Other: None IMPRESSION: No acute intracranial abnormality. Findings consistent with age related atrophy and chronic small vessel ischemia No acute fracture or malalignment of the spine. Cervical spine spondylosis most notable at C5-C6. Electronically Signed   By: Prudencio Pair M.D.   On: 12/31/2019 21:22   CT Cervical Spine Wo Contrast  Result Date: 12/24/2019 CLINICAL DATA:  Head trauma, minor. Unwitnessed fall, neck pain, dementia. EXAM: CT HEAD WITHOUT CONTRAST CT CERVICAL SPINE WITHOUT CONTRAST TECHNIQUE: Multidetector CT imaging of the head and cervical spine was performed following the standard protocol without intravenous contrast. Multiplanar CT image reconstructions of the cervical spine were also generated. COMPARISON:  Head CT 12/10/2019, CT cervical spine 12/10/2019. FINDINGS: CT HEAD FINDINGS Brain: Stable mild generalized parenchymal atrophy and chronic small vessel ischemic disease. There is no acute intracranial hemorrhage. No demarcated cortical infarct. No extra-axial fluid collection. No evidence of intracranial mass. No midline shift. Vascular: No hyperdense vessel. Skull: Normal. Negative for fracture or focal lesion. Sinuses/Orbits: Visualized orbits show no acute finding. Mild ethmoid sinus mucosal thickening. No significant mastoid effusion. CT CERVICAL SPINE FINDINGS Alignment: Straightening of the expected cervical lordosis. Unchanged 2 mm C3-C4 grade 1 anterolisthesis. Skull base and vertebrae: The basion-dental and atlanto-dental intervals are maintained.No evidence of acute fracture to the cervical spine. Soft tissues and spinal canal: No prevertebral fluid or swelling. No visible canal hematoma. Disc levels: Cervical spondylosis with multilevel disc space narrowing, posterior disc osteophytes, uncovertebral and facet hypertrophy. Disc space  narrowing is severe at the C4-C5 through C7-T1 levels. Degenerative fusion across the C4-C5 and C5-C6 disc spaces. Additionally, there is degenerative fusion across the facet joints bilaterally at C4-C5. A posterior disc osteophyte complex contributes to mild/moderate bony spinal canal stenosis at C5-C6. Multilevel bony neural foraminal narrowing. Upper chest: No consolidation within the imaged lung apices. No visible pneumothorax. IMPRESSION: CT head: 1. No evidence of acute intracranial abnormality. 2. Stable mild generalized parenchymal atrophy and chronic small vessel ischemic disease. 3. Mild ethmoid sinus mucosal thickening. CT cervical spine: 1. No evidence of acute fracture to the cervical spine. 2. Unchanged 2 mm C3-C4 grade 1 anterolisthesis. 3. Advanced cervical spondylosis as outlined. Electronically Signed   By: Kellie Simmering DO   On: 12/24/2019 13:11   CT Cervical Spine Wo Contrast  Result Date: 12/10/2019 CLINICAL DATA:  Posttraumatic headache after witnessed fall. EXAM: CT HEAD WITHOUT CONTRAST CT CERVICAL SPINE WITHOUT CONTRAST TECHNIQUE: Multidetector CT imaging of the head and cervical spine was performed following the standard protocol without intravenous contrast. Multiplanar CT image reconstructions of the cervical spine were also generated. COMPARISON:  December 06, 2019. FINDINGS: CT HEAD FINDINGS Brain: Mild diffuse cortical atrophy is noted. Mild chronic ischemic white matter disease is noted. No mass effect or midline shift is noted. Ventricular size is within normal limits. There is no evidence of mass lesion, hemorrhage or acute infarction. Vascular: No hyperdense vessel or unexpected calcification. Skull: Normal. Negative for fracture or focal lesion. Sinuses/Orbits: No acute finding. Other: None. CT CERVICAL SPINE FINDINGS Alignment: Mild grade 1 anterolisthesis of C3-4 is noted secondary to posterior facet joint  hypertrophy. Skull base and vertebrae: No acute fracture. No primary bone  lesion or focal pathologic process. Soft tissues and spinal canal: No prevertebral fluid or swelling. No visible canal hematoma. Disc levels: Severe degenerative disc disease is noted at C4-5, C5-6, C6-7 and C7-T1. Upper chest: Negative. Other: Degenerative changes are seen involving posterior facet joints bilaterally. IMPRESSION: 1. Mild diffuse cortical atrophy. Mild chronic ischemic white matter disease. No acute intracranial abnormality seen. 2. Severe multilevel degenerative disc disease. No acute abnormality seen in the cervical spine. Electronically Signed   By: Marijo Conception M.D.   On: 12/10/2019 18:34   CT Cervical Spine Wo Contrast  Result Date: 12/06/2019 CLINICAL DATA:  Unwitnessed fall, possible seizure, history of dementia EXAM: CT HEAD WITHOUT CONTRAST CT CERVICAL SPINE WITHOUT CONTRAST TECHNIQUE: Multidetector CT imaging of the head and cervical spine was performed following the standard protocol without intravenous contrast. Multiplanar CT image reconstructions of the cervical spine were also generated. COMPARISON:  11/01/2019 FINDINGS: CT HEAD FINDINGS Brain: No evidence of acute infarction, hemorrhage, hydrocephalus, extra-axial collection or mass lesion/mass effect. Periventricular and deep white matter hypodensity. Vascular: No hyperdense vessel or unexpected calcification. Skull: Normal. Negative for fracture or focal lesion. Sinuses/Orbits: No acute finding. Other: None. CT CERVICAL SPINE FINDINGS Alignment: Normal. Skull base and vertebrae: No acute fracture. No primary bone lesion or focal pathologic process. Soft tissues and spinal canal: No prevertebral fluid or swelling. No visible canal hematoma. Disc levels: Moderate to severe disc space height loss and osteophytosis of the lower cervical spine, worst at C5-C6. Upper chest: Negative. Other: None. IMPRESSION: 1. No acute intracranial pathology. Small-vessel white matter disease. 2. No fracture or static subluxation of the cervical  spine. 3. Moderate to severe disc space height loss and osteophytosis of the lower cervical spine, worst at C5-C6. Electronically Signed   By: Eddie Candle M.D.   On: 12/06/2019 14:10   CT ABDOMEN PELVIS W CONTRAST  Result Date: 01/01/2020 CLINICAL DATA:  Fall and tripped, right hip pain EXAM: CT ABDOMEN AND PELVIS WITH CONTRAST TECHNIQUE: Multidetector CT imaging of the abdomen and pelvis was performed using the standard protocol following bolus administration of intravenous contrast. CONTRAST:  170mL OMNIPAQUE IOHEXOL 300 MG/ML  SOLN COMPARISON:  Radiograph same day FINDINGS: Hepatobiliary: Homogeneous hepatic attenuation without traumatic injury. No focal lesion. Gallbladder physiologically distended, no calcified stone. No biliary dilatation. Pancreas: No evidence for traumatic injury. Fatty atrophy of the pancreas is noted. No ductal dilatation or inflammation. Spleen: Homogeneous attenuation without traumatic injury. Normal in size. Adrenals/Urinary Tract: No adrenal hemorrhage. Kidneys demonstrate symmetric enhancement and excretion on delayed phase imaging. No evidence or renal injury. Ureters are well opacified proximal through mid portion. Bladder is physiologically distended without wall thickening. Stomach/Bowel: Suboptimally assessed without enteric contrast, allowing for this, no evidence of bowel injury. Stomach physiologically distended. There are no dilated or thickened small or large bowel loops. Moderate stool burden. No evidence of mesenteric hematoma. No free air free fluid. Vascular/Lymphatic: No acute vascular injury. The abdominal aorta and IVC are intact. Scattered aortic atherosclerotic calcifications are seen without aneurysmal dilatation. No evidence of retroperitoneal, abdominal, or pelvic adenopathy. Reproductive: No acute abnormality. Other: No focal contusion or abnormality of the abdominal wall. Musculoskeletal: There is a comminuted slightly impacted fracture involving the right  femoral greater tuberosity. Overlying soft tissue swelling seen along the right lateral hip. There is also nondisplaced fractures of the posterior right tenth and eleventh ribs. There appears to be age indeterminate, slight compression deformities  of the L3 and L4 vertebral bodies with less than 25% loss in height. A x stop fixation is noted at L4-L5. IMPRESSION: Comminuted impacted nondisplaced fracture of the right greater trochanter. Nondisplaced posterior right tenth and eleventh rib fractures. Age indeterminate slight superior compression deformities of the L3 and L4 vertebral bodies with less than 25% loss height. No acute intra-abdominal or pelvic injury. Aortic Atherosclerosis (ICD10-I70.0). Electronically Signed   By: Prudencio Pair M.D.   On: 01/01/2020 01:29   CT L-SPINE NO CHARGE  Result Date: 01/01/2020 CLINICAL DATA:  Fall and pain EXAM: CT LUMBAR SPINE WITHOUT CONTRAST TECHNIQUE: Multidetector CT imaging of the lumbar spine was performed without intravenous contrast administration. Multiplanar CT image reconstructions were also generated. COMPARISON:  None. FINDINGS: Segmentation: There are 5 non-rib bearing lumbar type vertebral bodies with the last intervertebral disc space labeled as L5-S1. 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 A grade 1 anterolisthesis of L4 on L5 is seen measuring 4 mm. Vertebrae: There is age indeterminate slight superior compression deformity of the L3 and L4 vertebral bodies with less than 25% loss in height. There is a posterior X stop fixation at L4-L5. Paraspinal and other soft tissues: The paraspinal soft tissues and visualized retroperitoneal structures are unremarkable. The sacroiliac joints are intact. Scattered aortic atherosclerosis is. Disc levels: Mild disc height loss with facet arthropathy seen throughout the lumbar spine. No significant canal or neural foraminal stenosis however is noted IMPRESSION: Age indeterminate slight  superior compression deformities of the L3 and L4 vertebral bodies with less than 25% loss in height. Electronically Signed   By: Prudencio Pair M.D.   On: 01/01/2020 01:35   DG Chest Portable 1 View  Result Date: 12/31/2019 CLINICAL DATA:  Fall EXAM: PORTABLE CHEST 1 VIEW COMPARISON:  06/14/2018 FINDINGS: The heart size and mediastinal contours are within normal limits. Both lungs are clear. The visualized skeletal structures are unremarkable. IMPRESSION: No active disease. Electronically Signed   By: Donavan Foil M.D.   On: 12/31/2019 22:43   DG Hip Unilat W or Wo Pelvis 2-3 Views Right  Result Date: 12/31/2019 CLINICAL DATA:  Trip and fall injury with right-sided hip pain. EXAM: DG HIP (WITH OR WITHOUT PELVIS) 2-3V RIGHT COMPARISON:  06/14/2018 mild degenerative changes in the right hip. No change in appearance since prior study. No evidence of acute fracture or dislocation. No focal bone lesion or bone destruction. Pelvis and sacrum appear intact. Degenerative changes in the lower lumbar spine. FINDINGS: Mild degenerative changes in the right hip. No change in appearance since prior study. No evidence of acute fracture or dislocation. No focal bone lesion or bone destruction. Pelvis and sacrum appear intact. Degenerative changes in the lower lumbar spine. IMPRESSION: No acute bony abnormalities. Electronically Signed   By: Lucienne Capers M.D.   On: 12/31/2019 21:07     Subjective:   Patient was seen and examined 01/03/2020, 8:47 AM Patient stable today. No acute distress.  No issues overnight Stable for discharge.  Discharge Exam:    Vitals:   01/02/20 0424 01/02/20 1323 01/02/20 1945 01/03/20 0614  BP: 118/66 109/62 91/60 105/63  Pulse: (!) 56 79 92 (!) 58  Resp: 14 12 20 14   Temp: (!) 97.4 F (36.3 C) 98 F (36.7 C) 98.8 F (37.1 C) 97.7 F (36.5 C)  TempSrc: Oral Axillary  Axillary  SpO2: 97% 99% 98% 99%  Weight:      Height:  General: Pt lying comfortably in bed &  appears in no obvious distress.AAOx1 Cardiovascular: S1 & S2 heard, RRR, S1/S2 +. No murmurs, rubs, gallops or clicks. No JVD or pedal edema. Respiratory: Clear to auscultation without wheezing, rhonchi or crackles. No increased work of breathing. Abdominal:  Non-distended, non-tender & soft. No organomegaly or masses appreciated. Normal bowel sounds heard. CNS: Alert and oriented. No focal deficits. Extremities: no edema, no cyanosis    The results of significant diagnostics from this hospitalization (including imaging, microbiology, ancillary and laboratory) are listed below for reference.      Microbiology:   Recent Results (from the past 240 hour(s))  SARS Coronavirus 2 by RT PCR (hospital order, performed in [facility] hospital lab) Nasopharyngeal Nasopharyngeal Swab     Status: None   Collection Time: 01/01/20  3:22 AM   Specimen: Nasopharyngeal Swab  Result Value Ref Range Status   SARS Coronavirus 2 NEGATIVE NEGATIVE Final    Comment: (NOTE) SARS-CoV-2 target nucleic acids are NOT DETECTED.  The SARS-CoV-2 RNA is generally detectable in upper and lower respiratory specimens during the acute phase of infection. The lowest concentration of SARS-CoV-2 viral copies this assay can detect is 250 copies / mL. A negative result does not preclude SARS-CoV-2 infection and should not be used as the sole basis for treatment or other patient management decisions.  A negative result may occur with improper specimen collection / handling, submission of specimen other than nasopharyngeal swab, presence of viral mutation(s) within the areas targeted by this assay, and inadequate number of viral copies (<250 copies / mL). A negative result must be combined with clinical observations, patient history, and epidemiological information.  Fact Sheet for Patients:   StrictlyIdeas.no  Fact Sheet for Healthcare  Providers: BankingDealers.co.za  This test is not yet approved or  cleared by the [place] FDA and has been authorized for detection and/or diagnosis of SARS-CoV-2 by FDA under an Emergency Use Authorization (EUA).  This EUA will remain in effect (meaning this test can be used) for the duration of the COVID-19 declaration under Section 564(b)(1) of the Act, 21 U.S.C. section 360bbb-3(b)(1), unless the authorization is terminated or revoked sooner.  Performed at [facility], [place] [address] [place], [place] [address]   MRSA PCR Screening     Status: None   Collection Time: 01/02/20  2:21 AM   Specimen: Nasal Mucosa; Nasopharyngeal  Result Value Ref Range Status   MRSA by PCR NEGATIVE NEGATIVE Final    Comment:        The GeneXpert MRSA Assay (FDA approved for NASAL specimens only), is one component of a comprehensive MRSA colonization surveillance program. It is not intended to diagnose MRSA infection nor to guide or monitor treatment for MRSA infections. Performed at [facility], [place] [address], [place], [place] [address]      Labs:   CBC: Recent Labs  Lab 12/31/19 0001 01/01/20 0500 01/02/20 0227 01/03/20 0308  WBC 8.2 5.9 5.4 5.4  HGB 11.0* 10.6* 10.9* 11.0*  HCT 33.1* 32.3* 34.2* 33.7*  MCV 85.8 85.4 86.6 84.9  PLT 213 185 184 568   Basic Metabolic Panel: Recent Labs  Lab 12/31/19 0001 01/01/20 0500  NA 142 137  K 3.8 3.5  CL 105 103  CO2 26 25  GLUCOSE 86 88  BUN 28* 22  CREATININE 0.67 0.59  CALCIUM 9.2 8.9   Liver Function Tests: No results for input(s): AST, ALT, ALKPHOS, BILITOT, PROT, ALBUMIN in the  last 168 hours. BNP (last 3 results) No results for input(s): BNP in the last 8760 hours. Cardiac Enzymes: No results for input(s): CKTOTAL, CKMB, CKMBINDEX, TROPONINI in the last 168 hours. CBG: Recent Labs  Lab 01/02/20 1557 01/02/20 1952 01/03/20 0003 01/03/20 0354  01/03/20 0743  GLUCAP 90 118* 118* 95 83   Hgb A1c No results for input(s): HGBA1C in the last 72 hours. Lipid Profile No results for input(s): CHOL, HDL, LDLCALC, TRIG, CHOLHDL, LDLDIRECT in the last 72 hours. Thyroid function studies No results for input(s): TSH, T4TOTAL, T3FREE, THYROIDAB in the last 72 hours.  Invalid input(s): FREET3 Anemia work up No results for input(s): VITAMINB12, FOLATE, FERRITIN, TIBC, IRON, RETICCTPCT in the last 72 hours. Urinalysis    Component Value Date/Time   COLORURINE STRAW (A) 01/01/2020 0202   APPEARANCEUR CLEAR 01/01/2020 0202   LABSPEC >1.046 (H) 01/01/2020 0202   PHURINE 7.0 01/01/2020 0202   GLUCOSEU NEGATIVE 01/01/2020 0202   HGBUR SMALL (A) 01/01/2020 0202   BILIRUBINUR NEGATIVE 01/01/2020 0202   BILIRUBINUR negative 06/10/2012 1631   KETONESUR NEGATIVE 01/01/2020 0202   PROTEINUR NEGATIVE 01/01/2020 0202   UROBILINOGEN negative 06/10/2012 1631   NITRITE NEGATIVE 01/01/2020 0202   LEUKOCYTESUR NEGATIVE 01/01/2020 0202         Time coordinating discharge: Over 45 minutes  SIGNED: Deatra James, MD, FACP, FHM. [facility],  Please use [email or web address] to Page If 7PM-7AM, please contact night-coverage [email or web address] [email or web address] [facility] 01/03/2020, 8:47 AM

## 2020-01-03 NOTE — TOC Initial Note (Addendum)
Transition of Care [facility]) - Initial/Assessment Note    Patient Details  Name: Stacy Davis MRN: [id number] Date of Birth: [birth year]  [facility]) CM/SW Contact:    Lia Hopping, [place] Phone Number: 01/03/2020, 9:56 AM  Clinical Narrative:                 CSW reached out to the facility. Patient lives at [facility]. CSW reached out to the resident director Melissa. The facility does not provide SNF level of care. The patient requires total assist for mobility. The patient was ambulatory without a walker. Per Director, the patient was a pacer, always walking.  The patient is assisted with bathes, dressing, toileting  and can feed herself finger foods only. CSW reached out to the patient son Merry Proud.  Patient son informed of PT recommendations for SNF rehab. Son reports the patient has not been to a SNF for rehab. CSW explain SNF process and insurance required authorization. Patient son report understanding. CSW will follow up with bed offers.    Expected Discharge Plan: Skilled Nursing Facility  Barriers to Discharge: Insurance Authorization   Patient Goals and CMS Choice   CMS Medicare.gov Compare Post Acute Care list provided to:: Other (Comment Required) (Son) Choice offered to / list presented to : Adult Children  Expected Discharge Plan and Services Expected Discharge Plan: [place] Choice: [place] arrangements for the past 2 months: [place] (Memory Care) Expected Discharge Date: 01/03/20               DME Arranged: N/A DME Agency: NA       HH Arranged: NA HH Agency: NA        Prior Living Arrangements/Services Living arrangements for the past 2 months: [place] (Memory Care) Lives with:: Facility Resident Patient language and need for interpreter reviewed:: No Do you feel safe going back to the place where you  live?: Yes      Need for Family Participation in Patient Care: Yes (Comment) Care giver support system in place?: Yes (comment)   Criminal Activity/Legal Involvement Pertinent to Current Situation/Hospitalization: No - Comment as needed  Activities of Daily Living Home Assistive Devices/Equipment: None ADL Screening (condition at time of admission) Patient's cognitive ability adequate to safely complete daily activities?: No Is the patient deaf or have difficulty hearing?: No Does the patient have difficulty seeing, even when wearing glasses/contacts?: No Does the patient have difficulty concentrating, remembering, or making decisions?: Yes Patient able to express need for assistance with ADLs?: Yes Does the patient have difficulty dressing or bathing?: Yes Independently performs ADLs?: No Communication: Independent Is this a change from baseline?: Pre-admission baseline Dressing (OT): Needs assistance Is this a change from baseline?: Pre-admission baseline Grooming: Needs assistance Is this a change from baseline?: Pre-admission baseline Feeding: Needs assistance Is this a change from baseline?: Pre-admission baseline Bathing: Needs assistance Is this a change from baseline?: Pre-admission baseline Toileting: Needs assistance Is this a change from baseline?: Pre-admission baseline In/Out Bed: Needs assistance Is this a change from baseline?: Pre-admission baseline Walks in Home: Needs assistance Is this a change from baseline?: Pre-admission baseline Does the patient have difficulty walking or climbing stairs?: Yes Weakness of Legs: Both Weakness of Arms/Hands: Both  Permission Sought/Granted Permission sought to share information with : Family Supports Permission granted to share information with : Yes, Release of Information Signed  Share Information with NAME: Gissell Barra  Permission granted to share info w AGENCY: SNF's in the area  Permission granted to share info w  Relationship: Son  Permission granted to share info w Contact Information: [phone]  Emotional Assessment Appearance:: Appears stated age Attitude/Demeanor/Rapport: Unable to Assess Affect (typically observed): Unable to Assess Orientation: : Oriented to Self Alcohol / Substance Use: Not Applicable Psych Involvement: No (comment)  Admission diagnosis:  Hip fx ([place]) [S72.009A] Fall [W19.XXXA] Trochanteric fracture ([place]) [S72.109A] Closed fracture of right hip, initial encounter ([place]) [S72.001A] Fall at nursing home, initial encounter [W19.XXXA, Y92.129] Closed fracture of multiple ribs of right side, initial encounter [S22.41XA] Patient Active Problem List   Diagnosis Date Noted  . Trochanteric fracture ([place]) 01/01/2020  . Closed rib fracture 01/01/2020  . Anemia 01/01/2020  . Hip fx ([place]) 01/01/2020  . Orthostatic dizziness   . Dementia ([place])   . Seizures ([place]) 06/16/2018  . Subarachnoid hemorrhage ([place])   . Syncope 04/11/2018  . Incontinence of feces 01/08/2018  . Depression 10/08/2017  . Memory impairment   . Advance care planning 10/06/2014  . Joint pain 04/15/2013  . Advance directive on file 08/12/2012  . Confusion 06/10/2012  . Right rotator cuff tear 05/15/2012  . Shoulder pain 05/03/2012  . Insomnia 08/13/2011  . 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 HTN (hypertension) 07/18/2011  . HLD (hyperlipidemia) 07/18/2011  . Memory loss 07/18/2011  . Hypothyroid 07/18/2011  . SVT (supraventricular tachycardia) ([place]) 07/18/2011  . Von Willebrand disease ([place]) 07/18/2011  . GERD (gastroesophageal reflux disease) 07/18/2011  . Back pain 07/18/2011  . Fatty liver 07/18/2011   PCP:  Susy Frizzle, MD Pharmacy:   [facility], [place] [address] Phone: [phone] Fax: [phone]  [facility] [place], [place] - [address] [place] [address] Phone: [phone] Fax: [phone]     Social Determinants of Health (SDOH) Interventions    Readmission Risk Interventions No flowsheet data found.

## 2020-01-03 NOTE — Progress Notes (Signed)
Current Palliative:  Manufacturing engineer (ACC) Community Based Palliative Care        This patient is enrolled in our palliative care services in the community.  [facility] will continue to follow for any discharge planning needs and to coordinate continuation of palliative care.    If you have questions or need assistance, please call [phone] or contact the hospital Liaison listed on AMION.        Domenic Moras, BSN, RN [facility] Liaison   [phone] (24h on call)

## 2020-01-03 NOTE — Progress Notes (Signed)
  Transition of Care (TOC) -Dementia Primary Diagnosis Note       Patient Details  Name: Stacy Davis. Silber IXM:[id number] Date of Birth: 1946/03/30   Transition of Care [facility]) CM/SW Contact  Name: Kathrin Greathouse Phone Number:[phone] Date: 01/03/2020 Time: 1:03pm   MUST ID:    To Whom it May Concern:   Please be advised that the above-named patient has a primary diagnosis of dementia which supersedes any psychiatric diagnosis.

## 2020-01-03 NOTE — Plan of Care (Signed)
  Problem: Education: Goal: Verbalization of understanding the information provided (i.e., activity precautions, restrictions, etc) will improve Outcome: Progressing Goal: Individualized Educational Video(s) Outcome: Progressing   Problem: Activity: Goal: Ability to ambulate and perform ADLs will improve Outcome: Progressing   Problem: Self-Concept: Goal: Ability to maintain and perform role responsibilities to the fullest extent possible will improve Outcome: Progressing   Problem: Pain Management: Goal: Pain level will decrease Outcome: Progressing   Problem: Education: Goal: Knowledge of General Education information will improve Description: Including pain rating scale, medication(s)/side effects and non-pharmacologic comfort measures Outcome: Progressing   Problem: Health Behavior/Discharge Planning: Goal: Ability to manage health-related needs will improve Outcome: Progressing   Problem: Clinical Measurements: Goal: Ability to maintain clinical measurements within normal limits will improve Outcome: Progressing Goal: Will remain free from infection Outcome: Progressing Goal: Diagnostic test results will improve Outcome: Progressing   Problem: Activity: Goal: Risk for activity intolerance will decrease Outcome: Progressing   Problem: Nutrition: Goal: Adequate nutrition will be maintained Outcome: Progressing   Problem: Coping: Goal: Level of anxiety will decrease Outcome: Progressing   Problem: Pain Managment: Goal: General experience of comfort will improve Outcome: Progressing   Problem: Safety: Goal: Ability to remain free from injury will improve Outcome: Progressing   Problem: Skin Integrity: Goal: Risk for impaired skin integrity will decrease Outcome: Progressing

## 2020-01-03 NOTE — NC FL2 (Signed)
[place] LEVEL OF CARE SCREENING TOOL     IDENTIFICATION  Patient Name: Stacy Davis Birthdate: [birth year] Sex: female Admission Date (Current Location): 12/31/2019  [facility] and [place] Number:  Herbalist and Address:  [facility],  [place] [address], [place]      Provider Number: [id number]  Attending Physician Name and Address:  Deatra James, MD  Relative Name and Phone Number:  [place], [place]   [phone]    Current Level of Care: Hospital Recommended Level of Care: [place] Prior Approval Number:    Date Approved/Denied:   PASRR Number:    Discharge Plan: SNF    Current Diagnoses: Patient Active Problem List   Diagnosis Date Noted  . Trochanteric fracture ([place]) 01/01/2020  . Closed rib fracture 01/01/2020  . Anemia 01/01/2020  . Hip fx ([place]) 01/01/2020  . Orthostatic dizziness   . Dementia ([place])   . Seizures ([place]) 06/16/2018  . Subarachnoid hemorrhage ([place])   . Syncope 04/11/2018  . Incontinence of feces 01/08/2018  . Depression 10/08/2017  . Memory impairment   . Advance care planning 10/06/2014  . Joint pain 04/15/2013  . Advance directive on file 08/12/2012  . Confusion 06/10/2012  . Right rotator cuff tear 05/15/2012  . Shoulder pain 05/03/2012  . Insomnia 08/13/2011  . 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 HTN (hypertension) 07/18/2011  . HLD (hyperlipidemia) 07/18/2011  . Memory loss 07/18/2011  . Hypothyroid 07/18/2011  . SVT (supraventricular tachycardia) ([place]) 07/18/2011  . Von Willebrand disease ([place]) 07/18/2011  . GERD (gastroesophageal reflux disease) 07/18/2011  . Back pain 07/18/2011  . Fatty liver 07/18/2011    Orientation RESPIRATION BLADDER Height & Weight     Self  Normal Incontinent Weight: 116 lb 6.5 oz (52.8 kg) Height:  5\' 3"  (160 cm)  BEHAVIORAL SYMPTOMS/MOOD NEUROLOGICAL BOWEL NUTRITION STATUS       Incontinent Diet  AMBULATORY STATUS COMMUNICATION OF NEEDS Skin   Extensive Assist Verbally Normal                       Personal Care Assistance Level of Assistance  Bathing, Feeding, Dressing Bathing Assistance: Maximum assistance Feeding assistance: Maximum assistance Dressing Assistance: Maximum assistance     Functional Limitations Info  Sight, Hearing, Speech Sight Info: Impaired Hearing Info: Adequate Speech Info: Adequate    SPECIAL CARE FACTORS FREQUENCY  PT (By licensed PT), OT (By licensed OT)     PT Frequency: 5x/week OT Frequency: 5x/week            Contractures Contractures Info: Not present    Additional Factors Info  Code Status, Allergies, Psychotropic Code Status Info: Fullcode Allergies Info: Allergies: Tramadol Psychotropic Info: Seroquel, Zoloft, Keppra         Current Medications (01/03/2020):  This is the current hospital active medication list Current Facility-Administered Medications  Medication Dose Route Frequency Provider Last Rate Last Admin  . acetaminophen (TYLENOL) tablet 1,000 mg  1,000 mg Oral Q12H PRN Shahmehdi, Seyed A, MD   1,000 mg at 01/03/20 0800  . ketorolac (TORADOL) tablet 10 mg  10 mg Oral Q6H PRN Shahmehdi, Seyed A, MD      . levETIRAcetam (KEPPRA XR) 24 hr tablet 500 mg  500 mg Oral Daily Shade, Christine E, RPH   500 mg at 01/03/20 0811   And  . levETIRAcetam (KEPPRA XR) 24 hr tablet 1,000 mg  1,000 mg  Oral QHS Randa Spike, RPH   1,000 mg at 01/02/20 2110  . levothyroxine (SYNTHROID) tablet 88 mcg  88 mcg Oral Daily Rise Patience, MD   88 mcg at 01/03/20 0530  . lidocaine (LIDODERM) 5 % 1 patch  1 patch Transdermal Q24H Shahmehdi, Seyed A, MD   1 patch at 01/02/20 1141  . LORazepam (ATIVAN) tablet 0.5 mg  0.5 mg Oral BID PRN Rise Patience, MD      . melatonin tablet 3 mg  3 mg Oral QHS Rise Patience, MD   3 mg at 01/02/20 2109  . ondansetron (ZOFRAN) tablet 4 mg  4 mg Oral Q6H PRN  Rise Patience, MD       Or  . ondansetron [facility]) injection 4 mg  4 mg Intravenous Q6H PRN Rise Patience, MD      . pantoprazole (PROTONIX) EC tablet 40 mg  40 mg Oral Daily Rise Patience, MD   40 mg at 01/03/20 0800  . QUEtiapine (SEROQUEL) tablet 25 mg  25 mg Oral QHS Rise Patience, MD   25 mg at 01/02/20 2109  . sertraline (ZOLOFT) tablet 50 mg  50 mg Oral Daily Rise Patience, MD   50 mg at 01/03/20 0800     Discharge Medications: Please see discharge summary for a list of discharge medications.  Relevant Imaging Results:  Relevant Lab Results:   Additional Information ssn: [id number]  Lia Hopping, LCSW

## 2020-01-04 DIAGNOSIS — Z7189 Other specified counseling: Secondary | ICD-10-CM

## 2020-01-04 LAB — CBC
HCT: 33.8 % — ABNORMAL LOW (ref 36.0–46.0)
Hemoglobin: 11 g/dL — ABNORMAL LOW (ref 12.0–15.0)
MCH: 28.1 pg (ref 26.0–34.0)
MCHC: 32.5 g/dL (ref 30.0–36.0)
MCV: 86.2 fL (ref 80.0–100.0)
Platelets: 180 10*3/uL (ref 150–400)
RBC: 3.92 MIL/uL (ref 3.87–5.11)
RDW: 13.8 % (ref 11.5–15.5)
WBC: 5.2 10*3/uL (ref 4.0–10.5)
nRBC: 0 % (ref 0.0–0.2)

## 2020-01-04 LAB — GLUCOSE, CAPILLARY
Glucose-Capillary: 125 mg/dL — ABNORMAL HIGH (ref 70–99)
Glucose-Capillary: 127 mg/dL — ABNORMAL HIGH (ref 70–99)
Glucose-Capillary: 140 mg/dL — ABNORMAL HIGH (ref 70–99)
Glucose-Capillary: 70 mg/dL (ref 70–99)
Glucose-Capillary: 90 mg/dL (ref 70–99)
Glucose-Capillary: 93 mg/dL (ref 70–99)

## 2020-01-04 MED ORDER — OMEPRAZOLE 20 MG PO CPDR: 20.0000 mg | DELAYED_RELEASE_CAPSULE | Freq: Two times a day (BID) | ORAL | 1 refills | Status: DC

## 2020-01-04 MED ORDER — ASPIRIN EC 325 MG PO TBEC: 325.0000 mg | DELAYED_RELEASE_TABLET | Freq: Two times a day (BID) | ORAL | 3 refills | Status: DC

## 2020-01-04 MED ORDER — DEXTROSE-NACL 5-0.45 % IV SOLN: INTRAVENOUS | Status: DC

## 2020-01-04 NOTE — Care Management Important Message (Signed)
Important Message  Patient Details IM Letter given to the Patient Name: Stacy Davis MRN: [id number] Date of Birth: [birth year]   Medicare Important Message Given:  Yes     Kerin Salen 01/04/2020, 9:41 AM

## 2020-01-04 NOTE — Progress Notes (Signed)
Physical Therapy Treatment Patient Details Name: Stacy Davis MRN: [id number] DOB: 10-Dec-1945 Today's Date: 01/04/2020    History of Present Illness Pt s/p fall with rib 10 and 11 fx and R greater trochanter fx - non-operative at this time.  Pt with hx of recent fall with R wrist fx - above elbow splint still in place.  Pt with hx of sz, dementia, DM, lumbar laminectomy, and L3,4 compression fx.    PT Comments    Pt is unable to follow commands. Total assist for supine to sit. Min to mod assist for maintaining sitting balance at edge of bed 2* posterior lean. Performed PROM to R hip. Mechanical lift recommended for transfers.    Follow Up Recommendations  SNF     Equipment Recommendations  None recommended by PT    Recommendations for Other Services       Precautions / Restrictions Precautions Precautions: Fall Restrictions Weight Bearing Restrictions: Yes Other Position/Activity Restrictions: 25% PWB R LE, R UE in above Elbow splint    Mobility  Bed Mobility Overal bed mobility: Needs Assistance Bed Mobility: Supine to Sit;Sit to Supine Rolling: Total assist;+2 for safety/equipment;+2 for physical assistance   Supine to sit: Total assist Sit to supine: Total assist   General bed mobility comments: Pt following no cues to move supine<>sit. Posterior lean in sitting requiring mod to min A at all times  Transfers                 General transfer comment: NT 2* Pt inability to follow cues, poor sitting balance and 25% PWB status. +2 assist not available. Lift recommended for transfers.  Ambulation/Gait                 Stairs             Wheelchair Mobility    Modified Rankin (Stroke Patients Only)       Balance Overall balance assessment: Needs assistance Sitting-balance support: Feet supported;Single extremity supported Sitting balance-Leahy Scale: Poor Sitting balance - Comments: min to mod A 2* posterior lean. Pt sat EOB x 4  minutes. Did not respond to commands to lean or reach forward. Postural control: Posterior lean                                  Cognition Arousal/Alertness: Lethargic Behavior During Therapy: Flat affect Overall Cognitive Status: History of cognitive impairments - at baseline                                 General Comments: pt has dementia, she's not able to follow commands nor answer questions      Exercises General Exercises - Lower Extremity Heel Slides: PROM;Right;10 reps;Supine    General Comments        Pertinent Vitals/Pain Pain Assessment: Faces Faces Pain Scale: Hurts little more (pt shook her head for "no" when asked if in pain) Pain Location: R hip Pain Descriptors / Indicators: Grimacing Pain Intervention(s): Limited activity within patient's tolerance;Monitored during session;Repositioned;Premedicated before session    Home Living                      Prior Function            PT Goals (current goals can now be found in the care plan section) Acute Rehab PT Goals Patient  Stated Goal: No goals stated PT Goal Formulation: Patient unable to participate in goal setting Time For Goal Achievement: 01/16/20 Potential to Achieve Goals: Poor Progress towards PT goals: Not progressing toward goals - comment (unable to follow commands)    Frequency    Min 2X/week      PT Plan Current plan remains appropriate    Co-evaluation              AM-PAC PT "6 Clicks" Mobility   Outcome Measure  Help needed turning from your back to your side while in a flat bed without using bedrails?: Total Help needed moving from lying on your back to sitting on the side of a flat bed without using bedrails?: Total Help needed moving to and from a bed to a chair (including a wheelchair)?: Total Help needed standing up from a chair using your arms (e.g., wheelchair or bedside chair)?: Total Help needed to walk in hospital room?:  Total Help needed climbing 3-5 steps with a railing? : Total 6 Click Score: 6    End of Session Equipment Utilized During Treatment: Gait belt Activity Tolerance: Patient limited by fatigue Patient left: in bed;with call bell/phone within reach;with bed alarm set Nurse Communication: Mobility status;Need for lift equipment PT Visit Diagnosis: Difficulty in walking, not elsewhere classified (R26.2);Pain Pain - Right/Left: Right Pain - part of body: Hip     Time: 4388-8757 PT Time Calculation (min) (ACUTE ONLY): 8 min  Charges:  $Therapeutic Activity: 8-22 mins                     Blondell Reveal Kistler PT 01/04/2020  Acute Rehabilitation Services Pager [phone] Office [phone]

## 2020-01-04 NOTE — TOC Progression Note (Addendum)
Transition of Care [facility]) - Progression Note    Patient Details  Name: [name] MRN: [id number] Date of Birth: [birth year]  Transition of Care [facility]) CM/SW [place], [place] Phone Number:  01/04/2020, 8:44 AM  Clinical Narrative:    HTA. Offered a Peer to Peer with physician.  Peer to Peer complete. PT evaluation completed, waiting for HTA decision.  CSW reached out to Stacy patient son Stacy provided an update. CSW reached out to [facility] facility [facility] provided an update to [facility] resident director Melissa. Requested paperwork sent.   Patient was approved for EMS transport for 3 months auth# [id number].  Expected Discharge Plan: Skilled Nursing Facility Barriers to Discharge: Ship broker, [place] Forensic scientist)  Expected Discharge Plan [facility] Expected Discharge Plan: [place] Choice: [place] Living arrangements for Stacy past 2 months: [place] (Memory Care) Expected Discharge Date: 01/03/20               DME Arranged: N/A DME Agency: NA       HH Arranged: NA HH Agency: NA         Social Determinants of Health (SDOH) Interventions    Readmission Risk Interventions No flowsheet data found.

## 2020-01-04 NOTE — Plan of Care (Signed)
  Problem: Education: Goal: Verbalization of understanding the information provided (i.e., activity precautions, restrictions, etc) will improve Outcome: Progressing   Problem: Activity: Goal: Ability to ambulate and perform ADLs will improve Outcome: Progressing   Problem: Self-Concept: Goal: Ability to maintain and perform role responsibilities to the fullest extent possible will improve Outcome: Progressing   Problem: Pain Management: Goal: Pain level will decrease Outcome: Progressing   Problem: Education: Goal: Knowledge of General Education information will improve Description: Including pain rating scale, medication(s)/side effects and non-pharmacologic comfort measures Outcome: Progressing   Problem: Health Behavior/Discharge Planning: Goal: Ability to manage health-related needs will improve Outcome: Progressing

## 2020-01-04 NOTE — Progress Notes (Signed)
Physician Discharge Summary [facility]    Patient: Stacy Davis                   Admit date: 12/31/2019   DOB: [birth year]             Discharge date:01/04/2020/10:33 AM [id number]                          PCP: Susy Frizzle, MD  Disposition: SNF  Recommendations for Outpatient Follow-up:    Follow up: in 1 week ortho   Discharge Condition: Stable   Code Status:   Code Status: Full Code  Diet recommendation: Regular healthy diet    Patient was seen and examined this morning remained stable. Was discharged yesterday-have advanced dementia Pending approval to return to SNF  Discharge Diagnoses:    Active Problems:   HTN (hypertension)   Memory loss   Hypothyroid   Von Willebrand disease ([place])   Dementia ([place])   Trochanteric fracture ([place])   Closed rib fracture   Anemia   Hip fx ([place])   Palliative care by specialist   Goals of care, counseling/discussion   General weakness   History of Present Illness/ Hospital Course Stacy Davis Summary:   Stacy Davis a 74 y.o.femalewithhistory of advanced dementia, hypothyroidism, seizures, depression and hypothyroidism was brought to the ER after patient had a fall while walking and tripped on the walker. Patient was complaining of right hip pain was brought to the ER.  ED: Patient had CT head C-spine and CT abdomen and pelvis.  CT scan showed right-sided comminuted impacted nondisplaced fracture of the right greater trochanter and nondisplaced fracture of the right 10th and 11th rib.  There was also an age-indeterminate compression fracture of L3 and L4. Lab work was significant for hemoglobin of 11/ Covid test was negative.   Orthopedic surgeon Dr. Veverly Fells consulted.   Recommend no surgical intervention, continue aggressive PT for mobilization, minimal weightbearing to the right to 25%, follow-up in office     Hospital course:    Right hip fracture: -Hemodynamically  stable, -Continue pain management -Status post Ortho evaluation-recommended no surgical intervention, Recommending mobilization minimal weightbearing on the right hip 25%, Outpatient follow-up -Patient has been n.p.o., and was able diet as tolerated  Right-sided commuted impacted nondisplaced fracture of the right greater trochanter and also fractures of the right 10th and 11th rib and age-indeterminate fracture of the lumbar L3-L4 spine after fall for which Dr. Veverly Fells orthopedic surgeon has been consulted.   -Standing from aggressive pain pain medication due to sedative, somnolent, and altered mental status.  Currently high-dose NSAIDs, alternated with Tylenol recommended.  -Status post evaluation by orthopedic team recommending no surgical intervention -continue aggressive PT for mobilization with minimal WB on her right hip 25%-follow-up with orthopedic team in office.    Hypothyroidism on Synthroid.  History of seizures on Keppra which have dosed as IV for now.  Anemia appears to be chronic follow CBC.  Advance Dementia and depression on Seroquel and Zoloft.  History of von Willebrand disease ... -Heme-onc Dr. Alen Blew consulted -after thorough investigation recommended no hold back as far as surgical intervention, no need for treatment, as of 01/01/2020 PT, PTT normal, normal platelets,  Per Dr. Tami Lin: "history of presumed von Willebrand's disease. Reviewing laboratory data in the last 11 years I see no clear-cut evidence to suggest she has this condition. Her von Willebrand's disease panel obtained in September 2010 was  normal. She has had multiple surgical procedures since that time without any postoperative complications."   ---------------------------------------------------------------------------------------------------------------------------- DVT prophylaxis:  Continue  SCD/Compression stockings -aspirin Code Status:   Code Status: Full Code -palliative care was  consulted-recommending continue palliative care at SNF Family Communication: No family member present at bedside- attempt Was made to call his son POA Mr. Leathie Weich at [phone] message was left   -Advance care planning has been discussed.   Admission status:   Status is: Observation  The patient remains OBS appropriate and will d/c before 2 midnights.  Dispo: The patient is from: SNF  Anticipated d/c is to: SNF   Nutritional status:          Discharge Instructions:   Discharge Instructions    Activity as tolerated - No restrictions   Complete by: As directed    Diet - low sodium heart healthy   Complete by: As directed    Discharge instructions   Complete by: As directed    Fall precaution.  Follow-up with orthopedic team as scheduled.  PT OT per orthopedic recommendations   Increase activity slowly   Complete by: As directed        Medication List    STOP taking these medications   traMADol 50 MG tablet Commonly known as: ULTRAM     TAKE these medications   aspirin EC 325 MG tablet Take 1 tablet (325 mg total) by mouth daily.   ketorolac 10 MG tablet Commonly known as: TORADOL Take 1 tablet (10 mg total) by mouth every 6 (six) hours as needed for moderate pain or severe pain.   levETIRAcetam 500 MG 24 hr tablet Commonly known as: Keppra XR Take 2 tablets (1,000 mg total) by mouth at bedtime AND 1 tablet (500 mg total) in the morning.   levothyroxine 88 MCG tablet Commonly known as: SYNTHROID TAKE 1 TABLET BY MOUTH ONCE A DAY   LORazepam 0.5 MG tablet Commonly known as: ATIVAN Take 0.5 mg by mouth 2 (two) times daily as needed for anxiety.   melatonin 3 MG Tabs tablet Take 3 mg by mouth at bedtime.   omeprazole 40 MG capsule Commonly known as: PRILOSEC TAKE 1 CAPSULE BY MOUTH ONCE DAILY   QUEtiapine 25 MG tablet Commonly known as: SEROquel Take 1 tablet (25 mg total) by mouth at bedtime.   sertraline 50 MG  tablet Commonly known as: ZOLOFT Take 1 tablet (50 mg total) by mouth daily.       Allergies  Allergen Reactions   Tramadol Other (See Comments)    CHANGES IN MEMORY     Procedures /Studies:   DG Wrist Complete Right  Result Date: 12/24/2019 CLINICAL DATA:  Fall, pain EXAM: RIGHT WRIST - COMPLETE 3+ VIEW COMPARISON:  November 01, 2019 FINDINGS: There is a transverse acute fracture of the distal right radius with dorsal angulation. No substantial displacement identified. No definite radiocarpal extension. IMPRESSION: Acute transverse fracture of the distal right radius with dorsal angulation. Electronically Signed   By: Macy Mis M.D.   On: 12/24/2019 13:23   CT Head Wo Contrast  Result Date: 12/31/2019 CLINICAL DATA:  Fall EXAM: CT HEAD WITHOUT CONTRAST TECHNIQUE: Contiguous axial images were obtained from the base of the skull through the vertex without intravenous contrast. COMPARISON:  December 24, 2019 FINDINGS: Brain: No evidence of acute territorial infarction, hemorrhage, hydrocephalus,extra-axial collection or mass lesion/mass effect. There is dilatation the ventricles and sulci consistent with age-related atrophy. Low-attenuation changes in the deep white matter  consistent with small vessel ischemia. Vascular: No hyperdense vessel or unexpected calcification. Skull: The skull is intact. No fracture or focal lesion identified. Sinuses/Orbits: The visualized paranasal sinuses and mastoid air cells are clear. The orbits and globes intact. Other: None Cervical spine: Somewhat limited due to patient motion. Alignment: There is a minimal anterolisthesis of C3 on C4 measuring 2 mm. Skull base and vertebrae: Visualized skull base is intact. No atlanto-occipital dissociation. The vertebral body heights are well maintained. No fracture or pathologic osseous lesion seen. Soft tissues and spinal canal: The visualized paraspinal soft tissues are unremarkable. No prevertebral soft tissue swelling is  seen. The spinal canal is grossly unremarkable, no large epidural collection or significant canal narrowing. Disc levels: Multilevel cervical spine spondylosis is seen with anterior osteophytes disc osteophyte complex and uncovertebral osteophytes most notable at C5-C6 with severe neural foraminal narrowing and mild to moderate central canal stenosis. Upper chest: The lung apices are clear. Thoracic inlet is within normal limits. Other: None IMPRESSION: No acute intracranial abnormality. Findings consistent with age related atrophy and chronic small vessel ischemia No acute fracture or malalignment of the spine. Cervical spine spondylosis most notable at C5-C6. Electronically Signed   By: Prudencio Pair M.D.   On: 12/31/2019 21:22   CT Head Wo Contrast  Result Date: 12/24/2019 CLINICAL DATA:  Head trauma, minor. Unwitnessed fall, neck pain, dementia. EXAM: CT HEAD WITHOUT CONTRAST CT CERVICAL SPINE WITHOUT CONTRAST TECHNIQUE: Multidetector CT imaging of the head and cervical spine was performed following the standard protocol without intravenous contrast. Multiplanar CT image reconstructions of the cervical spine were also generated. COMPARISON:  Head CT 12/10/2019, CT cervical spine 12/10/2019. FINDINGS: CT HEAD FINDINGS Brain: Stable mild generalized parenchymal atrophy and chronic small vessel ischemic disease. There is no acute intracranial hemorrhage. No demarcated cortical infarct. No extra-axial fluid collection. No evidence of intracranial mass. No midline shift. Vascular: No hyperdense vessel. Skull: Normal. Negative for fracture or focal lesion. Sinuses/Orbits: Visualized orbits show no acute finding. Mild ethmoid sinus mucosal thickening. No significant mastoid effusion. CT CERVICAL SPINE FINDINGS Alignment: Straightening of the expected cervical lordosis. Unchanged 2 mm C3-C4 grade 1 anterolisthesis. Skull base and vertebrae: The basion-dental and atlanto-dental intervals are maintained.No evidence of  acute fracture to the cervical spine. Soft tissues and spinal canal: No prevertebral fluid or swelling. No visible canal hematoma. Disc levels: Cervical spondylosis with multilevel disc space narrowing, posterior disc osteophytes, uncovertebral and facet hypertrophy. Disc space narrowing is severe at the C4-C5 through C7-T1 levels. Degenerative fusion across the C4-C5 and C5-C6 disc spaces. Additionally, there is degenerative fusion across the facet joints bilaterally at C4-C5. A posterior disc osteophyte complex contributes to mild/moderate bony spinal canal stenosis at C5-C6. Multilevel bony neural foraminal narrowing. Upper chest: No consolidation within the imaged lung apices. No visible pneumothorax. IMPRESSION: CT head: 1. No evidence of acute intracranial abnormality. 2. Stable mild generalized parenchymal atrophy and chronic small vessel ischemic disease. 3. Mild ethmoid sinus mucosal thickening. CT cervical spine: 1. No evidence of acute fracture to the cervical spine. 2. Unchanged 2 mm C3-C4 grade 1 anterolisthesis. 3. Advanced cervical spondylosis as outlined. Electronically Signed   By: Kellie Simmering DO   On: 12/24/2019 13:11   CT Head Wo Contrast  Result Date: 12/10/2019 CLINICAL DATA:  Posttraumatic headache after witnessed fall. EXAM: CT HEAD WITHOUT CONTRAST CT CERVICAL SPINE WITHOUT CONTRAST TECHNIQUE: Multidetector CT imaging of the head and cervical spine was performed following the standard protocol without intravenous contrast. Multiplanar CT  image reconstructions of the cervical spine were also generated. COMPARISON:  December 06, 2019. FINDINGS: CT HEAD FINDINGS Brain: Mild diffuse cortical atrophy is noted. Mild chronic ischemic white matter disease is noted. No mass effect or midline shift is noted. Ventricular size is within normal limits. There is no evidence of mass lesion, hemorrhage or acute infarction. Vascular: No hyperdense vessel or unexpected calcification. Skull: Normal. Negative  for fracture or focal lesion. Sinuses/Orbits: No acute finding. Other: None. CT CERVICAL SPINE FINDINGS Alignment: Mild grade 1 anterolisthesis of C3-4 is noted secondary to posterior facet joint hypertrophy. Skull base and vertebrae: No acute fracture. No primary bone lesion or focal pathologic process. Soft tissues and spinal canal: No prevertebral fluid or swelling. No visible canal hematoma. Disc levels: Severe degenerative disc disease is noted at C4-5, C5-6, C6-7 and C7-T1. Upper chest: Negative. Other: Degenerative changes are seen involving posterior facet joints bilaterally. IMPRESSION: 1. Mild diffuse cortical atrophy. Mild chronic ischemic white matter disease. No acute intracranial abnormality seen. 2. Severe multilevel degenerative disc disease. No acute abnormality seen in the cervical spine. Electronically Signed   By: Marijo Conception M.D.   On: 12/10/2019 18:34   CT Head Wo Contrast  Result Date: 12/06/2019 CLINICAL DATA:  Unwitnessed fall, possible seizure, history of dementia EXAM: CT HEAD WITHOUT CONTRAST CT CERVICAL SPINE WITHOUT CONTRAST TECHNIQUE: Multidetector CT imaging of the head and cervical spine was performed following the standard protocol without intravenous contrast. Multiplanar CT image reconstructions of the cervical spine were also generated. COMPARISON:  11/01/2019 FINDINGS: CT HEAD FINDINGS Brain: No evidence of acute infarction, hemorrhage, hydrocephalus, extra-axial collection or mass lesion/mass effect. Periventricular and deep white matter hypodensity. Vascular: No hyperdense vessel or unexpected calcification. Skull: Normal. Negative for fracture or focal lesion. Sinuses/Orbits: No acute finding. Other: None. CT CERVICAL SPINE FINDINGS Alignment: Normal. Skull base and vertebrae: No acute fracture. No primary bone lesion or focal pathologic process. Soft tissues and spinal canal: No prevertebral fluid or swelling. No visible canal hematoma. Disc levels: Moderate to  severe disc space height loss and osteophytosis of the lower cervical spine, worst at C5-C6. Upper chest: Negative. Other: None. IMPRESSION: 1. No acute intracranial pathology. Small-vessel white matter disease. 2. No fracture or static subluxation of the cervical spine. 3. Moderate to severe disc space height loss and osteophytosis of the lower cervical spine, worst at C5-C6. Electronically Signed   By: Eddie Candle M.D.   On: 12/06/2019 14:10   CT Cervical Spine Wo Contrast  Result Date: 12/31/2019 CLINICAL DATA:  Fall EXAM: CT HEAD WITHOUT CONTRAST TECHNIQUE: Contiguous axial images were obtained from the base of the skull through the vertex without intravenous contrast. COMPARISON:  December 24, 2019 FINDINGS: Brain: No evidence of acute territorial infarction, hemorrhage, hydrocephalus,extra-axial collection or mass lesion/mass effect. There is dilatation the ventricles and sulci consistent with age-related atrophy. Low-attenuation changes in the deep white matter consistent with small vessel ischemia. Vascular: No hyperdense vessel or unexpected calcification. Skull: The skull is intact. No fracture or focal lesion identified. Sinuses/Orbits: The visualized paranasal sinuses and mastoid air cells are clear. The orbits and globes intact. Other: None Cervical spine: Somewhat limited due to patient motion. Alignment: There is a minimal anterolisthesis of C3 on C4 measuring 2 mm. Skull base and vertebrae: Visualized skull base is intact. No atlanto-occipital dissociation. The vertebral body heights are well maintained. No fracture or pathologic osseous lesion seen. Soft tissues and spinal canal: The visualized paraspinal soft tissues are unremarkable. No prevertebral soft  tissue swelling is seen. The spinal canal is grossly unremarkable, no large epidural collection or significant canal narrowing. Disc levels: Multilevel cervical spine spondylosis is seen with anterior osteophytes disc osteophyte complex and  uncovertebral osteophytes most notable at C5-C6 with severe neural foraminal narrowing and mild to moderate central canal stenosis. Upper chest: The lung apices are clear. Thoracic inlet is within normal limits. Other: None IMPRESSION: No acute intracranial abnormality. Findings consistent with age related atrophy and chronic small vessel ischemia No acute fracture or malalignment of the spine. Cervical spine spondylosis most notable at C5-C6. Electronically Signed   By: Prudencio Pair M.D.   On: 12/31/2019 21:22   CT Cervical Spine Wo Contrast  Result Date: 12/24/2019 CLINICAL DATA:  Head trauma, minor. Unwitnessed fall, neck pain, dementia. EXAM: CT HEAD WITHOUT CONTRAST CT CERVICAL SPINE WITHOUT CONTRAST TECHNIQUE: Multidetector CT imaging of the head and cervical spine was performed following the standard protocol without intravenous contrast. Multiplanar CT image reconstructions of the cervical spine were also generated. COMPARISON:  Head CT 12/10/2019, CT cervical spine 12/10/2019. FINDINGS: CT HEAD FINDINGS Brain: Stable mild generalized parenchymal atrophy and chronic small vessel ischemic disease. There is no acute intracranial hemorrhage. No demarcated cortical infarct. No extra-axial fluid collection. No evidence of intracranial mass. No midline shift. Vascular: No hyperdense vessel. Skull: Normal. Negative for fracture or focal lesion. Sinuses/Orbits: Visualized orbits show no acute finding. Mild ethmoid sinus mucosal thickening. No significant mastoid effusion. CT CERVICAL SPINE FINDINGS Alignment: Straightening of the expected cervical lordosis. Unchanged 2 mm C3-C4 grade 1 anterolisthesis. Skull base and vertebrae: The basion-dental and atlanto-dental intervals are maintained.No evidence of acute fracture to the cervical spine. Soft tissues and spinal canal: No prevertebral fluid or swelling. No visible canal hematoma. Disc levels: Cervical spondylosis with multilevel disc space narrowing, posterior  disc osteophytes, uncovertebral and facet hypertrophy. Disc space narrowing is severe at the C4-C5 through C7-T1 levels. Degenerative fusion across the C4-C5 and C5-C6 disc spaces. Additionally, there is degenerative fusion across the facet joints bilaterally at C4-C5. A posterior disc osteophyte complex contributes to mild/moderate bony spinal canal stenosis at C5-C6. Multilevel bony neural foraminal narrowing. Upper chest: No consolidation within the imaged lung apices. No visible pneumothorax. IMPRESSION: CT head: 1. No evidence of acute intracranial abnormality. 2. Stable mild generalized parenchymal atrophy and chronic small vessel ischemic disease. 3. Mild ethmoid sinus mucosal thickening. CT cervical spine: 1. No evidence of acute fracture to the cervical spine. 2. Unchanged 2 mm C3-C4 grade 1 anterolisthesis. 3. Advanced cervical spondylosis as outlined. Electronically Signed   By: Kellie Simmering DO   On: 12/24/2019 13:11   CT Cervical Spine Wo Contrast  Result Date: 12/10/2019 CLINICAL DATA:  Posttraumatic headache after witnessed fall. EXAM: CT HEAD WITHOUT CONTRAST CT CERVICAL SPINE WITHOUT CONTRAST TECHNIQUE: Multidetector CT imaging of the head and cervical spine was performed following the standard protocol without intravenous contrast. Multiplanar CT image reconstructions of the cervical spine were also generated. COMPARISON:  December 06, 2019. FINDINGS: CT HEAD FINDINGS Brain: Mild diffuse cortical atrophy is noted. Mild chronic ischemic white matter disease is noted. No mass effect or midline shift is noted. Ventricular size is within normal limits. There is no evidence of mass lesion, hemorrhage or acute infarction. Vascular: No hyperdense vessel or unexpected calcification. Skull: Normal. Negative for fracture or focal lesion. Sinuses/Orbits: No acute finding. Other: None. CT CERVICAL SPINE FINDINGS Alignment: Mild grade 1 anterolisthesis of C3-4 is noted secondary to posterior facet joint  hypertrophy. Skull base  and vertebrae: No acute fracture. No primary bone lesion or focal pathologic process. Soft tissues and spinal canal: No prevertebral fluid or swelling. No visible canal hematoma. Disc levels: Severe degenerative disc disease is noted at C4-5, C5-6, C6-7 and C7-T1. Upper chest: Negative. Other: Degenerative changes are seen involving posterior facet joints bilaterally. IMPRESSION: 1. Mild diffuse cortical atrophy. Mild chronic ischemic white matter disease. No acute intracranial abnormality seen. 2. Severe multilevel degenerative disc disease. No acute abnormality seen in the cervical spine. Electronically Signed   By: Marijo Conception M.D.   On: 12/10/2019 18:34   CT Cervical Spine Wo Contrast  Result Date: 12/06/2019 CLINICAL DATA:  Unwitnessed fall, possible seizure, history of dementia EXAM: CT HEAD WITHOUT CONTRAST CT CERVICAL SPINE WITHOUT CONTRAST TECHNIQUE: Multidetector CT imaging of the head and cervical spine was performed following the standard protocol without intravenous contrast. Multiplanar CT image reconstructions of the cervical spine were also generated. COMPARISON:  11/01/2019 FINDINGS: CT HEAD FINDINGS Brain: No evidence of acute infarction, hemorrhage, hydrocephalus, extra-axial collection or mass lesion/mass effect. Periventricular and deep white matter hypodensity. Vascular: No hyperdense vessel or unexpected calcification. Skull: Normal. Negative for fracture or focal lesion. Sinuses/Orbits: No acute finding. Other: None. CT CERVICAL SPINE FINDINGS Alignment: Normal. Skull base and vertebrae: No acute fracture. No primary bone lesion or focal pathologic process. Soft tissues and spinal canal: No prevertebral fluid or swelling. No visible canal hematoma. Disc levels: Moderate to severe disc space height loss and osteophytosis of the lower cervical spine, worst at C5-C6. Upper chest: Negative. Other: None. IMPRESSION: 1. No acute intracranial pathology. Small-vessel  white matter disease. 2. No fracture or static subluxation of the cervical spine. 3. Moderate to severe disc space height loss and osteophytosis of the lower cervical spine, worst at C5-C6. Electronically Signed   By: Eddie Candle M.D.   On: 12/06/2019 14:10   CT ABDOMEN PELVIS W CONTRAST  Result Date: 01/01/2020 CLINICAL DATA:  Fall and tripped, right hip pain EXAM: CT ABDOMEN AND PELVIS WITH CONTRAST TECHNIQUE: Multidetector CT imaging of the abdomen and pelvis was performed using the standard protocol following bolus administration of intravenous contrast. CONTRAST:  129mL OMNIPAQUE IOHEXOL 300 MG/ML  SOLN COMPARISON:  Radiograph same day FINDINGS: Hepatobiliary: Homogeneous hepatic attenuation without traumatic injury. No focal lesion. Gallbladder physiologically distended, no calcified stone. No biliary dilatation. Pancreas: No evidence for traumatic injury. Fatty atrophy of the pancreas is noted. No ductal dilatation or inflammation. Spleen: Homogeneous attenuation without traumatic injury. Normal in size. Adrenals/Urinary Tract: No adrenal hemorrhage. Kidneys demonstrate symmetric enhancement and excretion on delayed phase imaging. No evidence or renal injury. Ureters are well opacified proximal through mid portion. Bladder is physiologically distended without wall thickening. Stomach/Bowel: Suboptimally assessed without enteric contrast, allowing for this, no evidence of bowel injury. Stomach physiologically distended. There are no dilated or thickened small or large bowel loops. Moderate stool burden. No evidence of mesenteric hematoma. No free air free fluid. Vascular/Lymphatic: No acute vascular injury. The abdominal aorta and IVC are intact. Scattered aortic atherosclerotic calcifications are seen without aneurysmal dilatation. No evidence of retroperitoneal, abdominal, or pelvic adenopathy. Reproductive: No acute abnormality. Other: No focal contusion or abnormality of the abdominal wall.  Musculoskeletal: There is a comminuted slightly impacted fracture involving the right femoral greater tuberosity. Overlying soft tissue swelling seen along the right lateral hip. There is also nondisplaced fractures of the posterior right tenth and eleventh ribs. There appears to be age indeterminate, slight compression deformities of the L3 and  L4 vertebral bodies with less than 25% loss in height. A x stop fixation is noted at L4-L5. IMPRESSION: Comminuted impacted nondisplaced fracture of the right greater trochanter. Nondisplaced posterior right tenth and eleventh rib fractures. Age indeterminate slight superior compression deformities of the L3 and L4 vertebral bodies with less than 25% loss height. No acute intra-abdominal or pelvic injury. Aortic Atherosclerosis (ICD10-I70.0). Electronically Signed   By: Prudencio Pair M.D.   On: 01/01/2020 01:29   CT L-SPINE NO CHARGE  Result Date: 01/01/2020 CLINICAL DATA:  Fall and pain EXAM: CT LUMBAR SPINE WITHOUT CONTRAST TECHNIQUE: Multidetector CT imaging of the lumbar spine was performed without intravenous contrast administration. Multiplanar CT image reconstructions were also generated. COMPARISON:  None. FINDINGS: Segmentation: There are 5 non-rib bearing lumbar type vertebral bodies with the last intervertebral disc space labeled as L5-S1. 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 A grade 1 anterolisthesis of L4 on L5 is seen measuring 4 mm. Vertebrae: There is age indeterminate slight superior compression deformity of the L3 and L4 vertebral bodies with less than 25% loss in height. There is a posterior X stop fixation at L4-L5. Paraspinal and other soft tissues: The paraspinal soft tissues and visualized retroperitoneal structures are unremarkable. The sacroiliac joints are intact. Scattered aortic atherosclerosis is. Disc levels: Mild disc height loss with facet arthropathy seen throughout the lumbar spine. No significant canal  or neural foraminal stenosis however is noted IMPRESSION: Age indeterminate slight superior compression deformities of the L3 and L4 vertebral bodies with less than 25% loss in height. Electronically Signed   By: Prudencio Pair M.D.   On: 01/01/2020 01:35   DG Chest Portable 1 View  Result Date: 12/31/2019 CLINICAL DATA:  Fall EXAM: PORTABLE CHEST 1 VIEW COMPARISON:  06/14/2018 FINDINGS: The heart size and mediastinal contours are within normal limits. Both lungs are clear. The visualized skeletal structures are unremarkable. IMPRESSION: No active disease. Electronically Signed   By: Donavan Foil M.D.   On: 12/31/2019 22:43   DG Hip Unilat W or Wo Pelvis 2-3 Views Right  Result Date: 12/31/2019 CLINICAL DATA:  Trip and fall injury with right-sided hip pain. EXAM: DG HIP (WITH OR WITHOUT PELVIS) 2-3V RIGHT COMPARISON:  06/14/2018 mild degenerative changes in the right hip. No change in appearance since prior study. No evidence of acute fracture or dislocation. No focal bone lesion or bone destruction. Pelvis and sacrum appear intact. Degenerative changes in the lower lumbar spine. FINDINGS: Mild degenerative changes in the right hip. No change in appearance since prior study. No evidence of acute fracture or dislocation. No focal bone lesion or bone destruction. Pelvis and sacrum appear intact. Degenerative changes in the lower lumbar spine. IMPRESSION: No acute bony abnormalities. Electronically Signed   By: Lucienne Capers M.D.   On: 12/31/2019 21:07    Subjective:   Patient was seen and examined 01/04/2020, 10:33 AM Patient stable today. No acute distress.  No issues overnight Stable for discharge.  Discharge Exam:    Vitals:   01/03/20 0614 01/03/20 1338 01/03/20 2253 01/04/20 0623  BP: 105/63 (!) 129/57 (!) 121/107 97/70  Pulse: (!) 58 72 74 72  Resp: 14 18 16 16   Temp: 97.7 F (36.5 C) (!) 97.4 F (36.3 C) (!) 97.3 F (36.3 C) (!) 97.4 F (36.3 C)  TempSrc: Axillary Oral Axillary  Axillary  SpO2: 99% 100% 99% 100%  Weight:      Height:        General:  Pt lying comfortably in bed & appears in no obvious distress.AAOx1 Cardiovascular: S1 & S2 heard, RRR, S1/S2 +. No murmurs, rubs, gallops or clicks. No JVD or pedal edema. Respiratory: Clear to auscultation without wheezing, rhonchi or crackles. No increased work of breathing. Abdominal:  Non-distended, non-tender & soft. No organomegaly or masses appreciated. Normal bowel sounds heard. CNS: Alert and oriented. No focal deficits. Extremities: no edema, no cyanosis    The results of significant diagnostics from this hospitalization (including imaging, microbiology, ancillary and laboratory) are listed below for reference.      Microbiology:   Recent Results (from the past 240 hour(s))  SARS Coronavirus 2 by RT PCR (hospital order, performed in [facility] hospital lab) Nasopharyngeal Nasopharyngeal Swab     Status: None   Collection Time: 01/01/20  3:22 AM   Specimen: Nasopharyngeal Swab  Result Value Ref Range Status   SARS Coronavirus 2 NEGATIVE NEGATIVE Final    Comment: (NOTE) SARS-CoV-2 target nucleic acids are NOT DETECTED.  The SARS-CoV-2 RNA is generally detectable in upper and lower respiratory specimens during the acute phase of infection. The lowest concentration of SARS-CoV-2 viral copies this assay can detect is 250 copies / mL. A negative result does not preclude SARS-CoV-2 infection and should not be used as the sole basis for treatment or other patient management decisions.  A negative result may occur with improper specimen collection / handling, submission of specimen other than nasopharyngeal swab, presence of viral mutation(s) within the areas targeted by this assay, and inadequate number of viral copies (<250 copies / mL). A negative result must be combined with clinical observations, patient history, and epidemiological information.  Fact Sheet for Patients:     StrictlyIdeas.no  Fact Sheet for Healthcare Providers: BankingDealers.co.za  This test is not yet approved or  cleared by the [place] FDA and has been authorized for detection and/or diagnosis of SARS-CoV-2 by FDA under an Emergency Use Authorization (EUA).  This EUA will remain in effect (meaning this test can be used) for the duration of the COVID-19 declaration under Section 564(b)(1) of the Act, 21 U.S.C. section 360bbb-3(b)(1), unless the authorization is terminated or revoked sooner.  Performed at [facility], [place] [address], [place], [place] [address]   MRSA PCR Screening     Status: None   Collection Time: 01/02/20  2:21 AM   Specimen: Nasal Mucosa; Nasopharyngeal  Result Value Ref Range Status   MRSA by PCR NEGATIVE NEGATIVE Final    Comment:        The GeneXpert MRSA Assay (FDA approved for NASAL specimens only), is one component of a comprehensive MRSA colonization surveillance program. It is not intended to diagnose MRSA infection nor to guide or monitor treatment for MRSA infections. Performed at [facility], [place] [address] [place],  [address]      Labs:   CBC: Recent Labs  Lab 12/31/19 0001 01/01/20 0500 01/02/20 0227 01/03/20 0308 01/04/20 0338  WBC 8.2 5.9 5.4 5.4 5.2  HGB 11.0* 10.6* 10.9* 11.0* 11.0*  HCT 33.1* 32.3* 34.2* 33.7* 33.8*  MCV 85.8 85.4 86.6 84.9 86.2  PLT 213 185 184 184 101   Basic Metabolic Panel: Recent Labs  Lab 12/31/19 0001 01/01/20 0500  NA 142 137  K 3.8 3.5  CL 105 103  CO2 26 25  GLUCOSE 86 88  BUN 28* 22  CREATININE 0.67 0.59  CALCIUM 9.2 8.9   Liver Function Tests: No results for input(s): AST, ALT,  ALKPHOS, BILITOT, PROT, ALBUMIN in the last 168 hours. BNP (last 3 results) No results for input(s): BNP in the last 8760 hours. Cardiac Enzymes: No results for input(s): CKTOTAL, CKMB, CKMBINDEX, TROPONINI  in the last 168 hours. CBG: Recent Labs  Lab 01/03/20 1644 01/03/20 2005 01/04/20 0003 01/04/20 0338 01/04/20 0806  GLUCAP 97 190* 90 93 70   Hgb A1c No results for input(s): HGBA1C in the last 72 hours. Lipid Profile No results for input(s): CHOL, HDL, LDLCALC, TRIG, CHOLHDL, LDLDIRECT in the last 72 hours. Thyroid function studies No results for input(s): TSH, T4TOTAL, T3FREE, THYROIDAB in the last 72 hours.  Invalid input(s): FREET3 Anemia work up No results for input(s): VITAMINB12, FOLATE, FERRITIN, TIBC, IRON, RETICCTPCT in the last 72 hours. Urinalysis    Component Value Date/Time   COLORURINE STRAW (A) 01/01/2020 0202   APPEARANCEUR CLEAR 01/01/2020 0202   LABSPEC >1.046 (H) 01/01/2020 0202   PHURINE 7.0 01/01/2020 0202   GLUCOSEU NEGATIVE 01/01/2020 0202   HGBUR SMALL (A) 01/01/2020 0202   BILIRUBINUR NEGATIVE 01/01/2020 0202   BILIRUBINUR negative 06/10/2012 1631   KETONESUR NEGATIVE 01/01/2020 0202   PROTEINUR NEGATIVE 01/01/2020 0202   UROBILINOGEN negative 06/10/2012 1631   NITRITE NEGATIVE 01/01/2020 0202   LEUKOCYTESUR NEGATIVE 01/01/2020 0202         Time coordinating discharge: Over 41 minutes  SIGNED: Deatra James, MD, FACP, FHM. [facility],  Please use [email or web address] to Page If 7PM-7AM, please contact night-coverage [email or web address] Ota [facility] 01/04/2020, 10:33 AM

## 2020-01-04 NOTE — Progress Notes (Signed)
   Please call Marco Collie [phone].  Discussed the case in detail since patient has been discharged SNF Dr. Nyra Capes recommended reevaluation by PT, she will reevaluate  Thank you,  Dr. Roger Shelter

## 2020-01-05 DIAGNOSIS — R531 Weakness: Secondary | ICD-10-CM | POA: Diagnosis not present

## 2020-01-05 DIAGNOSIS — D509 Iron deficiency anemia, unspecified: Secondary | ICD-10-CM | POA: Diagnosis not present

## 2020-01-05 DIAGNOSIS — R278 Other lack of coordination: Secondary | ICD-10-CM | POA: Diagnosis not present

## 2020-01-05 DIAGNOSIS — S2241XD Multiple fractures of ribs, right side, subsequent encounter for fracture with routine healing: Secondary | ICD-10-CM | POA: Diagnosis not present

## 2020-01-05 DIAGNOSIS — R2681 Unsteadiness on feet: Secondary | ICD-10-CM | POA: Diagnosis not present

## 2020-01-05 DIAGNOSIS — F0391 Unspecified dementia with behavioral disturbance: Secondary | ICD-10-CM | POA: Diagnosis not present

## 2020-01-05 DIAGNOSIS — R413 Other amnesia: Secondary | ICD-10-CM | POA: Diagnosis not present

## 2020-01-05 DIAGNOSIS — Y92129 Unspecified place in nursing home as the place of occurrence of the external cause: Secondary | ICD-10-CM

## 2020-01-05 DIAGNOSIS — S72001D Fracture of unspecified part of neck of right femur, subsequent encounter for closed fracture with routine healing: Secondary | ICD-10-CM | POA: Diagnosis not present

## 2020-01-05 DIAGNOSIS — R633 Feeding difficulties: Secondary | ICD-10-CM | POA: Diagnosis not present

## 2020-01-05 DIAGNOSIS — I10 Essential (primary) hypertension: Secondary | ICD-10-CM | POA: Diagnosis not present

## 2020-01-05 DIAGNOSIS — W19XXXA Unspecified fall, initial encounter: Secondary | ICD-10-CM | POA: Diagnosis not present

## 2020-01-05 DIAGNOSIS — S32039S Unspecified fracture of third lumbar vertebra, sequela: Secondary | ICD-10-CM | POA: Diagnosis not present

## 2020-01-05 DIAGNOSIS — M6281 Muscle weakness (generalized): Secondary | ICD-10-CM | POA: Diagnosis not present

## 2020-01-05 DIAGNOSIS — F0390 Unspecified dementia without behavioral disturbance: Secondary | ICD-10-CM | POA: Diagnosis not present

## 2020-01-05 DIAGNOSIS — R41841 Cognitive communication deficit: Secondary | ICD-10-CM | POA: Diagnosis not present

## 2020-01-05 DIAGNOSIS — R2689 Other abnormalities of gait and mobility: Secondary | ICD-10-CM | POA: Diagnosis not present

## 2020-01-05 DIAGNOSIS — R5381 Other malaise: Secondary | ICD-10-CM | POA: Diagnosis not present

## 2020-01-05 DIAGNOSIS — S72114D Nondisplaced fracture of greater trochanter of right femur, subsequent encounter for closed fracture with routine healing: Secondary | ICD-10-CM | POA: Diagnosis not present

## 2020-01-05 DIAGNOSIS — E039 Hypothyroidism, unspecified: Secondary | ICD-10-CM | POA: Diagnosis not present

## 2020-01-05 DIAGNOSIS — D680 Von Willebrand's disease: Secondary | ICD-10-CM | POA: Diagnosis not present

## 2020-01-05 DIAGNOSIS — S72101D Unspecified trochanteric fracture of right femur, subsequent encounter for closed fracture with routine healing: Secondary | ICD-10-CM | POA: Diagnosis not present

## 2020-01-05 DIAGNOSIS — D649 Anemia, unspecified: Secondary | ICD-10-CM | POA: Diagnosis not present

## 2020-01-05 DIAGNOSIS — M2550 Pain in unspecified joint: Secondary | ICD-10-CM | POA: Diagnosis not present

## 2020-01-05 DIAGNOSIS — Z7401 Bed confinement status: Secondary | ICD-10-CM | POA: Diagnosis not present

## 2020-01-05 LAB — GLUCOSE, CAPILLARY
Glucose-Capillary: 109 mg/dL — ABNORMAL HIGH (ref 70–99)
Glucose-Capillary: 128 mg/dL — ABNORMAL HIGH (ref 70–99)
Glucose-Capillary: 72 mg/dL (ref 70–99)
Glucose-Capillary: 72 mg/dL (ref 70–99)
Glucose-Capillary: 88 mg/dL (ref 70–99)
Glucose-Capillary: 92 mg/dL (ref 70–99)
Glucose-Capillary: 93 mg/dL (ref 70–99)

## 2020-01-05 LAB — CBC
HCT: 33.4 % — ABNORMAL LOW (ref 36.0–46.0)
Hemoglobin: 10.8 g/dL — ABNORMAL LOW (ref 12.0–15.0)
MCH: 27.7 pg (ref 26.0–34.0)
MCHC: 32.3 g/dL (ref 30.0–36.0)
MCV: 85.6 fL (ref 80.0–100.0)
Platelets: 172 10*3/uL (ref 150–400)
RBC: 3.9 MIL/uL (ref 3.87–5.11)
RDW: 13.7 % (ref 11.5–15.5)
WBC: 4.5 10*3/uL (ref 4.0–10.5)
nRBC: 0 % (ref 0.0–0.2)

## 2020-01-05 LAB — SARS CORONAVIRUS 2 BY RT PCR (HOSPITAL ORDER, PERFORMED IN ~~LOC~~ HOSPITAL LAB): SARS Coronavirus 2: NEGATIVE

## 2020-01-05 NOTE — Discharge Summary (Signed)
Physician Discharge Summary  Stacy Davis IWL:[id number] DOB: [birth year] DOA: 12/31/2019  PCP: Susy Frizzle, MD  Admit date: 12/31/2019 Discharge date: 01/05/2020  Admitted From: Skilled nursing facility Disposition: Skilled nursing facility Recommendations for Outpatient Follow-up:  1. Follow up with PCP in 1-2 weeks 2. Please obtain BMP/CBC in one week 3. Please follow up with Ortho 1 week  Home Health: Physical therapy Equipment/Devices: None  Discharge Condition stable CODE STATUS full code Diet recommendation: Cardiac Brief/Interim Summary:74 y.o.femalewithhistory of advanced dementia, hypothyroidism, seizures, depression and hypothyroidism was brought to the ER after patient had a fall while walking and tripped on the walker. Patient was complaining of right hip pain was brought to the ER.  ED: Patient had CT head C-spine and CT abdomen and pelvis.  CT scan showed right-sided comminuted impacted nondisplaced fracture of the right greater trochanter and nondisplaced fracture of the right 10th and 11th rib.  There was also an age-indeterminate compression fracture of L3 and L4. Lab work was significant for hemoglobin of 11/ Covid test was negative.  Orthopedic surgeon Dr. Veverly Fells consulted.   Recommend no surgical intervention, continue aggressive PT for mobilization, minimal weightbearing to the right to 25%, follow-up in office    Discharge Diagnoses:  Active Problems:   HTN (hypertension)   Memory loss   Hypothyroid   Von Willebrand disease ([place])   Dementia ([place])   Trochanteric fracture ([place])   Closed rib fracture   Anemia   Hip fx ([place])   Palliative care by specialist   Goals of care, counseling/discussion   General weakness     Right hip fracture: -Hemodynamically stable, -Continue pain management -Status post Ortho evaluation-recommended no surgical intervention, Recommending mobilization minimal weightbearing on the right hip  25%, Outpatient follow-up -Patient has been n.p.o., and was able diet as tolerated  Right-sided commuted impacted nondisplaced fracture of the right greater trochanter and also fractures of the right 10th and 11th rib and age-indeterminate fracture of the lumbar L3-L4 spine after fall for which Dr. Veverly Fells orthopedic surgeon has been consulted.  -Standing from aggressive pain pain medication due to sedative, somnolent, and altered mental status.  Currently high-dose NSAIDs, alternated with Tylenol recommended.  -Status post evaluation by orthopedic team recommending no surgical intervention -continue aggressive PT for mobilization with minimal WB on her right hip 25%-follow-up with orthopedic team in office.    Hypothyroidism on Synthroid.  History of seizures on Keppra which have dosed as IV for now.  Anemia appears to be chronic follow CBC.  Advance Dementia and depression on Seroquel and Zoloft.  History of von Willebrand disease ... Hematology was consulted for history of presumed von Willebrand's disease.  No new recommendations were made. Estimated body mass index is 20.62 kg/m as calculated from the following:   Height as of this encounter: 5\' 3"  (1.6 m).   Weight as of this encounter: 52.8 kg.  Discharge Instructions  Discharge Instructions    Activity as tolerated - No restrictions   Complete by: As directed    Diet - low sodium heart healthy   Complete by: As directed    Diet - low sodium heart healthy   Complete by: As directed    Discharge instructions   Complete by: As directed    Fall precaution.  Follow-up with orthopedic team as scheduled.  PT OT per orthopedic recommendations   Increase activity slowly   Complete by: As directed    Increase activity slowly   Complete by: As directed  Allergies as of 01/05/2020      Reactions   Tramadol Other (See Comments)   CHANGES IN MEMORY      Medication List    STOP taking these medications    traMADol 50 MG tablet Commonly known as: ULTRAM     TAKE these medications   aspirin EC 325 MG tablet Take 1 tablet (325 mg total) by mouth 2 (two) times daily.   ketorolac 10 MG tablet Commonly known as: TORADOL Take 1 tablet (10 mg total) by mouth every 6 (six) hours as needed for moderate pain or severe pain.   levETIRAcetam 500 MG 24 hr tablet Commonly known as: Keppra XR Take 2 tablets (1,000 mg total) by mouth at bedtime AND 1 tablet (500 mg total) in the morning.   levothyroxine 88 MCG tablet Commonly known as: SYNTHROID TAKE 1 TABLET BY MOUTH ONCE A DAY   LORazepam 0.5 MG tablet Commonly known as: ATIVAN Take 0.5 mg by mouth 2 (two) times daily as needed for anxiety.   melatonin 3 MG Tabs tablet Take 3 mg by mouth at bedtime.   omeprazole 20 MG capsule Commonly known as: PriLOSEC Take 1 capsule (20 mg total) by mouth 2 (two) times daily. What changed:   medication strength  how much to take  when to take this   QUEtiapine 25 MG tablet Commonly known as: SEROquel Take 1 tablet (25 mg total) by mouth at bedtime.   sertraline 50 MG tablet Commonly known as: ZOLOFT Take 1 tablet (50 mg total) by mouth daily.       Contact information for after-discharge care    Destination    [facility] .   Service: Skilled Nursing Contact information: [place] [address] [phone]                 Allergies  Allergen Reactions  . Tramadol Other (See Comments)    CHANGES IN MEMORY    Consultations: ORTHO  Procedures/Studies: DG Wrist Complete Right  Result Date: 12/24/2019 CLINICAL DATA:  Fall, pain EXAM: RIGHT WRIST - COMPLETE 3+ VIEW COMPARISON:  November 01, 2019 FINDINGS: There is a transverse acute fracture of the distal right radius with dorsal angulation. No substantial displacement identified. No definite radiocarpal extension. IMPRESSION: Acute transverse fracture of the distal right radius  with dorsal angulation. Electronically Signed   By: Macy Mis M.D.   On: 12/24/2019 13:23   CT Head Wo Contrast  Result Date: 12/31/2019 CLINICAL DATA:  Fall EXAM: CT HEAD WITHOUT CONTRAST TECHNIQUE: Contiguous axial images were obtained from the base of the skull through the vertex without intravenous contrast. COMPARISON:  December 24, 2019 FINDINGS: Brain: No evidence of acute territorial infarction, hemorrhage, hydrocephalus,extra-axial collection or mass lesion/mass effect. There is dilatation the ventricles and sulci consistent with age-related atrophy. Low-attenuation changes in the deep white matter consistent with small vessel ischemia. Vascular: No hyperdense vessel or unexpected calcification. Skull: The skull is intact. No fracture or focal lesion identified. Sinuses/Orbits: The visualized paranasal sinuses and mastoid air cells are clear. The orbits and globes intact. Other: None Cervical spine: Somewhat limited due to patient motion. Alignment: There is a minimal anterolisthesis of C3 on C4 measuring 2 mm. Skull base and vertebrae: Visualized skull base is intact. No atlanto-occipital dissociation. The vertebral body heights are well maintained. No fracture or pathologic osseous lesion seen. Soft tissues and spinal canal: The visualized paraspinal soft tissues are unremarkable. No prevertebral soft tissue swelling is seen.  The spinal canal is grossly unremarkable, no large epidural collection or significant canal narrowing. Disc levels: Multilevel cervical spine spondylosis is seen with anterior osteophytes disc osteophyte complex and uncovertebral osteophytes most notable at C5-C6 with severe neural foraminal narrowing and mild to moderate central canal stenosis. Upper chest: The lung apices are clear. Thoracic inlet is within normal limits. Other: None IMPRESSION: No acute intracranial abnormality. Findings consistent with age related atrophy and chronic small vessel ischemia No acute fracture or  malalignment of the spine. Cervical spine spondylosis most notable at C5-C6. Electronically Signed   By: Prudencio Pair M.D.   On: 12/31/2019 21:22   CT Head Wo Contrast  Result Date: 12/24/2019 CLINICAL DATA:  Head trauma, minor. Unwitnessed fall, neck pain, dementia. EXAM: CT HEAD WITHOUT CONTRAST CT CERVICAL SPINE WITHOUT CONTRAST TECHNIQUE: Multidetector CT imaging of the head and cervical spine was performed following the standard protocol without intravenous contrast. Multiplanar CT image reconstructions of the cervical spine were also generated. COMPARISON:  Head CT 12/10/2019, CT cervical spine 12/10/2019. FINDINGS: CT HEAD FINDINGS Brain: Stable mild generalized parenchymal atrophy and chronic small vessel ischemic disease. There is no acute intracranial hemorrhage. No demarcated cortical infarct. No extra-axial fluid collection. No evidence of intracranial mass. No midline shift. Vascular: No hyperdense vessel. Skull: Normal. Negative for fracture or focal lesion. Sinuses/Orbits: Visualized orbits show no acute finding. Mild ethmoid sinus mucosal thickening. No significant mastoid effusion. CT CERVICAL SPINE FINDINGS Alignment: Straightening of the expected cervical lordosis. Unchanged 2 mm C3-C4 grade 1 anterolisthesis. Skull base and vertebrae: The basion-dental and atlanto-dental intervals are maintained.No evidence of acute fracture to the cervical spine. Soft tissues and spinal canal: No prevertebral fluid or swelling. No visible canal hematoma. Disc levels: Cervical spondylosis with multilevel disc space narrowing, posterior disc osteophytes, uncovertebral and facet hypertrophy. Disc space narrowing is severe at the C4-C5 through C7-T1 levels. Degenerative fusion across the C4-C5 and C5-C6 disc spaces. Additionally, there is degenerative fusion across the facet joints bilaterally at C4-C5. A posterior disc osteophyte complex contributes to mild/moderate bony spinal canal stenosis at C5-C6.  Multilevel bony neural foraminal narrowing. Upper chest: No consolidation within the imaged lung apices. No visible pneumothorax. IMPRESSION: CT head: 1. No evidence of acute intracranial abnormality. 2. Stable mild generalized parenchymal atrophy and chronic small vessel ischemic disease. 3. Mild ethmoid sinus mucosal thickening. CT cervical spine: 1. No evidence of acute fracture to the cervical spine. 2. Unchanged 2 mm C3-C4 grade 1 anterolisthesis. 3. Advanced cervical spondylosis as outlined. Electronically Signed   By: Kellie Simmering DO   On: 12/24/2019 13:11   CT Head Wo Contrast  Result Date: 12/10/2019 CLINICAL DATA:  Posttraumatic headache after witnessed fall. EXAM: CT HEAD WITHOUT CONTRAST CT CERVICAL SPINE WITHOUT CONTRAST TECHNIQUE: Multidetector CT imaging of the head and cervical spine was performed following the standard protocol without intravenous contrast. Multiplanar CT image reconstructions of the cervical spine were also generated. COMPARISON:  December 06, 2019. FINDINGS: CT HEAD FINDINGS Brain: Mild diffuse cortical atrophy is noted. Mild chronic ischemic white matter disease is noted. No mass effect or midline shift is noted. Ventricular size is within normal limits. There is no evidence of mass lesion, hemorrhage or acute infarction. Vascular: No hyperdense vessel or unexpected calcification. Skull: Normal. Negative for fracture or focal lesion. Sinuses/Orbits: No acute finding. Other: None. CT CERVICAL SPINE FINDINGS Alignment: Mild grade 1 anterolisthesis of C3-4 is noted secondary to posterior facet joint hypertrophy. Skull base and vertebrae: No acute fracture. No primary  bone lesion or focal pathologic process. Soft tissues and spinal canal: No prevertebral fluid or swelling. No visible canal hematoma. Disc levels: Severe degenerative disc disease is noted at C4-5, C5-6, C6-7 and C7-T1. Upper chest: Negative. Other: Degenerative changes are seen involving posterior facet joints  bilaterally. IMPRESSION: 1. Mild diffuse cortical atrophy. Mild chronic ischemic white matter disease. No acute intracranial abnormality seen. 2. Severe multilevel degenerative disc disease. No acute abnormality seen in the cervical spine. Electronically Signed   By: Marijo Conception M.D.   On: 12/10/2019 18:34   CT Cervical Spine Wo Contrast  Result Date: 12/31/2019 CLINICAL DATA:  Fall EXAM: CT HEAD WITHOUT CONTRAST TECHNIQUE: Contiguous axial images were obtained from the base of the skull through the vertex without intravenous contrast. COMPARISON:  December 24, 2019 FINDINGS: Brain: No evidence of acute territorial infarction, hemorrhage, hydrocephalus,extra-axial collection or mass lesion/mass effect. There is dilatation the ventricles and sulci consistent with age-related atrophy. Low-attenuation changes in the deep white matter consistent with small vessel ischemia. Vascular: No hyperdense vessel or unexpected calcification. Skull: The skull is intact. No fracture or focal lesion identified. Sinuses/Orbits: The visualized paranasal sinuses and mastoid air cells are clear. The orbits and globes intact. Other: None Cervical spine: Somewhat limited due to patient motion. Alignment: There is a minimal anterolisthesis of C3 on C4 measuring 2 mm. Skull base and vertebrae: Visualized skull base is intact. No atlanto-occipital dissociation. The vertebral body heights are well maintained. No fracture or pathologic osseous lesion seen. Soft tissues and spinal canal: The visualized paraspinal soft tissues are unremarkable. No prevertebral soft tissue swelling is seen. The spinal canal is grossly unremarkable, no large epidural collection or significant canal narrowing. Disc levels: Multilevel cervical spine spondylosis is seen with anterior osteophytes disc osteophyte complex and uncovertebral osteophytes most notable at C5-C6 with severe neural foraminal narrowing and mild to moderate central canal stenosis. Upper  chest: The lung apices are clear. Thoracic inlet is within normal limits. Other: None IMPRESSION: No acute intracranial abnormality. Findings consistent with age related atrophy and chronic small vessel ischemia No acute fracture or malalignment of the spine. Cervical spine spondylosis most notable at C5-C6. Electronically Signed   By: Prudencio Pair M.D.   On: 12/31/2019 21:22   CT Cervical Spine Wo Contrast  Result Date: 12/24/2019 CLINICAL DATA:  Head trauma, minor. Unwitnessed fall, neck pain, dementia. EXAM: CT HEAD WITHOUT CONTRAST CT CERVICAL SPINE WITHOUT CONTRAST TECHNIQUE: Multidetector CT imaging of the head and cervical spine was performed following the standard protocol without intravenous contrast. Multiplanar CT image reconstructions of the cervical spine were also generated. COMPARISON:  Head CT 12/10/2019, CT cervical spine 12/10/2019. FINDINGS: CT HEAD FINDINGS Brain: Stable mild generalized parenchymal atrophy and chronic small vessel ischemic disease. There is no acute intracranial hemorrhage. No demarcated cortical infarct. No extra-axial fluid collection. No evidence of intracranial mass. No midline shift. Vascular: No hyperdense vessel. Skull: Normal. Negative for fracture or focal lesion. Sinuses/Orbits: Visualized orbits show no acute finding. Mild ethmoid sinus mucosal thickening. No significant mastoid effusion. CT CERVICAL SPINE FINDINGS Alignment: Straightening of the expected cervical lordosis. Unchanged 2 mm C3-C4 grade 1 anterolisthesis. Skull base and vertebrae: The basion-dental and atlanto-dental intervals are maintained.No evidence of acute fracture to the cervical spine. Soft tissues and spinal canal: No prevertebral fluid or swelling. No visible canal hematoma. Disc levels: Cervical spondylosis with multilevel disc space narrowing, posterior disc osteophytes, uncovertebral and facet hypertrophy. Disc space narrowing is severe at the C4-C5 through C7-T1 levels.  Degenerative  fusion across the C4-C5 and C5-C6 disc spaces. Additionally, there is degenerative fusion across the facet joints bilaterally at C4-C5. A posterior disc osteophyte complex contributes to mild/moderate bony spinal canal stenosis at C5-C6. Multilevel bony neural foraminal narrowing. Upper chest: No consolidation within the imaged lung apices. No visible pneumothorax. IMPRESSION: CT head: 1. No evidence of acute intracranial abnormality. 2. Stable mild generalized parenchymal atrophy and chronic small vessel ischemic disease. 3. Mild ethmoid sinus mucosal thickening. CT cervical spine: 1. No evidence of acute fracture to the cervical spine. 2. Unchanged 2 mm C3-C4 grade 1 anterolisthesis. 3. Advanced cervical spondylosis as outlined. Electronically Signed   By: Kellie Simmering DO   On: 12/24/2019 13:11   CT Cervical Spine Wo Contrast  Result Date: 12/10/2019 CLINICAL DATA:  Posttraumatic headache after witnessed fall. EXAM: CT HEAD WITHOUT CONTRAST CT CERVICAL SPINE WITHOUT CONTRAST TECHNIQUE: Multidetector CT imaging of the head and cervical spine was performed following the standard protocol without intravenous contrast. Multiplanar CT image reconstructions of the cervical spine were also generated. COMPARISON:  December 06, 2019. FINDINGS: CT HEAD FINDINGS Brain: Mild diffuse cortical atrophy is noted. Mild chronic ischemic white matter disease is noted. No mass effect or midline shift is noted. Ventricular size is within normal limits. There is no evidence of mass lesion, hemorrhage or acute infarction. Vascular: No hyperdense vessel or unexpected calcification. Skull: Normal. Negative for fracture or focal lesion. Sinuses/Orbits: No acute finding. Other: None. CT CERVICAL SPINE FINDINGS Alignment: Mild grade 1 anterolisthesis of C3-4 is noted secondary to posterior facet joint hypertrophy. Skull base and vertebrae: No acute fracture. No primary bone lesion or focal pathologic process. Soft tissues and spinal canal:  No prevertebral fluid or swelling. No visible canal hematoma. Disc levels: Severe degenerative disc disease is noted at C4-5, C5-6, C6-7 and C7-T1. Upper chest: Negative. Other: Degenerative changes are seen involving posterior facet joints bilaterally. IMPRESSION: 1. Mild diffuse cortical atrophy. Mild chronic ischemic white matter disease. No acute intracranial abnormality seen. 2. Severe multilevel degenerative disc disease. No acute abnormality seen in the cervical spine. Electronically Signed   By: Marijo Conception M.D.   On: 12/10/2019 18:34   CT ABDOMEN PELVIS W CONTRAST  Result Date: 01/01/2020 CLINICAL DATA:  Fall and tripped, right hip pain EXAM: CT ABDOMEN AND PELVIS WITH CONTRAST TECHNIQUE: Multidetector CT imaging of the abdomen and pelvis was performed using the standard protocol following bolus administration of intravenous contrast. CONTRAST:  159mL OMNIPAQUE IOHEXOL 300 MG/ML  SOLN COMPARISON:  Radiograph same day FINDINGS: Hepatobiliary: Homogeneous hepatic attenuation without traumatic injury. No focal lesion. Gallbladder physiologically distended, no calcified stone. No biliary dilatation. Pancreas: No evidence for traumatic injury. Fatty atrophy of the pancreas is noted. No ductal dilatation or inflammation. Spleen: Homogeneous attenuation without traumatic injury. Normal in size. Adrenals/Urinary Tract: No adrenal hemorrhage. Kidneys demonstrate symmetric enhancement and excretion on delayed phase imaging. No evidence or renal injury. Ureters are well opacified proximal through mid portion. Bladder is physiologically distended without wall thickening. Stomach/Bowel: Suboptimally assessed without enteric contrast, allowing for this, no evidence of bowel injury. Stomach physiologically distended. There are no dilated or thickened small or large bowel loops. Moderate stool burden. No evidence of mesenteric hematoma. No free air free fluid. Vascular/Lymphatic: No acute vascular injury. The  abdominal aorta and IVC are intact. Scattered aortic atherosclerotic calcifications are seen without aneurysmal dilatation. No evidence of retroperitoneal, abdominal, or pelvic adenopathy. Reproductive: No acute abnormality. Other: No focal contusion or abnormality of the  abdominal wall. Musculoskeletal: There is a comminuted slightly impacted fracture involving the right femoral greater tuberosity. Overlying soft tissue swelling seen along the right lateral hip. There is also nondisplaced fractures of the posterior right tenth and eleventh ribs. There appears to be age indeterminate, slight compression deformities of the L3 and L4 vertebral bodies with less than 25% loss in height. A x stop fixation is noted at L4-L5. IMPRESSION: Comminuted impacted nondisplaced fracture of the right greater trochanter. Nondisplaced posterior right tenth and eleventh rib fractures. Age indeterminate slight superior compression deformities of the L3 and L4 vertebral bodies with less than 25% loss height. No acute intra-abdominal or pelvic injury. Aortic Atherosclerosis (ICD10-I70.0). Electronically Signed   By: Prudencio Pair M.D.   On: 01/01/2020 01:29   CT L-SPINE NO CHARGE  Result Date: 01/01/2020 CLINICAL DATA:  Fall and pain EXAM: CT LUMBAR SPINE WITHOUT CONTRAST TECHNIQUE: Multidetector CT imaging of the lumbar spine was performed without intravenous contrast administration. Multiplanar CT image reconstructions were also generated. COMPARISON:  None. FINDINGS: Segmentation: There are 5 non-rib bearing lumbar type vertebral bodies with the last intervertebral disc space labeled as L5-S1. 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 A grade 1 anterolisthesis of L4 on L5 is seen measuring 4 mm. Vertebrae: There is age indeterminate slight superior compression deformity of the L3 and L4 vertebral bodies with less than 25% loss in height. There is a posterior X stop fixation at L4-L5. Paraspinal and  other soft tissues: The paraspinal soft tissues and visualized retroperitoneal structures are unremarkable. The sacroiliac joints are intact. Scattered aortic atherosclerosis is. Disc levels: Mild disc height loss with facet arthropathy seen throughout the lumbar spine. No significant canal or neural foraminal stenosis however is noted IMPRESSION: Age indeterminate slight superior compression deformities of the L3 and L4 vertebral bodies with less than 25% loss in height. Electronically Signed   By: Prudencio Pair M.D.   On: 01/01/2020 01:35   DG Chest Portable 1 View  Result Date: 12/31/2019 CLINICAL DATA:  Fall EXAM: PORTABLE CHEST 1 VIEW COMPARISON:  06/14/2018 FINDINGS: The heart size and mediastinal contours are within normal limits. Both lungs are clear. The visualized skeletal structures are unremarkable. IMPRESSION: No active disease. Electronically Signed   By: Donavan Foil M.D.   On: 12/31/2019 22:43   DG Hip Unilat W or Wo Pelvis 2-3 Views Right  Result Date: 12/31/2019 CLINICAL DATA:  Trip and fall injury with right-sided hip pain. EXAM: DG HIP (WITH OR WITHOUT PELVIS) 2-3V RIGHT COMPARISON:  06/14/2018 mild degenerative changes in the right hip. No change in appearance since prior study. No evidence of acute fracture or dislocation. No focal bone lesion or bone destruction. Pelvis and sacrum appear intact. Degenerative changes in the lower lumbar spine. FINDINGS: Mild degenerative changes in the right hip. No change in appearance since prior study. No evidence of acute fracture or dislocation. No focal bone lesion or bone destruction. Pelvis and sacrum appear intact. Degenerative changes in the lower lumbar spine. IMPRESSION: No acute bony abnormalities. Electronically Signed   By: Lucienne Capers M.D.   On: 12/31/2019 21:07    (Echo, Carotid, EGD, Colonoscopy, ERCP)    Subjective: Sitting in bed in no acute distress confused  Discharge Exam: Vitals:   01/05/20 0430 01/05/20 1334  BP:  (!) 100/59 92/63  Pulse: (!) 56 77  Resp: 14 14  Temp: (!) 97.4 F (36.3 C) 97.6 F (36.4 C)  SpO2: 100% 100%   Vitals:  01/04/20 1421 01/04/20 1956 01/05/20 0430 01/05/20 1334  BP: 96/69 98/69 (!) 100/59 92/63  Pulse: 81 89 (!) 56 77  Resp: 18 16 14 14   Temp: (!) 97.5 F (36.4 C) 97.7 F (36.5 C) (!) 97.4 F (36.3 C) 97.6 F (36.4 C)  TempSrc: Axillary Oral Oral Axillary  SpO2: 90% 97% 100% 100%  Weight:      Height:        General: Pt is awake confused Cardiovascular: RRR, S1/S2 +, no rubs, no gallops Respiratory: CTA bilaterally, no wheezing, no rhonchi Abdominal: Soft, NT, ND, bowel sounds + Extremities: no edema, no cyanosis    The results of significant diagnostics from this hospitalization (including imaging, microbiology, ancillary and laboratory) are listed below for reference.     Microbiology: Recent Results (from the past 240 hour(s))  SARS Coronavirus 2 by RT PCR (hospital order, performed in [facility] hospital lab) Nasopharyngeal Nasopharyngeal Swab     Status: None   Collection Time: 01/01/20  3:22 AM   Specimen: Nasopharyngeal Swab  Result Value Ref Range Status   SARS Coronavirus 2 NEGATIVE NEGATIVE Final    Comment: (NOTE) SARS-CoV-2 target nucleic acids are NOT DETECTED.  The SARS-CoV-2 RNA is generally detectable in upper and lower respiratory specimens during the acute phase of infection. The lowest concentration of SARS-CoV-2 viral copies this assay can detect is 250 copies / mL. A negative result does not preclude SARS-CoV-2 infection and should not be used as the sole basis for treatment or other patient management decisions.  A negative result may occur with improper specimen collection / handling, submission of specimen other than nasopharyngeal swab, presence of viral mutation(s) within the areas targeted by this assay, and inadequate number of viral copies (<250 copies / mL). A negative result must be combined with  clinical observations, patient history, and epidemiological information.  Fact Sheet for Patients:   StrictlyIdeas.no  Fact Sheet for Healthcare Providers: BankingDealers.co.za  This test is not yet approved or  cleared by the [place] FDA and has been authorized for detection and/or diagnosis of SARS-CoV-2 by FDA under an Emergency Use Authorization (EUA).  This EUA will remain in effect (meaning this test can be used) for the duration of the COVID-19 declaration under Section 564(b)(1) of the Act, 21 U.S.C. section 360bbb-3(b)(1), unless the authorization is terminated or revoked sooner.  Performed at [facility], [place] [address], [place], [place] [address]   MRSA PCR Screening     Status: None   Collection Time: 01/02/20  2:21 AM   Specimen: Nasal Mucosa; Nasopharyngeal  Result Value Ref Range Status   MRSA by PCR NEGATIVE NEGATIVE Final    Comment:        The GeneXpert MRSA Assay (FDA approved for NASAL specimens only), is one component of a comprehensive MRSA colonization surveillance program. It is not intended to diagnose MRSA infection nor to guide or monitor treatment for MRSA infections. Performed at [facility], [place] [address] [place], [place] [address]   SARS Coronavirus 2 by RT PCR (hospital order, performed in [facility] hospital lab) Nasopharyngeal Nasopharyngeal Swab     Status: None   Collection Time: 01/05/20 11:39 AM   Specimen: Nasopharyngeal Swab  Result Value Ref Range Status   SARS Coronavirus 2 NEGATIVE NEGATIVE Final    Comment: (NOTE) SARS-CoV-2 target nucleic acids are NOT DETECTED.  The SARS-CoV-2 RNA is generally detectable in upper and lower respiratory specimens during the acute phase of infection.  The lowest concentration of SARS-CoV-2 viral copies this assay can detect is 250 copies / mL. A negative result does not preclude SARS-CoV-2  infection and should not be used as the sole basis for treatment or other patient management decisions.  A negative result may occur with improper specimen collection / handling, submission of specimen other than nasopharyngeal swab, presence of viral mutation(s) within the areas targeted by this assay, and inadequate number of viral copies (<250 copies / mL). A negative result must be combined with clinical observations, patient history, and epidemiological information.  Fact Sheet for Patients:   StrictlyIdeas.no  Fact Sheet for Healthcare Providers: BankingDealers.co.za  This test is not yet approved or  cleared by the [place] FDA and has been authorized for detection and/or diagnosis of SARS-CoV-2 by FDA under an Emergency Use Authorization (EUA).  This EUA will remain in effect (meaning this test can be used) for the duration of the COVID-19 declaration under Section 564(b)(1) of the Act, 21 U.S.C. section 360bbb-3(b)(1), unless the authorization is terminated or revoked sooner.  Performed at [facility], [place] [address] [place], [place] [address]      Labs: BNP (last 3 results) No results for input(s): BNP in the last 8760 hours. Basic Metabolic Panel: Recent Labs  Lab 12/31/19 0001 01/01/20 0500  NA 142 137  K 3.8 3.5  CL 105 103  CO2 26 25  GLUCOSE 86 88  BUN 28* 22  CREATININE 0.67 0.59  CALCIUM 9.2 8.9   Liver Function Tests: No results for input(s): AST, ALT, ALKPHOS, BILITOT, PROT, ALBUMIN in the last 168 hours. No results for input(s): LIPASE, AMYLASE in the last 168 hours. No results for input(s): AMMONIA in the last 168 hours. CBC: Recent Labs  Lab 01/01/20 0500 01/02/20 0227 01/03/20 0308 01/04/20 0338 01/05/20 0224  WBC 5.9 5.4 5.4 5.2 4.5  HGB 10.6* 10.9* 11.0* 11.0* 10.8*  HCT 32.3* 34.2* 33.7* 33.8* 33.4*  MCV 85.4 86.6 84.9 86.2 85.6  PLT 185 184 184 180 172    Cardiac Enzymes: No results for input(s): CKTOTAL, CKMB, CKMBINDEX, TROPONINI in the last 168 hours. BNP: Invalid input(s): POCBNP CBG: Recent Labs  Lab 01/04/20 1957 01/05/20 0001 01/05/20 0432 01/05/20 0723 01/05/20 1151  GLUCAP 125* 109* 92 72 93   D-Dimer No results for input(s): DDIMER in the last 72 hours. Hgb A1c No results for input(s): HGBA1C in the last 72 hours. Lipid Profile No results for input(s): CHOL, HDL, LDLCALC, TRIG, CHOLHDL, LDLDIRECT in the last 72 hours. Thyroid function studies No results for input(s): TSH, T4TOTAL, T3FREE, THYROIDAB in the last 72 hours.  Invalid input(s): FREET3 Anemia work up No results for input(s): VITAMINB12, FOLATE, FERRITIN, TIBC, IRON, RETICCTPCT in the last 72 hours. Urinalysis    Component Value Date/Time   COLORURINE STRAW (A) 01/01/2020 0202   APPEARANCEUR CLEAR 01/01/2020 0202   LABSPEC >1.046 (H) 01/01/2020 0202   PHURINE 7.0 01/01/2020 0202   GLUCOSEU NEGATIVE 01/01/2020 0202   HGBUR SMALL (A) 01/01/2020 0202   BILIRUBINUR NEGATIVE 01/01/2020 0202   BILIRUBINUR negative 06/10/2012 1631   KETONESUR NEGATIVE 01/01/2020 0202   PROTEINUR NEGATIVE 01/01/2020 0202   UROBILINOGEN negative 06/10/2012 1631   NITRITE NEGATIVE 01/01/2020 0202   LEUKOCYTESUR NEGATIVE 01/01/2020 0202   Sepsis Labs Invalid input(s): PROCALCITONIN,  WBC,  LACTICIDVEN Microbiology Recent Results (from the past 240 hour(s))  SARS Coronavirus 2 by RT PCR (hospital order, performed in [facility] hospital lab) Nasopharyngeal Nasopharyngeal Swab  Status: None   Collection Time: 01/01/20  3:22 AM   Specimen: Nasopharyngeal Swab  Result Value Ref Range Status   SARS Coronavirus 2 NEGATIVE NEGATIVE Final    Comment: (NOTE) SARS-CoV-2 target nucleic acids are NOT DETECTED.  The SARS-CoV-2 RNA is generally detectable in upper and lower respiratory specimens during the acute phase of infection. The lowest concentration of SARS-CoV-2 viral  copies this assay can detect is 250 copies / mL. A negative result does not preclude SARS-CoV-2 infection and should not be used as the sole basis for treatment or other patient management decisions.  A negative result may occur with improper specimen collection / handling, submission of specimen other than nasopharyngeal swab, presence of viral mutation(s) within the areas targeted by this assay, and inadequate number of viral copies (<250 copies / mL). A negative result must be combined with clinical observations, patient history, and epidemiological information.  Fact Sheet for Patients:   StrictlyIdeas.no  Fact Sheet for Healthcare Providers: BankingDealers.co.za  This test is not yet approved or  cleared by the [place] FDA and has been authorized for detection and/or diagnosis of SARS-CoV-2 by FDA under an Emergency Use Authorization (EUA).  This EUA will remain in effect (meaning this test can be used) for the duration of the COVID-19 declaration under Section 564(b)(1) of the Act, 21 U.S.C. section 360bbb-3(b)(1), unless the authorization is terminated or revoked sooner.  Performed at [facility], [place] [address] [place], [place] [address]   MRSA PCR Screening     Status: None   Collection Time: 01/02/20  2:21 AM   Specimen: Nasal Mucosa; Nasopharyngeal  Result Value Ref Range Status   MRSA by PCR NEGATIVE NEGATIVE Final    Comment:        The GeneXpert MRSA Assay (FDA approved for NASAL specimens only), is one component of a comprehensive MRSA colonization surveillance program. It is not intended to diagnose MRSA infection nor to guide or monitor treatment for MRSA infections. Performed at [facility], [place] [address], [place],  [address]   SARS Coronavirus 2 by RT PCR (hospital order, performed in [facility] hospital lab) Nasopharyngeal Nasopharyngeal Swab      Status: None   Collection Time: 01/05/20 11:39 AM   Specimen: Nasopharyngeal Swab  Result Value Ref Range Status   SARS Coronavirus 2 NEGATIVE NEGATIVE Final    Comment: (NOTE) SARS-CoV-2 target nucleic acids are NOT DETECTED.  The SARS-CoV-2 RNA is generally detectable in upper and lower respiratory specimens during the acute phase of infection. The lowest concentration of SARS-CoV-2 viral copies this assay can detect is 250 copies / mL. A negative result does not preclude SARS-CoV-2 infection and should not be used as the sole basis for treatment or other patient management decisions.  A negative result may occur with improper specimen collection / handling, submission of specimen other than nasopharyngeal swab, presence of viral mutation(s) within the areas targeted by this assay, and inadequate number of viral copies (<250 copies / mL). A negative result must be combined with clinical observations, patient history, and epidemiological information.  Fact Sheet for Patients:   StrictlyIdeas.no  Fact Sheet for Healthcare Providers: BankingDealers.co.za  This test is not yet approved or  cleared by the [place] FDA and has been authorized for detection and/or diagnosis of SARS-CoV-2 by FDA under an Emergency Use Authorization (EUA).  This EUA will remain in effect (meaning this test can be used) for the duration of the COVID-19 declaration  under Section 564(b)(1) of the Act, 21 U.S.C. section 360bbb-3(b)(1), unless the authorization is terminated or revoked sooner.  Performed at [facility], [place] [address], [place], [place] [address]      Time coordinating discharge:  39 minutes  SIGNED:   Georgette Shell, MD  [facility] 01/05/2020, 2:35 PM Pager   If 7PM-7AM, please contact night-coverage [email or web address] Password [email or web address]

## 2020-01-05 NOTE — Plan of Care (Signed)
  Problem: Pain Managment: Goal: General experience of comfort will improve Outcome: Progressing   

## 2020-01-05 NOTE — TOC Transition Note (Signed)
Transition of Care [facility]) - CM/SW Discharge Note   Patient Details  Name: Stacy Davis MRN: [id number] Date of Birth: 1945/08/20  Transition of Care [facility]) CM/SW Contact:  Lia Hopping, [place] Phone Number: 01/05/2020, 12:09 PM   Clinical Narrative:    HTA approved rehab stay [phone] PASRR# received.  CSW notified the patient son Merry Proud. CSW provided the list of SNF bed offers and inform him of their medicare ratings. Patient son agreeable to [facility]. CSW reached out to resident Coordinator at memory and provided an update. The facility is aware the pt. Will SNF transfer to SNF. The resident coordinator will follow the patient at SNF and make arrangements for the patient upon returning. They are hoping the patient will get to the point she can stand with 2 person assist.   covid test pending discharge.   Final next level of care: Skilled Nursing Facility Barriers to Discharge: Other (comment) (Covid test)   Patient Goals and CMS Choice   CMS Medicare.gov Compare Post Acute Care list provided to:: Other (Comment Required) (Son) Choice offered to / list presented to : Adult Children  Discharge Placement PASRR number recieved: 01/04/20 ([id number])            Patient chooses bed at:  ([place]) Patient to be transferred to facility by: [place] Name of family member notified: Mosetta Anis Patient and family notified of of transfer: 01/05/20  Discharge Plan and Services     Post Acute Care Choice: [place]          DME Arranged: N/A DME Agency: NA       HH Arranged: NA HH Agency: NA        Social Determinants of Health (SDOH) Interventions     Readmission Risk Interventions No flowsheet data found.

## 2020-01-05 NOTE — TOC Transition Note (Signed)
Transition of Care [facility]) - CM/SW Discharge Note   Patient Details  Name: Stacy Davis MRN: [id number] Date of Birth: [birth year]  Transition of Care [facility]) CM/SW Contact:  Lia Hopping, [place] Phone Number: 01/05/2020, 3:46 PM   Clinical Narrative:    Facility [facility] ready to accept the patient.  Nurse call report to: [place] called to transport.  CSW notified the patient son Merry Proud.   Final next level of care: Skilled Nursing Facility Barriers to Discharge: Other (comment) (Covid test)   Patient Goals and CMS Choice   CMS Medicare.gov Compare Post Acute Care list provided to:: Other (Comment Required) (Son) Choice offered to / list presented to : Adult Children  Discharge Placement PASRR number recieved: 01/04/20 ([id number] H)            Patient chooses bed at:  ([place]) Patient to be transferred to facility by: [place] Name of family member notified: Mosetta Anis Patient and family notified of of transfer: 01/05/20  Discharge Plan and Services     Post Acute Care Choice: [place]          DME Arranged: N/A DME Agency: NA       HH Arranged: NA HH Agency: NA        Social Determinants of Health (SDOH) Interventions     Readmission Risk Interventions No flowsheet data found.

## 2020-01-07 DIAGNOSIS — E039 Hypothyroidism, unspecified: Secondary | ICD-10-CM | POA: Diagnosis not present

## 2020-01-07 DIAGNOSIS — F0390 Unspecified dementia without behavioral disturbance: Secondary | ICD-10-CM | POA: Diagnosis not present

## 2020-01-07 DIAGNOSIS — R413 Other amnesia: Secondary | ICD-10-CM | POA: Diagnosis not present

## 2020-01-07 DIAGNOSIS — I10 Essential (primary) hypertension: Secondary | ICD-10-CM | POA: Diagnosis not present

## 2020-01-07 DIAGNOSIS — D649 Anemia, unspecified: Secondary | ICD-10-CM | POA: Diagnosis not present

## 2020-01-07 DIAGNOSIS — S2241XD Multiple fractures of ribs, right side, subsequent encounter for fracture with routine healing: Secondary | ICD-10-CM | POA: Diagnosis not present

## 2020-01-07 DIAGNOSIS — S72101D Unspecified trochanteric fracture of right femur, subsequent encounter for closed fracture with routine healing: Secondary | ICD-10-CM | POA: Diagnosis not present

## 2020-01-07 DIAGNOSIS — D680 Von Willebrand's disease: Secondary | ICD-10-CM | POA: Diagnosis not present

## 2020-01-08 DIAGNOSIS — M6281 Muscle weakness (generalized): Secondary | ICD-10-CM | POA: Diagnosis not present

## 2020-01-12 DIAGNOSIS — F0390 Unspecified dementia without behavioral disturbance: Secondary | ICD-10-CM | POA: Diagnosis not present

## 2020-01-12 DIAGNOSIS — S2241XD Multiple fractures of ribs, right side, subsequent encounter for fracture with routine healing: Secondary | ICD-10-CM | POA: Diagnosis not present

## 2020-01-12 DIAGNOSIS — S72101D Unspecified trochanteric fracture of right femur, subsequent encounter for closed fracture with routine healing: Secondary | ICD-10-CM | POA: Diagnosis not present

## 2020-01-17 DIAGNOSIS — S32039S Unspecified fracture of third lumbar vertebra, sequela: Secondary | ICD-10-CM | POA: Diagnosis not present

## 2020-01-17 DIAGNOSIS — R531 Weakness: Secondary | ICD-10-CM | POA: Diagnosis not present

## 2020-01-17 DIAGNOSIS — E039 Hypothyroidism, unspecified: Secondary | ICD-10-CM | POA: Diagnosis not present

## 2020-01-17 DIAGNOSIS — S72101D Unspecified trochanteric fracture of right femur, subsequent encounter for closed fracture with routine healing: Secondary | ICD-10-CM | POA: Diagnosis not present

## 2020-01-17 DIAGNOSIS — D649 Anemia, unspecified: Secondary | ICD-10-CM | POA: Diagnosis not present

## 2020-01-17 DIAGNOSIS — F0390 Unspecified dementia without behavioral disturbance: Secondary | ICD-10-CM | POA: Diagnosis not present

## 2020-01-17 DIAGNOSIS — S72001D Fracture of unspecified part of neck of right femur, subsequent encounter for closed fracture with routine healing: Secondary | ICD-10-CM | POA: Diagnosis not present

## 2020-01-17 DIAGNOSIS — I10 Essential (primary) hypertension: Secondary | ICD-10-CM | POA: Diagnosis not present

## 2020-01-19 DIAGNOSIS — G309 Alzheimer's disease, unspecified: Secondary | ICD-10-CM | POA: Diagnosis not present

## 2020-01-19 DIAGNOSIS — S2241XA Multiple fractures of ribs, right side, initial encounter for closed fracture: Secondary | ICD-10-CM | POA: Diagnosis not present

## 2020-01-19 DIAGNOSIS — S52381D Bent bone of right radius, subsequent encounter for closed fracture with routine healing: Secondary | ICD-10-CM | POA: Diagnosis not present

## 2020-01-19 DIAGNOSIS — S72114A Nondisplaced fracture of greater trochanter of right femur, initial encounter for closed fracture: Secondary | ICD-10-CM | POA: Diagnosis not present

## 2020-01-19 DIAGNOSIS — D680 Von Willebrand's disease: Secondary | ICD-10-CM | POA: Diagnosis not present

## 2020-01-25 DIAGNOSIS — S2241XD Multiple fractures of ribs, right side, subsequent encounter for fracture with routine healing: Secondary | ICD-10-CM | POA: Diagnosis not present

## 2020-01-25 DIAGNOSIS — Z7982 Long term (current) use of aspirin: Secondary | ICD-10-CM | POA: Diagnosis not present

## 2020-01-25 DIAGNOSIS — E119 Type 2 diabetes mellitus without complications: Secondary | ICD-10-CM | POA: Diagnosis not present

## 2020-01-25 DIAGNOSIS — F0280 Dementia in other diseases classified elsewhere without behavioral disturbance: Secondary | ICD-10-CM | POA: Diagnosis not present

## 2020-01-25 DIAGNOSIS — F329 Major depressive disorder, single episode, unspecified: Secondary | ICD-10-CM | POA: Diagnosis not present

## 2020-01-25 DIAGNOSIS — S72001D Fracture of unspecified part of neck of right femur, subsequent encounter for closed fracture with routine healing: Secondary | ICD-10-CM | POA: Diagnosis not present

## 2020-01-25 DIAGNOSIS — Z9181 History of falling: Secondary | ICD-10-CM | POA: Diagnosis not present

## 2020-01-25 DIAGNOSIS — Z993 Dependence on wheelchair: Secondary | ICD-10-CM | POA: Diagnosis not present

## 2020-01-25 DIAGNOSIS — E039 Hypothyroidism, unspecified: Secondary | ICD-10-CM | POA: Diagnosis not present

## 2020-01-25 DIAGNOSIS — R32 Unspecified urinary incontinence: Secondary | ICD-10-CM | POA: Diagnosis not present

## 2020-01-25 DIAGNOSIS — E7800 Pure hypercholesterolemia, unspecified: Secondary | ICD-10-CM | POA: Diagnosis not present

## 2020-01-25 DIAGNOSIS — G309 Alzheimer's disease, unspecified: Secondary | ICD-10-CM | POA: Diagnosis not present

## 2020-01-25 DIAGNOSIS — I10 Essential (primary) hypertension: Secondary | ICD-10-CM | POA: Diagnosis not present

## 2020-01-25 DIAGNOSIS — K219 Gastro-esophageal reflux disease without esophagitis: Secondary | ICD-10-CM | POA: Diagnosis not present

## 2020-01-25 DIAGNOSIS — R569 Unspecified convulsions: Secondary | ICD-10-CM | POA: Diagnosis not present

## 2020-01-26 DIAGNOSIS — I10 Essential (primary) hypertension: Secondary | ICD-10-CM | POA: Diagnosis not present

## 2020-01-26 DIAGNOSIS — H00019 Hordeolum externum unspecified eye, unspecified eyelid: Secondary | ICD-10-CM | POA: Diagnosis not present

## 2020-01-26 DIAGNOSIS — S2241XD Multiple fractures of ribs, right side, subsequent encounter for fracture with routine healing: Secondary | ICD-10-CM | POA: Diagnosis not present

## 2020-01-26 DIAGNOSIS — F329 Major depressive disorder, single episode, unspecified: Secondary | ICD-10-CM | POA: Diagnosis not present

## 2020-01-26 DIAGNOSIS — S72001D Fracture of unspecified part of neck of right femur, subsequent encounter for closed fracture with routine healing: Secondary | ICD-10-CM | POA: Diagnosis not present

## 2020-01-26 DIAGNOSIS — S52382S Bent bone of left radius, sequela: Secondary | ICD-10-CM | POA: Diagnosis not present

## 2020-01-26 DIAGNOSIS — E7800 Pure hypercholesterolemia, unspecified: Secondary | ICD-10-CM | POA: Diagnosis not present

## 2020-01-26 DIAGNOSIS — E039 Hypothyroidism, unspecified: Secondary | ICD-10-CM | POA: Diagnosis not present

## 2020-01-26 DIAGNOSIS — F0280 Dementia in other diseases classified elsewhere without behavioral disturbance: Secondary | ICD-10-CM | POA: Diagnosis not present

## 2020-01-26 DIAGNOSIS — E119 Type 2 diabetes mellitus without complications: Secondary | ICD-10-CM | POA: Diagnosis not present

## 2020-01-26 DIAGNOSIS — Z7982 Long term (current) use of aspirin: Secondary | ICD-10-CM | POA: Diagnosis not present

## 2020-01-26 DIAGNOSIS — Z993 Dependence on wheelchair: Secondary | ICD-10-CM | POA: Diagnosis not present

## 2020-01-26 DIAGNOSIS — K219 Gastro-esophageal reflux disease without esophagitis: Secondary | ICD-10-CM | POA: Diagnosis not present

## 2020-01-26 DIAGNOSIS — R32 Unspecified urinary incontinence: Secondary | ICD-10-CM | POA: Diagnosis not present

## 2020-01-26 DIAGNOSIS — G309 Alzheimer's disease, unspecified: Secondary | ICD-10-CM | POA: Diagnosis not present

## 2020-01-26 DIAGNOSIS — R569 Unspecified convulsions: Secondary | ICD-10-CM | POA: Diagnosis not present

## 2020-01-26 DIAGNOSIS — Z9181 History of falling: Secondary | ICD-10-CM | POA: Diagnosis not present

## 2020-01-31 DIAGNOSIS — S2241XD Multiple fractures of ribs, right side, subsequent encounter for fracture with routine healing: Secondary | ICD-10-CM | POA: Diagnosis not present

## 2020-01-31 DIAGNOSIS — R32 Unspecified urinary incontinence: Secondary | ICD-10-CM | POA: Diagnosis not present

## 2020-01-31 DIAGNOSIS — E7800 Pure hypercholesterolemia, unspecified: Secondary | ICD-10-CM | POA: Diagnosis not present

## 2020-01-31 DIAGNOSIS — G309 Alzheimer's disease, unspecified: Secondary | ICD-10-CM | POA: Diagnosis not present

## 2020-01-31 DIAGNOSIS — F0280 Dementia in other diseases classified elsewhere without behavioral disturbance: Secondary | ICD-10-CM | POA: Diagnosis not present

## 2020-01-31 DIAGNOSIS — S72001D Fracture of unspecified part of neck of right femur, subsequent encounter for closed fracture with routine healing: Secondary | ICD-10-CM | POA: Diagnosis not present

## 2020-01-31 DIAGNOSIS — E039 Hypothyroidism, unspecified: Secondary | ICD-10-CM | POA: Diagnosis not present

## 2020-01-31 DIAGNOSIS — Z7982 Long term (current) use of aspirin: Secondary | ICD-10-CM | POA: Diagnosis not present

## 2020-01-31 DIAGNOSIS — Z993 Dependence on wheelchair: Secondary | ICD-10-CM | POA: Diagnosis not present

## 2020-01-31 DIAGNOSIS — E119 Type 2 diabetes mellitus without complications: Secondary | ICD-10-CM | POA: Diagnosis not present

## 2020-01-31 DIAGNOSIS — I10 Essential (primary) hypertension: Secondary | ICD-10-CM | POA: Diagnosis not present

## 2020-01-31 DIAGNOSIS — K219 Gastro-esophageal reflux disease without esophagitis: Secondary | ICD-10-CM | POA: Diagnosis not present

## 2020-01-31 DIAGNOSIS — R569 Unspecified convulsions: Secondary | ICD-10-CM | POA: Diagnosis not present

## 2020-01-31 DIAGNOSIS — Z9181 History of falling: Secondary | ICD-10-CM | POA: Diagnosis not present

## 2020-01-31 DIAGNOSIS — F329 Major depressive disorder, single episode, unspecified: Secondary | ICD-10-CM | POA: Diagnosis not present

## 2020-02-08 DIAGNOSIS — M25551 Pain in right hip: Secondary | ICD-10-CM | POA: Diagnosis not present

## 2020-02-08 DIAGNOSIS — M6281 Muscle weakness (generalized): Secondary | ICD-10-CM | POA: Diagnosis not present

## 2020-02-08 DIAGNOSIS — M25531 Pain in right wrist: Secondary | ICD-10-CM | POA: Diagnosis not present

## 2020-02-09 DIAGNOSIS — B373 Candidiasis of vulva and vagina: Secondary | ICD-10-CM | POA: Diagnosis not present

## 2020-02-09 DIAGNOSIS — G309 Alzheimer's disease, unspecified: Secondary | ICD-10-CM | POA: Diagnosis not present

## 2020-02-09 DIAGNOSIS — S52382D Bent bone of left radius, subsequent encounter for closed fracture with routine healing: Secondary | ICD-10-CM | POA: Diagnosis not present

## 2020-02-14 DIAGNOSIS — R451 Restlessness and agitation: Secondary | ICD-10-CM | POA: Diagnosis not present

## 2020-02-14 DIAGNOSIS — F0391 Unspecified dementia with behavioral disturbance: Secondary | ICD-10-CM | POA: Diagnosis not present

## 2020-02-14 DIAGNOSIS — F419 Anxiety disorder, unspecified: Secondary | ICD-10-CM | POA: Diagnosis not present

## 2020-02-14 DIAGNOSIS — F5102 Adjustment insomnia: Secondary | ICD-10-CM | POA: Diagnosis not present

## 2020-02-14 DIAGNOSIS — R454 Irritability and anger: Secondary | ICD-10-CM | POA: Diagnosis not present

## 2020-02-16 ENCOUNTER — Other Ambulatory Visit: Payer: Self-pay

## 2020-02-16 ENCOUNTER — Emergency Department (HOSPITAL_COMMUNITY)
Admission: EM | Admit: 2020-02-16 | Discharge: 2020-02-16 | Disposition: A | Discharge status: Home / Self Care | Payer: PPO | Attending: Emergency Medicine | Admitting: Emergency Medicine

## 2020-02-16 DIAGNOSIS — E119 Type 2 diabetes mellitus without complications: Secondary | ICD-10-CM | POA: Insufficient documentation

## 2020-02-16 DIAGNOSIS — W1839XA Other fall on same level, initial encounter: Secondary | ICD-10-CM | POA: Diagnosis not present

## 2020-02-16 DIAGNOSIS — I10 Essential (primary) hypertension: Secondary | ICD-10-CM | POA: Insufficient documentation

## 2020-02-16 DIAGNOSIS — Z79890 Hormone replacement therapy (postmenopausal): Secondary | ICD-10-CM | POA: Insufficient documentation

## 2020-02-16 DIAGNOSIS — W19XXXA Unspecified fall, initial encounter: Secondary | ICD-10-CM

## 2020-02-16 DIAGNOSIS — Z7982 Long term (current) use of aspirin: Secondary | ICD-10-CM | POA: Diagnosis not present

## 2020-02-16 DIAGNOSIS — F0280 Dementia in other diseases classified elsewhere without behavioral disturbance: Secondary | ICD-10-CM | POA: Diagnosis not present

## 2020-02-16 DIAGNOSIS — R404 Transient alteration of awareness: Secondary | ICD-10-CM | POA: Diagnosis not present

## 2020-02-16 DIAGNOSIS — E039 Hypothyroidism, unspecified: Secondary | ICD-10-CM | POA: Insufficient documentation

## 2020-02-16 DIAGNOSIS — F0390 Unspecified dementia without behavioral disturbance: Secondary | ICD-10-CM | POA: Diagnosis not present

## 2020-02-16 DIAGNOSIS — Z043 Encounter for examination and observation following other accident: Secondary | ICD-10-CM | POA: Diagnosis not present

## 2020-02-16 NOTE — ED Notes (Signed)
Attempted to call [facility] place with no answer.

## 2020-02-16 NOTE — ED Notes (Signed)
[facility] called for transport.  

## 2020-02-16 NOTE — ED Provider Notes (Signed)
[place] DEPT Provider Note   CSN: [id number] Arrival date & time: 02/16/20  1422     History Chief Complaint  Patient presents with  . Fall    Stacy Davis is a 74 y.o. female.  Patient is a 74 year old female with a history of dementia who lives in a skilled facility, diabetes, hyperlipidemia, hypertension, hyperthyroidism and von Willebrand's disease who is presenting today with paramedics due to an unwitnessed fall at her facility.  Facility reported that patient was at her baseline which is ANO x1.  There were no apparent injuries and no specific complaints of pain.  EMS initially placed her in a cervical collar but she did not complain of pain.  The history is provided by the patient, the EMS personnel and the nursing home. The history is limited by the condition of the patient.  Fall This is a recurrent problem.       Past Medical History:  Diagnosis Date  . Anxiety   . Arthritis    ?OA vs Rheum (established with Dr. Jefm Bryant pending blood work)  . Articular cartilage disorder of shoulder 04/2012   right  . Back pain    h/o DDD since 2010  . Basal cell carcinoma    right dorsum nose, right forhead  . Dementia ([place])   . Dementia ([place])   . Dental crowns present    also dental caps  . Diabetes mellitus    NIDDM  . Fecal incontinence   . GERD (gastroesophageal reflux disease)   . Headache(784.0)    occasional  . History of skin cancer   . Hyperlipidemia    no current med.  . Hypertension   . Hyperthyroidism   . Hypothyroid   . Impingement syndrome of right shoulder 04/2012  . Memory impairment    dementia- worsenign 12/2016 with medication non compliance and delusions.    . Orthostatic dizziness   . Right rotator cuff tear 05/15/2012  . Rotator cuff rupture 04/2012   right  . Seizures ([place])   . 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 Von Willebrand disease [facility])     Patient Active Problem  List   Diagnosis Date Noted  . Fall at nursing home   . Palliative care by specialist   . Goals of care, counseling/discussion   . General weakness   . Trochanteric fracture ([place]) 01/01/2020  . Closed rib fracture 01/01/2020  . Anemia 01/01/2020  . Hip fx ([place]) 01/01/2020  . Orthostatic dizziness   . Dementia ([place])   . Seizures ([place]) 06/16/2018  . Subarachnoid hemorrhage ([place])   . Syncope 04/11/2018  . Incontinence of feces 01/08/2018  . Depression 10/08/2017  . Memory impairment   . Advance care planning 10/06/2014  . Joint pain 04/15/2013  . Advance directive on file 08/12/2012  . Confusion 06/10/2012  . Right rotator cuff tear 05/15/2012  . Shoulder pain 05/03/2012  . Insomnia 08/13/2011  . 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 HTN (hypertension) 07/18/2011  . HLD (hyperlipidemia) 07/18/2011  . Memory loss 07/18/2011  . Hypothyroid 07/18/2011  . SVT (supraventricular tachycardia) ([place]) 07/18/2011  . Von Willebrand disease ([place]) 07/18/2011  . GERD (gastroesophageal reflux disease) 07/18/2011  . Back pain 07/18/2011  . Fatty liver 07/18/2011    Past Surgical History:  Procedure Laterality Date  . APPENDECTOMY    . BREAST SURGERY     hematoma evacuation due to trauma  .  CARDIAC CATHETERIZATION  03/06/2001  . CESAREAN SECTION     x 2  . COLONOSCOPY  01/20/2006 per patient  . FRACTURE SURGERY    . KNEE ARTHROSCOPY Right 12/01/2014   Procedure: ARTHROSCOPY KNEE,partial medial menisectomy and plica excision;  Surgeon: Hessie Knows, MD;  Location: [facility] ORS;  Service: Orthopedics;  Laterality: Right;  . LUMBAR LAMINECTOMY/DECOMPRESSION MICRODISCECTOMY  03/01/2009   right L4-5; arthrodesis L4-5  . ORIF WRIST FRACTURE     left  . SHOULDER ARTHROSCOPY WITH ROTATOR CUFF REPAIR AND SUBACROMIAL DECOMPRESSION  05/15/2012   Procedure: SHOULDER ARTHROSCOPY WITH ROTATOR CUFF REPAIR AND SUBACROMIAL DECOMPRESSION;  Surgeon: Johnny Bridge,  MD;  Location: [place];  Service: Orthopedics;  Laterality: Right;  RIGHT ARTHROSCOPY SHOULDER DEBRIDEMENT LIMITED, ARTHROSCOPY SHOULDER DECOMPRESSION SUBACROMIAL PARTIAL ACROMIOPLASTY WITH CORACOACROMIAL RELEASE, ARTHROSCOPY SHOULDER WITH ROTATOR CUFF REPAIR  . TUBAL LIGATION       OB History   No obstetric history on file.     Family History  Problem Relation Age of Onset  . Dementia Mother   . Arthritis Mother   . Cancer Father        unknown primary  . Heart disease Father   . Thyroid disease Sister   . Cancer Sister        brain tumor  . Dementia Sister   . COPD Brother   . Cancer Brother        bone cancer  . Colon cancer Paternal Grandfather   . Breast cancer Neg Hx     Social History   Tobacco Use  . Smoking status: Never Smoker  . Smokeless tobacco: Never Used  Vaping Use  . Vaping Use: Never used  Substance Use Topics  . Alcohol use: No    Alcohol/week: 0.0 standard drinks  . Drug use: No    Home Medications Prior to Admission medications   Medication Sig Start Date End Date Taking? Authorizing Provider  aspirin EC 325 MG tablet Take 1 tablet (325 mg total) by mouth 2 (two) times daily. 01/04/20 01/03/21  Shahmehdi, Valeria Batman, MD  ketorolac (TORADOL) 10 MG tablet Take 1 tablet (10 mg total) by mouth every 6 (six) hours as needed for moderate pain or severe pain. 01/03/20   Shahmehdi, Valeria Batman, MD  levETIRAcetam (KEPPRA XR) 500 MG 24 hr tablet Take 2 tablets (1,000 mg total) by mouth at bedtime AND 1 tablet (500 mg total) in the morning. 11/01/19   Dorie Rank, MD  levothyroxine (SYNTHROID) 88 MCG tablet TAKE 1 TABLET BY MOUTH ONCE A DAY Patient taking differently: Take 88 mcg by mouth daily.  10/05/18   Susy Frizzle, MD  LORazepam (ATIVAN) 0.5 MG tablet Take 0.5 mg by mouth 2 (two) times daily as needed for anxiety.    [provider]  melatonin 3 MG TABS tablet Take 3 mg by mouth at bedtime.    [provider]  omeprazole  (PRILOSEC) 20 MG capsule Take 1 capsule (20 mg total) by mouth 2 (two) times daily. 01/04/20 01/03/21  Shahmehdi, Valeria Batman, MD  QUEtiapine (SEROQUEL) 25 MG tablet Take 1 tablet (25 mg total) by mouth at bedtime. 05/05/19   Marcial Pacas, MD  sertraline (ZOLOFT) 50 MG tablet Take 1 tablet (50 mg total) by mouth daily. 05/05/19   Marcial Pacas, MD    Allergies    Tramadol  Review of Systems   Review of Systems  Unable to perform ROS: Dementia    Physical Exam Updated Vital Signs  BP 107/77 (BP Location: Right Arm)   Pulse 87   Temp 98.1 F (36.7 C) (Oral)   Resp 18   SpO2 98%   Physical Exam Vitals and nursing note reviewed.  Constitutional:      General: She is not in acute distress.    Appearance: Normal appearance. She is well-developed and underweight.  HENT:     Head: Normocephalic and atraumatic.     Comments: No area or trauma identified Eyes:     Pupils: Pupils are equal, round, and reactive to light.  Cardiovascular:     Rate and Rhythm: Normal rate and regular rhythm.     Heart sounds: Normal heart sounds. No murmur heard.  No friction rub.  Pulmonary:     Effort: Pulmonary effort is normal.     Breath sounds: Normal breath sounds. No wheezing or rales.  Abdominal:     General: Bowel sounds are normal. There is no distension.     Palpations: Abdomen is soft.     Tenderness: There is no abdominal tenderness. There is no guarding or rebound.  Musculoskeletal:        General: No tenderness. Normal range of motion.     Cervical back: Normal range of motion and neck supple. No tenderness.     Comments: No edema.  No lumbar or thoracic tenderness.  Full ROM of shoulders, elbows, wrists, hips and knees bilaterally.  Skin:    General: Skin is warm and dry.     Findings: No rash.  Neurological:     Mental Status: She is alert. Mental status is at baseline.     Sensory: No sensory deficit.     Motor: No weakness.     Comments: Oriented to person  Psychiatric:     Comments:  Cooperative.  Intermittently tearful and reports she is scared     ED Results / Procedures / Treatments   Labs (all labs ordered are listed, but only abnormal results are displayed) Labs Reviewed - No data to display  EKG None  Radiology No results found.  Procedures Procedures (including critical care time)  Medications Ordered in ED Medications - No data to display  ED Course  I have reviewed the triage vital signs and the nursing notes.  Pertinent labs & imaging results that were available during my care of the patient were reviewed by me and considered in my medical decision making (see chart for details).    MDM Rules/Calculators/A&P                          Elderly patient with dementia who presents today after an unwitnessed fall.  She denies any areas of pain.  No signs of injury on exam and pt at baseline mental status.  She has aspirin but no other anticoagulation. Pt was d/ced home as there is no evidence of injury.  Final Clinical Impression(s) / ED Diagnoses Final diagnoses:  Fall, initial encounter    Rx / DC Orders ED Discharge Orders    None       Blanchie Dessert, MD 02/16/20 1551

## 2020-02-16 NOTE — ED Triage Notes (Signed)
Pt BIBA from [facility]-  Per EMS- Pt had unwitnessed fall today.  No obvious injuries noted. AOx1, reported as pts baseline.   Arrives in c-collar.

## 2020-02-16 NOTE — Discharge Instructions (Signed)
Exam is normal and no signs of injury.  Safe for discharge.  Tylenol 500mg  every 6 hours as needed for pain.

## 2020-02-22 DIAGNOSIS — F0391 Unspecified dementia with behavioral disturbance: Secondary | ICD-10-CM | POA: Diagnosis not present

## 2020-02-22 DIAGNOSIS — F419 Anxiety disorder, unspecified: Secondary | ICD-10-CM | POA: Diagnosis not present

## 2020-02-22 DIAGNOSIS — F5102 Adjustment insomnia: Secondary | ICD-10-CM | POA: Diagnosis not present

## 2020-02-22 DIAGNOSIS — R451 Restlessness and agitation: Secondary | ICD-10-CM | POA: Diagnosis not present

## 2020-02-22 DIAGNOSIS — R454 Irritability and anger: Secondary | ICD-10-CM | POA: Diagnosis not present

## 2020-02-23 DIAGNOSIS — G309 Alzheimer's disease, unspecified: Secondary | ICD-10-CM | POA: Diagnosis not present

## 2020-02-23 DIAGNOSIS — M6281 Muscle weakness (generalized): Secondary | ICD-10-CM | POA: Diagnosis not present

## 2020-02-23 DIAGNOSIS — W010XXA Fall on same level from slipping, tripping and stumbling without subsequent striking against object, initial encounter: Secondary | ICD-10-CM | POA: Diagnosis not present

## 2020-02-29 ENCOUNTER — Encounter (HOSPITAL_COMMUNITY): Payer: Self-pay

## 2020-02-29 ENCOUNTER — Emergency Department (HOSPITAL_COMMUNITY): Payer: PPO

## 2020-02-29 ENCOUNTER — Other Ambulatory Visit: Payer: Self-pay

## 2020-02-29 ENCOUNTER — Emergency Department (HOSPITAL_COMMUNITY)
Admission: EM | Admit: 2020-02-29 | Discharge: 2020-02-29 | Disposition: A | Discharge status: Home / Self Care | Payer: PPO | Attending: Emergency Medicine | Admitting: Emergency Medicine

## 2020-02-29 DIAGNOSIS — Z7982 Long term (current) use of aspirin: Secondary | ICD-10-CM | POA: Insufficient documentation

## 2020-02-29 DIAGNOSIS — Z85828 Personal history of other malignant neoplasm of skin: Secondary | ICD-10-CM | POA: Insufficient documentation

## 2020-02-29 DIAGNOSIS — F0390 Unspecified dementia without behavioral disturbance: Secondary | ICD-10-CM | POA: Diagnosis not present

## 2020-02-29 DIAGNOSIS — S72111D Displaced fracture of greater trochanter of right femur, subsequent encounter for closed fracture with routine healing: Secondary | ICD-10-CM | POA: Diagnosis not present

## 2020-02-29 DIAGNOSIS — E1169 Type 2 diabetes mellitus with other specified complication: Secondary | ICD-10-CM | POA: Insufficient documentation

## 2020-02-29 DIAGNOSIS — R404 Transient alteration of awareness: Secondary | ICD-10-CM | POA: Diagnosis not present

## 2020-02-29 DIAGNOSIS — Z79899 Other long term (current) drug therapy: Secondary | ICD-10-CM | POA: Diagnosis not present

## 2020-02-29 DIAGNOSIS — R5381 Other malaise: Secondary | ICD-10-CM | POA: Diagnosis not present

## 2020-02-29 DIAGNOSIS — E039 Hypothyroidism, unspecified: Secondary | ICD-10-CM | POA: Insufficient documentation

## 2020-02-29 DIAGNOSIS — S72111A Displaced fracture of greater trochanter of right femur, initial encounter for closed fracture: Secondary | ICD-10-CM | POA: Diagnosis not present

## 2020-02-29 DIAGNOSIS — E1129 Type 2 diabetes mellitus with other diabetic kidney complication: Secondary | ICD-10-CM | POA: Diagnosis not present

## 2020-02-29 DIAGNOSIS — W050XXA Fall from non-moving wheelchair, initial encounter: Secondary | ICD-10-CM | POA: Diagnosis not present

## 2020-02-29 DIAGNOSIS — R279 Unspecified lack of coordination: Secondary | ICD-10-CM | POA: Diagnosis not present

## 2020-02-29 DIAGNOSIS — F29 Unspecified psychosis not due to a substance or known physiological condition: Secondary | ICD-10-CM | POA: Diagnosis not present

## 2020-02-29 DIAGNOSIS — Z043 Encounter for examination and observation following other accident: Secondary | ICD-10-CM | POA: Diagnosis not present

## 2020-02-29 DIAGNOSIS — E785 Hyperlipidemia, unspecified: Secondary | ICD-10-CM | POA: Diagnosis not present

## 2020-02-29 DIAGNOSIS — I10 Essential (primary) hypertension: Secondary | ICD-10-CM | POA: Insufficient documentation

## 2020-02-29 DIAGNOSIS — Z86008 Personal history of in-situ neoplasm of other site: Secondary | ICD-10-CM | POA: Diagnosis not present

## 2020-02-29 DIAGNOSIS — R410 Disorientation, unspecified: Secondary | ICD-10-CM | POA: Diagnosis not present

## 2020-02-29 DIAGNOSIS — W19XXXA Unspecified fall, initial encounter: Secondary | ICD-10-CM | POA: Diagnosis not present

## 2020-02-29 DIAGNOSIS — Z743 Need for continuous supervision: Secondary | ICD-10-CM | POA: Diagnosis not present

## 2020-02-29 DIAGNOSIS — R079 Chest pain, unspecified: Secondary | ICD-10-CM | POA: Diagnosis not present

## 2020-02-29 DIAGNOSIS — S72111G Displaced fracture of greater trochanter of right femur, subsequent encounter for closed fracture with delayed healing: Secondary | ICD-10-CM

## 2020-02-29 DIAGNOSIS — R451 Restlessness and agitation: Secondary | ICD-10-CM | POA: Diagnosis not present

## 2020-02-29 DIAGNOSIS — S72112A Displaced fracture of greater trochanter of left femur, initial encounter for closed fracture: Secondary | ICD-10-CM | POA: Diagnosis not present

## 2020-02-29 NOTE — ED Triage Notes (Signed)
Pt transported from [facility] to [facility] ED and reports the following:  [employer] said pt fell out of wheelchair and landed on her left side. Unwitnessed fall.  Initially, she stated her left side hurt, then said it did not hurt. Hx dementia.

## 2020-02-29 NOTE — ED Notes (Addendum)
Called report to [facility]. Spoke with Essie. Confirmed the patient does not walk and she was not sent to [facility] with a walker.

## 2020-02-29 NOTE — ED Provider Notes (Signed)
[facility] DEPT Provider Note   CSN: [id number] Arrival date & time: 02/29/20  1502     History Chief Complaint  Patient presents with  . Fall    Stacy Davis is a 74 y.o. female.  Level 5 caveat secondary to dementia.  Patient is a resident at a long-term care facility.  She is here after an unwitnessed fall out of her wheelchair.  History of falls.  Baseline A&O x1 and agitated.  EMS said no obvious signs of trauma.  Not on anticoagulation other than an aspirin.  Patient herself cannot give any history. The history is provided by the patient and the EMS personnel.  Fall This is a recurrent problem. The current episode started 1 to 2 hours ago. The problem has not changed since onset.Pertinent negatives include no chest pain, no abdominal pain, no headaches and no shortness of breath. Nothing aggravates the symptoms. Nothing relieves the symptoms. She has tried nothing for the symptoms. The treatment provided no relief.       Past Medical History:  Diagnosis Date  . Anxiety   . Arthritis    ?OA vs Rheum (established with Dr. Jefm Bryant pending blood work)  . Articular cartilage disorder of shoulder 04/2012   right  . Back pain    h/o DDD since 2010  . Basal cell carcinoma    right dorsum nose, right forhead  . Dementia ([place])   . Dementia ([place])   . Dental crowns present    also dental caps  . Diabetes mellitus    NIDDM  . Fecal incontinence   . GERD (gastroesophageal reflux disease)   . Headache(784.0)    occasional  . History of skin cancer   . Hyperlipidemia    no current med.  . Hypertension   . Hyperthyroidism   . Hypothyroid   . Impingement syndrome of right shoulder 04/2012  . Memory impairment    dementia- worsenign 12/2016 with medication non compliance and delusions.    . Orthostatic dizziness   . Right rotator cuff tear 05/15/2012  . Rotator cuff rupture 04/2012   right  . Seizures ([place])   . 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 Von Willebrand disease [facility])     Patient Active Problem List   Diagnosis Date Noted  . Fall at nursing home   . Palliative care by specialist   . Goals of care, counseling/discussion   . General weakness   . Trochanteric fracture ([place]) 01/01/2020  . Closed rib fracture 01/01/2020  . Anemia 01/01/2020  . Hip fx ([place]) 01/01/2020  . Orthostatic dizziness   . Dementia ([place])   . Seizures ([place]) 06/16/2018  . Subarachnoid hemorrhage ([place])   . Syncope 04/11/2018  . Incontinence of feces 01/08/2018  . Depression 10/08/2017  . Memory impairment   . Advance care planning 10/06/2014  . Joint pain 04/15/2013  . Advance directive on file 08/12/2012  . Confusion 06/10/2012  . Right rotator cuff tear 05/15/2012  . Shoulder pain 05/03/2012  . Insomnia 08/13/2011  . 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 HTN (hypertension) 07/18/2011  . HLD (hyperlipidemia) 07/18/2011  . Memory loss 07/18/2011  . Hypothyroid 07/18/2011  . SVT (supraventricular tachycardia) ([place]) 07/18/2011  . Von Willebrand disease ([place]) 07/18/2011  . GERD (gastroesophageal reflux disease) 07/18/2011  . Back pain 07/18/2011  . Fatty liver 07/18/2011    Past Surgical History:  Procedure Laterality Date  .  APPENDECTOMY    . BREAST SURGERY     hematoma evacuation due to trauma  . CARDIAC CATHETERIZATION  03/06/2001  . CESAREAN SECTION     x 2  . COLONOSCOPY  01/20/2006 per patient  . FRACTURE SURGERY    . KNEE ARTHROSCOPY Right 12/01/2014   Procedure: ARTHROSCOPY KNEE,partial medial menisectomy and plica excision;  Surgeon: Hessie Knows, MD;  Location: [facility] ORS;  Service: Orthopedics;  Laterality: Right;  . LUMBAR LAMINECTOMY/DECOMPRESSION MICRODISCECTOMY  03/01/2009   right L4-5; arthrodesis L4-5  . ORIF WRIST FRACTURE     left  . SHOULDER ARTHROSCOPY WITH ROTATOR CUFF REPAIR AND SUBACROMIAL DECOMPRESSION  05/15/2012   Procedure:  SHOULDER ARTHROSCOPY WITH ROTATOR CUFF REPAIR AND SUBACROMIAL DECOMPRESSION;  Surgeon: Johnny Bridge, MD;  Location: [facility];  Service: Orthopedics;  Laterality: Right;  RIGHT ARTHROSCOPY SHOULDER DEBRIDEMENT LIMITED, ARTHROSCOPY SHOULDER DECOMPRESSION SUBACROMIAL PARTIAL ACROMIOPLASTY WITH CORACOACROMIAL RELEASE, ARTHROSCOPY SHOULDER WITH ROTATOR CUFF REPAIR  . TUBAL LIGATION       OB History   No obstetric history on file.     Family History  Problem Relation Age of Onset  . Dementia Mother   . Arthritis Mother   . Cancer Father        unknown primary  . Heart disease Father   . Thyroid disease Sister   . Cancer Sister        brain tumor  . Dementia Sister   . COPD Brother   . Cancer Brother        bone cancer  . Colon cancer Paternal Grandfather   . Breast cancer Neg Hx     Social History   Tobacco Use  . Smoking status: Never Smoker  . Smokeless tobacco: Never Used  Vaping Use  . Vaping Use: Never used  Substance Use Topics  . Alcohol use: No    Alcohol/week: 0.0 standard drinks  . Drug use: No    Home Medications Prior to Admission medications   Medication Sig Start Date End Date Taking? Authorizing Provider  aspirin EC 325 MG tablet Take 1 tablet (325 mg total) by mouth 2 (two) times daily. 01/04/20 01/03/21  Shahmehdi, Valeria Batman, MD  ketorolac (TORADOL) 10 MG tablet Take 1 tablet (10 mg total) by mouth every 6 (six) hours as needed for moderate pain or severe pain. 01/03/20   Shahmehdi, Valeria Batman, MD  levETIRAcetam (KEPPRA XR) 500 MG 24 hr tablet Take 2 tablets (1,000 mg total) by mouth at bedtime AND 1 tablet (500 mg total) in the morning. 11/01/19   Dorie Rank, MD  levothyroxine (SYNTHROID) 88 MCG tablet TAKE 1 TABLET BY MOUTH ONCE A DAY Patient taking differently: Take 88 mcg by mouth daily.  10/05/18   Susy Frizzle, MD  LORazepam (ATIVAN) 0.5 MG tablet Take 0.5 mg by mouth 2 (two) times daily as needed for anxiety.    [provider]    melatonin 3 MG TABS tablet Take 3 mg by mouth at bedtime.    [provider]  omeprazole (PRILOSEC) 20 MG capsule Take 1 capsule (20 mg total) by mouth 2 (two) times daily. 01/04/20 01/03/21  Shahmehdi, Valeria Batman, MD  QUEtiapine (SEROQUEL) 25 MG tablet Take 1 tablet (25 mg total) by mouth at bedtime. 05/05/19   Marcial Pacas, MD  sertraline (ZOLOFT) 50 MG tablet Take 1 tablet (50 mg total) by mouth daily. 05/05/19   Marcial Pacas, MD    Allergies    Tramadol  Review of Systems  Review of Systems  Unable to perform ROS: Dementia  Respiratory: Negative for shortness of breath.   Cardiovascular: Negative for chest pain.  Gastrointestinal: Negative for abdominal pain.  Neurological: Negative for headaches.    Physical Exam Updated Vital Signs BP 132/84   Pulse 72   Temp 98 F (36.7 C)   Resp 18   SpO2 98%   Physical Exam Vitals and nursing note reviewed.  Constitutional:      General: She is not in acute distress.    Appearance: Normal appearance. She is well-developed.  HENT:     Head: Normocephalic and atraumatic.  Eyes:     Conjunctiva/sclera: Conjunctivae normal.  Neck:     Comments: In cervical collar trach midline Cardiovascular:     Rate and Rhythm: Normal rate and regular rhythm.     Heart sounds: No murmur heard.   Pulmonary:     Effort: Pulmonary effort is normal. No respiratory distress.     Breath sounds: Normal breath sounds.  Abdominal:     Palpations: Abdomen is soft.     Tenderness: There is no abdominal tenderness. There is no guarding or rebound.  Musculoskeletal:        General: No tenderness, deformity or signs of injury. Normal range of motion.     Cervical back: No tenderness.     Comments: Was able to arrange and palpate upper and lower extremities with no obvious focal tenderness or limitations.  Skin:    General: Skin is warm and dry.     Capillary Refill: Capillary refill takes less than 2 seconds.  Neurological:     General: No focal  deficit present.     Mental Status: She is alert. She is disoriented.     Comments: Patient is awake but not answering direct questions.  Moving all extremities without any focal deficits.     ED Results / Procedures / Treatments   Labs (all labs ordered are listed, but only abnormal results are displayed) Labs Reviewed - No data to display  EKG None  Radiology DG Chest 1 View  Result Date: 02/29/2020 CLINICAL DATA:  Recent fall with left-sided pain, initial encounter EXAM: CHEST  1 VIEW COMPARISON:  12/31/2019 FINDINGS: The heart size and mediastinal contours are within normal limits. Both lungs are clear. The visualized skeletal structures are unremarkable. IMPRESSION: No active disease. Electronically Signed   By: Inez Catalina M.D.   On: 02/29/2020 16:22   DG Pelvis 1-2 Views  Result Date: 02/29/2020 CLINICAL DATA:  Recent fall from wheelchair with left-sided pelvic pain, initial encounter EXAM: PELVIS - 1-2 VIEW COMPARISON:  CT from 01/01/2020 FINDINGS: X stop placement is noted at L4-5. previously seen right greater trochanter fracture is again identified but slightly more displaced than that noted on the prior CT. The pelvic ring is intact. No definitive left-sided fracture is seen. Soft tissue density is noted over the left hip likely related to the recent injury. IMPRESSION: Soft tissue hematoma consistent with the recent injury. No acute bony abnormality is noted. There is slight increased displacement of a previously diagnosed right greater trochanter fracture. Electronically Signed   By: Inez Catalina M.D.   On: 02/29/2020 16:21   CT Head Wo Contrast  Result Date: 02/29/2020 CLINICAL DATA:  Fall from wheelchair EXAM: CT HEAD WITHOUT CONTRAST TECHNIQUE: Contiguous axial images were obtained from the base of the skull through the vertex without intravenous contrast. COMPARISON:  12/31/2019 FINDINGS: Brain: There is no acute intracranial hemorrhage, mass effect,  or edema. Gray-white  differentiation is preserved. There is no extra-axial fluid collection. Prominence of ventricles and sulci reflects stable parenchymal volume loss. Patchy hypoattenuation in the supratentorial white matter likely reflects stable chronic microvascular ischemic changes. Vascular: There is atherosclerotic calcification at the skull base. Skull: Calvarium is unremarkable. Sinuses/Orbits: No acute finding. Other: None. IMPRESSION: No evidence of acute intracranial injury. Electronically Signed   By: Macy Mis M.D.   On: 02/29/2020 17:49   CT Cervical Spine Wo Contrast  Result Date: 02/29/2020 CLINICAL DATA:  Fall from wheelchair EXAM: CT CERVICAL SPINE WITHOUT CONTRAST TECHNIQUE: Multidetector CT imaging of the cervical spine was performed without intravenous contrast. Multiplanar CT image reconstructions were also generated. COMPARISON:  12/31/2019 FINDINGS: Motion artifact is present. Alignment: Stable. Skull base and vertebrae: No acute cervical spine fracture. Soft tissues and spinal canal: No prevertebral fluid or swelling. No visible canal hematoma. Disc levels: Advanced multilevel degenerative changes are similar in appearance. Upper chest: No apical lung mass. Other: None. IMPRESSION: No acute cervical spine fracture. Electronically Signed   By: Macy Mis M.D.   On: 02/29/2020 17:54    Procedures Procedures (including critical care time)  Medications Ordered in ED Medications - No data to display  ED Course  I have reviewed the triage vital signs and the nursing notes.  Pertinent labs & imaging results that were available during my care of the patient were reviewed by me and considered in my medical decision making (see chart for details).  Clinical Course as of Mar 02 1023  Tue Feb 29, 2020  1610 Chest x-ray interpreted by me as no pneumothorax no gross infiltrates.  Pelvis x-ray has a lot of artifact awaiting radiology reading.   [MB]  1626 Radiology reading slight increase in  displacement of previously diagnosed right greater trochanteric fracture.  This was first identified in August during another fall and recommendations from Dr. Veverly Fells orthopedics were nonoperative   [MB]    Clinical Course User Index [MB] Hayden Rasmussen, MD   MDM Rules/Calculators/A&P                         74 year old female here by EMS after unwitnessed fall.  On review of her prior records he has multiple ED visits for falls.  In the memory care unit.  No obvious signs of trauma.  Due to her frailty will get a CT head and cervical spine along with chest x-ray and pelvis.  Final Clinical Impression(s) / ED Diagnoses Final diagnoses:  Fall, initial encounter  Displaced fracture of greater trochanter of right femur, subsequent encounter for closed fracture with delayed healing    Rx / DC Orders ED Discharge Orders    None       Hayden Rasmussen, MD 03/01/20 1025

## 2020-02-29 NOTE — Discharge Instructions (Signed)
You were seen in the emergency department for evaluation of injuries after a fall.  You had a CAT scan of your head and cervical spine along with x-rays of your chest and pelvis.  The only significant abnormality was that the prior greater trochanteric fracture on the right hip is still present.  Please follow-up with orthopedics as needed.

## 2020-02-29 NOTE — Progress Notes (Signed)
..   Transition of Care [facility]) - Emergency Department Mini Assessment   Patient Details  Name: Stacy Davis MRN: [id number] Date of Birth: Feb 20, 1946  Transition of [facility]) CM/SW Contact:    Erenest Rasher, RN Phone Number:  [phone] 02/29/2020, 5:43 PM   Clinical Narrative:  TOC CM did contact [facility] ALF/Memory Care. They are able to accept pt back when dc if she is stable to return. Pt cannot have safety belt on wheelchair if she is unable to unfasten on her own. Safety belt is considered a restraint at facility.   ED Mini Assessment: What brought you to the Emergency Department? : fall  Barriers to Discharge: No Barriers Identified     Means of departure: Ambulance       Patient Contact and Communications Key Contact 1: [facility] ALF/Memory Care   Spoke with: [place] Contact Date: 02/29/20,   Contact time: 1742 Contact Phone Number: [phone] Call outcome: States pt is working with PT at the facility. Pt attempts to get out of wheelchair to walk after her therapy sessions.  Patient states their goals for this hospitalization and ongoing recovery are:: ALF/Memory Care are able to accept back if she is stable to dc back      Admission diagnosis:  Fall Patient Active Problem List   Diagnosis Date Noted  . Fall at nursing home   . Palliative care by specialist   . Goals of care, counseling/discussion   . General weakness   . Trochanteric fracture ([place]) 01/01/2020  . Closed rib fracture 01/01/2020  . Anemia 01/01/2020  . Hip fx ([place]) 01/01/2020  . Orthostatic dizziness   . Dementia ([place])   . Seizures ([place]) 06/16/2018  . Subarachnoid hemorrhage ([place])   . Syncope 04/11/2018  . Incontinence of feces 01/08/2018  . Depression 10/08/2017  . Memory impairment   . Advance care planning 10/06/2014  . Joint pain 04/15/2013  . Advance directive on file 08/12/2012  . Confusion 06/10/2012  . Right rotator cuff tear 05/15/2012  . Shoulder pain  05/03/2012  . Insomnia 08/13/2011  . 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 HTN (hypertension) 07/18/2011  . HLD (hyperlipidemia) 07/18/2011  . Memory loss 07/18/2011  . Hypothyroid 07/18/2011  . SVT (supraventricular tachycardia) ([place]) 07/18/2011  . Von Willebrand disease ([place]) 07/18/2011  . GERD (gastroesophageal reflux disease) 07/18/2011  . Back pain 07/18/2011  . Fatty liver 07/18/2011   PCP:  Susy Frizzle, MD Pharmacy:   [facility], [place] [address] Phone: [phone] Fax: [phone]  [facility] [place], [place] - [address] [place] [address] Phone: [phone] Fax: [phone]

## 2020-02-29 NOTE — ED Notes (Addendum)
Called [facility]. Printed [facility] paperwork and placed it in bin on counter.

## 2020-03-01 DIAGNOSIS — G309 Alzheimer's disease, unspecified: Secondary | ICD-10-CM | POA: Diagnosis not present

## 2020-03-01 DIAGNOSIS — R296 Repeated falls: Secondary | ICD-10-CM | POA: Diagnosis not present

## 2020-03-01 DIAGNOSIS — M6281 Muscle weakness (generalized): Secondary | ICD-10-CM | POA: Diagnosis not present

## 2020-03-01 DIAGNOSIS — W010XXA Fall on same level from slipping, tripping and stumbling without subsequent striking against object, initial encounter: Secondary | ICD-10-CM | POA: Diagnosis not present

## 2020-03-09 DIAGNOSIS — M6281 Muscle weakness (generalized): Secondary | ICD-10-CM | POA: Diagnosis not present

## 2020-03-16 DIAGNOSIS — F0391 Unspecified dementia with behavioral disturbance: Secondary | ICD-10-CM | POA: Diagnosis not present

## 2020-03-16 DIAGNOSIS — F419 Anxiety disorder, unspecified: Secondary | ICD-10-CM | POA: Diagnosis not present

## 2020-03-16 DIAGNOSIS — F5102 Adjustment insomnia: Secondary | ICD-10-CM | POA: Diagnosis not present

## 2020-03-16 DIAGNOSIS — R454 Irritability and anger: Secondary | ICD-10-CM | POA: Diagnosis not present

## 2020-03-16 DIAGNOSIS — R451 Restlessness and agitation: Secondary | ICD-10-CM | POA: Diagnosis not present

## 2020-03-21 ENCOUNTER — Emergency Department (HOSPITAL_COMMUNITY)
Admission: EM | Admit: 2020-03-21 | Discharge: 2020-03-21 | Disposition: A | Discharge status: Home / Self Care | Attending: Emergency Medicine | Admitting: Emergency Medicine

## 2020-03-21 ENCOUNTER — Encounter (HOSPITAL_COMMUNITY): Payer: Self-pay | Admitting: Emergency Medicine

## 2020-03-21 ENCOUNTER — Other Ambulatory Visit: Payer: Self-pay

## 2020-03-21 DIAGNOSIS — R4182 Altered mental status, unspecified: Secondary | ICD-10-CM | POA: Insufficient documentation

## 2020-03-21 DIAGNOSIS — Z79899 Other long term (current) drug therapy: Secondary | ICD-10-CM | POA: Diagnosis not present

## 2020-03-21 DIAGNOSIS — F0390 Unspecified dementia without behavioral disturbance: Secondary | ICD-10-CM | POA: Insufficient documentation

## 2020-03-21 DIAGNOSIS — W19XXXA Unspecified fall, initial encounter: Secondary | ICD-10-CM | POA: Diagnosis not present

## 2020-03-21 DIAGNOSIS — Z79890 Hormone replacement therapy (postmenopausal): Secondary | ICD-10-CM | POA: Diagnosis not present

## 2020-03-21 DIAGNOSIS — Z85828 Personal history of other malignant neoplasm of skin: Secondary | ICD-10-CM | POA: Diagnosis not present

## 2020-03-21 DIAGNOSIS — Y92002 Bathroom of unspecified non-institutional (private) residence single-family (private) house as the place of occurrence of the external cause: Secondary | ICD-10-CM | POA: Insufficient documentation

## 2020-03-21 DIAGNOSIS — I10 Essential (primary) hypertension: Secondary | ICD-10-CM | POA: Insufficient documentation

## 2020-03-21 DIAGNOSIS — E039 Hypothyroidism, unspecified: Secondary | ICD-10-CM | POA: Insufficient documentation

## 2020-03-21 DIAGNOSIS — Z7982 Long term (current) use of aspirin: Secondary | ICD-10-CM | POA: Insufficient documentation

## 2020-03-21 DIAGNOSIS — E1129 Type 2 diabetes mellitus with other diabetic kidney complication: Secondary | ICD-10-CM | POA: Diagnosis not present

## 2020-03-21 DIAGNOSIS — M549 Dorsalgia, unspecified: Secondary | ICD-10-CM | POA: Diagnosis not present

## 2020-03-21 DIAGNOSIS — M2550 Pain in unspecified joint: Secondary | ICD-10-CM | POA: Diagnosis not present

## 2020-03-21 DIAGNOSIS — R52 Pain, unspecified: Secondary | ICD-10-CM | POA: Diagnosis not present

## 2020-03-21 DIAGNOSIS — R5381 Other malaise: Secondary | ICD-10-CM | POA: Diagnosis not present

## 2020-03-21 DIAGNOSIS — Z7401 Bed confinement status: Secondary | ICD-10-CM | POA: Diagnosis not present

## 2020-03-21 NOTE — ED Triage Notes (Signed)
Pt presents via [facility] for evaluation of unwitnessed fall while at [facility]. Pt has hx of dementia and report of pain in back.

## 2020-03-21 NOTE — ED Provider Notes (Signed)
[facility] DEPT Provider Note   CSN: [id number] Arrival date & time: 03/21/20  0534     History Chief Complaint  Patient presents with  . Fall    Stacy Davis is a 74 y.o. female with a history of dementia, diabetes mellitus, hypertension, hyperlipidemia, hypothyroidism, and von Willebrand's disease who presents to the emergency department from long-term care facility via EMS for evaluation of unwitnessed fall.  Per EMS care facility staff did the rounds at 3 AM and patient was doing fine, when they rounded at 5 AM they found her on the ground in the bathroom, does not appear that she had had use the commode.  The patient was awake and alert and at her mental status baseline when staff found her on the ground, she continues to be at her mental status baseline.  The patient denies pain.  She is unable to answer questions consistently with her dementia, level 5 caveat applies.  Patient uses a wheelchair at baseline.  She takes aspirin, she is not on anticoagulation.  HPI     Past Medical History:  Diagnosis Date  . Anxiety   . Arthritis    ?OA vs Rheum (established with Dr. Jefm Bryant pending blood work)  . Articular cartilage disorder of shoulder 04/2012   right  . Back pain    h/o DDD since 2010  . Basal cell carcinoma    right dorsum nose, right forhead  . Dementia ([place])   . Dementia ([place])   . Dental crowns present    also dental caps  . Diabetes mellitus    NIDDM  . Fecal incontinence   . GERD (gastroesophageal reflux disease)   . Headache(784.0)    occasional  . History of skin cancer   . Hyperlipidemia    no current med.  . Hypertension   . Hyperthyroidism   . Hypothyroid   . Impingement syndrome of right shoulder 04/2012  . Memory impairment    dementia- worsenign 12/2016 with medication non compliance and delusions.    . Orthostatic dizziness   . Right rotator cuff tear 05/15/2012  . Rotator cuff rupture 04/2012   right  .  Seizures ([place])   . 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 Von Willebrand disease [facility])     Patient Active Problem List   Diagnosis Date Noted  . Fall at nursing home   . Palliative care by specialist   . Goals of care, counseling/discussion   . General weakness   . Trochanteric fracture ([place]) 01/01/2020  . Closed rib fracture 01/01/2020  . Anemia 01/01/2020  . Hip fx ([place]) 01/01/2020  . Orthostatic dizziness   . Dementia ([place])   . Seizures ([place]) 06/16/2018  . Subarachnoid hemorrhage ([place])   . Syncope 04/11/2018  . Incontinence of feces 01/08/2018  . Depression 10/08/2017  . Memory impairment   . Advance care planning 10/06/2014  . Joint pain 04/15/2013  . Advance directive on file 08/12/2012  . Confusion 06/10/2012  . Right rotator cuff tear 05/15/2012  . Shoulder pain 05/03/2012  . Insomnia 08/13/2011  . 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 HTN (hypertension) 07/18/2011  . HLD (hyperlipidemia) 07/18/2011  . Memory loss 07/18/2011  . Hypothyroid 07/18/2011  . SVT (supraventricular tachycardia) ([place]) 07/18/2011  . Von Willebrand disease ([place]) 07/18/2011  . GERD (gastroesophageal reflux disease) 07/18/2011  . Back pain 07/18/2011  . Fatty liver 07/18/2011    Past  Surgical History:  Procedure Laterality Date  . APPENDECTOMY    . BREAST SURGERY     hematoma evacuation due to trauma  . CARDIAC CATHETERIZATION  03/06/2001  . CESAREAN SECTION     x 2  . COLONOSCOPY  01/20/2006 per patient  . FRACTURE SURGERY    . KNEE ARTHROSCOPY Right 12/01/2014   Procedure: ARTHROSCOPY KNEE,partial medial menisectomy and plica excision;  Surgeon: Hessie Knows, MD;  Location: [facility] ORS;  Service: Orthopedics;  Laterality: Right;  . LUMBAR LAMINECTOMY/DECOMPRESSION MICRODISCECTOMY  03/01/2009   right L4-5; arthrodesis L4-5  . ORIF WRIST FRACTURE     left  . SHOULDER ARTHROSCOPY WITH ROTATOR CUFF REPAIR AND SUBACROMIAL  DECOMPRESSION  05/15/2012   Procedure: SHOULDER ARTHROSCOPY WITH ROTATOR CUFF REPAIR AND SUBACROMIAL DECOMPRESSION;  Surgeon: Johnny Bridge, MD;  Location: [facility];  Service: Orthopedics;  Laterality: Right;  RIGHT ARTHROSCOPY SHOULDER DEBRIDEMENT LIMITED, ARTHROSCOPY SHOULDER DECOMPRESSION SUBACROMIAL PARTIAL ACROMIOPLASTY WITH CORACOACROMIAL RELEASE, ARTHROSCOPY SHOULDER WITH ROTATOR CUFF REPAIR  . TUBAL LIGATION       OB History   No obstetric history on file.     Family History  Problem Relation Age of Onset  . Dementia Mother   . Arthritis Mother   . Cancer Father        unknown primary  . Heart disease Father   . Thyroid disease Sister   . Cancer Sister        brain tumor  . Dementia Sister   . COPD Brother   . Cancer Brother        bone cancer  . Colon cancer Paternal Grandfather   . Breast cancer Neg Hx     Social History   Tobacco Use  . Smoking status: Never Smoker  . Smokeless tobacco: Never Used  Vaping Use  . Vaping Use: Never used  Substance Use Topics  . Alcohol use: No    Alcohol/week: 0.0 standard drinks  . Drug use: No    Home Medications Prior to Admission medications   Medication Sig Start Date End Date Taking? Authorizing Provider  acetaminophen (TYLENOL) 325 MG tablet Take 650 mg by mouth every 6 (six) hours as needed for mild pain.    [provider]  aspirin EC 325 MG tablet Take 1 tablet (325 mg total) by mouth 2 (two) times daily. Patient not taking: Reported on 02/29/2020 01/04/20 01/03/21  Deatra James, MD  aspirin EC 81 MG tablet Take 81 mg by mouth daily. Swallow whole.    [provider]  busPIRone (BUSPAR) 5 MG tablet Take 5 mg by mouth at bedtime.    [provider]  ketorolac (TORADOL) 10 MG tablet Take 1 tablet (10 mg total) by mouth every 6 (six) hours as needed for moderate pain or severe pain. Patient not taking: Reported on 02/29/2020 01/03/20   Deatra James, MD    levETIRAcetam (KEPPRA XR) 500 MG 24 hr tablet Take 2 tablets (1,000 mg total) by mouth at bedtime AND 1 tablet (500 mg total) in the morning. Patient not taking: Reported on 02/29/2020 11/01/19   Dorie Rank, MD  levETIRAcetam (KEPPRA XR) 500 MG 24 hr tablet Take 500 mg by mouth daily.    [provider]  levothyroxine (SYNTHROID) 88 MCG tablet TAKE 1 TABLET BY MOUTH ONCE A DAY Patient taking differently: Take 88 mcg by mouth daily.  10/05/18   Susy Frizzle, MD  LORazepam (ATIVAN) 0.5 MG tablet Take 0.5 mg by mouth 2 (  two) times daily as needed for anxiety.    [provider]  melatonin 3 MG TABS tablet Take 3 mg by mouth at bedtime.    [provider]  omeprazole (PRILOSEC) 20 MG capsule Take 1 capsule (20 mg total) by mouth 2 (two) times daily. Patient not taking: Reported on 02/29/2020 01/04/20 01/03/21  Deatra James, MD  omeprazole (PRILOSEC) 40 MG capsule Take 40 mg by mouth daily.    [provider]  QUEtiapine (SEROQUEL) 25 MG tablet Take 1 tablet (25 mg total) by mouth at bedtime. Patient taking differently: Take 12.5 mg by mouth 2 (two) times daily as needed. Acute Agitation and Aggression. 05/05/19   Marcial Pacas, MD  sertraline (ZOLOFT) 100 MG tablet Take 100 mg by mouth daily.    [provider]  sertraline (ZOLOFT) 50 MG tablet Take 1 tablet (50 mg total) by mouth daily. Patient not taking: Reported on 02/29/2020 05/05/19   Marcial Pacas, MD  Sunscreens (COPPERTONE SPORT SPF30) LOTN Apply 1 application topically every 2 (two) hours as needed (While in the sun).    [provider]  traMADol (ULTRAM) 50 MG tablet Take 50 mg by mouth every 6 (six) hours as needed for moderate pain.    [provider]    Allergies    Tramadol  Review of Systems   Review of Systems  Unable to perform ROS: Dementia    Physical Exam Updated Vital Signs There were no vitals taken for this visit.  Physical Exam Vitals and nursing note  reviewed.  Constitutional:      General: She is not in acute distress.    Appearance: She is well-developed.  HENT:     Head: Normocephalic and atraumatic. No raccoon eyes or Battle's sign.     Right Ear: No hemotympanum.     Left Ear: No hemotympanum.  Eyes:     General:        Right eye: No discharge.        Left eye: No discharge.     Extraocular Movements: Extraocular movements intact.     Conjunctiva/sclera: Conjunctivae normal.     Pupils: Pupils are equal, round, and reactive to light.  Neck:     Comments: C collar in place removed, no midline spinal tenderness, intact normal range of motion. Cardiovascular:     Rate and Rhythm: Normal rate and regular rhythm.     Pulses:          Radial pulses are 2+ on the right side and 2+ on the left side.       Dorsalis pedis pulses are 2+ on the right side and 2+ on the left side.     Heart sounds: No murmur heard.   Pulmonary:     Effort: No respiratory distress.     Breath sounds: Normal breath sounds. No wheezing, rhonchi or rales.  Chest:     Chest wall: No tenderness.  Abdominal:     General: There is no distension.     Palpations: Abdomen is soft.     Tenderness: There is no abdominal tenderness. There is no guarding or rebound.  Musculoskeletal:     Cervical back: No spinous process tenderness.     Comments: Moving all extremities.  Able to range all joints.  No focal bony tenderness throughout the extremities.  No pelvic tenderness or instability.  No midline spinal tenderness or palpable step-off.  Skin:    General: Skin is warm and dry.  Findings: No rash.  Neurological:     Comments: Alert.  CN III to XII grossly intact.  Sensation grossly intact bilateral upper and lower extremities.  5 out of 5 symmetric grip strength.  Patient able to lift bilateral lower extremities off of the bed and hold them there.   Psychiatric:        Behavior: Behavior normal.     ED Results / Procedures / Treatments   Labs (all  labs ordered are listed, but only abnormal results are displayed) Labs Reviewed - No data to display  EKG None  Radiology No results found.  Procedures Procedures (including critical care time)  Medications Ordered in ED Medications - No data to display  ED Course  I have reviewed the triage vital signs and the nursing notes.  Pertinent labs & imaging results that were available during my care of the patient were reviewed by me and considered in my medical decision making (see chart for details).    MDM Rules/Calculators/A&P                          Patient presents to the emergency department via EMS for evaluation status post unwitnessed fall at her care facility.  She denies any areas of pain at this time.  She has no signs of injury on exam and is at her mental status baseline per facility. She takes aspirin but no other anticoagulation.  She overall appears appropriate for discharge home at this time with lack of evidence of injury and her reassuring physical exam. Findings & plan of care discussed with supervising physician Dr. Tomi Bamberger who has evaluated the patient as a shared visit & is in agreement.   Final Clinical Impression(s) / ED Diagnoses Final diagnoses:  Fall, initial encounter    Rx / DC Orders ED Discharge Orders    None       Amaryllis Dyke, PA-C 03/21/20 [id number]    Rolland Porter, MD 03/21/20 [phone]

## 2020-03-21 NOTE — ED Notes (Signed)
[facility] called for transport.  

## 2020-03-21 NOTE — Discharge Instructions (Addendum)
You were seen in the emergency department after a fall, you had a reassuring physical exam, please follow-up with your primary care provider in 3 days.  Return to the ER for new or worsening symptoms or any other concerns.

## 2020-03-22 DIAGNOSIS — M6281 Muscle weakness (generalized): Secondary | ICD-10-CM | POA: Diagnosis not present

## 2020-03-22 DIAGNOSIS — W19XXXA Unspecified fall, initial encounter: Secondary | ICD-10-CM | POA: Diagnosis not present

## 2020-03-22 DIAGNOSIS — G309 Alzheimer's disease, unspecified: Secondary | ICD-10-CM | POA: Diagnosis not present

## 2020-03-22 DIAGNOSIS — R296 Repeated falls: Secondary | ICD-10-CM | POA: Diagnosis not present

## 2020-04-09 DIAGNOSIS — M6281 Muscle weakness (generalized): Secondary | ICD-10-CM | POA: Diagnosis not present

## 2020-04-14 DIAGNOSIS — R454 Irritability and anger: Secondary | ICD-10-CM | POA: Diagnosis not present

## 2020-04-14 DIAGNOSIS — F5102 Adjustment insomnia: Secondary | ICD-10-CM | POA: Diagnosis not present

## 2020-04-14 DIAGNOSIS — R451 Restlessness and agitation: Secondary | ICD-10-CM | POA: Diagnosis not present

## 2020-04-14 DIAGNOSIS — F0391 Unspecified dementia with behavioral disturbance: Secondary | ICD-10-CM | POA: Diagnosis not present

## 2020-04-14 DIAGNOSIS — F419 Anxiety disorder, unspecified: Secondary | ICD-10-CM | POA: Diagnosis not present

## 2020-04-19 ENCOUNTER — Inpatient Hospital Stay (HOSPITAL_COMMUNITY)
Admission: EM | Admit: 2020-04-19 | Discharge: 2020-04-22 | DRG: 101 | Disposition: A | Discharge status: Skilled Nursing Facility | Source: Skilled Nursing Facility | Attending: Student | Admitting: Student

## 2020-04-19 ENCOUNTER — Other Ambulatory Visit: Payer: Self-pay

## 2020-04-19 ENCOUNTER — Encounter (HOSPITAL_COMMUNITY): Payer: Self-pay

## 2020-04-19 ENCOUNTER — Emergency Department (HOSPITAL_COMMUNITY)

## 2020-04-19 DIAGNOSIS — Z79899 Other long term (current) drug therapy: Secondary | ICD-10-CM

## 2020-04-19 DIAGNOSIS — E119 Type 2 diabetes mellitus without complications: Secondary | ICD-10-CM | POA: Diagnosis not present

## 2020-04-19 DIAGNOSIS — E039 Hypothyroidism, unspecified: Secondary | ICD-10-CM | POA: Diagnosis present

## 2020-04-19 DIAGNOSIS — F419 Anxiety disorder, unspecified: Secondary | ICD-10-CM | POA: Diagnosis not present

## 2020-04-19 DIAGNOSIS — W07XXXA Fall from chair, initial encounter: Secondary | ICD-10-CM | POA: Diagnosis not present

## 2020-04-19 DIAGNOSIS — Z79890 Hormone replacement therapy (postmenopausal): Secondary | ICD-10-CM | POA: Diagnosis not present

## 2020-04-19 DIAGNOSIS — Z20822 Contact with and (suspected) exposure to covid-19: Secondary | ICD-10-CM | POA: Diagnosis present

## 2020-04-19 DIAGNOSIS — E1121 Type 2 diabetes mellitus with diabetic nephropathy: Secondary | ICD-10-CM | POA: Diagnosis not present

## 2020-04-19 DIAGNOSIS — R58 Hemorrhage, not elsewhere classified: Secondary | ICD-10-CM | POA: Diagnosis not present

## 2020-04-19 DIAGNOSIS — Z8261 Family history of arthritis: Secondary | ICD-10-CM

## 2020-04-19 DIAGNOSIS — M1990 Unspecified osteoarthritis, unspecified site: Secondary | ICD-10-CM | POA: Diagnosis not present

## 2020-04-19 DIAGNOSIS — N390 Urinary tract infection, site not specified: Secondary | ICD-10-CM | POA: Diagnosis not present

## 2020-04-19 DIAGNOSIS — D680 Von Willebrand's disease: Secondary | ICD-10-CM | POA: Diagnosis present

## 2020-04-19 DIAGNOSIS — Z86008 Personal history of in-situ neoplasm of other site: Secondary | ICD-10-CM

## 2020-04-19 DIAGNOSIS — R404 Transient alteration of awareness: Secondary | ICD-10-CM | POA: Diagnosis not present

## 2020-04-19 DIAGNOSIS — Z85828 Personal history of other malignant neoplasm of skin: Secondary | ICD-10-CM

## 2020-04-19 DIAGNOSIS — G9349 Other encephalopathy: Secondary | ICD-10-CM | POA: Diagnosis present

## 2020-04-19 DIAGNOSIS — Z885 Allergy status to narcotic agent status: Secondary | ICD-10-CM | POA: Diagnosis not present

## 2020-04-19 DIAGNOSIS — Z7982 Long term (current) use of aspirin: Secondary | ICD-10-CM

## 2020-04-19 DIAGNOSIS — Z8249 Family history of ischemic heart disease and other diseases of the circulatory system: Secondary | ICD-10-CM | POA: Diagnosis not present

## 2020-04-19 DIAGNOSIS — R4020 Unspecified coma: Secondary | ICD-10-CM

## 2020-04-19 DIAGNOSIS — F0390 Unspecified dementia without behavioral disturbance: Secondary | ICD-10-CM | POA: Diagnosis not present

## 2020-04-19 DIAGNOSIS — K219 Gastro-esophageal reflux disease without esophagitis: Secondary | ICD-10-CM | POA: Diagnosis present

## 2020-04-19 DIAGNOSIS — Y9269 Other specified industrial and construction area as the place of occurrence of the external cause: Secondary | ICD-10-CM | POA: Diagnosis not present

## 2020-04-19 DIAGNOSIS — S0083XA Contusion of other part of head, initial encounter: Secondary | ICD-10-CM | POA: Diagnosis not present

## 2020-04-19 DIAGNOSIS — S0990XA Unspecified injury of head, initial encounter: Secondary | ICD-10-CM | POA: Diagnosis not present

## 2020-04-19 DIAGNOSIS — G40919 Epilepsy, unspecified, intractable, without status epilepticus: Secondary | ICD-10-CM | POA: Diagnosis not present

## 2020-04-19 DIAGNOSIS — G9389 Other specified disorders of brain: Secondary | ICD-10-CM | POA: Diagnosis not present

## 2020-04-19 DIAGNOSIS — R569 Unspecified convulsions: Secondary | ICD-10-CM | POA: Diagnosis not present

## 2020-04-19 DIAGNOSIS — Z8349 Family history of other endocrine, nutritional and metabolic diseases: Secondary | ICD-10-CM

## 2020-04-19 DIAGNOSIS — S0101XA Laceration without foreign body of scalp, initial encounter: Secondary | ICD-10-CM | POA: Diagnosis not present

## 2020-04-19 DIAGNOSIS — B9620 Unspecified Escherichia coli [E. coli] as the cause of diseases classified elsewhere: Secondary | ICD-10-CM | POA: Diagnosis present

## 2020-04-19 DIAGNOSIS — R0902 Hypoxemia: Secondary | ICD-10-CM | POA: Diagnosis not present

## 2020-04-19 DIAGNOSIS — Z66 Do not resuscitate: Secondary | ICD-10-CM | POA: Diagnosis not present

## 2020-04-19 DIAGNOSIS — E785 Hyperlipidemia, unspecified: Secondary | ICD-10-CM | POA: Diagnosis present

## 2020-04-19 DIAGNOSIS — R413 Other amnesia: Secondary | ICD-10-CM | POA: Diagnosis present

## 2020-04-19 DIAGNOSIS — I10 Essential (primary) hypertension: Secondary | ICD-10-CM | POA: Diagnosis present

## 2020-04-19 DIAGNOSIS — S0003XA Contusion of scalp, initial encounter: Secondary | ICD-10-CM | POA: Diagnosis not present

## 2020-04-19 DIAGNOSIS — G40909 Epilepsy, unspecified, not intractable, without status epilepticus: Principal | ICD-10-CM | POA: Diagnosis present

## 2020-04-19 DIAGNOSIS — W19XXXA Unspecified fall, initial encounter: Secondary | ICD-10-CM | POA: Diagnosis not present

## 2020-04-19 DIAGNOSIS — Y9389 Activity, other specified: Secondary | ICD-10-CM | POA: Diagnosis not present

## 2020-04-19 DIAGNOSIS — Z1624 Resistance to multiple antibiotics: Secondary | ICD-10-CM | POA: Diagnosis not present

## 2020-04-19 DIAGNOSIS — I6523 Occlusion and stenosis of bilateral carotid arteries: Secondary | ICD-10-CM | POA: Diagnosis not present

## 2020-04-19 DIAGNOSIS — R55 Syncope and collapse: Secondary | ICD-10-CM | POA: Diagnosis not present

## 2020-04-19 LAB — BASIC METABOLIC PANEL
Anion gap: 15 (ref 5–15)
BUN: 21 mg/dL (ref 8–23)
CO2: 22 mmol/L (ref 22–32)
Calcium: 9.4 mg/dL (ref 8.9–10.3)
Chloride: 101 mmol/L (ref 98–111)
Creatinine, Ser: 0.77 mg/dL (ref 0.44–1.00)
GFR, Estimated: >60 mL/min (ref >60)
Glucose, Bld: 143 mg/dL — ABNORMAL HIGH (ref 70–99)
Potassium: 4 mmol/L (ref 3.5–5.1)
Sodium: 138 mmol/L (ref 135–145)

## 2020-04-19 LAB — CBC
HCT: 39.8 % (ref 36.0–46.0)
Hemoglobin: 12.5 g/dL (ref 12.0–15.0)
MCH: 27.8 pg (ref 26.0–34.0)
MCHC: 31.4 g/dL (ref 30.0–36.0)
MCV: 88.4 fL (ref 80.0–100.0)
Platelets: 209 10*3/uL (ref 150–400)
RBC: 4.5 MIL/uL (ref 3.87–5.11)
RDW: 14.7 % (ref 11.5–15.5)
WBC: 13.1 10*3/uL — ABNORMAL HIGH (ref 4.0–10.5)
nRBC: 0 % (ref 0.0–0.2)

## 2020-04-19 LAB — CBG MONITORING, ED
Glucose-Capillary: 108 mg/dL — ABNORMAL HIGH (ref 70–99)
Glucose-Capillary: 133 mg/dL — ABNORMAL HIGH (ref 70–99)
Glucose-Capillary: 97 mg/dL (ref 70–99)

## 2020-04-19 MED ORDER — LEVETIRACETAM IN NACL 1000 MG/100ML IV SOLN
1000.0000 mg | Freq: Every day | INTRAVENOUS | Status: DC
  Administered 2020-04-20: 1000 mg via INTRAVENOUS
  Filled 2020-04-19: qty 100

## 2020-04-19 MED ORDER — LEVETIRACETAM IN NACL 1000 MG/100ML IV SOLN
1000.0000 mg | Freq: Once | INTRAVENOUS | Status: AC
  Administered 2020-04-19: 1000 mg via INTRAVENOUS
  Filled 2020-04-19: qty 100

## 2020-04-19 MED ORDER — LEVOTHYROXINE SODIUM 100 MCG/5ML IV SOLN
44.0000 ug | Freq: Every day | INTRAVENOUS | Status: DC
  Administered 2020-04-20: 44 ug via INTRAVENOUS
  Filled 2020-04-19 (×2): qty 5

## 2020-04-19 MED ORDER — ACETAMINOPHEN 650 MG RE SUPP
650.0000 mg | Freq: Four times a day (QID) | RECTAL | Status: DC | PRN
  Administered 2020-04-19: 650 mg via RECTAL
  Filled 2020-04-19: qty 1

## 2020-04-19 MED ORDER — LEVETIRACETAM IN NACL 500 MG/100ML IV SOLN: 500.0000 mg | Freq: Every day | INTRAVENOUS | Status: DC

## 2020-04-19 MED ORDER — ACETAMINOPHEN 325 MG PO TABS: 650.0000 mg | ORAL_TABLET | Freq: Four times a day (QID) | ORAL | Status: DC | PRN

## 2020-04-19 MED ORDER — ENOXAPARIN SODIUM 40 MG/0.4ML ~~LOC~~ SOLN
40.0000 mg | Freq: Every day | SUBCUTANEOUS | Status: DC
  Administered 2020-04-19 – 2020-04-21 (×3): 40 mg via SUBCUTANEOUS
  Filled 2020-04-19 (×3): qty 0.4

## 2020-04-19 NOTE — H&P (Signed)
History and Physical    Stacy Davis JQB:[id number] DOB: 05/30/1945 DOA: 04/19/2020  PCP: Susy Frizzle, MD  Patient coming from: Skilled nursing facility.  Dementia unit.  Chief Complaint: Seizure.  History obtained from ER physician.  No family at the bedside.  Patient is postictal.  HPI: Stacy Davis is a 74 y.o. female with history of dementia, seizures, hypothyroidism was witnessed to have a seizure lasting for around 5 minutes following which patient was encephalopathic.  Patient was brought to the ER.  ED Course: In the ER patient was not hypoxic but was lethargic.  On-call neurology was consulted and since patient still was postictal advised admission for further observation.  UA was concerning for UTI.  Patient was loaded with Keppra 1 g and morning dose of Keppra was increased from 500-7 50.  On exam patient is waking up on calling her name.  Labs show WBC count of 13.1 Covid test negative.  CT head was unremarkable.  Chest x-ray shows nothing acute.  Review of Systems: As per HPI, rest all negative.   Past Medical History:  Diagnosis Date  . Anxiety   . Arthritis    ?OA vs Rheum (established with Dr. Jefm Bryant pending blood work)  . Articular cartilage disorder of shoulder 04/2012   right  . Back pain    h/o DDD since 2010  . Basal cell carcinoma    right dorsum nose, right forhead  . Dementia ([place])   . Dementia ([place])   . Dental crowns present    also dental caps  . Diabetes mellitus    NIDDM  . Fecal incontinence   . GERD (gastroesophageal reflux disease)   . Headache(784.0)    occasional  . History of skin cancer   . Hyperlipidemia    no current med.  . Hypertension   . Hyperthyroidism   . Hypothyroid   . Impingement syndrome of right shoulder 04/2012  . Memory impairment    dementia- worsenign 12/2016 with medication non compliance and delusions.    . Orthostatic dizziness   . Right rotator cuff tear 05/15/2012  . Rotator cuff rupture  04/2012   right  . Seizures ([place])   . 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 Von Willebrand disease ([place])     Past Surgical History:  Procedure Laterality Date  . APPENDECTOMY    . BREAST SURGERY     hematoma evacuation due to trauma  . CARDIAC CATHETERIZATION  03/06/2001  . CESAREAN SECTION     x 2  . COLONOSCOPY  01/20/2006 per patient  . FRACTURE SURGERY    . KNEE ARTHROSCOPY Right 12/01/2014   Procedure: ARTHROSCOPY KNEE,partial medial menisectomy and plica excision;  Surgeon: Hessie Knows, MD;  Location: [facility] ORS;  Service: Orthopedics;  Laterality: Right;  . LUMBAR LAMINECTOMY/DECOMPRESSION MICRODISCECTOMY  03/01/2009   right L4-5; arthrodesis L4-5  . ORIF WRIST FRACTURE     left  . SHOULDER ARTHROSCOPY WITH ROTATOR CUFF REPAIR AND SUBACROMIAL DECOMPRESSION  05/15/2012   Procedure: SHOULDER ARTHROSCOPY WITH ROTATOR CUFF REPAIR AND SUBACROMIAL DECOMPRESSION;  Surgeon: Johnny Bridge, MD;  Location: [facility];  Service: Orthopedics;  Laterality: Right;  RIGHT ARTHROSCOPY SHOULDER DEBRIDEMENT LIMITED, ARTHROSCOPY SHOULDER DECOMPRESSION SUBACROMIAL PARTIAL ACROMIOPLASTY WITH CORACOACROMIAL RELEASE, ARTHROSCOPY SHOULDER WITH ROTATOR CUFF REPAIR  . TUBAL LIGATION       reports that she has never smoked. She has never used smokeless tobacco. She reports that she does not  drink alcohol and does not use drugs.  Allergies  Allergen Reactions  . Tramadol Other (See Comments)    CHANGES IN MEMORY    Family History  Problem Relation Age of Onset  . Dementia Mother   . Arthritis Mother   . Cancer Father        unknown primary  . Heart disease Father   . Thyroid disease Sister   . Cancer Sister        brain tumor  . Dementia Sister   . COPD Brother   . Cancer Brother        bone cancer  . Colon cancer Paternal Grandfather   . Breast cancer Neg Hx     Prior to Admission medications   Medication Sig Start Date End Date Taking?  Authorizing Provider  acetaminophen (TYLENOL) 325 MG tablet Take 650 mg by mouth every 6 (six) hours as needed for mild pain.   Yes [provider]  aspirin (ASPIRIN 81) 81 MG chewable tablet Chew 81 mg by mouth daily.   Yes [provider]  busPIRone (BUSPAR) 5 MG tablet Take 5 mg by mouth 2 (two) times daily.    Yes [provider]  feeding supplement ([place]) LIQD Take 237 mLs by mouth in the morning, at noon, and at bedtime.   Yes [provider]  levETIRAcetam (KEPPRA XR) 500 MG 24 hr tablet Take 500 mg by mouth See admin instructions. Take 2 tablets (1,000 mg total) by mouth at bedtime AND 1 tablet (500 mg total) in the morning.   Yes [provider]  levothyroxine (SYNTHROID) 88 MCG tablet TAKE 1 TABLET BY MOUTH ONCE A DAY Patient taking differently: Take 88 mcg by mouth daily.  10/05/18  Yes Susy Frizzle, MD  LORazepam (ATIVAN) 0.5 MG tablet Take 0.5 mg by mouth 2 (two) times daily as needed for anxiety.   Yes [provider]  melatonin 3 MG TABS tablet Take 3 mg by mouth at bedtime.   Yes [provider]  omeprazole (PRILOSEC) 40 MG capsule Take 40 mg by mouth daily.   Yes [provider]  QUEtiapine (SEROQUEL) 25 MG tablet Take 1 tablet (25 mg total) by mouth at bedtime. Patient taking differently: Take 25 mg by mouth every evening.  05/05/19  Yes Marcial Pacas, MD  sertraline (ZOLOFT) 100 MG tablet Take 100 mg by mouth daily.   Yes [provider]  Sunscreens (COPPERTONE SPORT SPF30) LOTN Apply 1 application topically every 2 (two) hours as needed (While in the sun).   Yes [provider]  traMADol (ULTRAM) 50 MG tablet Take 50 mg by mouth every 6 (six) hours as needed for moderate pain.   Yes [provider]  levETIRAcetam (KEPPRA XR) 500 MG 24 hr tablet Take 2 tablets (1,000 mg total) by mouth at bedtime AND 1 tablet (500 mg total) in the morning. Patient not taking: Reported  on 04/19/2020 11/01/19   Dorie Rank, MD  omeprazole (PRILOSEC) 20 MG capsule Take 1 capsule (20 mg total) by mouth 2 (two) times daily. Patient not taking: Reported on 02/29/2020 01/04/20 01/03/21  Deatra James, MD  sertraline (ZOLOFT) 50 MG tablet Take 1 tablet (50 mg total) by mouth daily. Patient not taking: Reported on 02/29/2020 05/05/19   Marcial Pacas, MD    Physical Exam: Constitutional: Moderately built and nourished. Vitals:   04/19/20 1945 04/19/20 2000 04/19/20 2100 04/19/20 2145  BP: 108/66 122/74 110/61 (!) 98/54  Pulse:  83 74 85 78  Resp: 14 12 14 14   Temp:      TempSrc:      SpO2: 97% 96% 95% 95%  Weight:      Height:       Eyes: Anicteric no pallor. ENMT: No discharge from the ears eyes nose or mouth. Neck: No mass felt.  No neck rigidity. Respiratory: No rhonchi or crepitations. Cardiovascular: S1-S2 heard. Abdomen: Soft nontender bowel sounds present. Musculoskeletal: No edema. Skin: No rash. Neurologic: Patient is lethargic arousable on calling her name.  Moving all extremities. Psychiatric: Is lethargic.   Labs on Admission: I have personally reviewed following labs and imaging studies  CBC: Recent Labs  Lab 04/19/20 1757  WBC 13.1*  HGB 12.5  HCT 39.8  MCV 88.4  PLT 193   Basic Metabolic Panel: Recent Labs  Lab 04/19/20 1757  NA 138  K 4.0  CL 101  CO2 22  GLUCOSE 143*  BUN 21  CREATININE 0.77  CALCIUM 9.4   GFR: Estimated Creatinine Clearance: 52.2 mL/min (by C-G formula based on SCr of 0.77 mg/dL). Liver Function Tests: No results for input(s): AST, ALT, ALKPHOS, BILITOT, PROT, ALBUMIN in the last 168 hours. No results for input(s): LIPASE, AMYLASE in the last 168 hours. No results for input(s): AMMONIA in the last 168 hours. Coagulation Profile: No results for input(s): INR, PROTIME in the last 168 hours. Cardiac Enzymes: No results for input(s): CKTOTAL, CKMB, CKMBINDEX, TROPONINI in the last 168 hours. BNP (last 3 results) No  results for input(s): PROBNP in the last 8760 hours. HbA1C: No results for input(s): HGBA1C in the last 72 hours. CBG: Recent Labs  Lab 04/19/20 1816  GLUCAP 133*   Lipid Profile: No results for input(s): CHOL, HDL, LDLCALC, TRIG, CHOLHDL, LDLDIRECT in the last 72 hours. Thyroid Function Tests: No results for input(s): TSH, T4TOTAL, FREET4, T3FREE, THYROIDAB in the last 72 hours. Anemia Panel: No results for input(s): VITAMINB12, FOLATE, FERRITIN, TIBC, IRON, RETICCTPCT in the last 72 hours. Urine analysis:    Component Value Date/Time   COLORURINE STRAW (A) 01/01/2020 0202   APPEARANCEUR CLEAR 01/01/2020 0202   LABSPEC >1.046 (H) 01/01/2020 0202   PHURINE 7.0 01/01/2020 0202   GLUCOSEU NEGATIVE 01/01/2020 0202   HGBUR SMALL (A) 01/01/2020 0202   BILIRUBINUR NEGATIVE 01/01/2020 0202   BILIRUBINUR negative 06/10/2012 1631   KETONESUR NEGATIVE 01/01/2020 0202   PROTEINUR NEGATIVE 01/01/2020 0202   UROBILINOGEN negative 06/10/2012 1631   NITRITE NEGATIVE 01/01/2020 0202   LEUKOCYTESUR NEGATIVE 01/01/2020 0202   Sepsis Labs: @LABRCNTIP (procalcitonin:4,lacticidven:4) )No results found for this or any previous visit (from the past 240 hour(s)).   Radiological Exams on Admission: CT Head Wo Contrast  Result Date: 04/19/2020 CLINICAL DATA:  Seizure lasting about 5 minutes. EXAM: CT HEAD WITHOUT CONTRAST TECHNIQUE: Contiguous axial images were obtained from the base of the skull through the vertex without intravenous contrast. COMPARISON:  02/29/2020 FINDINGS: Brain: Age related atrophy. Minimal chronic small-vessel change of the deep white matter. No sign of acute infarction, mass lesion, hemorrhage, hydrocephalus or extra-axial collection. Vascular: There is atherosclerotic calcification of the major vessels at the base of the brain. Skull: Negative Sinuses/Orbits: Clear/normal Other: None IMPRESSION: No acute or reversible finding. Age related atrophy. Minimal chronic small-vessel  change of the deep white matter. Electronically Signed   By: Nelson Chimes M.D.   On: 04/19/2020 19:10   DG Chest Port 1 View  Result Date: 04/19/2020 CLINICAL DATA:  Loss of consciousness.  Seizure.  EXAM: PORTABLE CHEST 1 VIEW COMPARISON:  02/29/2020 FINDINGS: Heart size is normal. Mediastinal shadows are normal. The lungs are clear. No infiltrate, collapse or effusion. No heart failure. No significant bone finding. IMPRESSION: No active disease. Electronically Signed   By: Nelson Chimes M.D.   On: 04/19/2020 19:08    EKG: Independently reviewed.  Multiple artifacts.  Monitor shows sinus rhythm.  Assessment/Plan Principal Problem:   Seizure ([place]) Active Problems:   HTN (hypertension)   HLD (hyperlipidemia)   Memory loss   Hypothyroid    1. Seizure -appreciate neurology consult and I did discuss with neurologist.  At this time patient seizure could have been precipitated by patient's possible UTI.  Patient is on morning dose of Keppra has been increased from 500 and it is around 750 now.  Urine dose is around thousand.  Both have been dosed with IV for now.  Patient is presently postictal.  Closely monitor.  Until then we will keep patient n.p.o. 2. Postictal state see #1.  Responding to her name. 3. Possible UTI on ceftriaxone follow urine cultures. 4. History of hypothyroidism we will dose IV Synthroid for now until patient can take orally. 5. Patient is presently n.p.o. once patient becomes more alert awake start her other medications agree antidepressants.   DVT prophylaxis: Lovenox. Code Status: Full code. Family Communication: We will need to discuss with patient's son. Disposition Plan: Back to facility when stable. Consults called: Neurologist. Admission status: Observation.   Rise Patience MD [facility] Pager [phone].  If 7PM-7AM, please contact night-coverage [email or web address] Password [facility]  04/19/2020, 10:18 PM

## 2020-04-19 NOTE — ED Triage Notes (Signed)
Pt BIB [facility] from [facility] with a full body seizure lasting approx 5 mins. Pt has hx of seizure and had one earlier today. Pt bit her tongue, no loss bowel or bladder Pt was sitting in a chair when she had her seizure, denies hitting her head    BP 124/75 HR 104 95% RA  98.0 CBG 127

## 2020-04-19 NOTE — ED Provider Notes (Signed)
[facility] EMERGENCY DEPARTMENT Provider Note   CSN: [id number] Arrival date & time: 04/19/20  1744     History Chief Complaint  Patient presents with  . Seizures    Stacy Davis is a 74 y.o. female.  Patient is a 74 year old female with past medical history of dementia, seizure disorder, diabetes, hypertension.  She is sent from her extended care facility for evaluation of seizure.  From what I am told, patient was sitting upright in a chair when she had an episode of seizure that lasted approximately 5 minutes.  This consisted of generalized shaking.  There was no loss of bowel or bladder continence, but I am told that she bit her tongue.  Patient adds no additional history secondary to altered mental status.  I have also told she had a similar episode earlier today.  Patient takes [place].  The history is provided by the patient.       Past Medical History:  Diagnosis Date  . Anxiety   . Arthritis    ?OA vs Rheum (established with Dr. Jefm Bryant pending blood work)  . Articular cartilage disorder of shoulder 04/2012   right  . Back pain    h/o DDD since 2010  . Basal cell carcinoma    right dorsum nose, right forhead  . Dementia ([place])   . Dementia ([place])   . Dental crowns present    also dental caps  . Diabetes mellitus    NIDDM  . Fecal incontinence   . GERD (gastroesophageal reflux disease)   . Headache(784.0)    occasional  . History of skin cancer   . Hyperlipidemia    no current med.  . Hypertension   . Hyperthyroidism   . Hypothyroid   . Impingement syndrome of right shoulder 04/2012  . Memory impairment    dementia- worsenign 12/2016 with medication non compliance and delusions.    . Orthostatic dizziness   . Right rotator cuff tear 05/15/2012  . Rotator cuff rupture 04/2012   right  . Seizures ([place])   . 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 Von Willebrand disease [facility])     Patient Active Problem  List   Diagnosis Date Noted  . Fall at nursing home   . Palliative care by specialist   . Goals of care, counseling/discussion   . General weakness   . Trochanteric fracture ([place]) 01/01/2020  . Closed rib fracture 01/01/2020  . Anemia 01/01/2020  . Hip fx ([place]) 01/01/2020  . Orthostatic dizziness   . Dementia ([place])   . Seizures ([place]) 06/16/2018  . Subarachnoid hemorrhage ([place])   . Syncope 04/11/2018  . Incontinence of feces 01/08/2018  . Depression 10/08/2017  . Memory impairment   . Advance care planning 10/06/2014  . Joint pain 04/15/2013  . Advance directive on file 08/12/2012  . Confusion 06/10/2012  . Right rotator cuff tear 05/15/2012  . Shoulder pain 05/03/2012  . Insomnia 08/13/2011  . 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 HTN (hypertension) 07/18/2011  . HLD (hyperlipidemia) 07/18/2011  . Memory loss 07/18/2011  . Hypothyroid 07/18/2011  . SVT (supraventricular tachycardia) ([place]) 07/18/2011  . Von Willebrand disease ([place]) 07/18/2011  . GERD (gastroesophageal reflux disease) 07/18/2011  . Back pain 07/18/2011  . Fatty liver 07/18/2011    Past Surgical History:  Procedure Laterality Date  . APPENDECTOMY    . BREAST SURGERY     hematoma evacuation due to  trauma  . CARDIAC CATHETERIZATION  03/06/2001  . CESAREAN SECTION     x 2  . COLONOSCOPY  01/20/2006 per patient  . FRACTURE SURGERY    . KNEE ARTHROSCOPY Right 12/01/2014   Procedure: ARTHROSCOPY KNEE,partial medial menisectomy and plica excision;  Surgeon: Hessie Knows, MD;  Location: [facility] ORS;  Service: Orthopedics;  Laterality: Right;  . LUMBAR LAMINECTOMY/DECOMPRESSION MICRODISCECTOMY  03/01/2009   right L4-5; arthrodesis L4-5  . ORIF WRIST FRACTURE     left  . SHOULDER ARTHROSCOPY WITH ROTATOR CUFF REPAIR AND SUBACROMIAL DECOMPRESSION  05/15/2012   Procedure: SHOULDER ARTHROSCOPY WITH ROTATOR CUFF REPAIR AND SUBACROMIAL DECOMPRESSION;  Surgeon: Johnny Bridge,  MD;  Location: [facility];  Service: Orthopedics;  Laterality: Right;  RIGHT ARTHROSCOPY SHOULDER DEBRIDEMENT LIMITED, ARTHROSCOPY SHOULDER DECOMPRESSION SUBACROMIAL PARTIAL ACROMIOPLASTY WITH CORACOACROMIAL RELEASE, ARTHROSCOPY SHOULDER WITH ROTATOR CUFF REPAIR  . TUBAL LIGATION       OB History   No obstetric history on file.     Family History  Problem Relation Age of Onset  . Dementia Mother   . Arthritis Mother   . Cancer Father        unknown primary  . Heart disease Father   . Thyroid disease Sister   . Cancer Sister        brain tumor  . Dementia Sister   . COPD Brother   . Cancer Brother        bone cancer  . Colon cancer Paternal Grandfather   . Breast cancer Neg Hx     Social History   Tobacco Use  . Smoking status: Never Smoker  . Smokeless tobacco: Never Used  Vaping Use  . Vaping Use: Never used  Substance Use Topics  . Alcohol use: No    Alcohol/week: 0.0 standard drinks  . Drug use: No    Home Medications Prior to Admission medications   Medication Sig Start Date End Date Taking? Authorizing Provider  acetaminophen (TYLENOL) 325 MG tablet Take 650 mg by mouth every 6 (six) hours as needed for mild pain.    [provider]  aspirin EC 81 MG tablet Take 81 mg by mouth daily. Swallow whole.    [provider]  busPIRone (BUSPAR) 5 MG tablet Take 5 mg by mouth at bedtime.    [provider]  levETIRAcetam (KEPPRA XR) 500 MG 24 hr tablet Take 2 tablets (1,000 mg total) by mouth at bedtime AND 1 tablet (500 mg total) in the morning. Patient not taking: Reported on 02/29/2020 11/01/19   Dorie Rank, MD  levETIRAcetam (KEPPRA XR) 500 MG 24 hr tablet Take 500 mg by mouth daily.    [provider]  levothyroxine (SYNTHROID) 88 MCG tablet TAKE 1 TABLET BY MOUTH ONCE A DAY Patient taking differently: Take 88 mcg by mouth daily.  10/05/18   Susy Frizzle, MD  LORazepam (ATIVAN) 0.5 MG tablet Take 0.5 mg by mouth 2  (two) times daily as needed for anxiety.    [provider]  melatonin 3 MG TABS tablet Take 3 mg by mouth at bedtime.    [provider]  omeprazole (PRILOSEC) 20 MG capsule Take 1 capsule (20 mg total) by mouth 2 (two) times daily. Patient not taking: Reported on 02/29/2020 01/04/20 01/03/21  Deatra James, MD  omeprazole (PRILOSEC) 40 MG capsule Take 40 mg by mouth daily.    [provider]  QUEtiapine (SEROQUEL) 25 MG tablet Take 1 tablet (25 mg total) by  mouth at bedtime. Patient taking differently: Take 12.5 mg by mouth 2 (two) times daily as needed. Acute Agitation and Aggression. 05/05/19   Marcial Pacas, MD  sertraline (ZOLOFT) 100 MG tablet Take 100 mg by mouth daily.    [provider]  sertraline (ZOLOFT) 50 MG tablet Take 1 tablet (50 mg total) by mouth daily. Patient not taking: Reported on 02/29/2020 05/05/19   Marcial Pacas, MD  Sunscreens (COPPERTONE SPORT SPF30) LOTN Apply 1 application topically every 2 (two) hours as needed (While in the sun).    [provider]  traMADol (ULTRAM) 50 MG tablet Take 50 mg by mouth every 6 (six) hours as needed for moderate pain.    [provider]    Allergies    Tramadol  Review of Systems   Review of Systems  Unable to perform ROS: Mental status change  Neurological: Positive for seizures.    Physical Exam Updated Vital Signs BP 133/65 (BP Location: Right Arm)   Pulse 89   Temp 98.1 F (36.7 C) (Axillary)   Resp 15   Ht 5\' 3"  (1.6 m)   Wt 52 kg   SpO2 99%   BMI 20.31 kg/m   Physical Exam Vitals and nursing note reviewed.  Constitutional:      Appearance: She is well-developed.     Comments: Patient is a chronically ill-appearing, elderly female.  She appears to be resting comfortably in no distress.  HENT:     Head: Normocephalic and atraumatic.  Cardiovascular:     Rate and Rhythm: Normal rate and regular rhythm.     Heart sounds: No murmur heard.  No friction rub. No  gallop.   Pulmonary:     Effort: Pulmonary effort is normal. No respiratory distress.     Breath sounds: Normal breath sounds. No wheezing.  Abdominal:     General: Bowel sounds are normal. There is no distension.     Palpations: Abdomen is soft.     Tenderness: There is no abdominal tenderness.  Musculoskeletal:        General: Normal range of motion.     Cervical back: Normal range of motion and neck supple.  Skin:    General: Skin is warm and dry.  Neurological:     Comments: Patient is resting comfortably.  She will not respond to voice or commands, she does localize to noxious stimuli.  Remainder of neurologic exam limited secondary to patient mental status.     ED Results / Procedures / Treatments   Labs (all labs ordered are listed, but only abnormal results are displayed) Labs Reviewed  BASIC METABOLIC PANEL  CBC  URINALYSIS, ROUTINE W REFLEX MICROSCOPIC  CBG MONITORING, ED    EKG None  Radiology No results found.  Procedures Procedures (including critical care time)  Medications Ordered in ED Medications - No data to display  ED Course  I have reviewed the triage vital signs and the nursing notes.  Pertinent labs & imaging results that were available during my care of the patient were reviewed by me and considered in my medical decision making (see chart for details).    MDM Rules/Calculators/A&P  Patient is a 74 year old female with history of dementia and seizure disorder.  She has been sent from her extended care facility for evaluation of seizures.  Patient has apparently had 2 seizures this afternoon.  She has been exceedingly somnolent since the second seizure.  Patient barely opens her eyes to noxious stimuli and loud voice and  is not currently following commands.  CT scan of the head is unremarkable and laboratory studies are also essentially unremarkable.  Care was discussed with Dr. Leonel Ramsay who was evaluated the patient.  He feels as though  overnight observation is appropriate.  Patient will be given an IV dose of Keppra and will be admitted to the hospitalist service under the care of Dr. Hal Hope.  Final Clinical Impression(s) / ED Diagnoses Final diagnoses:  None    Rx / DC Orders ED Discharge Orders    None       Veryl Speak, MD 04/19/20 2204

## 2020-04-20 DIAGNOSIS — Z8249 Family history of ischemic heart disease and other diseases of the circulatory system: Secondary | ICD-10-CM | POA: Diagnosis not present

## 2020-04-20 DIAGNOSIS — E119 Type 2 diabetes mellitus without complications: Secondary | ICD-10-CM | POA: Diagnosis present

## 2020-04-20 DIAGNOSIS — R569 Unspecified convulsions: Secondary | ICD-10-CM

## 2020-04-20 DIAGNOSIS — Z85828 Personal history of other malignant neoplasm of skin: Secondary | ICD-10-CM | POA: Diagnosis not present

## 2020-04-20 DIAGNOSIS — Z7982 Long term (current) use of aspirin: Secondary | ICD-10-CM | POA: Diagnosis not present

## 2020-04-20 DIAGNOSIS — B9620 Unspecified Escherichia coli [E. coli] as the cause of diseases classified elsewhere: Secondary | ICD-10-CM | POA: Diagnosis not present

## 2020-04-20 DIAGNOSIS — E785 Hyperlipidemia, unspecified: Secondary | ICD-10-CM | POA: Diagnosis present

## 2020-04-20 DIAGNOSIS — Y9389 Activity, other specified: Secondary | ICD-10-CM | POA: Diagnosis not present

## 2020-04-20 DIAGNOSIS — Z20822 Contact with and (suspected) exposure to covid-19: Secondary | ICD-10-CM | POA: Diagnosis present

## 2020-04-20 DIAGNOSIS — W07XXXA Fall from chair, initial encounter: Secondary | ICD-10-CM | POA: Diagnosis not present

## 2020-04-20 DIAGNOSIS — S0083XA Contusion of other part of head, initial encounter: Secondary | ICD-10-CM | POA: Diagnosis not present

## 2020-04-20 DIAGNOSIS — G40909 Epilepsy, unspecified, not intractable, without status epilepticus: Secondary | ICD-10-CM | POA: Diagnosis present

## 2020-04-20 DIAGNOSIS — E1121 Type 2 diabetes mellitus with diabetic nephropathy: Secondary | ICD-10-CM | POA: Diagnosis not present

## 2020-04-20 DIAGNOSIS — S0990XA Unspecified injury of head, initial encounter: Secondary | ICD-10-CM | POA: Diagnosis not present

## 2020-04-20 DIAGNOSIS — M1990 Unspecified osteoarthritis, unspecified site: Secondary | ICD-10-CM | POA: Diagnosis present

## 2020-04-20 DIAGNOSIS — Z8349 Family history of other endocrine, nutritional and metabolic diseases: Secondary | ICD-10-CM | POA: Diagnosis not present

## 2020-04-20 DIAGNOSIS — W19XXXA Unspecified fall, initial encounter: Secondary | ICD-10-CM | POA: Diagnosis not present

## 2020-04-20 DIAGNOSIS — Z8261 Family history of arthritis: Secondary | ICD-10-CM | POA: Diagnosis not present

## 2020-04-20 DIAGNOSIS — Z79899 Other long term (current) drug therapy: Secondary | ICD-10-CM | POA: Diagnosis not present

## 2020-04-20 DIAGNOSIS — Y9269 Other specified industrial and construction area as the place of occurrence of the external cause: Secondary | ICD-10-CM | POA: Diagnosis not present

## 2020-04-20 DIAGNOSIS — Z885 Allergy status to narcotic agent status: Secondary | ICD-10-CM | POA: Diagnosis not present

## 2020-04-20 DIAGNOSIS — N390 Urinary tract infection, site not specified: Secondary | ICD-10-CM | POA: Diagnosis not present

## 2020-04-20 DIAGNOSIS — Z1624 Resistance to multiple antibiotics: Secondary | ICD-10-CM | POA: Diagnosis present

## 2020-04-20 DIAGNOSIS — G40919 Epilepsy, unspecified, intractable, without status epilepticus: Secondary | ICD-10-CM | POA: Diagnosis present

## 2020-04-20 DIAGNOSIS — K219 Gastro-esophageal reflux disease without esophagitis: Secondary | ICD-10-CM | POA: Diagnosis present

## 2020-04-20 DIAGNOSIS — D680 Von Willebrand's disease: Secondary | ICD-10-CM | POA: Diagnosis present

## 2020-04-20 DIAGNOSIS — Z79890 Hormone replacement therapy (postmenopausal): Secondary | ICD-10-CM | POA: Diagnosis not present

## 2020-04-20 DIAGNOSIS — Z66 Do not resuscitate: Secondary | ICD-10-CM | POA: Diagnosis not present

## 2020-04-20 DIAGNOSIS — F0390 Unspecified dementia without behavioral disturbance: Secondary | ICD-10-CM | POA: Diagnosis not present

## 2020-04-20 DIAGNOSIS — R58 Hemorrhage, not elsewhere classified: Secondary | ICD-10-CM | POA: Diagnosis not present

## 2020-04-20 DIAGNOSIS — Z86008 Personal history of in-situ neoplasm of other site: Secondary | ICD-10-CM | POA: Diagnosis not present

## 2020-04-20 DIAGNOSIS — F419 Anxiety disorder, unspecified: Secondary | ICD-10-CM | POA: Diagnosis not present

## 2020-04-20 DIAGNOSIS — I10 Essential (primary) hypertension: Secondary | ICD-10-CM | POA: Diagnosis not present

## 2020-04-20 DIAGNOSIS — G9349 Other encephalopathy: Secondary | ICD-10-CM | POA: Diagnosis present

## 2020-04-20 DIAGNOSIS — E039 Hypothyroidism, unspecified: Secondary | ICD-10-CM | POA: Diagnosis not present

## 2020-04-20 LAB — CBC
HCT: 34.7 % — ABNORMAL LOW (ref 36.0–46.0)
Hemoglobin: 11 g/dL — ABNORMAL LOW (ref 12.0–15.0)
MCH: 28.1 pg (ref 26.0–34.0)
MCHC: 31.7 g/dL (ref 30.0–36.0)
MCV: 88.7 fL (ref 80.0–100.0)
Platelets: 180 10*3/uL (ref 150–400)
RBC: 3.91 MIL/uL (ref 3.87–5.11)
RDW: 15 % (ref 11.5–15.5)
WBC: 10.4 10*3/uL (ref 4.0–10.5)
nRBC: 0 % (ref 0.0–0.2)

## 2020-04-20 LAB — TSH: TSH: 0.583 u[IU]/mL (ref 0.350–4.500)

## 2020-04-20 LAB — RESP PANEL BY RT-PCR (FLU A&B, COVID) ARPGX2
Influenza A by PCR: NEGATIVE
Influenza B by PCR: NEGATIVE
SARS Coronavirus 2 by RT PCR: NEGATIVE

## 2020-04-20 LAB — URINALYSIS, ROUTINE W REFLEX MICROSCOPIC
Bilirubin Urine: NEGATIVE
Glucose, UA: NEGATIVE mg/dL
Hgb urine dipstick: NEGATIVE
Ketones, ur: NEGATIVE mg/dL
Nitrite: POSITIVE — AB
Protein, ur: NEGATIVE mg/dL
Specific Gravity, Urine: 1.02 (ref 1.005–1.030)
pH: 6 (ref 5.0–8.0)

## 2020-04-20 LAB — CBG MONITORING, ED
Glucose-Capillary: 81 mg/dL (ref 70–99)
Glucose-Capillary: 80 mg/dL (ref 70–99)

## 2020-04-20 LAB — BASIC METABOLIC PANEL
Anion gap: 11 (ref 5–15)
BUN: 20 mg/dL (ref 8–23)
CO2: 26 mmol/L (ref 22–32)
Calcium: 9.1 mg/dL (ref 8.9–10.3)
Chloride: 103 mmol/L (ref 98–111)
Creatinine, Ser: 0.68 mg/dL (ref 0.44–1.00)
GFR, Estimated: >60 mL/min (ref >60)
Glucose, Bld: 80 mg/dL (ref 70–99)
Potassium: 3.8 mmol/L (ref 3.5–5.1)
Sodium: 140 mmol/L (ref 135–145)

## 2020-04-20 MED ORDER — SODIUM CHLORIDE 0.9 % IV SOLN
750.0000 mg | Freq: Every day | INTRAVENOUS | Status: DC
  Administered 2020-04-20: 750 mg via INTRAVENOUS
  Filled 2020-04-20 (×4): qty 7.5

## 2020-04-20 MED ORDER — SODIUM CHLORIDE 0.9 % IV SOLN
1.0000 g | Freq: Once | INTRAVENOUS | Status: AC
  Administered 2020-04-20: 1 g via INTRAVENOUS
  Filled 2020-04-20: qty 10

## 2020-04-20 MED ORDER — DEXTROSE-NACL 5-0.9 % IV SOLN: INTRAVENOUS | Status: DC

## 2020-04-20 MED ORDER — SODIUM CHLORIDE 0.9 % IV SOLN
1.0000 g | INTRAVENOUS | Status: DC
  Administered 2020-04-21 – 2020-04-22 (×2): 1 g via INTRAVENOUS
  Filled 2020-04-20 (×2): qty 10

## 2020-04-20 NOTE — Progress Notes (Signed)
PROGRESS NOTE  Stacy Davis:[id number] DOB: 29-Oct-1945   PCP: Donita Brooks, MD  Patient is from: Memory care center  DOA: 04/19/2020 LOS: 0  Chief complaints: Seizure  Brief Narrative / Interim history: 74 year old female with history of dementia, seizure disorder, hypothyroidism and anxiety brought to ED from memory care center with a seizure that has lasted about 5 minutes.   In ED, hemodynamically stable.  On room air.  She was somewhat lethargic and postictal.  Neurology consulted I recommended increasing her Keppra from 500 mg to 750 mg twice daily.  She was loaded with Keppra 1 g as well.  WBC 13.1.  BMP, CXR and CT head without significant finding.  UA concerning for UTI.  Urine culture sent.  Started on ceftriaxone.   Subjective: Seen and examined earlier this morning.  No major events overnight of this morning.  She is awake and alert but confused.  Not able to tell me her name or follow commands.  Does not appear to be in distress.  No focal neuro deficits  Objective: Vitals:   04/20/20 0839 04/20/20 0900 04/20/20 0943 04/20/20 1106  BP:  106/62 106/62   Pulse:  68 68   Resp:  14 14   Temp: 98.1 F (36.7 C) (!) 97.4 F (36.3 C) (!) 97.4 F (36.3 C) 98.2 F (36.8 C)  TempSrc: Oral Axillary Oral Oral  SpO2:  100% 100%   Weight:      Height:        Intake/Output Summary (Last 24 hours) at 04/20/2020 1208 Last data filed at 04/20/2020 1100 Gross per 24 hour  Intake 108 ml  Output 100 ml  Net 8 ml   Filed Weights   04/19/20 1755  Weight: 52 kg    Examination:  GENERAL: No apparent distress.  Nontoxic. HEENT: MMM.  Vision and hearing grossly intact.  NECK: Supple.  No apparent JVD.  RESP: On room air.  No IWOB.  Fair aeration bilaterally. CVS:  RRR. Heart sounds normal.  ABD/GI/GU: BS+. Abd soft, NTND.  MSK/EXT:  Moves extremities. No apparent deformity. No edema.  SKIN: no apparent skin lesion or wound NEURO: Awake and alert.   Disoriented.  Does not follows command.  No apparent focal neuro deficit but limited exam PSYCH: Calm.  No distress or agitation.  Procedures:  None  Microbiology summarized: COVID-19 and influenza PCR nonreactive.  Assessment & Plan: Breakthrough seizure: she is awake and alert but disoriented and not able to follow commands.  Seizure might have been provoked by UTI -Increase morning dose of Keppra from 500 mg to 750 mg -Continue nightly Keppra at 1000 mg -Seizure and fall precautions. -Manage UTI as below. -Follow further neurology recommendations.  Acute encephalopathy: Likely postictal.  She is awake and alert but disoriented. -Swallow eval before feeding. -N.p.o. pending swallow eval.  Dementia without behavioral disturbance-she is completely disoriented not even able to follow command.  No agitation or distress. -Reorientation and delirium precautions.  Possible UTI: UA with nitrites. Mild leukocytosis but no fever.  Slight suprapubic tenderness -Continue IV ceftriaxone -Follow urine culture  Hypothyroidism: -Check TSH -Continue IV Synthroid pending swallow eval.  Anxiety: Stable. -Resume home meds after swallow eval.  Goal of care: Patient has DNR/DNI discharge at bedside. -Changed CODE STATUS to DNR/DNI.  Body mass index is 20.31 kg/m.         DVT prophylaxis:  enoxaparin (LOVENOX) injection 40 mg Start: 04/19/20 2230  Code Status: DNR/DNI Family Communication: Patient and/or RN.  Attempted to call patient's son for update but no answer.  Did not leave voicemail on generic voicemail.  Status is: Observation  The patient will require care spanning > 2 midnights and should be moved to inpatient because: Altered mental status, Unsafe d/c plan, IV treatments appropriate due to intensity of illness or inability to take PO and Inpatient level of care appropriate due to severity of illness  Dispo: The patient is from: Memory care center              Anticipated  d/c is to: To be determined              Anticipated d/c date is: 2 days              Patient currently is not medically stable to d/c.       Consultants:  Neurology   Sch Meds:  Scheduled Meds:  enoxaparin (LOVENOX) injection  40 mg Subcutaneous QHS   levothyroxine  44 mcg Intravenous Daily   Continuous Infusions:  [START ON 04/21/2020] cefTRIAXone (ROCEPHIN)  IV     levETIRAcetam 750 mg (04/20/20 1014)   levETIRAcetam     PRN Meds:.acetaminophen **OR** acetaminophen  Antimicrobials: Anti-infectives (From admission, onward)   Start     Dose/Rate Route Frequency Ordered Stop   04/21/20 0600  cefTRIAXone (ROCEPHIN) 1 g in sodium chloride 0.9 % 100 mL IVPB        1 g 200 mL/hr over 30 Minutes Intravenous Every 24 hours 04/20/20 0628     04/20/20 0645  cefTRIAXone (ROCEPHIN) 1 g in sodium chloride 0.9 % 100 mL IVPB        1 g 200 mL/hr over 30 Minutes Intravenous  Once 04/20/20 0630 04/20/20 0729       I have personally reviewed the following labs and images: CBC: Recent Labs  Lab 04/19/20 1757 04/20/20 0427  WBC 13.1* 10.4  HGB 12.5 11.0*  HCT 39.8 34.7*  MCV 88.4 88.7  PLT 209 180   BMP &GFR Recent Labs  Lab 04/19/20 1757 04/20/20 0427  NA 138 140  K 4.0 3.8  CL 101 103  CO2 22 26  GLUCOSE 143* 80  BUN 21 20  CREATININE 0.77 0.68  CALCIUM 9.4 9.1   Estimated Creatinine Clearance: 52.2 mL/min (by C-G formula based on SCr of 0.68 mg/dL). Liver & Pancreas: No results for input(s): AST, ALT, ALKPHOS, BILITOT, PROT, ALBUMIN in the last 168 hours. No results for input(s): LIPASE, AMYLASE in the last 168 hours. No results for input(s): AMMONIA in the last 168 hours. Diabetic: No results for input(s): HGBA1C in the last 72 hours. Recent Labs  Lab 04/19/20 1816 04/19/20 2251 04/19/20 2339 04/20/20 0412 04/20/20 0753  GLUCAP 133* 108* 97 81 80   Cardiac Enzymes: No results for input(s): CKTOTAL, CKMB, CKMBINDEX, TROPONINI in the last 168  hours. No results for input(s): PROBNP in the last 8760 hours. Coagulation Profile: No results for input(s): INR, PROTIME in the last 168 hours. Thyroid Function Tests: No results for input(s): TSH, T4TOTAL, FREET4, T3FREE, THYROIDAB in the last 72 hours. Lipid Profile: No results for input(s): CHOL, HDL, LDLCALC, TRIG, CHOLHDL, LDLDIRECT in the last 72 hours. Anemia Panel: No results for input(s): VITAMINB12, FOLATE, FERRITIN, TIBC, IRON, RETICCTPCT in the last 72 hours. Urine analysis:    Component Value Date/Time   COLORURINE YELLOW 04/20/2020 0008   APPEARANCEUR HAZY (A) 04/20/2020 0008   LABSPEC 1.020 04/20/2020 0008   PHURINE 6.0 04/20/2020 0008  GLUCOSEU NEGATIVE 04/20/2020 0008   HGBUR NEGATIVE 04/20/2020 0008   BILIRUBINUR NEGATIVE 04/20/2020 0008   BILIRUBINUR negative 06/10/2012 1631   KETONESUR NEGATIVE 04/20/2020 0008   PROTEINUR NEGATIVE 04/20/2020 0008   UROBILINOGEN negative 06/10/2012 1631   NITRITE POSITIVE (A) 04/20/2020 0008   LEUKOCYTESUR TRACE (A) 04/20/2020 0008   Sepsis Labs: Invalid input(s): PROCALCITONIN, LACTICIDVEN  Microbiology: Recent Results (from the past 240 hour(s))  Resp Panel by RT-PCR (Flu A&B, Covid) Nasopharyngeal Swab     Status: None   Collection Time: 04/19/20 11:21 PM   Specimen: Nasopharyngeal Swab; Nasopharyngeal(NP) swabs in vial transport medium  Result Value Ref Range Status   SARS Coronavirus 2 by RT PCR NEGATIVE NEGATIVE Final    Comment: (NOTE) SARS-CoV-2 target nucleic acids are NOT DETECTED.  The SARS-CoV-2 RNA is generally detectable in upper respiratory specimens during the acute phase of infection. The lowest concentration of SARS-CoV-2 viral copies this assay can detect is 138 copies/mL. A negative result does not preclude SARS-Cov-2 infection and should not be used as the sole basis for treatment or other patient management decisions. A negative result may occur with  improper specimen collection/handling,  submission of specimen other than nasopharyngeal swab, presence of viral mutation(s) within the areas targeted by this assay, and inadequate number of viral copies(<138 copies/mL). A negative result must be combined with clinical observations, patient history, and epidemiological information. The expected result is Negative.  Fact Sheet for Patients:  BloggerCourse.com  Fact Sheet for Healthcare Providers:  SeriousBroker.it  This test is no t yet approved or cleared by the [place] FDA and  has been authorized for detection and/or diagnosis of SARS-CoV-2 by FDA under an Emergency Use Authorization (EUA). This EUA will remain  in effect (meaning this test can be used) for the duration of the COVID-19 declaration under Section 564(b)(1) of the Act, 21 U.S.C.section 360bbb-3(b)(1), unless the authorization is terminated  or revoked sooner.       Influenza A by PCR NEGATIVE NEGATIVE Final   Influenza B by PCR NEGATIVE NEGATIVE Final    Comment: (NOTE) The Xpert Xpress SARS-CoV-2/FLU/RSV plus assay is intended as an aid in the diagnosis of influenza from Nasopharyngeal swab specimens and should not be used as a sole basis for treatment. Nasal washings and aspirates are unacceptable for Xpert Xpress SARS-CoV-2/FLU/RSV testing.  Fact Sheet for Patients: BloggerCourse.com  Fact Sheet for Healthcare Providers: SeriousBroker.it  This test is not yet approved or cleared by the [place] FDA and has been authorized for detection and/or diagnosis of SARS-CoV-2 by FDA under an Emergency Use Authorization (EUA). This EUA will remain in effect (meaning this test can be used) for the duration of the COVID-19 declaration under Section 564(b)(1) of the Act, 21 U.S.C. section 360bbb-3(b)(1), unless the authorization is terminated or revoked.  Performed at [facility], [address] [place], [place] [address]     Radiology Studies: CT Head Wo Contrast  Result Date: 04/19/2020 CLINICAL DATA:  Seizure lasting about 5 minutes. EXAM: CT HEAD WITHOUT CONTRAST TECHNIQUE: Contiguous axial images were obtained from the base of the skull through the vertex without intravenous contrast. COMPARISON:  02/29/2020 FINDINGS: Brain: Age related atrophy. Minimal chronic small-vessel change of the deep white matter. No sign of acute infarction, mass lesion, hemorrhage, hydrocephalus or extra-axial collection. Vascular: There is atherosclerotic calcification of the major vessels at the base of the brain. Skull: Negative Sinuses/Orbits: Clear/normal Other: None IMPRESSION: No acute or reversible finding. Age related atrophy.  Minimal chronic small-vessel change of the deep white matter. Electronically Signed   By: Paulina Fusi M.D.   On: 04/19/2020 19:10   DG Chest Port 1 View  Result Date: 04/19/2020 CLINICAL DATA:  Loss of consciousness.  Seizure. EXAM: PORTABLE CHEST 1 VIEW COMPARISON:  02/29/2020 FINDINGS: Heart size is normal. Mediastinal shadows are normal. The lungs are clear. No infiltrate, collapse or effusion. No heart failure. No significant bone finding. IMPRESSION: No active disease. Electronically Signed   By: Paulina Fusi M.D.   On: 04/19/2020 19:08      Alexys Gassett T. Kaylan Yates [facility]  If 7PM-7AM, please contact night-coverage [email or web address] 04/20/2020, 12:08 PM

## 2020-04-20 NOTE — Plan of Care (Signed)
  Problem: Coping: Goal: Level of anxiety will decrease Outcome: Progressing   Problem: Activity: Goal: Risk for activity intolerance will decrease Outcome: Progressing   

## 2020-04-20 NOTE — Progress Notes (Signed)
Dr. Cyndia Skeeters notified of DNR form in chart.

## 2020-04-20 NOTE — Consult Note (Signed)
Neurology Consultation Reason for Consult: Breakthrough seizure Referring Physician: Stark Jock, D  CC: Seizure  History is obtained from: Chart review  HPI: Stacy Davis is a 74 y.o. female with a history of relatively advanced dementia as well as seizure disorder who presents for evaluation of seizure.  She lives in a memory care unit, then had a seizure episode lasting approximately 5 minutes.  She takes Keppra 500 mg every morning and 1000 mg every afternoon, and this happened prior to her evening dose.  She commonly has prolonged postictal periods and is currently continuing to be postictal and therefore neurology has been consulted for further recommendations.  ROS: Unable to obtain due to altered mental status.   Past Medical History:  Diagnosis Date  . Anxiety   . Arthritis    ?OA vs Rheum (established with Dr. Jefm Bryant pending blood work)  . Articular cartilage disorder of shoulder 04/2012   right  . Back pain    h/o DDD since 2010  . Basal cell carcinoma    right dorsum nose, right forhead  . Dementia ([place])   . Dementia ([place])   . Dental crowns present    also dental caps  . Diabetes mellitus    NIDDM  . Fecal incontinence   . GERD (gastroesophageal reflux disease)   . Headache(784.0)    occasional  . History of skin cancer   . Hyperlipidemia    no current med.  . Hypertension   . Hyperthyroidism   . Hypothyroid   . Impingement syndrome of right shoulder 04/2012  . Memory impairment    dementia- worsenign 12/2016 with medication non compliance and delusions.    . Orthostatic dizziness   . Right rotator cuff tear 05/15/2012  . Rotator cuff rupture 04/2012   right  . Seizures ([place])   . 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 Von Willebrand disease ([place])      Family History  Problem Relation Age of Onset  . Dementia Mother   . Arthritis Mother   . Cancer Father        unknown primary  . Heart disease Father   . Thyroid disease  Sister   . Cancer Sister        brain tumor  . Dementia Sister   . COPD Brother   . Cancer Brother        bone cancer  . Colon cancer Paternal Grandfather   . Breast cancer Neg Hx      Social History:  reports that she has never smoked. She has never used smokeless tobacco. She reports that she does not drink alcohol and does not use drugs.   Exam: Current vital signs: BP (!) 102/59   Pulse 79   Temp 99.9 F (37.7 C) (Axillary)   Resp 20   Ht 5\' 3"  (1.6 m)   Wt 52 kg   SpO2 96%   BMI 20.31 kg/m  Vital signs in last 24 hours: Temp:  [98.1 F (36.7 C)-100.2 F (37.9 C)] 99.9 F (37.7 C) (11/24 2306) Pulse Rate:  [74-91] 79 (11/25 0030) Resp:  [11-20] 20 (11/25 0030) BP: (92-133)/(54-83) 102/59 (11/25 0030) SpO2:  [93 %-99 %] 96 % (11/25 0030) Weight:  [52 kg] 52 kg (11/24 1755)   Physical Exam  Constitutional: Appears elderly Psych: Affect appropriate to situation Eyes: No scleral injection HENT: No OP obstrucion MSK: no joint deformities.  Cardiovascular: Normal rate and regular rhythm.  Respiratory: Effort normal,  non-labored breathing GI: Soft.  No distension. There is no tenderness.  Skin: WDI  Neuro: Mental Status: Patient is obtunded, but with repeated arousal, she does wiggle toes and stick out tongue to command. Cranial Nerves: II: She does not cooperate with visual field testing and keeps her eyes tightly shut preventing testing blink to threat.  Pupils are equal, round, and reactive to light.   III,IV, VI: She does not look in either direction and resists doll's eye maneuver. V:VII: Blinks to eyelid stimulation bilaterally, keeps her eyes tightly shut. Motor: She moves all extremities to noxious stimulation Sensory: As above  Cerebellar: Does not perform   I have reviewed labs in epic and the results pertinent to this consultation are: GFR greater than 60  I have reviewed the images obtained: CT head-no acute findings  Impression:  74 year old female with breakthrough seizure in the setting of dementia.  Urinalysis with?  UTI.  If she did have a UTI, this could lower seizure threshold.  At the current time I would favor small adjustment to her Keppra.  Recommendations: 1) Keppra 1 g nightly, increase a.m. dose to 750. 2) neurology to follow   Roland Rack, MD [facility] [phone]  If 7pm- 7am, please page neurology on call as listed in [place].

## 2020-04-20 NOTE — ED Notes (Signed)
Patient Cbg 80.

## 2020-04-20 NOTE — ED Notes (Signed)
Micro called to add on urine culture

## 2020-04-20 NOTE — Progress Notes (Signed)
Neurology Progress Note  S: No further seizures. She is awake, alert, answers very simple questions.   O: Current vital signs: BP 106/62 (BP Location: Left Arm)   Pulse 68   Temp (!) 97.4 F (36.3 C) (Oral)   Resp 14   Ht 5\' 3"  (1.6 m)   Wt 52 kg   SpO2 100%   BMI 20.31 kg/m  Vital signs in last 24 hours: Temp:  [97.4 F (36.3 C)-100.2 F (37.9 C)] 97.4 F (36.3 C) (11/25 0943) Pulse Rate:  [58-91] 68 (11/25 0943) Resp:  [5-21] 14 (11/25 0943) BP: (90-133)/(54-83) 106/62 (11/25 0943) SpO2:  [84 %-100 %] 100 % (11/25 0943) Weight:  [52 kg] 52 kg (11/24 1755) GENERAL: Awake, alert in NAD. Frail.  HEENT: Normocephalic and atraumatic, dry mm, no LN++ LUNGS: Normal respiratory effort.  CV: RRR ABDOMEN: Soft, nontender Ext: warm, well perfused NEURO:  Mental Status: Awake, alert, does not answer orientation questions. Eyes open, tracks faces across the room and maintains eye contact. Responds to very simple questions with a yes or no. Poor participation with exam overall. Language: non fluent, comprehension intact to simple commands. Did not name objects, or repeat. Cranial Nerves: PERRL 3 mm/brisk. Will not cooperate with EOMI, or sensory exam. Attempted to perform, she just does't answer. hearing intact. Will not open mouth. Will not cooperate for sternocleidomastoid and trapezius muscle strength. No evidence of tongue atrophy or fasciculations.  Motor: She moves all extremities spontaneously. Uncooperative for strength exam. Tone: is normal but bulk is decreased.  Sensation-Withdraws legs in response to tickling of feet. Coordination: unable to test due to not following commands, but no ataxia noted to extremities.  Gait- deferred  Medications  Current Facility-Administered Medications:  .  acetaminophen (TYLENOL) tablet 650 mg, 650 mg, Oral, Q6H PRN **OR** acetaminophen (TYLENOL) suppository 650 mg, 650 mg, Rectal, Q6H PRN, Rise Patience, MD, 650 mg at 04/19/20  2358 .  [START ON 04/21/2020] cefTRIAXone (ROCEPHIN) 1 g in sodium chloride 0.9 % 100 mL IVPB, 1 g, Intravenous, Q24H, Kakrakandy, Arshad N, MD .  enoxaparin (LOVENOX) injection 40 mg, 40 mg, Subcutaneous, QHS, Rise Patience, MD, 40 mg at 04/19/20 2244 .  levETIRAcetam (KEPPRA) 750 mg in sodium chloride 0.9 % 100 mL IVPB, 750 mg, Intravenous, Daily, Leonel Ramsay, Vida Roller, MD .  levETIRAcetam (KEPPRA) IVPB 1000 mg/100 mL premix, 1,000 mg, Intravenous, QHS, Rise Patience, MD .  levothyroxine (SYNTHROID, LEVOTHROID) injection 44 mcg, 44 mcg, Intravenous, Daily, Rise Patience, MD Labs CBC    Component Value Date/Time   WBC 10.4 04/20/2020 0427   RBC 3.91 04/20/2020 0427   HGB 11.0 (L) 04/20/2020 0427   HGB 12.1 02/23/2009 1156   HCT 34.7 (L) 04/20/2020 0427   HCT 34.7 (L) 02/23/2009 1156   PLT 180 04/20/2020 0427   PLT 273 02/23/2009 1156   MCV 88.7 04/20/2020 0427   MCV 80 (L) 02/23/2009 1156   MCH 28.1 04/20/2020 0427   MCHC 31.7 04/20/2020 0427   RDW 15.0 04/20/2020 0427   RDW 14.9 (H) 02/23/2009 1156   LYMPHSABS 1.7 12/10/2019 2000   LYMPHSABS 2.5 02/23/2009 1156   MONOABS 0.5 12/10/2019 2000   EOSABS 0.1 12/10/2019 2000   EOSABS 0.2 02/23/2009 1156   BASOSABS 0.0 12/10/2019 2000   BASOSABS 0.1 02/23/2009 1156    CMP     Component Value Date/Time   NA 140 04/20/2020 0427   K 3.8 04/20/2020 0427   CL 103 04/20/2020 0427  CO2 26 04/20/2020 0427   GLUCOSE 80 04/20/2020 0427   BUN 20 04/20/2020 0427   CREATININE 0.68 04/20/2020 0427   CREATININE 0.92 06/25/2018 1513   CALCIUM 9.1 04/20/2020 0427   PROT 7.0 12/10/2019 1829   ALBUMIN 4.2 12/10/2019 1829   AST 39 12/10/2019 1829   ALT 31 12/10/2019 1829   ALKPHOS 106 12/10/2019 1829   BILITOT 0.7 12/10/2019 1829   GFRNONAA >60 04/20/2020 0427   GFRNONAA 63 06/25/2018 1513   GFRAA >60 01/01/2020 0500   GFRAA 73 06/25/2018 1513    glycosylated hemoglobin  Lipid Panel     Component Value  Date/Time   CHOL 234 (H) 05/18/2015 1224   TRIG 199 (H) 05/18/2015 1224   HDL 57 05/18/2015 1224   CHOLHDL 4.1 05/18/2015 1224   VLDL 40 (H) 05/18/2015 1224   LDLCALC 137 (H) 05/18/2015 1224   LDLDIRECT 164.0 07/08/2014 1025     Imaging I have reviewed images in epic and the results pertinent to this consultation are: CT Head showed no acute abnormality. Age related trophy and minimal chronic small vessel change of deep white matter.   Assessment: 74 yo female with a hx of seizure disorder, dementia, and hypothyroidism who presented to the ED on 04/19/20 with a witnessed seizre lasting around 5 minutes with post ictal state/encephalopathy following event. Pt was diagnosed with UTI which may have been responsible for lowering her seizure threshold. She was loaded with Keppra 1 gram and her Keppra dosing (on at home) was increased to 750mg  in a.m. and 1000mg  at night. She is presently receiving her meds IV because she remains NPO.   Impression:  Breakthrough seizure likely due to UTI.  Dementia Unusually long post ictal states likely due to dementia.   Recommendations: 1. Continue increased Keppra dosing.  2. Watch clinically for seizure activity.  3. Continue residential memory unit.  4. No other testing recommended by neuro.  5. Neuro will sign off. Call with questions.   [place] Pager Number [phone]

## 2020-04-20 NOTE — Evaluation (Signed)
Physical Therapy Evaluation Patient Details Name: Stacy Davis MRN: [id number] DOB: August 23, 1945 Today's Date: 04/20/2020   History of Present Illness  Brief Narrative / Interim history:74 year old female with history of dementia, seizure disorder, hypothyroidism and anxiety brought to ED from memory care center with a seizure that has lasted about 5 minutes.  Workup also reveals UTI  Clinical Impression  Patient present with dementia/confusion limiting ability to participate in therapy.  Unsure of her baseline - will reach out to memory care unit for baseline.  Will benefit from PT to assist in returning to her baseline for mobility.      Follow Up Recommendations SNF (back to memory care center)    Equipment Recommendations  Other (comment) (tbd wiht more mobility)    Recommendations for Other Services       Precautions / Restrictions Precautions Precautions: Fall Restrictions Weight Bearing Restrictions: No      Mobility  Bed Mobility Overal bed mobility: Needs Assistance Bed Mobility: Supine to Sit;Sit to Supine     Supine to sit: Max assist Sit to supine: Max assist   General bed mobility comments: Patient with limited participation due tio confusion    Transfers                    Ambulation/Gait                Stairs            Wheelchair Mobility    Modified Rankin (Stroke Patients Only)       Balance Overall balance assessment: Needs assistance Sitting-balance support: No upper extremity supported;Feet supported Sitting balance-Leahy Scale: Good Sitting balance - Comments: sitting balance variable.  At times able to reach and move without loss of balance and at times, leaning posteriorly requiring cues to return to midline                                     Pertinent Vitals/Pain Pain Assessment: Faces Faces Pain Scale: Hurts a little bit Pain Intervention(s): Limited activity within patient's tolerance     Home Living Family/patient expects to be discharged to:: Skilled nursing facility                 Additional Comments: no family present for history, functional mobility, etc.    Prior Function Level of Independence: Needs assistance         Comments: Pt unable to provide hx; from memory unit     Hand Dominance        Extremity/Trunk Assessment                Communication   Communication: Expressive difficulties (speaking but not making sense)  Cognition Arousal/Alertness: Awake/alert Behavior During Therapy: Anxious Overall Cognitive Status: No family/caregiver present to determine baseline cognitive functioning                                        General Comments      Exercises     Assessment/Plan    PT Assessment Patient needs continued PT services  PT Problem List Decreased strength;Decreased range of motion;Decreased activity tolerance;Decreased balance;Decreased mobility       PT Treatment Interventions Gait training;Functional mobility training;Therapeutic activities;Therapeutic exercise;Balance training    PT Goals (Current goals can be found in the  Care Plan section)  Acute Rehab PT Goals Patient Stated Goal: none stated PT Goal Formulation: Patient unable to participate in goal setting Time For Goal Achievement: 04/29/20 Potential to Achieve Goals: Fair    Frequency Min 2X/week   Barriers to discharge        Co-evaluation               AM-PAC PT "6 Clicks" Mobility  Outcome Measure Help needed turning from your back to your side while in a flat bed without using bedrails?: A Lot Help needed moving from lying on your back to sitting on the side of a flat bed without using bedrails?: A Lot Help needed moving to and from a bed to a chair (including a wheelchair)?: A Lot Help needed standing up from a chair using your arms (e.g., wheelchair or bedside chair)?: A Lot Help needed to walk in hospital  room?: A Lot Help needed climbing 3-5 steps with a railing? : A Lot 6 Click Score: 12    End of Session   Activity Tolerance: Patient tolerated treatment well Patient left: in bed;with call bell/phone within reach;with bed alarm set Nurse Communication: Mobility status PT Visit Diagnosis: Other abnormalities of gait and mobility (R26.89)    Time: 1510-1530 PT Time Calculation (min) (ACUTE ONLY): 20 min   Charges:   PT Evaluation $PT Eval Moderate Complexity: 1 Mod          04/20/2020 Margie, PT Acute Rehabilitation Services Pager:  [phone] Office:  [place], [place] 04/20/2020, 3:33 PM

## 2020-04-21 DIAGNOSIS — N390 Urinary tract infection, site not specified: Secondary | ICD-10-CM

## 2020-04-21 DIAGNOSIS — F0390 Unspecified dementia without behavioral disturbance: Secondary | ICD-10-CM

## 2020-04-21 DIAGNOSIS — G40919 Epilepsy, unspecified, intractable, without status epilepticus: Secondary | ICD-10-CM

## 2020-04-21 DIAGNOSIS — F419 Anxiety disorder, unspecified: Secondary | ICD-10-CM

## 2020-04-21 LAB — IRON AND TIBC
Iron: 46 ug/dL (ref 28–170)
Saturation Ratios: 17 % (ref 10.4–31.8)
TIBC: 270 ug/dL (ref 250–450)
UIBC: 224 ug/dL

## 2020-04-21 LAB — FOLATE: Folate: 8.7 ng/mL (ref >5.9)

## 2020-04-21 LAB — VITAMIN B12: Vitamin B-12: 189 pg/mL (ref 180–914)

## 2020-04-21 LAB — RETICULOCYTES
Immature Retic Fract: 7.7 % (ref 2.3–15.9)
RBC.: 3.86 MIL/uL — ABNORMAL LOW (ref 3.87–5.11)
Retic Count, Absolute: 27.8 10*3/uL (ref 19.0–186.0)
Retic Ct Pct: 0.7 % (ref 0.4–3.1)

## 2020-04-21 LAB — GLUCOSE, CAPILLARY
Glucose-Capillary: 75 mg/dL (ref 70–99)
Glucose-Capillary: 119 mg/dL — ABNORMAL HIGH (ref 70–99)
Glucose-Capillary: 95 mg/dL (ref 70–99)
Glucose-Capillary: 87 mg/dL (ref 70–99)

## 2020-04-21 LAB — FERRITIN: Ferritin: 111 ng/mL (ref 11–307)

## 2020-04-21 MED ORDER — LEVETIRACETAM 750 MG PO TABS
750.0000 mg | ORAL_TABLET | Freq: Every morning | ORAL | Status: DC
  Administered 2020-04-21 – 2020-04-22 (×2): 750 mg via ORAL
  Filled 2020-04-21: qty 1

## 2020-04-21 MED ORDER — LEVOTHYROXINE SODIUM 88 MCG PO TABS
88.0000 ug | ORAL_TABLET | Freq: Every day | ORAL | Status: DC
  Administered 2020-04-21 – 2020-04-22 (×2): 88 ug via ORAL
  Filled 2020-04-21 (×2): qty 1

## 2020-04-21 MED ORDER — QUETIAPINE FUMARATE 25 MG PO TABS
25.0000 mg | ORAL_TABLET | Freq: Every evening | ORAL | Status: DC
  Administered 2020-04-21: 25 mg via ORAL
  Filled 2020-04-21: qty 1

## 2020-04-21 MED ORDER — BUSPIRONE HCL 10 MG PO TABS
5.0000 mg | ORAL_TABLET | Freq: Two times a day (BID) | ORAL | Status: DC
  Administered 2020-04-21 – 2020-04-22 (×3): 5 mg via ORAL
  Filled 2020-04-21 (×3): qty 1

## 2020-04-21 MED ORDER — LORAZEPAM 0.5 MG PO TABS: 0.5000 mg | ORAL_TABLET | Freq: Two times a day (BID) | ORAL | Status: DC | PRN

## 2020-04-21 MED ORDER — LEVETIRACETAM 500 MG PO TABS
1000.0000 mg | ORAL_TABLET | Freq: Every day | ORAL | Status: DC
  Administered 2020-04-21: 1000 mg via ORAL
  Filled 2020-04-21 (×2): qty 2

## 2020-04-21 MED ORDER — SERTRALINE HCL 100 MG PO TABS
100.0000 mg | ORAL_TABLET | Freq: Every day | ORAL | Status: DC
  Administered 2020-04-21 – 2020-04-22 (×2): 100 mg via ORAL
  Filled 2020-04-21 (×2): qty 1

## 2020-04-21 NOTE — Progress Notes (Signed)
Pt's Hr was sustained between low 30s and 40s. Check on Pt,  she was [place], and verbal. Notified [facility] on call via page

## 2020-04-21 NOTE — Evaluation (Signed)
Occupational Therapy Evaluation Patient Details Name: Stacy Davis MRN: [id number] DOB: April 06, 1946 Today's Date: 04/21/2020    History of Present Illness Brief Narrative / Interim history:74 year old female with history of dementia, seizure disorder, hypothyroidism and anxiety brought to ED from memory care center with a seizure that has lasted about 5 minutes.  Workup also reveals UTI   Clinical Impression   PT admitted with UTI(+). Pt currently with functional limitiations due to the deficits listed below (see OT problem list). Pt currently with cognitive deficits with a known dementia history. Pt with hallucinations during session and uncertain if this is baseline. Pt currently unable to complete basic transfers without max(A).   Pt will benefit from skilled OT to increase their independence and safety with adls and balance to allow discharge SNF.     Follow Up Recommendations  SNF    Equipment Recommendations  Wheelchair (measurements OT);Wheelchair cushion (measurements OT);Hospital bed;Other (comment) (air mattress overlay / lift )    Recommendations for Other Services Other (comment) (palliative)     Precautions / Restrictions Precautions Precautions: Fall      Mobility Bed Mobility Overal bed mobility: Needs Assistance Bed Mobility: Supine to Sit;Sit to Supine     Supine to sit: Max assist Sit to supine: Max assist   General bed mobility comments: pt requires total (A ) to progress bil LE in and out of the bed. pt with total +2 with pad to slide up in the bed.     Transfers Overall transfer level: Needs assistance   Transfers: Sit to/from Stand Sit to Stand: Mod assist         General transfer comment: pt motivated to stand with attempt of RN to move pad behind patient. pt standing in more of a fight or flight response to the task being performed. pt prior to this with no initiation. pt static standing for 20 seconds    Balance Overall balance  assessment: Needs assistance Sitting-balance support: No upper extremity supported;Feet supported Sitting balance-Leahy Scale: Zero     Standing balance support: Bilateral upper extremity supported;During functional activity Standing balance-Leahy Scale: Zero                             ADL either performed or assessed with clinical judgement   ADL Overall ADL's : Needs assistance/impaired Eating/Feeding: Minimal assistance Eating/Feeding Details (indicate cue type and reason): drinking from cup with straw during session Grooming: Maximal assistance               Lower Body Dressing: Total assistance Lower Body Dressing Details (indicate cue type and reason): pt holding sock and states "what do i do with it" pt with max cues just looking upward to celining               General ADL Comments: pt self reports name is "Stacy Davis" pt with name call making central eye contact.      Vision   Additional Comments: upward gaze     Perception     Praxis      Pertinent Vitals/Pain Pain Assessment: No/denies pain     Hand Dominance Right   Extremity/Trunk Assessment Upper Extremity Assessment Upper Extremity Assessment: Generalized weakness   Lower Extremity Assessment Lower Extremity Assessment: Defer to PT evaluation   Cervical / Trunk Assessment Cervical / Trunk Assessment: Kyphotic   Communication Communication Communication: Expressive difficulties   Cognition Arousal/Alertness: Awake/alert Behavior During Therapy: Flat affect Overall Cognitive  Status: No family/caregiver present to determine baseline cognitive functioning                                 General Comments: pt reports the chair is a ramp, pt talking about objects not present in room. pt tangential at times. pt with upward gaze at objects not in room   General Comments  high risk for skin break down due to body habitus and decrease mobility. recommend position changes  frequently to prevent skin break down    Exercises     Shoulder Instructions      Home Living Family/patient expects to be discharged to:: Skilled nursing facility                                 Additional Comments: no family present to obtain history and pt poor historian      Prior Functioning/Environment Level of Independence: Needs assistance                 OT Problem List: Decreased strength;Decreased activity tolerance;Impaired balance (sitting and/or standing);Decreased cognition;Decreased safety awareness;Decreased coordination;Decreased knowledge of use of DME or AE;Decreased knowledge of precautions      OT Treatment/Interventions: Self-care/ADL training;Therapeutic exercise;Neuromuscular education;Energy conservation;DME and/or AE instruction;Manual therapy;Modalities;Therapeutic activities;Cognitive remediation/compensation;Visual/perceptual remediation/compensation;Patient/family education;Balance training    OT Goals(Current goals can be found in the care plan section) Acute Rehab OT Goals Patient Stated Goal: none noted OT Goal Formulation: Patient unable to participate in goal setting Time For Goal Achievement: 05/05/20 Potential to Achieve Goals: Good  OT Frequency: Min 2X/week   Barriers to D/C:            Co-evaluation              AM-PAC OT "6 Clicks" Daily Activity     Outcome Measure Help from another person eating meals?: A Lot Help from another person taking care of personal grooming?: A Lot Help from another person toileting, which includes using toliet, bedpan, or urinal?: Total Help from another person bathing (including washing, rinsing, drying)?: Total Help from another person to put on and taking off regular upper body clothing?: A Lot Help from another person to put on and taking off regular lower body clothing?: Total 6 Click Score: 9   End of Session Equipment Utilized During Treatment: Gait belt Nurse  Communication: Mobility status;Precautions  Activity Tolerance: Patient tolerated treatment well Patient left: in bed;with call bell/phone within reach;with bed alarm set;Other (comment) (wedge under knees to help with positioning)  OT Visit Diagnosis: Unsteadiness on feet (R26.81);Muscle weakness (generalized) (M62.81)                Time: 1040-1100 OT Time Calculation (min): 20 min Charges:  OT General Charges $OT Visit: 1 Visit OT Evaluation $OT Eval Moderate Complexity: 1 Mod   B[place]nn, OTR/L  Acute Rehabilitation Services Pager: [phone] Office: [phone] .   Jeri Modena 04/21/2020, 11:09 AM

## 2020-04-21 NOTE — Progress Notes (Signed)
PROGRESS NOTE  STATIA ROUTLEDGE ZOX:[id number] DOB: Jun 18, 1945   PCP: Donita Brooks, MD  Patient is from: Memory care center  DOA: 04/19/2020 LOS: 1  Chief complaints: Seizure  Brief Narrative / Interim history: 74 year old female with history of dementia, seizure disorder, hypothyroidism and anxiety brought to ED from memory care center with a seizure that has lasted about 5 minutes.   In ED, hemodynamically stable.  On room air.  She was somewhat lethargic and postictal.  Neurology consulted I recommended increasing her Keppra from 500 mg to 750 mg twice daily.  She was loaded with Keppra 1 g as well.  WBC 13.1.  BMP, CXR and CT head without significant finding.  UA concerning for UTI.  Urine culture sent.  Started on ceftriaxone.  Encephalopathy seems to have resolved.  Neurology recommended continuing current dose of Keppra and signed off.  Urine culture with GNR.  Therapy recommended SNF.   Subjective: Seen and examined earlier this morning.  No major events overnight of this morning.  She is awake and alert.  Only oriented to self.  Follows some commands.  Does not appear to be in distress.  Objective: Vitals:   04/20/20 2028 04/20/20 2356 04/21/20 0330 04/21/20 0808  BP: 117/64 93/76 (!) 105/54 110/68  Pulse: (!) 58 (!) 58 (!) 55 61  Resp: 18 17 18 18   Temp: 97.7 F (36.5 C) (!) 97.3 F (36.3 C) (!) 97.4 F (36.3 C) (!) 97.3 F (36.3 C)  TempSrc: Oral Oral Oral Oral  SpO2: 98% 98% 100% 100%  Weight:      Height:        Intake/Output Summary (Last 24 hours) at 04/21/2020 1058 Last data filed at 04/21/2020 0533 Gross per 24 hour  Intake 1146.98 ml  Output --  Net 1146.98 ml   Filed Weights   04/19/20 1755  Weight: 52 kg    Examination:  GENERAL: Frail looking elderly female.  Nontoxic. HEENT: MMM.  Vision and hearing grossly intact.  NECK: Supple.  No apparent JVD.  RESP: On RA.  No IWOB.  Fair aeration bilaterally. CVS:  RRR. Heart sounds normal.   ABD/GI/GU: BS+. Abd soft, NTND.  MSK/EXT:  Moves extremities. No apparent deformity. No edema.  SKIN: no apparent skin lesion or wound NEURO: Awake and alert.  Oriented to self.  Follows some commands. PSYCH: Calm. Normal affect.   Procedures:  None  Microbiology summarized: COVID-19 and influenza PCR nonreactive. Urine culture with GNR.  Assessment & Plan: Breakthrough seizure: she is awake and alert but disoriented and not able to follow commands.  Seizure might have been provoked by UTI.  She is also on tramadol at home which could lower his seizure threshold. -Continue Keppra 750 mg in the morning and 1000 mg at night per neurology. -Discontinue tramadol indefinitely -Seizure and fall precautions. -Manage UTI as below.  Acute encephalopathy: Likely prolonged postictal state in the setting of dementia.  She is alert and awake.  Oriented to self which might be her baseline.  Follows some commands.  No apparent focal neuro deficit. -Treat treatable causes -Reorientation and delirium precautions  Dementia without behavioral disturbance-awake, alert and oriented to self only.  No agitation or distress. -Reorientation and delirium precautions.  Possible UTI: UA with nitrites. Mild leukocytosis but no fever.  Urine culture with GNR. -Continue IV ceftriaxone -Follow urine culture speciation and sensitivity  Hypothyroidism: TSH within normal. -Resume home p.o. Synthroid  Anxiety: Stable. -Resume home meds  Goal of care: Patient  has DNR/DNI discharge at bedside. -Changed CODE STATUS to DNR/DNI.  Body mass index is 20.31 kg/m.         DVT prophylaxis:  enoxaparin (LOVENOX) injection 40 mg Start: 04/19/20 2230  Code Status: DNR/DNI Family Communication: Patient and/or RN.  Attempted to call patient's son for update but no answer on both phones.  Did not leave voicemail.  Status is: Inpatient  Remains inpatient appropriate because:Unsafe d/c plan, IV treatments  appropriate due to intensity of illness or inability to take PO and Inpatient level of care appropriate due to severity of illness   Dispo: The patient is from: Memory care center              Anticipated d/c is to: SNF              Anticipated d/c date is: 1 day              Patient currently is not medically stable to d/c.           Consultants:  Neurology-signed off   Sch Meds:  Scheduled Meds: . busPIRone  5 mg Oral BID  . enoxaparin (LOVENOX) injection  40 mg Subcutaneous QHS  . levETIRAcetam  1,000 mg Oral QHS  . levETIRAcetam  750 mg Oral q AM  . levothyroxine  88 mcg Oral Q0600  . QUEtiapine  25 mg Oral QPM  . sertraline  100 mg Oral Daily   Continuous Infusions: . cefTRIAXone (ROCEPHIN)  IV 1 g (04/21/20 0533)  . dextrose 5 % and 0.9% NaCl 50 mL/hr at 04/21/20 0310   PRN Meds:.acetaminophen **OR** acetaminophen, LORazepam  Antimicrobials: Anti-infectives (From admission, onward)   Start     Dose/Rate Route Frequency Ordered Stop   04/21/20 0600  cefTRIAXone (ROCEPHIN) 1 g in sodium chloride 0.9 % 100 mL IVPB        1 g 200 mL/hr over 30 Minutes Intravenous Every 24 hours 04/20/20 0628     04/20/20 0645  cefTRIAXone (ROCEPHIN) 1 g in sodium chloride 0.9 % 100 mL IVPB        1 g 200 mL/hr over 30 Minutes Intravenous  Once 04/20/20 0630 04/20/20 0729       I have personally reviewed the following labs and images: CBC: Recent Labs  Lab 04/19/20 1757 04/20/20 0427  WBC 13.1* 10.4  HGB 12.5 11.0*  HCT 39.8 34.7*  MCV 88.4 88.7  PLT 209 180   BMP &GFR Recent Labs  Lab 04/19/20 1757 04/20/20 0427  NA 138 140  K 4.0 3.8  CL 101 103  CO2 22 26  GLUCOSE 143* 80  BUN 21 20  CREATININE 0.77 0.68  CALCIUM 9.4 9.1   Estimated Creatinine Clearance: 52.2 mL/min (by C-G formula based on SCr of 0.68 mg/dL). Liver & Pancreas: No results for input(s): AST, ALT, ALKPHOS, BILITOT, PROT, ALBUMIN in the last 168 hours. No results for input(s): LIPASE,  AMYLASE in the last 168 hours. No results for input(s): AMMONIA in the last 168 hours. Diabetic: No results for input(s): HGBA1C in the last 72 hours. Recent Labs  Lab 04/19/20 2251 04/19/20 2339 04/20/20 0412 04/20/20 0753 04/21/20 0623  GLUCAP 108* 97 81 80 75   Cardiac Enzymes: No results for input(s): CKTOTAL, CKMB, CKMBINDEX, TROPONINI in the last 168 hours. No results for input(s): PROBNP in the last 8760 hours. Coagulation Profile: No results for input(s): INR, PROTIME in the last 168 hours. Thyroid Function Tests: Recent Labs    04/20/20  1643  TSH 0.583   Lipid Profile: No results for input(s): CHOL, HDL, LDLCALC, TRIG, CHOLHDL, LDLDIRECT in the last 72 hours. Anemia Panel: Recent Labs    04/21/20 0336  VITAMINB12 189  FOLATE 8.7  FERRITIN 111  TIBC 270  IRON 46  RETICCTPCT 0.7   Urine analysis:    Component Value Date/Time   COLORURINE YELLOW 04/20/2020 0008   APPEARANCEUR HAZY (A) 04/20/2020 0008   LABSPEC 1.020 04/20/2020 0008   PHURINE 6.0 04/20/2020 0008   GLUCOSEU NEGATIVE 04/20/2020 0008   HGBUR NEGATIVE 04/20/2020 0008   BILIRUBINUR NEGATIVE 04/20/2020 0008   BILIRUBINUR negative 06/10/2012 1631   KETONESUR NEGATIVE 04/20/2020 0008   PROTEINUR NEGATIVE 04/20/2020 0008   UROBILINOGEN negative 06/10/2012 1631   NITRITE POSITIVE (A) 04/20/2020 0008   LEUKOCYTESUR TRACE (A) 04/20/2020 0008   Sepsis Labs: Invalid input(s): PROCALCITONIN, LACTICIDVEN  Microbiology: Recent Results (from the past 240 hour(s))  Resp Panel by RT-PCR (Flu A&B, Covid) Nasopharyngeal Swab     Status: None   Collection Time: 04/19/20 11:21 PM   Specimen: Nasopharyngeal Swab; Nasopharyngeal(NP) swabs in vial transport medium  Result Value Ref Range Status   SARS Coronavirus 2 by RT PCR NEGATIVE NEGATIVE Final    Comment: (NOTE) SARS-CoV-2 target nucleic acids are NOT DETECTED.  The SARS-CoV-2 RNA is generally detectable in upper respiratory specimens during the  acute phase of infection. The lowest concentration of SARS-CoV-2 viral copies this assay can detect is 138 copies/mL. A negative result does not preclude SARS-Cov-2 infection and should not be used as the sole basis for treatment or other patient management decisions. A negative result may occur with  improper specimen collection/handling, submission of specimen other than nasopharyngeal swab, presence of viral mutation(s) within the areas targeted by this assay, and inadequate number of viral copies(<138 copies/mL). A negative result must be combined with clinical observations, patient history, and epidemiological information. The expected result is Negative.  Fact Sheet for Patients:  BloggerCourse.com  Fact Sheet for Healthcare Providers:  SeriousBroker.it  This test is no t yet approved or cleared by the [place] FDA and  has been authorized for detection and/or diagnosis of SARS-CoV-2 by FDA under an Emergency Use Authorization (EUA). This EUA will remain  in effect (meaning this test can be used) for the duration of the COVID-19 declaration under Section 564(b)(1) of the Act, 21 U.S.C.section 360bbb-3(b)(1), unless the authorization is terminated  or revoked sooner.       Influenza A by PCR NEGATIVE NEGATIVE Final   Influenza B by PCR NEGATIVE NEGATIVE Final    Comment: (NOTE) The Xpert Xpress SARS-CoV-2/FLU/RSV plus assay is intended as an aid in the diagnosis of influenza from Nasopharyngeal swab specimens and should not be used as a sole basis for treatment. Nasal washings and aspirates are unacceptable for Xpert Xpress SARS-CoV-2/FLU/RSV testing.  Fact Sheet for Patients: BloggerCourse.com  Fact Sheet for Healthcare Providers: SeriousBroker.it  This test is not yet approved or cleared by the [place] FDA and has been authorized for detection and/or  diagnosis of SARS-CoV-2 by FDA under an Emergency Use Authorization (EUA). This EUA will remain in effect (meaning this test can be used) for the duration of the COVID-19 declaration under Section 564(b)(1) of the Act, 21 U.S.C. section 360bbb-3(b)(1), unless the authorization is terminated or revoked.  Performed at [facility], [address] [place], [place] [address]   Culture, Urine     Status: Abnormal (Preliminary result)   Collection Time: 04/20/20  6:29 AM   Specimen: Urine, Random  Result Value Ref Range Status   Specimen Description URINE, RANDOM  Final   Special Requests NONE  Final   Culture (A)  Final    >=100,000 COLONIES/mL GRAM NEGATIVE RODS SUSCEPTIBILITIES TO FOLLOW Performed at [facility], [address], [place], [place] [address]    Report Status PENDING  Incomplete    Radiology Studies: No results found.    Courvoisier Hamblen T. Keishla Oyer [facility]  If 7PM-7AM, please contact night-coverage [email or web address] 04/21/2020, 10:58 AM

## 2020-04-21 NOTE — TOC Initial Note (Addendum)
Transition of Care [facility]) - Initial/Assessment Note    Patient Details  Name: Stacy Davis MRN: [id number] Date of Birth: 1945/09/16  Transition [facility]) CM/SW Contact:    Coralee Pesa, [place] Phone Number: 04/21/2020, 12:43 PM  Clinical Narrative:                 CSW spoke with pt's son and he is agreeable for her to return to [facility]. Dr. Lenon Ahmadi she could DC tomorrow, Covid test ordered. Facility able to take tomorrow.  Rm # [phone] Call to report is [phone] Expected Discharge Plan: Memory Care Barriers to Discharge: Continued Medical Work up   Patient Goals and CMS Choice Patient states their goals for this hospitalization and ongoing recovery are:: Pt's family agreeable for her to return to [facility].gov Compare Post Acute Care list provided to:: Patient Represenative (must comment) Choice offered to / list presented to : Adult Children  Expected Discharge Plan and Services Expected Discharge Plan: Memory Care     Post Acute Care Choice: [place] Living arrangements for the past 2 months: [place]                                      Prior Living Arrangements/Services Living arrangements for the past 2 months: [place]   Patient language and need for interpreter reviewed:: Yes Do you feel safe going back to the place where you live?: Yes      Need for Family Participation in Patient Care: No (Comment) Care giver support system in place?: Yes (comment)   Criminal Activity/Legal Involvement Pertinent to Current Situation/Hospitalization: No - Comment as needed  Activities of Daily Living      Permission Sought/Granted Permission sought to share information with : Family Supports    Share Information with NAME: Keandrea Tapley     Permission granted to share info w Relationship: Son  Permission granted to share info w Contact Information: [phone]  Emotional  Assessment Appearance:: Other (Comment Required (Unable to Assess) Attitude/Demeanor/Rapport: Unable to Assess Affect (typically observed): Unable to Assess Orientation: :  (Unable to Assess) Alcohol / Substance Use: Not Applicable Psych Involvement: No (comment)  Admission diagnosis:  Seizure ([place]) [R56.9] Loss of consciousness ([place]) [R40.20] Post-ictal state ([place]) [R56.9] Breakthrough seizure ([place]) [G40.919] Patient Active Problem List   Diagnosis Date Noted  . Breakthrough seizure ([place]) 04/20/2020  . Seizure ([place]) 04/19/2020  . Fall at nursing home   . Palliative care by specialist   . Goals of care, counseling/discussion   . General weakness   . Trochanteric fracture ([place]) 01/01/2020  . Closed rib fracture 01/01/2020  . Anemia 01/01/2020  . Hip fx ([place]) 01/01/2020  . Orthostatic dizziness   . Dementia ([place])   . Seizures ([place]) 06/16/2018  . Subarachnoid hemorrhage ([place])   . Syncope 04/11/2018  . Incontinence of feces 01/08/2018  . Depression 10/08/2017  . Memory impairment   . Advance care planning 10/06/2014  . Joint pain 04/15/2013  . Advance directive on file 08/12/2012  . Confusion 06/10/2012  . Right rotator cuff tear 05/15/2012  . Shoulder pain 05/03/2012  . Insomnia 08/13/2011  . 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 HTN (hypertension) 07/18/2011  . HLD (hyperlipidemia) 07/18/2011  . Memory loss 07/18/2011  . Hypothyroid 07/18/2011  . SVT (supraventricular tachycardia) ([place]) 07/18/2011  .  Von Willebrand disease ([place]) 07/18/2011  . GERD (gastroesophageal reflux disease) 07/18/2011  . Back pain 07/18/2011  . Fatty liver 07/18/2011   PCP:  Susy Frizzle, MD Pharmacy:   [facility], [place] [address] Phone: [phone] Fax: [phone]  [facility] [place], [place] - [address] [place] [address] Phone: [phone] Fax: [phone]     Social Determinants of Health (SDOH) Interventions    Readmission Risk Interventions No flowsheet data found.

## 2020-04-21 NOTE — Plan of Care (Signed)
No seizure activity apparent.

## 2020-04-21 NOTE — Evaluation (Signed)
Clinical/Bedside Swallow Evaluation Patient Details  Name: Stacy Davis MRN: [id number] Date of Birth: 10/13/1945  Today's Date: 04/21/2020 Time: SLP Start Time (ACUTE ONLY): 0845 SLP Stop Time (ACUTE ONLY): 0900 SLP Time Calculation (min) (ACUTE ONLY): 15 min  Past Medical History:  Past Medical History:  Diagnosis Date  . Anxiety   . Arthritis    ?OA vs Rheum (established with Dr. Jefm Bryant pending blood work)  . Articular cartilage disorder of shoulder 04/2012   right  . Back pain    h/o DDD since 2010  . Basal cell carcinoma    right dorsum nose, right forhead  . Dementia ([place])   . Dementia ([place])   . Dental crowns present    also dental caps  . Diabetes mellitus    NIDDM  . Fecal incontinence   . GERD (gastroesophageal reflux disease)   . Headache(784.0)    occasional  . History of skin cancer   . Hyperlipidemia    no current med.  . Hypertension   . Hyperthyroidism   . Hypothyroid   . Impingement syndrome of right shoulder 04/2012  . Memory impairment    dementia- worsenign 12/2016 with medication non compliance and delusions.    . Orthostatic dizziness   . Right rotator cuff tear 05/15/2012  . Rotator cuff rupture 04/2012   right  . Seizures ([place])   . 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 Von Willebrand disease ([place])    Past Surgical History:  Past Surgical History:  Procedure Laterality Date  . APPENDECTOMY    . BREAST SURGERY     hematoma evacuation due to trauma  . CARDIAC CATHETERIZATION  03/06/2001  . CESAREAN SECTION     x 2  . COLONOSCOPY  01/20/2006 per patient  . FRACTURE SURGERY    . KNEE ARTHROSCOPY Right 12/01/2014   Procedure: ARTHROSCOPY KNEE,partial medial menisectomy and plica excision;  Surgeon: Hessie Knows, MD;  Location: [facility] OR[facility];  Service: Orthopedics;  Laterality: Right;  . LUMBAR LAMINECTOMY/DECOMPRESSION MICRODISCECTOMY  03/01/2009   right L4-5; arthrodesis L4-5  . ORIF WRIST FRACTURE     left  .  SHOULDER ARTHROSCOPY WITH ROTATOR CUFF REPAIR AND SUBACROMIAL DECOMPRESSION  05/15/2012   Procedure: SHOULDER ARTHROSCOPY WITH ROTATOR CUFF REPAIR AND SUBACROMIAL DECOMPRESSION;  Surgeon: Johnny Bridge, MD;  Location: [facility];  Service: Orthopedics;  Laterality: Right;  RIGHT ARTHROSCOPY SHOULDER DEBRIDEMENT LIMITED, ARTHROSCOPY SHOULDER DECOMPRESSION SUBACROMIAL PARTIAL ACROMIOPLASTY WITH CORACOACROMIAL RELEASE, ARTHROSCOPY SHOULDER WITH ROTATOR CUFF REPAIR  . TUBAL LIGATION     HPI:  Pt is a 74 year old female with history of dementia, seizure disorder, hypothyroidism and anxiety brought to ED from memory care center with a seizure that has lasted about 5 minutes.  Pt found to have UTI. CT head was negative for acute changes.    Assessment / Plan / Recommendation Clinical Impression  Pt was seen for bedside swallow evaluation. She was unable to provide history or consistently follow commands due to cognitive impairments. Oral mechanism exam was limited due to pt's difficulty following commands; however, oral motor strength and ROM appeared grossly WFL and dentition was adequate. She tolerated all solids and liquids without signs or symptoms of oropharyngeal dysphagia; however cueing was needed to direct pt towards the food. A regular texture diet with thin liquids may be continued at this time and further skilled SLP services are not clinically indicated for swallowing.  SLP Visit Diagnosis: Dysphagia, unspecified (R13.10)  Aspiration Risk  Mild aspiration risk    Diet Recommendation Regular;Thin liquid   Liquid Administration via: Cup;Straw Medication Administration: Crushed with puree Supervision: Patient able to self feed Compensations: Minimize environmental distractions Postural Changes: Seated upright at 90 degrees    Other  Recommendations Oral Care Recommendations: Oral care BID   Follow up Recommendations None      Frequency and Duration             Prognosis        Swallow Study   General Date of Onset: 04/20/20 HPI: Pt is a 74 year old female with history of dementia, seizure disorder, hypothyroidism and anxiety brought to ED from memory care center with a seizure that has lasted about 5 minutes.  Pt found to have UTI. CT head was negative for acute changes.  Type of Study: Bedside Swallow Evaluation Previous Swallow Assessment: None Diet Prior to this Study: Regular;Thin liquids Temperature Spikes Noted: No Respiratory Status: Room air History of Recent Intubation: No Behavior/Cognition: Alert;Pleasant mood;Confused Oral Cavity Assessment: Within Functional Limits Oral Care Completed by SLP: No Oral Cavity - Dentition: Adequate natural dentition Vision: Functional for self-feeding Self-Feeding Abilities: Able to feed self Patient Positioning: Upright in bed;Postural control adequate for testing Baseline Vocal Quality: Normal Volitional Cough: Cognitively unable to elicit Volitional Swallow: Unable to elicit    Oral/Motor/Sensory Function Overall Oral Motor/Sensory Function: Within functional limits   Ice Chips Ice chips: Not tested   Thin Liquid Thin Liquid: Within functional limits Presentation: Cup;Straw    Nectar Thick Nectar Thick Liquid: Not tested   Honey Thick Honey Thick Liquid: Not tested   Puree Puree: Within functional limits Presentation: Spoon   Solid     Solid: Within functional limits Presentation: [place] I. Hardin Negus, [place], [place] Office number [phone] Pager [phone]  Horton Marshall 04/21/2020,8:57 AM

## 2020-04-21 NOTE — Progress Notes (Signed)
[facility] Hospitalized patient note.  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  Patient was transport to [facility] ED on 04/19/2020 by EMS following a seizure that lasted approximately 5 minutes. Hospice was not notified of this until today 04/21/2020. Patient was admitted on 04/20/2020. Per Dr. Tomasa Hosteller with [facility] is a related hospital admission.   Received report to [facility] RN, TOC. Patient is alert, oriented to person, confused to situation, time and place. NAD. Not ambulatory and full assist for transfers. This patient is planned to return to [facility] 04/22/2020.    VS: 97.4, 134/80, 61, 16, 97% on RA I/O: not documented Abn Labs: 04/20/2020 04:27 Hemoglobin: 11.0 (L) HCT: 34.7 (L) 04/21/2020 03:36 RBC.: 3.86 (L)  IV/PRN Meds: Rocephin 1gm IVPB QD, D5/NS@ 37ml/hr  Problem list: Breakthrough seizure: -Seizure might have been provoked by UTI.  She is also on tramadol at home which could lower his seizure threshold. Acute encephalopathy -Likely prolonged postictal state in the setting of dementia Possible UTI  Discharge planning: Return to [place] Contact: Left VM for son IDG: updated GOC:  Clear. Return to [facility] with hospice   If ambulance transport is needed please use [facility] as they contract this service for [facility] active hospice patients.         Farrel Gordon, RN, CCM   [facility] Liaison (listed on [place] under Hospice/[facility])     [phone]

## 2020-04-22 ENCOUNTER — Emergency Department (HOSPITAL_COMMUNITY)
Admission: EM | Admit: 2020-04-22 | Discharge: 2020-04-22 | Disposition: A | Discharge status: Home / Self Care | Payer: PPO | Attending: Emergency Medicine | Admitting: Emergency Medicine

## 2020-04-22 ENCOUNTER — Emergency Department (HOSPITAL_COMMUNITY): Payer: PPO

## 2020-04-22 ENCOUNTER — Other Ambulatory Visit: Payer: Self-pay

## 2020-04-22 DIAGNOSIS — F0390 Unspecified dementia without behavioral disturbance: Secondary | ICD-10-CM | POA: Insufficient documentation

## 2020-04-22 DIAGNOSIS — S0003XA Contusion of scalp, initial encounter: Secondary | ICD-10-CM | POA: Diagnosis not present

## 2020-04-22 DIAGNOSIS — B9620 Unspecified Escherichia coli [E. coli] as the cause of diseases classified elsewhere: Secondary | ICD-10-CM

## 2020-04-22 DIAGNOSIS — I10 Essential (primary) hypertension: Secondary | ICD-10-CM

## 2020-04-22 DIAGNOSIS — Z79899 Other long term (current) drug therapy: Secondary | ICD-10-CM | POA: Insufficient documentation

## 2020-04-22 DIAGNOSIS — Y9269 Other specified industrial and construction area as the place of occurrence of the external cause: Secondary | ICD-10-CM | POA: Insufficient documentation

## 2020-04-22 DIAGNOSIS — W19XXXA Unspecified fall, initial encounter: Secondary | ICD-10-CM

## 2020-04-22 DIAGNOSIS — E1121 Type 2 diabetes mellitus with diabetic nephropathy: Secondary | ICD-10-CM | POA: Diagnosis not present

## 2020-04-22 DIAGNOSIS — S0101XA Laceration without foreign body of scalp, initial encounter: Secondary | ICD-10-CM | POA: Diagnosis not present

## 2020-04-22 DIAGNOSIS — E039 Hypothyroidism, unspecified: Secondary | ICD-10-CM

## 2020-04-22 DIAGNOSIS — Y9389 Activity, other specified: Secondary | ICD-10-CM | POA: Insufficient documentation

## 2020-04-22 DIAGNOSIS — Z85828 Personal history of other malignant neoplasm of skin: Secondary | ICD-10-CM | POA: Diagnosis not present

## 2020-04-22 DIAGNOSIS — S0083XA Contusion of other part of head, initial encounter: Secondary | ICD-10-CM | POA: Diagnosis not present

## 2020-04-22 DIAGNOSIS — Z7982 Long term (current) use of aspirin: Secondary | ICD-10-CM | POA: Insufficient documentation

## 2020-04-22 DIAGNOSIS — W07XXXA Fall from chair, initial encounter: Secondary | ICD-10-CM | POA: Insufficient documentation

## 2020-04-22 DIAGNOSIS — G9389 Other specified disorders of brain: Secondary | ICD-10-CM | POA: Diagnosis not present

## 2020-04-22 DIAGNOSIS — I6523 Occlusion and stenosis of bilateral carotid arteries: Secondary | ICD-10-CM | POA: Diagnosis not present

## 2020-04-22 LAB — URINE CULTURE: Culture: >=100000 — AB

## 2020-04-22 LAB — RESP PANEL BY RT-PCR (FLU A&B, COVID) ARPGX2
Influenza A by PCR: NEGATIVE
Influenza B by PCR: NEGATIVE
SARS Coronavirus 2 by RT PCR: NEGATIVE

## 2020-04-22 LAB — GLUCOSE, CAPILLARY
Glucose-Capillary: 99 mg/dL (ref 70–99)
Glucose-Capillary: 108 mg/dL — ABNORMAL HIGH (ref 70–99)

## 2020-04-22 MED ORDER — LEVETIRACETAM 250 MG PO TABS: ORAL_TABLET | ORAL | 11 refills | Status: AC

## 2020-04-22 MED ORDER — NITROFURANTOIN MONOHYD MACRO 100 MG PO CAPS: 100.0000 mg | ORAL_CAPSULE | Freq: Two times a day (BID) | ORAL | 0 refills | Status: AC

## 2020-04-22 NOTE — ED Provider Notes (Signed)
[facility] DEPT Provider Note   CSN: [id number] Arrival date & time: 04/22/20  1800     History Chief Complaint  Patient presents with   Stacy Davis is a 74 y.o. female with history of dementia presenting from memory unit with report for mechanical fall.  The patient had a witnessed fall when attempting to stand up from a wheelchair, falling forward onto her bilateral knees and striking her left forehead on the ground.  There is no loss of consciousness.  She is not on blood thinners.  She is normally wheelchair-bound.  Patient result is advanced and she cannot provide any additional history.  She denies to me that she has a headache or neck pain.  HPI     Past Medical History:  Diagnosis Date   Anxiety    Arthritis    ?OA vs Rheum (established with Dr. Jefm Bryant pending blood work)   Articular cartilage disorder of shoulder 04/2012   right   Back pain    h/o DDD since 2010   Basal cell carcinoma    right dorsum nose, right forhead   Dementia ([place])    Dementia ([place])    Dental crowns present    also dental caps   Diabetes mellitus    NIDDM   Fecal incontinence    GERD (gastroesophageal reflux disease)    Headache(784.0)    occasional   History of skin cancer    Hyperlipidemia    no current med.   Hypertension    Hyperthyroidism    Hypothyroid    Impingement syndrome of right shoulder 04/2012   Memory impairment    dementia- worsenign 12/2016 with medication non compliance and delusions.     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   left medial ceek   Von Willebrand disease ([place])     Patient Active Problem List   Diagnosis Date Noted   Breakthrough seizure ([place]) 04/20/2020   Seizure ([place]) 04/19/2020   Fall at nursing home    Palliative care by specialist     Goals of care, counseling/discussion    General weakness    Trochanteric fracture ([place]) 01/01/2020   Closed rib fracture 01/01/2020   Anemia 01/01/2020   Hip fx ([place]) 01/01/2020   Orthostatic dizziness    Dementia ([facility])    Seizures ([place]) 06/16/2018   Subarachnoid hemorrhage ([place])    Syncope 04/11/2018   Incontinence of feces 01/08/2018   Depression 10/08/2017   Memory impairment    Advance care planning 10/06/2014   Joint pain 04/15/2013   Advance directive on file 08/12/2012   Confusion 06/10/2012   Right rotator cuff tear 05/15/2012   Shoulder pain 05/03/2012   Insomnia 08/13/2011   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 07/18/2011   HTN (hypertension) 07/18/2011   HLD (hyperlipidemia) 07/18/2011   Memory loss 07/18/2011   Hypothyroid 07/18/2011   SVT (supraventricular tachycardia) ([place]) 07/18/2011   Von Willebrand disease ([place]) 07/18/2011   GERD (gastroesophageal reflux disease) 07/18/2011   Back pain 07/18/2011   Fatty liver 07/18/2011    Past Surgical History:  Procedure Laterality Date   APPENDECTOMY     BREAST SURGERY     hematoma evacuation due to trauma   CARDIAC CATHETERIZATION  03/06/2001   CESAREAN SECTION     x 2  COLONOSCOPY  01/20/2006 per patient   FRACTURE SURGERY     KNEE ARTHROSCOPY Right 12/01/2014   Procedure: ARTHROSCOPY KNEE,partial medial menisectomy and plica excision;  Surgeon: Hessie Knows, MD;  Location: [facility] OR[facility];  Service: Orthopedics;  Laterality: Right;   LUMBAR LAMINECTOMY/DECOMPRESSION MICRODISCECTOMY  03/01/2009   right L4-5; arthrodesis L4-5   ORIF WRIST FRACTURE     left   SHOULDER ARTHROSCOPY WITH ROTATOR CUFF REPAIR AND SUBACROMIAL DECOMPRESSION  05/15/2012   Procedure: SHOULDER ARTHROSCOPY WITH ROTATOR CUFF REPAIR AND SUBACROMIAL DECOMPRESSION;  Surgeon: Johnny Bridge, MD;  Location: [facility];  Service: Orthopedics;  Laterality: Right;  RIGHT  ARTHROSCOPY SHOULDER DEBRIDEMENT LIMITED, ARTHROSCOPY SHOULDER DECOMPRESSION SUBACROMIAL PARTIAL ACROMIOPLASTY WITH CORACOACROMIAL RELEASE, ARTHROSCOPY SHOULDER WITH ROTATOR CUFF REPAIR   TUBAL LIGATION       OB History   No obstetric history on file.     Family History  Problem Relation Age of Onset   Dementia Mother    Arthritis Mother    Cancer Father        unknown primary   Heart disease Father    Thyroid disease Sister    Cancer Sister        brain tumor   Dementia Sister    COPD Brother    Cancer Brother        bone cancer   Colon cancer Paternal Grandfather    Breast cancer Neg Hx     Social History   Tobacco Use   Smoking status: Never Smoker   Smokeless tobacco: Never Used  Vaping Use   Vaping Use: Never used  Substance Use Topics   Alcohol use: No    Alcohol/week: 0.0 standard drinks   Drug use: No    Home Medications Prior to Admission medications   Medication Sig Start Date End Date Taking? Authorizing Provider  acetaminophen (TYLENOL) 325 MG tablet Take 650 mg by mouth every 6 (six) hours as needed for mild pain.    [provider]  aspirin (ASPIRIN 81) 81 MG chewable tablet Chew 81 mg by mouth daily.    [provider]  busPIRone (BUSPAR) 5 MG tablet Take 5 mg by mouth 2 (two) times daily.     [provider]  feeding supplement ([place]) LIQD Take 237 mLs by mouth in the morning, at noon, and at bedtime.    [provider]  levETIRAcetam (KEPPRA) 250 MG tablet Take 3 tablets (750 mg total) by mouth in the morning AND 4 tablets (1,000 mg total) at bedtime. 04/22/20   Mercy Riding, MD  levothyroxine (SYNTHROID) 88 MCG tablet TAKE 1 TABLET BY MOUTH ONCE A DAY Patient taking differently: Take 88 mcg by mouth daily.  10/05/18   Susy Frizzle, MD  LORazepam (ATIVAN) 0.5 MG tablet Take 0.5 mg by mouth 2 (two) times daily as needed for anxiety.    [provider]  melatonin 3 MG  TABS tablet Take 3 mg by mouth at bedtime.    [provider]  nitrofurantoin, macrocrystal-monohydrate, (MACROBID) 100 MG capsule Take 1 capsule (100 mg total) by mouth 2 (two) times daily for 3 days. 04/22/20 04/25/20  Mercy Riding, MD  omeprazole (PRILOSEC) 40 MG capsule Take 40 mg by mouth daily.    [provider]  QUEtiapine (SEROQUEL) 25 MG tablet Take 1 tablet (25 mg total) by mouth at bedtime. Patient taking differently: Take 25 mg by mouth every evening.  05/05/19   Marcial Pacas, MD  sertraline (ZOLOFT) 100 MG tablet Take 100 mg by mouth daily.    [provider]  Sunscreens (COPPERTONE SPORT SPF30) LOTN Apply 1 application topically every 2 (two) hours as needed (While in the sun).    [provider]    Allergies    Tramadol  Review of Systems   Review of Systems  Unable to perform ROS: Dementia (level 5 caveat)    Physical Exam Updated Vital Signs BP 131/89    Pulse (!) 59    Temp 98.3 F (36.8 C) (Oral)    Resp 13    SpO2 99%   Physical Exam Vitals and nursing note reviewed.  Constitutional:      General: She is not in acute distress.    Appearance: She is well-developed.  HENT:     Head: Normocephalic.     Comments: Small laceration 0.5 cm in left eyebrow Eyes:     Conjunctiva/sclera: Conjunctivae normal.  Cardiovascular:     Rate and Rhythm: Normal rate and regular rhythm.  Pulmonary:     Effort: Pulmonary effort is normal. No respiratory distress.  Musculoskeletal:     Cervical back: Neck supple.     Comments: Superficial abrasion to bilateral knees with no isolated patellar or fibular tenderness, full passive ROM of both knees  Skin:    General: Skin is warm and dry.  Neurological:     Mental Status: She is alert.     ED Results / Procedures / Treatments   Labs (all labs ordered are listed, but only abnormal results are displayed) Labs Reviewed - No data to display  EKG None  Radiology CT Head Wo  Contrast  Result Date: 04/22/2020 CLINICAL DATA:  Dementia Fall forward out of wheelchair laceration to left forehead EXAM: CT HEAD WITHOUT CONTRAST TECHNIQUE: Contiguous axial images were obtained from the base of the skull through the vertex without intravenous contrast. COMPARISON:  CT head 04/19/2020. FINDINGS: Brain: Poor visualization of the gray-white matter differentiation diffusely limiting evaluation of the brain parenchyma. Cerebral ventricle sizes are concordant with the degree of cerebral volume loss. Patchy and confluent areas of decreased attenuation are noted throughout the deep and periventricular white matter of the cerebral hemispheres bilaterally, compatible with chronic microvascular ischemic disease. No evidence of large-territorial acute infarction. No parenchymal hemorrhage. No mass lesion. No extra-axial collection. No mass effect or midline shift. No hydrocephalus. Basilar cisterns are patent. Vascular: No hyperdense vessel. Atherosclerotic calcifications are present within the cavernous internal carotid arteries. Skull: No acute fracture or focal lesion. Sinuses/Orbits: Paranasal sinuses and mastoid air cells are clear. The orbits are unremarkable. Other: Left frontal/periorbital subcutaneus soft tissue edema and hematoma formation. IMPRESSION: No acute intracranial abnormality. Electronically Signed   By: Iven Finn M.D.   On: 04/22/2020 18:52    Procedures .Marland KitchenLaceration Repair  Date/Time: 04/22/2020 6:58 PM Performed by: Wyvonnia Dusky, MD Authorized by: Wyvonnia Dusky, MD   Consent:    Consent obtained:  Verbal   Consent given by:  Guardian   Risks discussed:  Poor cosmetic result   Alternatives discussed:  No treatment Anesthesia (see MAR for exact dosages):    Anesthesia method:  None Laceration details:    Location:  Scalp   Scalp location:  Frontal   Length (cm):  1   Depth (mm):  1 Repair type:    Repair type:  Simple Pre-procedure details:     Preparation:  Patient was prepped and draped in usual sterile fashion Exploration:  Hemostasis achieved with:  Direct pressure Treatment:    Area cleansed with:  Saline Skin repair:    Repair method:  Tissue adhesive Approximation:    Approximation:  Loose Post-procedure details:    Dressing:  Non-adherent dressing   Patient tolerance of procedure:  Tolerated well, no immediate complications   (including critical care time)  Medications Ordered in ED Medications - No data to display  ED Course  I have reviewed the triage vital signs and the nursing notes.  Pertinent labs & imaging results that were available during my care of the patient were reviewed by me and considered in my medical decision making (see chart for details).  This 74 year old female presenting from memory unit of a nursing home with report for a witnessed mechanical fall, from standing height will attempt to stand up from the wheelchair.  She is superficial abrasions to bilateral knees, but no tenderness and full range of motion.  Very low suspicion for fracture of the knees.   CTH pending given her age to evaluate for [place]  Will clean facial wounds - eyebrow laceration is too small for suturing.  Will try dermabond  Tdap up to date  No other evidence of acute fractures or injuries on exam  Clinical Course as of Apr 23 2203  Sat Apr 22, 2020  1855  IMPRESSION: No acute intracranial abnormality.   [MT]  2033 Dermabond of wound.  Okay for discharge.  Pending transfer back to facility.  I could not reach her son by phone.   [MT]    Clinical Course User Index [MT] Langston Masker Carola Rhine, MD   Final Clinical Impression(s) / ED Diagnoses Final diagnoses:  Contusion of face, initial encounter  Fall, initial encounter    Rx / DC Orders ED Discharge Orders    None       Alexxa Sabet, Carola Rhine, MD 04/22/20 2205

## 2020-04-22 NOTE — ED Notes (Signed)
[facility] called for pt transport to [facility].

## 2020-04-22 NOTE — Discharge Summary (Signed)
Physician Discharge Summary  Stacy Davis QJJ:[id number] DOB: 1945-11-18 DOA: 04/19/2020  PCP: Susy Frizzle, MD  Admit date: 04/19/2020 Discharge date: 04/22/2020  Admitted From: Memory care center Disposition: SNF at the same memory care center  Recommendations for Outpatient Follow-up:  1. Follow ups as below. 2. Please obtain CBC/BMP/Mag at follow up 3. Please follow up on the following pending results: None   Discharge Condition: Stable CODE STATUS: DNR/DNI   Hospital Course: 74 year old female with history of dementia, seizure disorder, hypothyroidism and anxiety brought to ED from memory care center with a seizure that has lasted about 5 minutes.   In ED, hemodynamically stable.  On room air.  She was somewhat lethargic and postictal.  Neurology consulted I recommended increasing her Keppra from 500 mg to 750 mg twice daily.  She was loaded with Keppra 1 g as well.  WBC 13.1.  BMP, CXR and CT head without significant finding.  UA concerning for UTI.  Urine culture sent.  Started on ceftriaxone.  Encephalopathy seems to have resolved.  Neurology recommended continuing current dose of Keppra and signed off.    Urine culture grew multidrug-resistant E. coli including ceftriaxone but sensitive to Macrobid.  Surprisingly, she no longer had UTI signs but decided to discharge on Macrobid for 3 days as she is not a reliable historian.  Therapy recommended SNF.  Discharge Diagnoses:  Breakthrough seizure: she is awake and alert but disoriented and not able to follow commands.  Seizure might have been provoked by UTI.  She is also on tramadol at home which could lower his seizure threshold. -Continue Keppra 750 mg in the morning and 1000 mg at night per neurology. -Discontinue tramadol indefinitely -Seizure and fall precautions. -Manage UTI as below.  Acute encephalopathy: Likely prolonged postictal state in the setting of dementia.  She is alert and awake.  Oriented  to self which might be her baseline.  Follows some commands.  No apparent focal neuro deficit. -Treat treatable causes -Reorientation and delirium precautions  Dementia without behavioral disturbance-awake, alert and oriented to self only.  No agitation or distress. -Reorientation and delirium precautions.  Possible E. coli UTI: UA with nitrites. Mild leukocytosis but no fever.   Urine culture with multidrug-resistant E. coli including ceftriaxone but sensitive to Macrobid. -Discharged on Macrobid antibiotic twice daily for 3 days.  Hypothyroidism: TSH within normal. -Continue home Synthroid.  Anxiety: Stable. -Resume home meds  Goal of care: Patient has DNR/DNI discharge at bedside. -DNR/DNI   Body mass index is 20.31 kg/m.            Discharge Exam: Vitals:   04/22/20 0341 04/22/20 0841  BP: (!) 131/94 118/88  Pulse: 71 64  Resp: 18 16  Temp: (!) 97.4 F (36.3 C) 97.7 F (36.5 C)  SpO2: 100%     GENERAL: No apparent distress.  Nontoxic. HEENT: MMM.  Vision and hearing grossly intact.  NECK: Supple.  No apparent JVD.  RESP:  No IWOB.  Fair aeration bilaterally. CVS:  RRR. Heart sounds normal.  ABD/GI/GU: Bowel sounds present. Soft. Non tender.  MSK/EXT:  Moves extremities. No apparent deformity. No edema.  SKIN: no apparent skin lesion or wound NEURO: Awake and alert.  Only oriented to self.  Follows some commands.  No apparent focal neuro deficit. PSYCH: Calm. Normal affect.  Discharge Instructions  Discharge Instructions    Diet general   Complete by: As directed    Increase activity slowly   Complete by: As directed  Allergies as of 04/22/2020      Reactions   Tramadol Other (See Comments)   CHANGES IN MEMORY      Medication List    STOP taking these medications   levETIRAcetam 500 MG 24 hr tablet Commonly known as: Keppra XR Replaced by: levETIRAcetam 250 MG tablet     TAKE these medications   acetaminophen 325 MG  tablet Commonly known as: TYLENOL Take 650 mg by mouth every 6 (six) hours as needed for mild pain.   Aspirin 81 81 MG chewable tablet Generic drug: aspirin Chew 81 mg by mouth daily.   busPIRone 5 MG tablet Commonly known as: BUSPAR Take 5 mg by mouth 2 (two) times daily.   Coppertone Sport SPF30 Lotn Apply 1 application topically every 2 (two) hours as needed (While in the sun).   feeding supplement Liqd Take 237 mLs by mouth in the morning, at noon, and at bedtime.   levETIRAcetam 250 MG tablet Commonly known as: KEPPRA Take 3 tablets (750 mg total) by mouth in the morning AND 4 tablets (1,000 mg total) at bedtime. Replaces: levETIRAcetam 500 MG 24 hr tablet   levothyroxine 88 MCG tablet Commonly known as: SYNTHROID TAKE 1 TABLET BY MOUTH ONCE A DAY   LORazepam 0.5 MG tablet Commonly known as: ATIVAN Take 0.5 mg by mouth 2 (two) times daily as needed for anxiety.   melatonin 3 MG Tabs tablet Take 3 mg by mouth at bedtime.   nitrofurantoin (macrocrystal-monohydrate) 100 MG capsule Commonly known as: Macrobid Take 1 capsule (100 mg total) by mouth 2 (two) times daily for 3 days.   omeprazole 40 MG capsule Commonly known as: PRILOSEC Take 40 mg by mouth daily.   QUEtiapine 25 MG tablet Commonly known as: SEROquel Take 1 tablet (25 mg total) by mouth at bedtime. What changed: when to take this   sertraline 100 MG tablet Commonly known as: ZOLOFT Take 100 mg by mouth daily.       Consultations:  Neurology  Procedures/Studies:   CT Head Wo Contrast  Result Date: 04/19/2020 CLINICAL DATA:  Seizure lasting about 5 minutes. EXAM: CT HEAD WITHOUT CONTRAST TECHNIQUE: Contiguous axial images were obtained from the base of the skull through the vertex without intravenous contrast. COMPARISON:  02/29/2020 FINDINGS: Brain: Age related atrophy. Minimal chronic small-vessel change of the deep white matter. No sign of acute infarction, mass lesion, hemorrhage,  hydrocephalus or extra-axial collection. Vascular: There is atherosclerotic calcification of the major vessels at the base of the brain. Skull: Negative Sinuses/Orbits: Clear/normal Other: None IMPRESSION: No acute or reversible finding. Age related atrophy. Minimal chronic small-vessel change of the deep white matter. Electronically Signed   By: Nelson Chimes M.D.   On: 04/19/2020 19:10   DG Chest Port 1 View  Result Date: 04/19/2020 CLINICAL DATA:  Loss of consciousness.  Seizure. EXAM: PORTABLE CHEST 1 VIEW COMPARISON:  02/29/2020 FINDINGS: Heart size is normal. Mediastinal shadows are normal. The lungs are clear. No infiltrate, collapse or effusion. No heart failure. No significant bone finding. IMPRESSION: No active disease. Electronically Signed   By: Nelson Chimes M.D.   On: 04/19/2020 19:08        The results of significant diagnostics from this hospitalization (including imaging, microbiology, ancillary and laboratory) are listed below for reference.     Microbiology: Recent Results (from the past 240 hour(s))  Resp Panel by RT-PCR (Flu A&B, Covid) Nasopharyngeal Swab     Status: None   Collection Time: 04/19/20 11:21  PM   Specimen: Nasopharyngeal Swab; Nasopharyngeal(NP) swabs in vial transport medium  Result Value Ref Range Status   SARS Coronavirus 2 by RT PCR NEGATIVE NEGATIVE Final    Comment: (NOTE) SARS-CoV-2 target nucleic acids are NOT DETECTED.  The SARS-CoV-2 RNA is generally detectable in upper respiratory specimens during the acute phase of infection. The lowest concentration of SARS-CoV-2 viral copies this assay can detect is 138 copies/mL. A negative result does not preclude SARS-Cov-2 infection and should not be used as the sole basis for treatment or other patient management decisions. A negative result may occur with  improper specimen collection/handling, submission of specimen other than nasopharyngeal swab, presence of viral mutation(s) within the areas  targeted by this assay, and inadequate number of viral copies(<138 copies/mL). A negative result must be combined with clinical observations, patient history, and epidemiological information. The expected result is Negative.  Fact Sheet for Patients:  EntrepreneurPulse.com.au  Fact Sheet for Healthcare Providers:  IncredibleEmployment.be  This test is no t yet approved or cleared by the [place] FDA and  has been authorized for detection and/or diagnosis of SARS-CoV-2 by FDA under an Emergency Use Authorization (EUA). This EUA will remain  in effect (meaning this test can be used) for the duration of the COVID-19 declaration under Section 564(b)(1) of the Act, 21 U.S.C.section 360bbb-3(b)(1), unless the authorization is terminated  or revoked sooner.       Influenza A by PCR NEGATIVE NEGATIVE Final   Influenza B by PCR NEGATIVE NEGATIVE Final    Comment: (NOTE) The Xpert Xpress SARS-CoV-2/FLU/RSV plus assay is intended as an aid in the diagnosis of influenza from Nasopharyngeal swab specimens and should not be used as a sole basis for treatment. Nasal washings and aspirates are unacceptable for Xpert Xpress SARS-CoV-2/FLU/RSV testing.  Fact Sheet for Patients: EntrepreneurPulse.com.au  Fact Sheet for Healthcare Providers: IncredibleEmployment.be  This test is not yet approved or cleared by the [place] FDA and has been authorized for detection and/or diagnosis of SARS-CoV-2 by FDA under an Emergency Use Authorization (EUA). This EUA will remain in effect (meaning this test can be used) for the duration of the COVID-19 declaration under Section 564(b)(1) of the Act, 21 U.S.C. section 360bbb-3(b)(1), unless the authorization is terminated or revoked.  Performed at [facility], [place] [address], [place], [place] [address]   Culture, Urine     Status: Abnormal   Collection Time: 04/20/20   6:29 AM   Specimen: Urine, Random  Result Value Ref Range Status   Specimen Description URINE, RANDOM  Final   Special Requests   Final    NONE Performed at [facility], [place] [address] [place], [place] [address]    Culture (A)  Final    >=100,000 COLONIES/mL ESCHERICHIA COLI Confirmed Extended Spectrum Beta-Lactamase Producer (ESBL).  In bloodstream infections from ESBL organisms, carbapenems are preferred over piperacillin/tazobactam. They are shown to have a lower risk of mortality.    Report Status 04/22/2020 FINAL  Final   Organism ID, Bacteria ESCHERICHIA COLI (A)  Final      Susceptibility   Escherichia coli - MIC*    AMPICILLIN >=32 RESISTANT Resistant     CEFAZOLIN RESISTANT Resistant     CEFEPIME <=0.12 SENSITIVE Sensitive     CEFTRIAXONE 8 RESISTANT Resistant     CIPROFLOXACIN >=4 RESISTANT Resistant     GENTAMICIN <=1 SENSITIVE Sensitive     IMIPENEM <=0.25 SENSITIVE Sensitive     NITROFURANTOIN <=16 SENSITIVE Sensitive  TRIMETH/SULFA >=320 RESISTANT Resistant     AMPICILLIN/SULBACTAM <=2 SENSITIVE Sensitive     PIP/TAZO <=4 SENSITIVE Sensitive     * >=100,000 COLONIES/mL ESCHERICHIA COLI  Resp Panel by RT-PCR (Flu A&B, Covid) Nasopharyngeal Swab     Status: None   Collection Time: 04/22/20  8:05 AM   Specimen: Nasopharyngeal Swab; Nasopharyngeal(NP) swabs in vial transport medium  Result Value Ref Range Status   SARS Coronavirus 2 by RT PCR NEGATIVE NEGATIVE Final    Comment: (NOTE) SARS-CoV-2 target nucleic acids are NOT DETECTED.  The SARS-CoV-2 RNA is generally detectable in upper respiratory specimens during the acute phase of infection. The lowest concentration of SARS-CoV-2 viral copies this assay can detect is 138 copies/mL. A negative result does not preclude SARS-Cov-2 infection and should not be used as the sole basis for treatment or other patient management decisions. A negative result may occur with  improper specimen collection/handling,  submission of specimen other than nasopharyngeal swab, presence of viral mutation(s) within the areas targeted by this assay, and inadequate number of viral copies(<138 copies/mL). A negative result must be combined with clinical observations, patient history, and epidemiological information. The expected result is Negative.  Fact Sheet for Patients:  EntrepreneurPulse.com.au  Fact Sheet for Healthcare Providers:  IncredibleEmployment.be  This test is no t yet approved or cleared by the [place] FDA and  has been authorized for detection and/or diagnosis of SARS-CoV-2 by FDA under an Emergency Use Authorization (EUA). This EUA will remain  in effect (meaning this test can be used) for the duration of the COVID-19 declaration under Section 564(b)(1) of the Act, 21 U.S.C.section 360bbb-3(b)(1), unless the authorization is terminated  or revoked sooner.       Influenza A by PCR NEGATIVE NEGATIVE Final   Influenza B by PCR NEGATIVE NEGATIVE Final    Comment: (NOTE) The Xpert Xpress SARS-CoV-2/FLU/RSV plus assay is intended as an aid in the diagnosis of influenza from Nasopharyngeal swab specimens and should not be used as a sole basis for treatment. Nasal washings and aspirates are unacceptable for Xpert Xpress SARS-CoV-2/FLU/RSV testing.  Fact Sheet for Patients: EntrepreneurPulse.com.au  Fact Sheet for Healthcare Providers: IncredibleEmployment.be  This test is not yet approved or cleared by the [place] FDA and has been authorized for detection and/or diagnosis of SARS-CoV-2 by FDA under an Emergency Use Authorization (EUA). This EUA will remain in effect (meaning this test can be used) for the duration of the COVID-19 declaration under Section 564(b)(1) of the Act, 21 U.S.C. section 360bbb-3(b)(1), unless the authorization is terminated or revoked.  Performed at [facility], [place] [address] [place], [place] [address]      Labs: BNP (last 3 results) No results for input(s): BNP in the last 8760 hours. Basic Metabolic Panel: Recent Labs  Lab 04/19/20 1757 04/20/20 0427  NA 138 140  K 4.0 3.8  CL 101 103  CO2 22 26  GLUCOSE 143* 80  BUN 21 20  CREATININE 0.77 0.68  CALCIUM 9.4 9.1   Liver Function Tests: No results for input(s): AST, ALT, ALKPHOS, BILITOT, PROT, ALBUMIN in the last 168 hours. No results for input(s): LIPASE, AMYLASE in the last 168 hours. No results for input(s): AMMONIA in the last 168 hours. CBC: Recent Labs  Lab 04/19/20 1757 04/20/20 0427  WBC 13.1* 10.4  HGB 12.5 11.0*  HCT 39.8 34.7*  MCV 88.4 88.7  PLT 209 180   Cardiac Enzymes: No results for input(s): CKTOTAL, CKMB, CKMBINDEX, TROPONINI  in the last 168 hours. BNP: Invalid input(s): POCBNP CBG: Recent Labs  Lab 04/21/20 0623 04/21/20 1154 04/21/20 1606 04/21/20 2139 04/22/20 0631  GLUCAP 75 119* 95 87 99   D-Dimer No results for input(s): DDIMER in the last 72 hours. Hgb A1c No results for input(s): HGBA1C in the last 72 hours. Lipid Profile No results for input(s): CHOL, HDL, LDLCALC, TRIG, CHOLHDL, LDLDIRECT in the last 72 hours. Thyroid function studies Recent Labs    04/20/20 1643  TSH 0.583   Anemia work up Recent Labs    04/21/20 0336  VITAMINB12 189  FOLATE 8.7  FERRITIN 111  TIBC 270  IRON 46  RETICCTPCT 0.7   Urinalysis    Component Value Date/Time   COLORURINE YELLOW 04/20/2020 0008   APPEARANCEUR HAZY (A) 04/20/2020 0008   LABSPEC 1.020 04/20/2020 0008   PHURINE 6.0 04/20/2020 0008   GLUCOSEU NEGATIVE 04/20/2020 0008   HGBUR NEGATIVE 04/20/2020 0008   BILIRUBINUR NEGATIVE 04/20/2020 0008   BILIRUBINUR negative 06/10/2012 1631   KETONESUR NEGATIVE 04/20/2020 0008   PROTEINUR NEGATIVE 04/20/2020 0008   UROBILINOGEN negative 06/10/2012 1631   NITRITE POSITIVE (A) 04/20/2020 0008   LEUKOCYTESUR TRACE (A) 04/20/2020 0008   Sepsis  Labs Invalid input(s): PROCALCITONIN,  WBC,  LACTICIDVEN   Time coordinating discharge: 35 minutes  SIGNED:  Mercy Riding, MD  [facility] 04/22/2020, 10:55 AM  If 7PM-7AM, please contact night-coverage [email or web address]

## 2020-04-22 NOTE — ED Triage Notes (Signed)
Presents via EMS with c/o fall from wheelchair. Per EMS pt stood up from wheelchair and fell forward landing onher knees then her face. Has small laceration noted to left eyebrow and abrasions noted to both knees. Pt is not on any blood thinners.

## 2020-04-22 NOTE — Discharge Instructions (Addendum)
The CT scan of Stacy Davis's brain did not show signs of internal bleeding or skull fracture.  The small laceration to her left eyebrow was too small for suture closure. I put dermabond glue on the wound.  It should naturally dissolve in 1-2 weeks.    I did not feel clinically that this injury would be consistent with knee fractures.   *  I attempted to reach the patient's son Stacy Davis by phone at his work and cell phone number to update him, but was able to contact him.

## 2020-04-22 NOTE — TOC Transition Note (Signed)
Transition of Care [facility]) - CM/SW Discharge Note   Patient Details  Name: Stacy Davis MRN: [id number] Date of Birth: 03/03/1946  [facility]) CM/SW Contact:  Coralee Pesa, [place] Phone Number: 04/22/2020, 10:43 AM   Clinical Narrative:    Rm # [phone] Call to report is [phone]   Final next level of care: Memory Care Barriers to Discharge: Barriers Resolved, SNF Pending discharge summary   Patient Goals and CMS Choice Patient states their goals for this hospitalization and ongoing recovery are:: Pt's family agreeable for her to return to [facility].gov Compare Post Acute Care list provided to:: Patient Represenative (must comment) Choice offered to / list presented to : Adult Children  Discharge Placement              Patient chooses bed at:  [facility]) Patient to be transferred to facility by: [facility] Name of family member notified: Merry Proud Patient and family notified of of transfer: 04/22/20  Discharge Plan and Services     Post Acute Care Choice: [place]                               Social Determinants of Health (SDOH) Interventions     Readmission Risk Interventions No flowsheet data found.

## 2020-04-22 NOTE — Progress Notes (Signed)
Pt's IV removed; site is clean, dry and intact. Discharge instructions were reviewed with pt. DNR form and hard script placed in packet. Report was called and given to [facility], nurse at [facility]. Personal belongings were packed by staff and sent with pt. Pt. transported via stretcher by [facility] to receiving facility.

## 2020-04-22 NOTE — ED Notes (Signed)
Pt to CT via stretcher

## 2020-05-09 DIAGNOSIS — M6281 Muscle weakness (generalized): Secondary | ICD-10-CM | POA: Diagnosis not present

## 2020-05-15 DIAGNOSIS — F419 Anxiety disorder, unspecified: Secondary | ICD-10-CM | POA: Diagnosis not present

## 2020-05-15 DIAGNOSIS — F0391 Unspecified dementia with behavioral disturbance: Secondary | ICD-10-CM | POA: Diagnosis not present

## 2020-05-15 DIAGNOSIS — F5102 Adjustment insomnia: Secondary | ICD-10-CM | POA: Diagnosis not present

## 2020-05-15 DIAGNOSIS — R451 Restlessness and agitation: Secondary | ICD-10-CM | POA: Diagnosis not present

## 2020-05-15 DIAGNOSIS — R454 Irritability and anger: Secondary | ICD-10-CM | POA: Diagnosis not present

## 2020-05-30 DIAGNOSIS — Z20828 Contact with and (suspected) exposure to other viral communicable diseases: Secondary | ICD-10-CM | POA: Diagnosis not present

## 2020-06-08 DIAGNOSIS — F5102 Adjustment insomnia: Secondary | ICD-10-CM | POA: Diagnosis not present

## 2020-06-08 DIAGNOSIS — F419 Anxiety disorder, unspecified: Secondary | ICD-10-CM | POA: Diagnosis not present

## 2020-06-08 DIAGNOSIS — F0391 Unspecified dementia with behavioral disturbance: Secondary | ICD-10-CM | POA: Diagnosis not present

## 2020-06-08 DIAGNOSIS — R454 Irritability and anger: Secondary | ICD-10-CM | POA: Diagnosis not present

## 2020-06-08 DIAGNOSIS — R451 Restlessness and agitation: Secondary | ICD-10-CM | POA: Diagnosis not present

## 2020-06-09 DIAGNOSIS — M6281 Muscle weakness (generalized): Secondary | ICD-10-CM | POA: Diagnosis not present

## 2020-07-05 ENCOUNTER — Encounter: Payer: Self-pay | Admitting: Neurology

## 2020-07-05 ENCOUNTER — Ambulatory Visit: Payer: PPO | Admitting: Neurology

## 2020-07-05 NOTE — Progress Notes (Deleted)
PATIENT: Stacy Davis DOB: 09-Dec-1945  REASON FOR VISIT: follow up HISTORY FROM: patient  HISTORY OF PRESENT ILLNESS: Today 07/05/20  HISTORY Stacy Davis a 75 years old female, seen in request byher primary care physician Dr.Pickard, [place] evaluation[place] worsening dementia, seizure, She is with her husband Stacy Davis at today's visit on July 29, 2018.  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  I reviewed hospital discharge on June 16, 2018,shewas admitted for collapse, similar presentation in August 2019, according to husband, total of 3 episodes, sudden loss of consciousness,generalized tonic-clonic seizure,suffered a fall, first episode in early 2019, was walking around the bed suddenly collapsed with drooling from the mouth jerking movement in all extremities, no bowel and bladder incontinence, second episode in August, collapsed to the kitchen floor, husband found her jerking, extremity movement, third episode 2 AM on June 15, 2018, was found on the edge of the bed, with seizure activities.patient appearedto have breathing difficulty,  required bagging from paramedic, confused, then she was able to back to her baseline, she had no recollection of the fall,   EEG is consistent with an area of potential epileptogenicity in the right frontotemporal region. There was no seizure recorded on this study.  She was started on Keppra 500 mg twice daily, tolerating it well,  I personally reviewed MRI of the brain without contrast June 14, 2018: No acute abnormality, moderate supratentorium small vessel disease.  Laboratory evaluations January 2020 showed TSH, CMP, CBC, A1c was 6.0,  UPDATE May 05 2019: She was admitted to the hospital on April 26, 2019, had sudden onset loss of consciousness, patient has not been compliant with her medications, but since ED visit, her family was able to convince her to take her medication, she has no recurrent spells  I personally reviewed CT head without contrast April 26, 2019, no acute abnormality, mild supratentorium small vessel disease  Laboratory evaluation showed normal CBC, hepatic function, BMP showed K 3.2  UPDATE Jul 05 2019: She has no recurrent seizure, tolerating Keppra XR 500 mg 2 tablets at bedtime, also Seroquel 25 mg at bedtime, she sleeps well, but no longer has good appetite, walks in her house all day long, denies significant gait abnormality, she is not taking Zoloft regularly  Mini-Mental Status Examination was only 3 out of 30 in October 2020   Update June 04, 2020 SS: She was in the ER for seizure in May, June 2021. In June Keppra increased by adding in 500 to her home regimen.  Has been in the ER multiple times for falls. Hospitalized in August 2021 after a fall suffering right-sided comminuted nondisplaced fracture of the right greater trochanter and right 10th and 11th rib.  Age-indeterminate compression fracture  of L3 and L4.  No surgical intervention.  Hospitalized in November 2021 for breakthrough seizure, per facility lasted about 5 minutes.  CT head  showed no significant findings.  Keppra increased 750/1000. REVIEW OF SYSTEMS: Out of a complete 14 system review of symptoms, the patient complains only of the following symptoms, and all other reviewed systems are negative.  ALLERGIES: Allergies  Allergen Reactions  . Tramadol Other (See Comments)    CHANGES IN MEMORY    HOME MEDICATIONS: Outpatient Medications Prior to Visit  Medication Sig Dispense Refill  . acetaminophen (TYLENOL) 325 MG tablet Take 650 mg by mouth every 6 (six) hours as needed for mild pain.    Marland Kitchen aspirin (ASPIRIN 81) 81 MG chewable tablet Chew 81 mg by mouth daily.    . busPIRone (BUSPAR) 5 MG tablet Take 5 mg by mouth 2 (two) times daily.     . feeding supplement (ENSURE IMMUNE HEALTH) LIQD Take 237 mLs by mouth in the morning, at noon, and at bedtime.    . levETIRAcetam (KEPPRA) 250 MG tablet Take 3 tablets (750 mg total) by mouth in the morning AND 4 tablets (1,000 mg total) at bedtime. 210 tablet 11  . levothyroxine (SYNTHROID) 88 MCG tablet TAKE 1 TABLET BY MOUTH ONCE A DAY (Patient taking differently: Take 88 mcg by mouth daily. ) 30 tablet 3  . LORazepam (ATIVAN) 0.5 MG tablet Take 0.5 mg by mouth 2 (two) times daily as needed for anxiety.    . melatonin 3 MG TABS tablet Take 3 mg by mouth at bedtime.    Marland Kitchen omeprazole (PRILOSEC) 40 MG capsule Take 40 mg by mouth daily.    . QUEtiapine (SEROQUEL) 25 MG tablet Take 1 tablet (25 mg total) by mouth at bedtime. (Patient taking differently: Take 25 mg by mouth every evening. ) 90 tablet 4  . sertraline (ZOLOFT) 100 MG tablet Take 100 mg by mouth daily.    . Sunscreens (COPPERTONE SPORT SPF30) LOTN Apply 1 application topically every 2 (two) hours as needed (While in the sun).     No facility-administered medications prior to visit.    PAST MEDICAL HISTORY: Past Medical History:  Diagnosis Date  . Anxiety   . Arthritis    ?OA vs Rheum (established with Dr. Jefm Bryant pending blood work)  . Articular cartilage  disorder of shoulder 04/2012   right  . Back pain    h/o DDD since 2010  . Basal cell carcinoma    right dorsum nose, right forhead  . Dementia ([place])   . Dementia ([place])   . Dental crowns present    also dental caps  . Diabetes mellitus    NIDDM  . Fecal incontinence   . GERD (gastroesophageal reflux disease)   . Headache(784.0)    occasional  . History of skin cancer   . Hyperlipidemia    no current med.  . Hypertension   . Hyperthyroidism   . Hypothyroid   . Impingement syndrome of right shoulder 04/2012  . Memory impairment    dementia- worsenign 12/2016 with medication non compliance and delusions.    . Orthostatic dizziness   . Right rotator cuff tear 05/15/2012  . Rotator cuff rupture 04/2012   right  . Seizures ([place])   . 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 Von Willebrand disease ([place])     PAST SURGICAL HISTORY: Past Surgical History:  Procedure Laterality Date  . APPENDECTOMY    . BREAST  SURGERY     hematoma evacuation due to trauma  . CARDIAC CATHETERIZATION  03/06/2001  . CESAREAN SECTION     x 2  . COLONOSCOPY  01/20/2006 per patient  . FRACTURE SURGERY    . KNEE ARTHROSCOPY Right 12/01/2014   Procedure: ARTHROSCOPY KNEE,partial medial menisectomy and plica excision;  Surgeon: Hessie Knows, MD;  Location: [facility];  Service: Orthopedics;  Laterality: Right;  . LUMBAR LAMINECTOMY/DECOMPRESSION MICRODISCECTOMY  03/01/2009   right L4-5; arthrodesis L4-5  . ORIF WRIST FRACTURE     left  . SHOULDER ARTHROSCOPY WITH ROTATOR CUFF REPAIR AND SUBACROMIAL DECOMPRESSION  05/15/2012   Procedure: SHOULDER ARTHROSCOPY WITH ROTATOR CUFF REPAIR AND SUBACROMIAL DECOMPRESSION;  Surgeon: Johnny Bridge, MD;  Location: [facility];  Service: Orthopedics;  Laterality: Right;  RIGHT ARTHROSCOPY SHOULDER DEBRIDEMENT LIMITED, ARTHROSCOPY SHOULDER DECOMPRESSION SUBACROMIAL PARTIAL ACROMIOPLASTY WITH CORACOACROMIAL RELEASE, ARTHROSCOPY  SHOULDER WITH ROTATOR CUFF REPAIR  . TUBAL LIGATION      FAMILY HISTORY: Family History  Problem Relation Age of Onset  . Dementia Mother   . Arthritis Mother   . Cancer Father        unknown primary  . Heart disease Father   . Thyroid disease Sister   . Cancer Sister        brain tumor  . Dementia Sister   . COPD Brother   . Cancer Brother        bone cancer  . Colon cancer Paternal Grandfather   . Breast cancer Neg Hx     SOCIAL HISTORY: Social History   Socioeconomic History  . Marital status: Married    Spouse name: Stacy Davis  . Number of children: 2  . Years of education: 71  . Highest education level: Not on file  Occupational History  . Occupation: Retired    Comment: retired  Tobacco Use  . Smoking status: Never Smoker  . Smokeless tobacco: Never Used  Vaping Use  . Vaping Use: Never used  Substance and Sexual Activity  . Alcohol use: No    Alcohol/week: 0.0 standard drinks  . Drug use: No  . Sexual activity: Not Currently    Birth control/protection: Surgical  Other Topics Concern  . Not on file  Social History Narrative   Patient lives at home with her husband Stacy Davis).   Patient is retired .   Education high school.   Right handed.   Caffeine sweet tea two cups.      Social Determinants of Health   Financial Resource Strain: Not on file  Food Insecurity: Not on file  Transportation Needs: Not on file  Physical Activity: Not on file  Stress: Not on file  Social Connections: Not on file  Intimate Partner Violence: Not on file      PHYSICAL EXAM  There were no vitals filed for this visit. There is no height or weight on file to calculate BMI.  Generalized: Well developed, in no acute distress   Neurological examination  Mentation: Alert oriented to time, place, history taking. Follows all commands speech and language fluent Cranial nerve II-XII: Pupils were equal round reactive to light. Extraocular movements were full, visual field  were full on confrontational test. Facial sensation and strength were normal. Uvula tongue midline. Head turning and shoulder shrug  were normal and symmetric. Motor: The motor testing reveals 5 over 5 strength of all 4 extremities. Good symmetric motor tone is noted throughout.  Sensory: Sensory testing is intact to soft touch on all 4  extremities. No evidence of extinction is noted.  Coordination: Cerebellar testing reveals good finger-nose-finger and heel-to-shin bilaterally.  Gait and station: Gait is normal. Tandem gait is normal. Romberg is negative. No drift is seen.  Reflexes: Deep tendon reflexes are symmetric and normal bilaterally.   DIAGNOSTIC DATA (LABS, IMAGING, TESTING) - I reviewed patient records, labs, notes, testing and imaging myself where available.  Lab Results  Component Value Date   WBC 10.4 04/20/2020   HGB 11.0 (L) 04/20/2020   HCT 34.7 (L) 04/20/2020   MCV 88.7 04/20/2020   PLT 180 04/20/2020      Component Value Date/Time   NA 140 04/20/2020 0427   K 3.8 04/20/2020 0427   CL 103 04/20/2020 0427   CO2 26 04/20/2020 0427   GLUCOSE 80 04/20/2020 0427   BUN 20 04/20/2020 0427   CREATININE 0.68 04/20/2020 0427   CREATININE 0.92 06/25/2018 1513   CALCIUM 9.1 04/20/2020 0427   PROT 7.0 12/10/2019 1829   ALBUMIN 4.2 12/10/2019 1829   AST 39 12/10/2019 1829   ALT 31 12/10/2019 1829   ALKPHOS 106 12/10/2019 1829   BILITOT 0.7 12/10/2019 1829   GFRNONAA >60 04/20/2020 0427   GFRNONAA 63 06/25/2018 1513   GFRAA >60 01/01/2020 0500   GFRAA 73 06/25/2018 1513   Lab Results  Component Value Date   CHOL 234 (H) 05/18/2015   HDL 57 05/18/2015   LDLCALC 137 (H) 05/18/2015   LDLDIRECT 164.0 07/08/2014   TRIG 199 (H) 05/18/2015   CHOLHDL 4.1 05/18/2015   Lab Results  Component Value Date   HGBA1C 6.0 (H) 06/25/2018   Lab Results  Component Value Date   VITAMINB12 189 04/21/2020   Lab Results  Component Value Date   TSH 0.583 04/20/2020       ASSESSMENT AND PLAN 75 y.o. year old female  has a past medical history of Anxiety, Arthritis, Articular cartilage disorder of shoulder (04/2012), Back pain, Basal cell carcinoma, Dementia ([place]), Dementia ([place]), Dental crowns present, Diabetes mellitus, Fecal incontinence, GERD (gastroesophageal reflux disease), Headache(784.0), History of skin cancer, Hyperlipidemia, Hypertension, Hyperthyroidism, Hypothyroid, Impingement syndrome of right shoulder (04/2012), Memory impairment, Orthostatic dizziness, Right rotator cuff tear (05/15/2012), Rotator cuff rupture (04/2012), Seizures ([place]), Squamous cell carcinoma in situ (11/04/2013), and Von Willebrand disease ([place]). here with:  1.  Dementia -progressive worsening -continue Seroquel 25 mg daily  2.  Seizures   I spent 15 minutes with the patient. 50% of this time was spent   Butler Denmark, [place], DNP 07/05/2020, 5:55 AM [facility] [address] [place], [place] [address] [phone]

## 2020-07-10 DIAGNOSIS — M6281 Muscle weakness (generalized): Secondary | ICD-10-CM | POA: Diagnosis not present

## 2020-08-07 DIAGNOSIS — M6281 Muscle weakness (generalized): Secondary | ICD-10-CM | POA: Diagnosis not present

## 2020-09-07 DIAGNOSIS — M6281 Muscle weakness (generalized): Secondary | ICD-10-CM | POA: Diagnosis not present

## 2020-09-15 DIAGNOSIS — Z79899 Other long term (current) drug therapy: Secondary | ICD-10-CM | POA: Diagnosis not present

## 2020-10-07 DIAGNOSIS — M6281 Muscle weakness (generalized): Secondary | ICD-10-CM | POA: Diagnosis not present

## 2021-02-14 DIAGNOSIS — M79672 Pain in left foot: Secondary | ICD-10-CM | POA: Diagnosis not present

## 2021-02-15 DIAGNOSIS — F419 Anxiety disorder, unspecified: Secondary | ICD-10-CM | POA: Diagnosis not present

## 2021-02-15 DIAGNOSIS — R451 Restlessness and agitation: Secondary | ICD-10-CM | POA: Diagnosis not present

## 2021-02-15 DIAGNOSIS — R454 Irritability and anger: Secondary | ICD-10-CM | POA: Diagnosis not present

## 2021-02-15 DIAGNOSIS — F5102 Adjustment insomnia: Secondary | ICD-10-CM | POA: Diagnosis not present

## 2021-02-15 DIAGNOSIS — F0391 Unspecified dementia with behavioral disturbance: Secondary | ICD-10-CM | POA: Diagnosis not present

## 2021-03-14 DIAGNOSIS — M7981 Nontraumatic hematoma of soft tissue: Secondary | ICD-10-CM | POA: Diagnosis not present

## 2021-03-14 DIAGNOSIS — F419 Anxiety disorder, unspecified: Secondary | ICD-10-CM | POA: Diagnosis not present

## 2021-03-14 DIAGNOSIS — M6281 Muscle weakness (generalized): Secondary | ICD-10-CM | POA: Diagnosis not present

## 2021-03-14 DIAGNOSIS — F03918 Unspecified dementia, unspecified severity, with other behavioral disturbance: Secondary | ICD-10-CM | POA: Diagnosis not present

## 2021-03-14 DIAGNOSIS — R454 Irritability and anger: Secondary | ICD-10-CM | POA: Diagnosis not present

## 2021-03-14 DIAGNOSIS — F0280 Dementia in other diseases classified elsewhere without behavioral disturbance: Secondary | ICD-10-CM | POA: Diagnosis not present

## 2021-03-14 DIAGNOSIS — R451 Restlessness and agitation: Secondary | ICD-10-CM | POA: Diagnosis not present

## 2021-03-14 DIAGNOSIS — F5102 Adjustment insomnia: Secondary | ICD-10-CM | POA: Diagnosis not present

## 2021-03-20 DIAGNOSIS — F5105 Insomnia due to other mental disorder: Secondary | ICD-10-CM | POA: Diagnosis not present

## 2021-03-20 DIAGNOSIS — F419 Anxiety disorder, unspecified: Secondary | ICD-10-CM | POA: Diagnosis not present

## 2021-03-20 DIAGNOSIS — F03918 Unspecified dementia, unspecified severity, with other behavioral disturbance: Secondary | ICD-10-CM | POA: Diagnosis not present

## 2021-03-20 DIAGNOSIS — R451 Restlessness and agitation: Secondary | ICD-10-CM | POA: Diagnosis not present

## 2021-03-20 DIAGNOSIS — F59 Unspecified behavioral syndromes associated with physiological disturbances and physical factors: Secondary | ICD-10-CM | POA: Diagnosis not present

## 2021-05-27 DEATH — deceased

## 2022-03-02 IMAGING — CT CT HEAD W/O CM
3 series · 16 of 47 positions shown, 19 images · non-contrast
Comparison: 04/26/2019

CLINICAL DATA: Seizure

EXAM:
CT HEAD WITHOUT CONTRAST
TECHNIQUE: Contiguous axial images were obtained from the base of the skull
through the vertex without intravenous contrast.

[Series 3: head wo · axial · 0.41mm/px · z∈[-153,-28]mm · 10 of 31 slices shown, 13 images]
[im 3/31  brain]
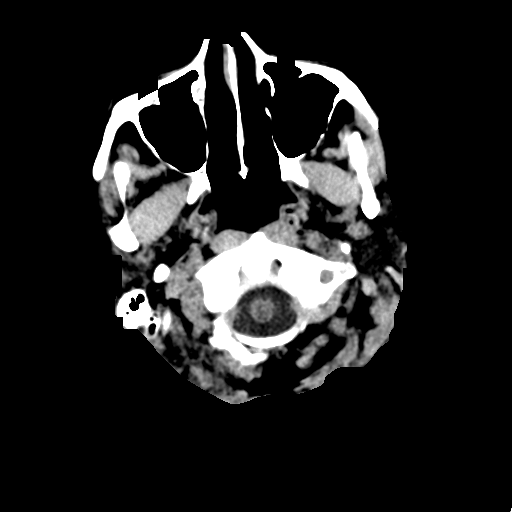
[im 3/31  bone]
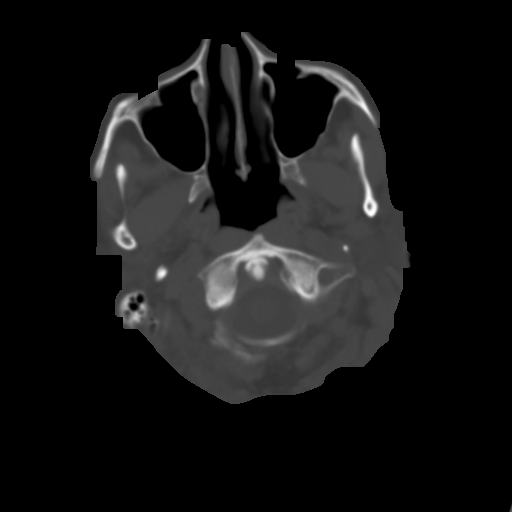
[im 6/31  brain]
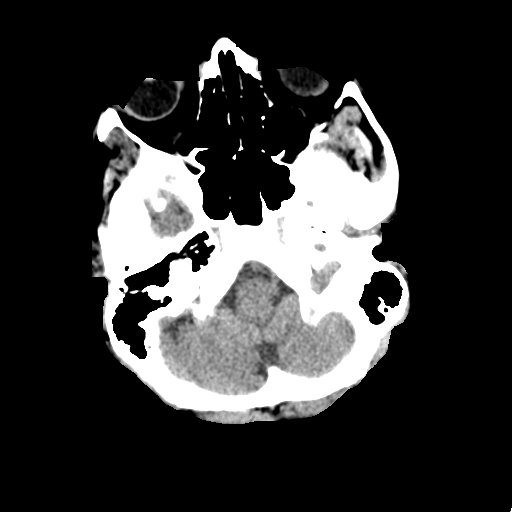
[im 9/31  brain]
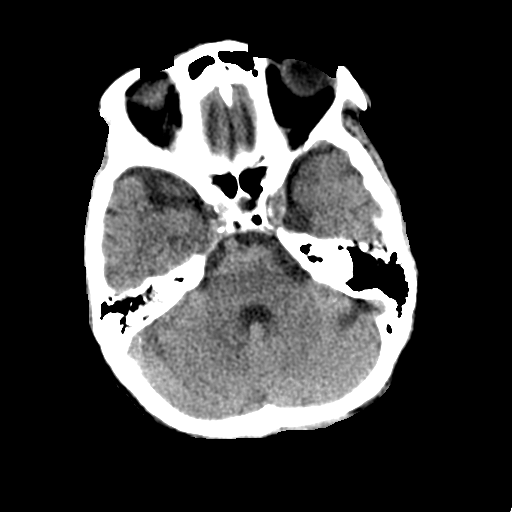
[im 11/31  brain]
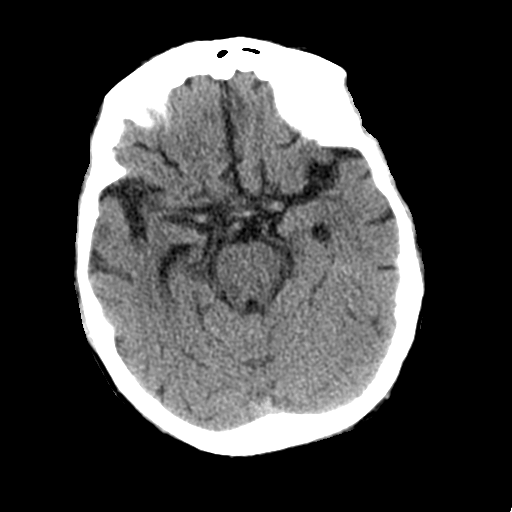
[im 14/31  brain]
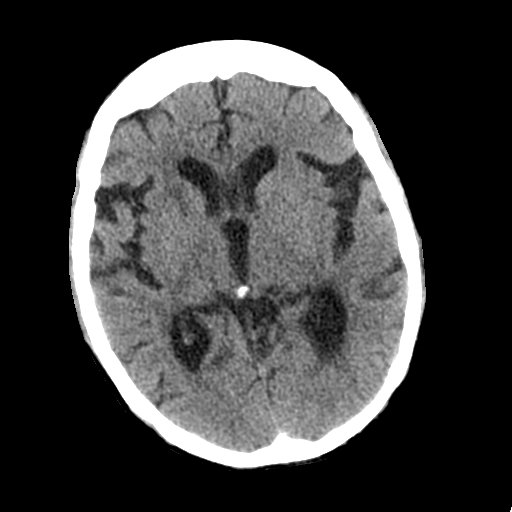
[im 14/31  bone]
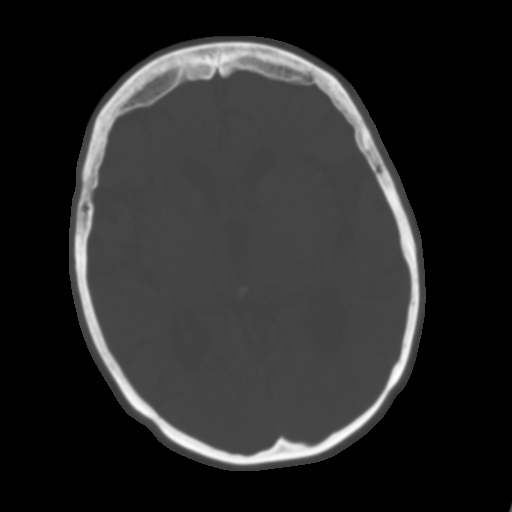
[im 17/31  brain]
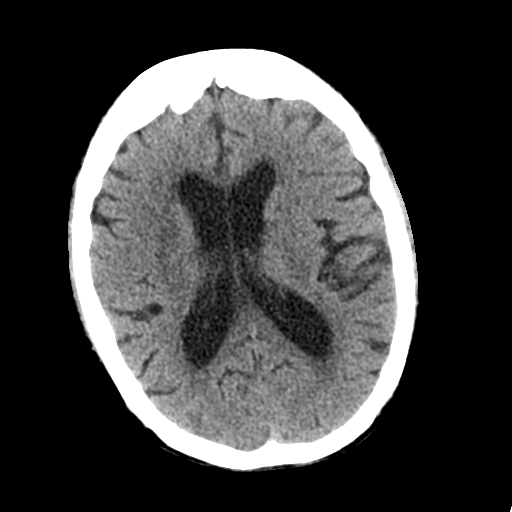
[im 20/31  brain]
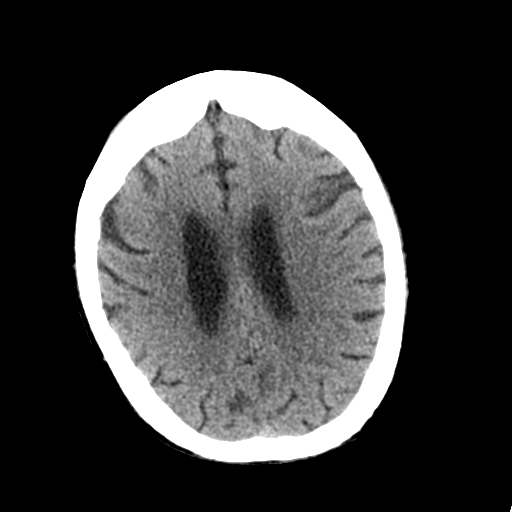
[im 23/31  brain]
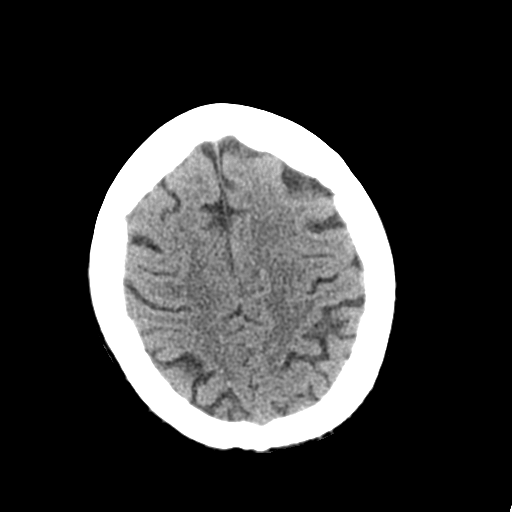
[im 25/31  brain]
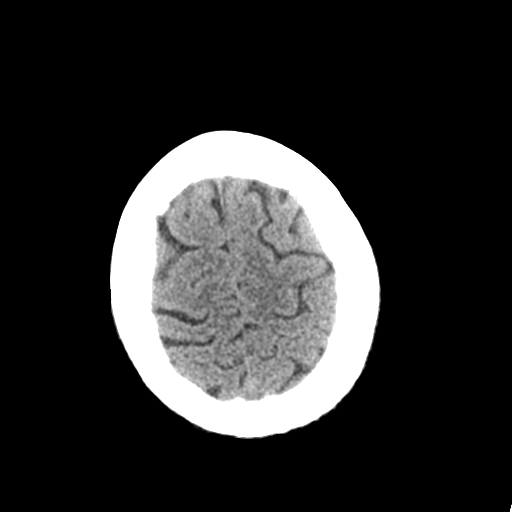
[im 25/31  bone]
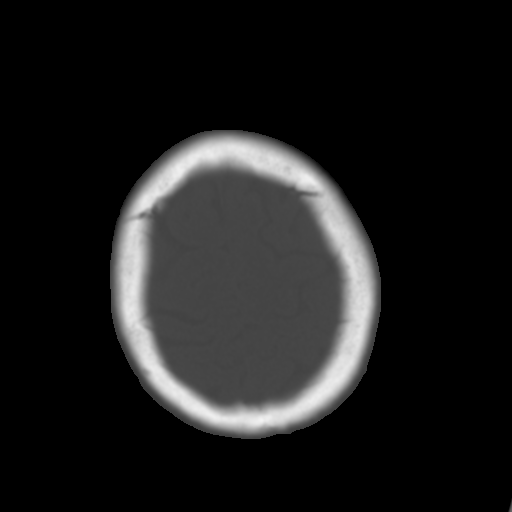
[im 28/31  brain]
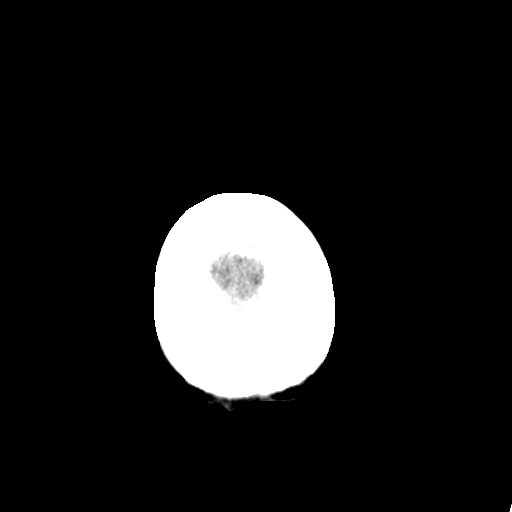

[Series 4: coronal soft tissue · coronal · 0.31mm/px · 3 of 61 slices shown]
[im 21/61  brain]
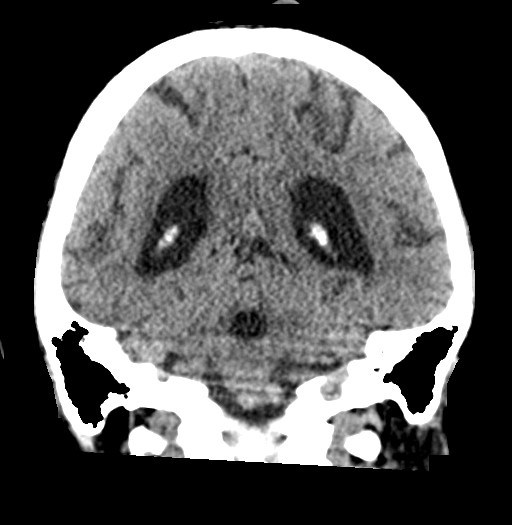
[im 27/61  brain]
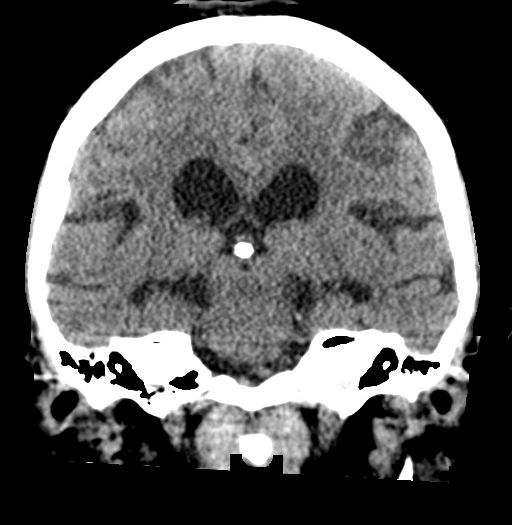
[im 34/61  brain]
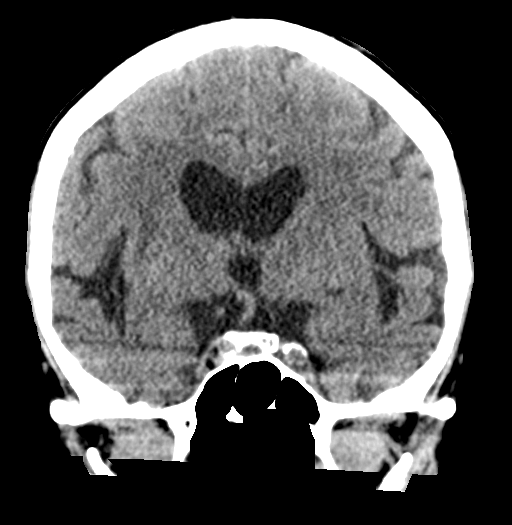

[Series 5: sagittal soft tissue · sagittal · 0.32mm/px · 3 of 53 slices shown]
[im 18/53  brain]
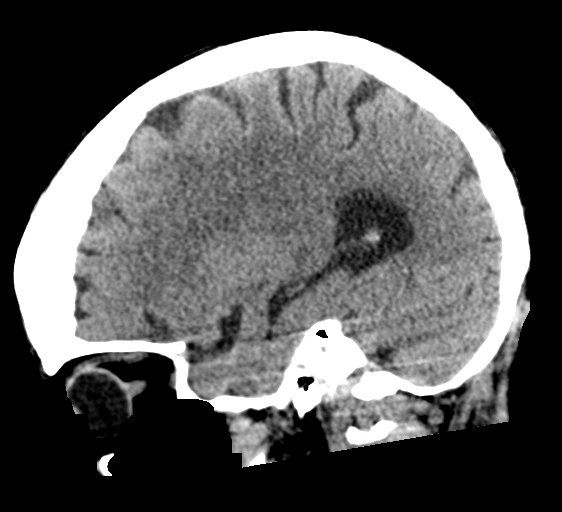
[im 27/53  brain]
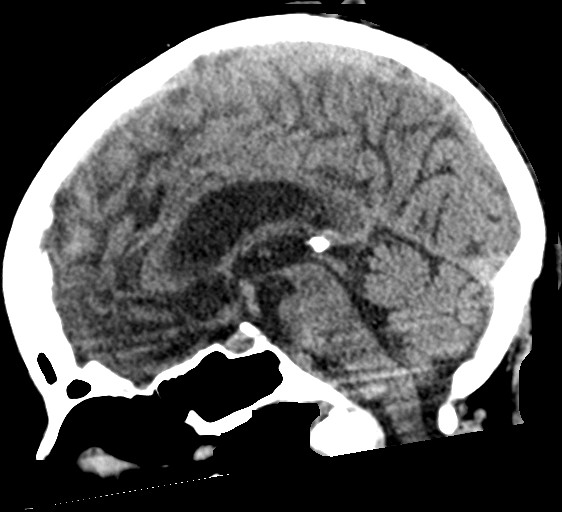
[im 35/53  brain]
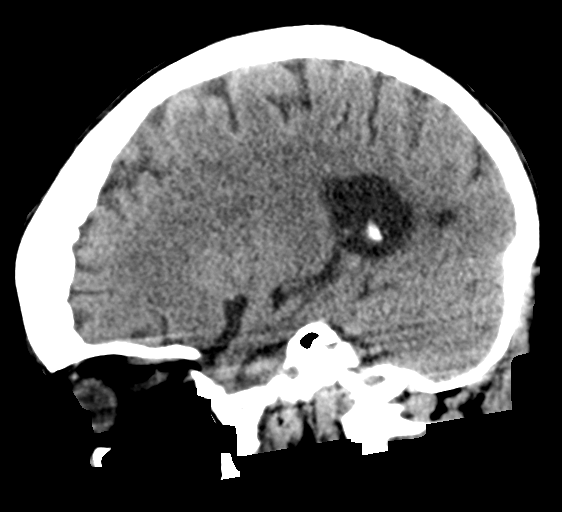

[16 of 47 positions shown; findings below may reference images not displayed]

FINDINGS: Brain: No evidence of acute infarction, hemorrhage, hydrocephalus,
extra-axial collection or mass lesion/mass effect. Mild
periventricular white matter hypodensity.

Vascular: No hyperdense vessel or unexpected calcification.

Skull: Normal. Negative for fracture or focal lesion.

Sinuses/Orbits: No acute finding.

Other: None.
IMPRESSION: No acute intracranial pathology.  Small-vessel white matter disease.

## 2022-03-02 IMAGING — DX DG HAND COMPLETE 3+V*R*
3 series · 3 of 3 positions shown · non-contrast
Comparison: None.

CLINICAL DATA: Fifth digit necrosis

EXAM:
RIGHT HAND - COMPLETE 3+ VIEW

[hand ap]
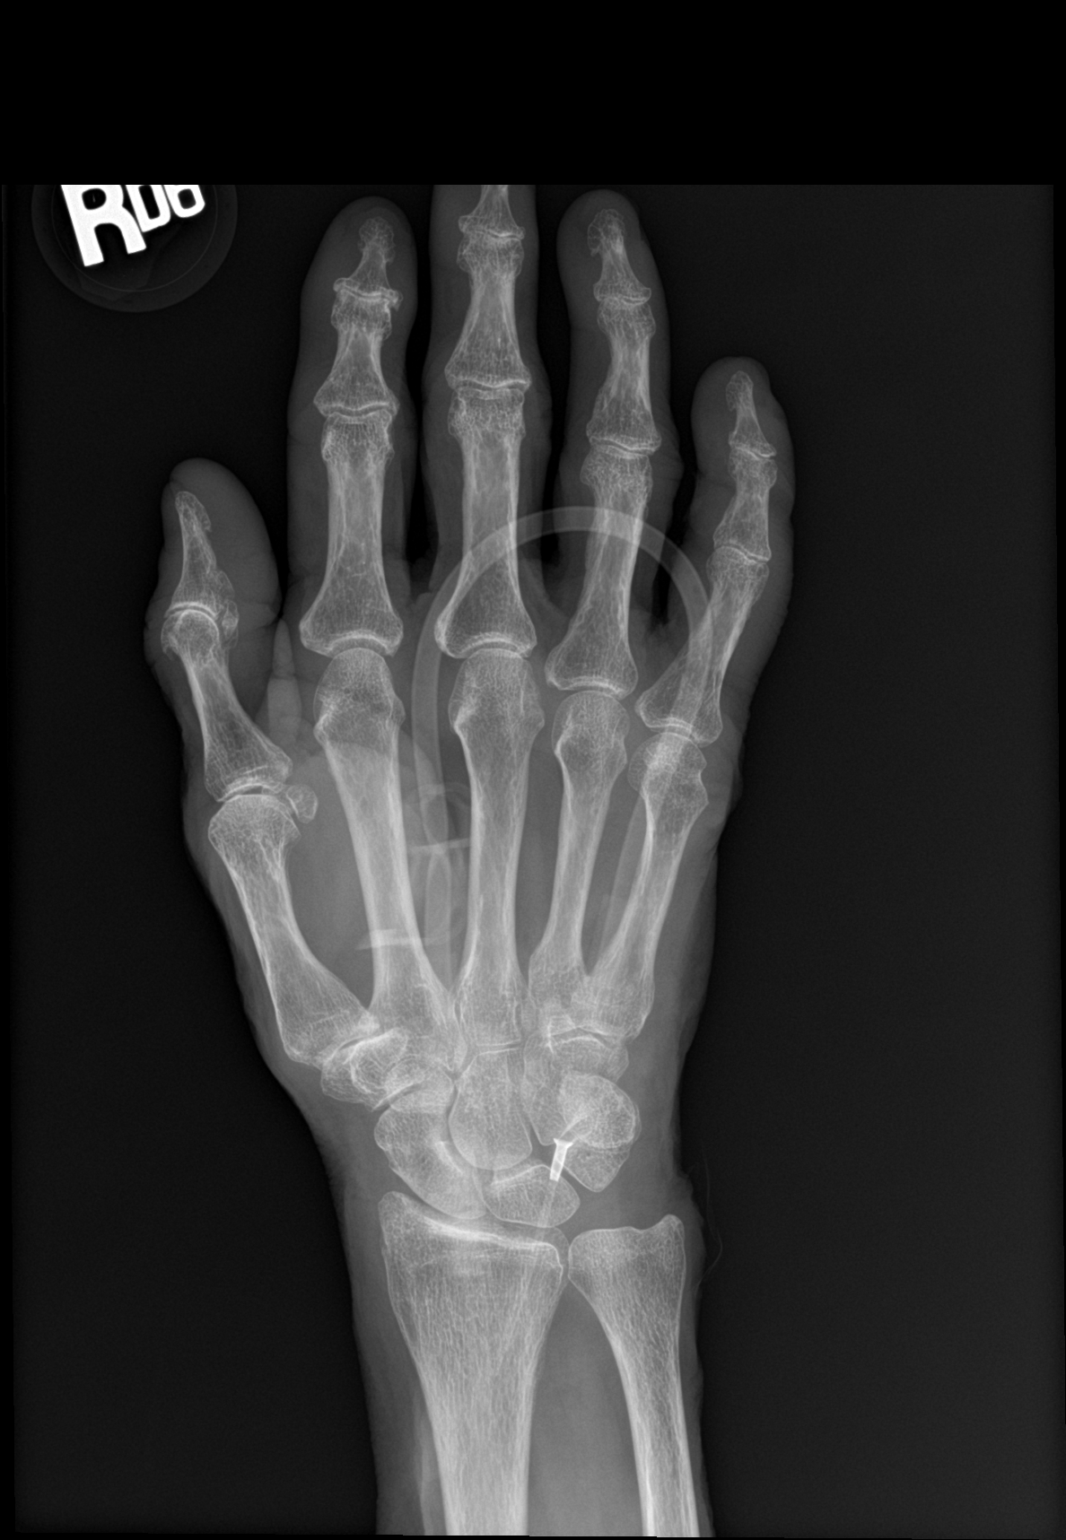

[hand obl]
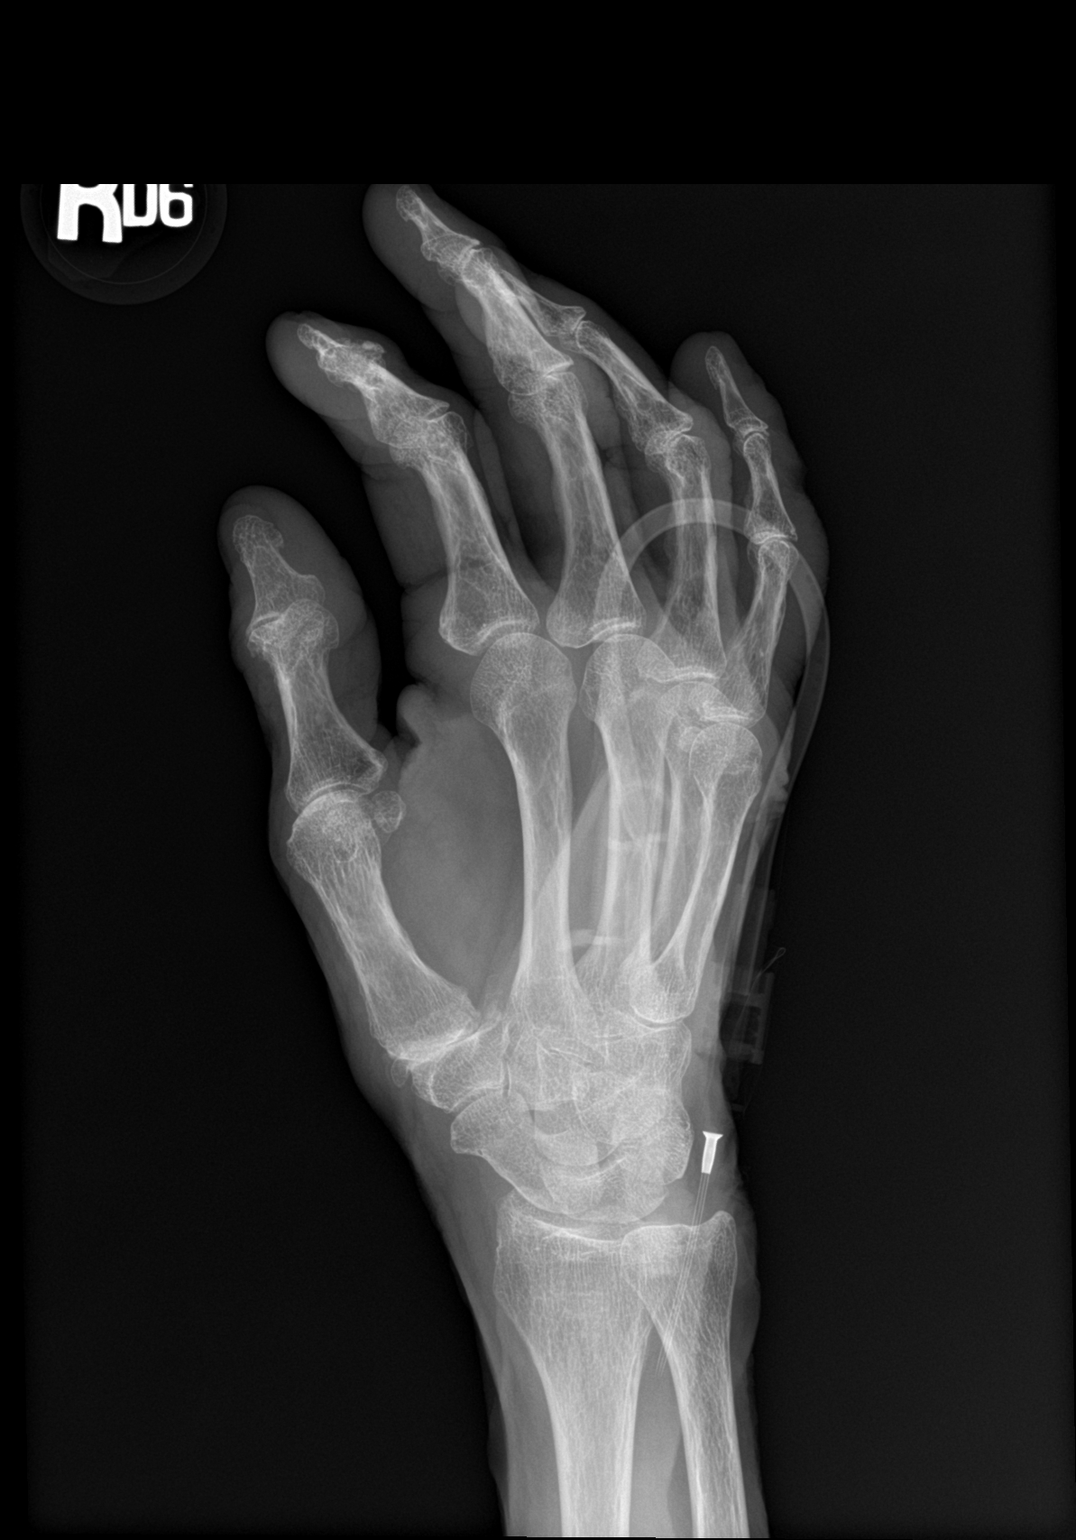

[hand lat]
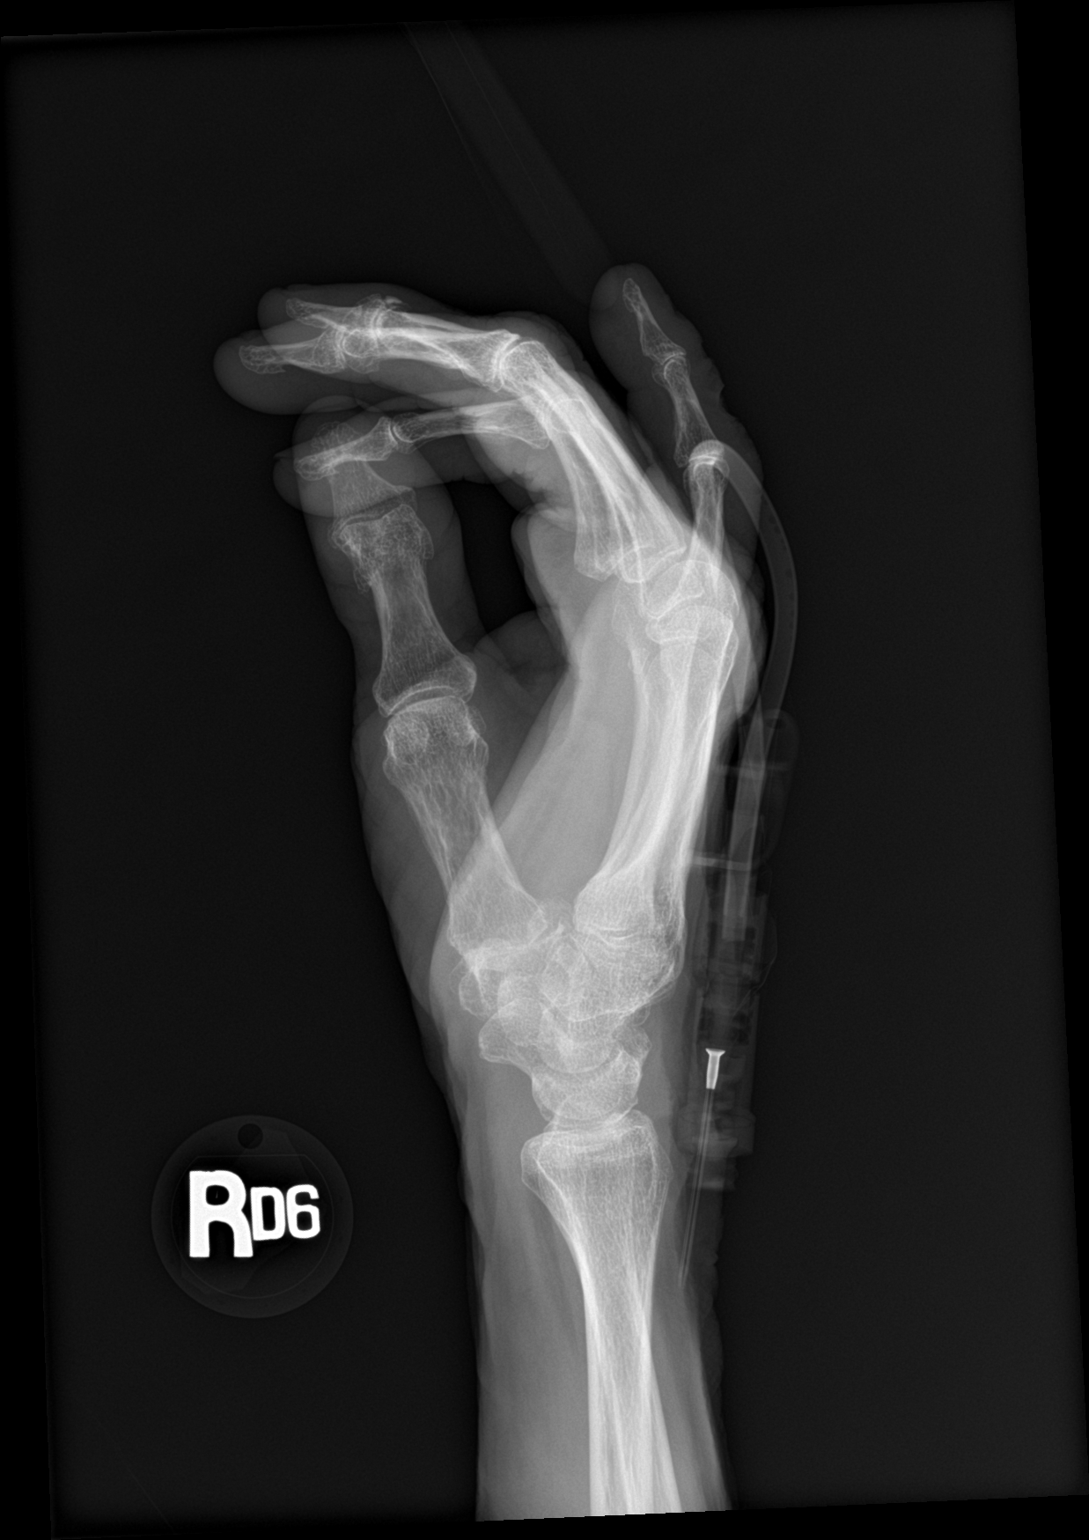

[3 of 3 positions shown; findings below may reference images not displayed]

FINDINGS: No acute fracture or dislocation is noted. No bony erosive changes
are seen to suggest osteomyelitis. Mild soft tissue swelling of the
fifth digit is seen. Degenerative changes of the DIP joints are
noted.
IMPRESSION: Mild degenerative change without acute abnormality.

## 2023-04-17 ENCOUNTER — Encounter: Payer: Self-pay | Admitting: Internal Medicine
